# Patient Record
Sex: Female | Born: 1955 | Race: White | Hispanic: No | Marital: Married
Health system: Northeastern US, Academic
[De-identification: ages and names within clinical notes are randomized; demographics above are authoritative.]

## PROBLEM LIST (undated history)

## (undated) DIAGNOSIS — Z800 Family history of malignant neoplasm of digestive organs: Secondary | ICD-10-CM

## (undated) DIAGNOSIS — G894 Chronic pain syndrome: Secondary | ICD-10-CM

## (undated) DIAGNOSIS — Z8041 Family history of malignant neoplasm of ovary: Secondary | ICD-10-CM

## (undated) DIAGNOSIS — F4310 Post-traumatic stress disorder, unspecified: Secondary | ICD-10-CM

## (undated) DIAGNOSIS — F410 Panic disorder [episodic paroxysmal anxiety] without agoraphobia: Secondary | ICD-10-CM

## (undated) DIAGNOSIS — J439 Emphysema, unspecified: Secondary | ICD-10-CM

## (undated) DIAGNOSIS — F339 Major depressive disorder, recurrent, unspecified: Secondary | ICD-10-CM

## (undated) DIAGNOSIS — L409 Psoriasis, unspecified: Secondary | ICD-10-CM

## (undated) DIAGNOSIS — H2510 Age-related nuclear cataract, unspecified eye: Secondary | ICD-10-CM

## (undated) DIAGNOSIS — N390 Urinary tract infection, site not specified: Secondary | ICD-10-CM

## (undated) DIAGNOSIS — H16239 Neurotrophic keratoconjunctivitis, unspecified eye: Secondary | ICD-10-CM

## (undated) DIAGNOSIS — H269 Unspecified cataract: Secondary | ICD-10-CM

## (undated) DIAGNOSIS — E782 Mixed hyperlipidemia: Secondary | ICD-10-CM

## (undated) DIAGNOSIS — F063 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, unspecified: Secondary | ICD-10-CM

## (undated) DIAGNOSIS — M879 Osteonecrosis, unspecified: Secondary | ICD-10-CM

## (undated) DIAGNOSIS — N200 Calculus of kidney: Secondary | ICD-10-CM

## (undated) DIAGNOSIS — M1990 Unspecified osteoarthritis, unspecified site: Secondary | ICD-10-CM

## (undated) DIAGNOSIS — I341 Nonrheumatic mitral (valve) prolapse: Secondary | ICD-10-CM

## (undated) DIAGNOSIS — C50919 Malignant neoplasm of unspecified site of unspecified female breast: Secondary | ICD-10-CM

## (undated) DIAGNOSIS — Z853 Personal history of malignant neoplasm of breast: Secondary | ICD-10-CM

## (undated) DIAGNOSIS — I639 Cerebral infarction, unspecified: Secondary | ICD-10-CM

## (undated) DIAGNOSIS — A6000 Herpesviral infection of urogenital system, unspecified: Secondary | ICD-10-CM

## (undated) DIAGNOSIS — Z803 Family history of malignant neoplasm of breast: Secondary | ICD-10-CM

## (undated) DIAGNOSIS — E559 Vitamin D deficiency, unspecified: Secondary | ICD-10-CM

## (undated) DIAGNOSIS — Z808 Family history of malignant neoplasm of other organs or systems: Secondary | ICD-10-CM

## (undated) DIAGNOSIS — Z8042 Family history of malignant neoplasm of prostate: Secondary | ICD-10-CM

## (undated) DIAGNOSIS — Z96652 Presence of left artificial knee joint: Secondary | ICD-10-CM

## (undated) DIAGNOSIS — H04123 Dry eye syndrome of bilateral lacrimal glands: Secondary | ICD-10-CM

## (undated) DIAGNOSIS — Z8619 Personal history of other infectious and parasitic diseases: Secondary | ICD-10-CM

## (undated) DIAGNOSIS — F411 Generalized anxiety disorder: Secondary | ICD-10-CM

## (undated) DIAGNOSIS — Z8739 Personal history of other diseases of the musculoskeletal system and connective tissue: Secondary | ICD-10-CM

## (undated) HISTORY — DX: Cerebral infarction, unspecified: I63.9

## (undated) HISTORY — DX: Generalized anxiety disorder: F41.1

## (undated) HISTORY — DX: Psoriasis, unspecified: L40.9

## (undated) HISTORY — DX: Family history of malignant neoplasm of prostate: Z80.42

## (undated) HISTORY — DX: Herpesviral infection of urogenital system, unspecified: A60.00

## (undated) HISTORY — PX: AUGMENTATION MAMMAPLASTY: SUR837

## (undated) HISTORY — DX: Chronic pain syndrome: G89.4

## (undated) HISTORY — DX: Neurotrophic keratoconjunctivitis, unspecified eye: H16.239

## (undated) HISTORY — DX: Family history of malignant neoplasm of ovary: Z80.41

## (undated) HISTORY — DX: 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, unspecified: F06.30

## (undated) HISTORY — DX: Dry eye syndrome of bilateral lacrimal glands: H04.123

## (undated) HISTORY — DX: Family history of malignant neoplasm of breast: Z80.3

## (undated) HISTORY — DX: Presence of left artificial knee joint: Z96.652

## (undated) HISTORY — DX: Nonrheumatic mitral (valve) prolapse: I34.1

## (undated) HISTORY — DX: Family history of malignant neoplasm of digestive organs: Z80.0

## (undated) HISTORY — PX: BREAST BIOPSY: SHX20

## (undated) HISTORY — DX: Personal history of malignant neoplasm of breast: Z85.3

## (undated) HISTORY — DX: Personal history of other infectious and parasitic diseases: Z86.19

## (undated) HISTORY — DX: Unspecified cataract: H26.9

## (undated) HISTORY — DX: Major depressive disorder, recurrent, unspecified: F33.9

## (undated) HISTORY — DX: Malignant neoplasm of unspecified site of unspecified female breast: C50.919

## (undated) HISTORY — DX: Urinary tract infection, site not specified: N39.0

## (undated) HISTORY — DX: Vitamin D deficiency, unspecified: E55.9

## (undated) HISTORY — DX: Panic disorder (episodic paroxysmal anxiety): F41.0

## (undated) HISTORY — DX: Personal history of other diseases of the musculoskeletal system and connective tissue: Z87.39

## (undated) HISTORY — DX: Post-traumatic stress disorder, unspecified: F43.10

## (undated) HISTORY — DX: Family history of malignant neoplasm of other organs or systems: Z80.8

## (undated) HISTORY — DX: Age-related nuclear cataract, unspecified eye: H25.10

## (undated) HISTORY — DX: Osteonecrosis, unspecified: M87.9

## (undated) HISTORY — DX: Mixed hyperlipidemia: E78.2

---

## 1993-03-28 DIAGNOSIS — Z853 Personal history of malignant neoplasm of breast: Secondary | ICD-10-CM | POA: Insufficient documentation

## 1993-03-28 HISTORY — PX: MASTECTOMY: SHX3

## 1997-09-01 DIAGNOSIS — Z87898 Personal history of other specified conditions: Secondary | ICD-10-CM | POA: Insufficient documentation

## 1997-10-20 ENCOUNTER — Ambulatory Visit (HOSPITAL_COMMUNITY): Admission: RE | Admit: 1997-10-20 | Discharge: 1997-10-20 | Payer: Self-pay | Admitting: Neurology

## 1997-10-21 ENCOUNTER — Ambulatory Visit (HOSPITAL_COMMUNITY): Admission: RE | Admit: 1997-10-21 | Discharge: 1997-10-21 | Payer: Self-pay

## 1997-10-24 ENCOUNTER — Inpatient Hospital Stay (HOSPITAL_COMMUNITY): Admission: RE | Admit: 1997-10-24 | Discharge: 1997-11-11 | Payer: Self-pay | Admitting: Neurosurgery

## 1997-11-11 ENCOUNTER — Inpatient Hospital Stay (HOSPITAL_COMMUNITY)
Admission: RE | Admit: 1997-11-11 | Discharge: 1997-11-28 | Payer: Self-pay | Admitting: Physical Medicine & Rehabilitation

## 1997-12-02 ENCOUNTER — Encounter
Admission: RE | Admit: 1997-12-02 | Discharge: 1998-03-02 | Payer: Self-pay | Admitting: Physical Medicine & Rehabilitation

## 1998-03-15 ENCOUNTER — Ambulatory Visit (HOSPITAL_COMMUNITY): Admission: RE | Admit: 1998-03-15 | Discharge: 1998-03-15 | Payer: Self-pay | Admitting: Neurology

## 1998-03-15 ENCOUNTER — Encounter: Payer: Self-pay | Admitting: Physical Medicine & Rehabilitation

## 1998-03-17 ENCOUNTER — Encounter: Admission: RE | Admit: 1998-03-17 | Discharge: 1998-06-15 | Payer: Self-pay | Admitting: Neurosurgery

## 1999-11-10 ENCOUNTER — Encounter: Payer: Self-pay | Admitting: Oncology

## 1999-11-10 ENCOUNTER — Encounter: Admission: RE | Admit: 1999-11-10 | Discharge: 1999-11-10 | Payer: Self-pay | Admitting: Oncology

## 2000-05-09 ENCOUNTER — Encounter: Payer: Self-pay | Admitting: Oncology

## 2000-05-09 ENCOUNTER — Encounter: Admission: RE | Admit: 2000-05-09 | Discharge: 2000-05-09 | Payer: Self-pay | Admitting: Oncology

## 2001-03-28 HISTORY — PX: RECONSTRUCTION BREAST W/ TRAM FLAP: SUR1079

## 2001-06-28 ENCOUNTER — Encounter: Payer: Self-pay | Admitting: Oncology

## 2001-06-28 ENCOUNTER — Encounter: Admission: RE | Admit: 2001-06-28 | Discharge: 2001-06-28 | Payer: Self-pay | Admitting: Oncology

## 2001-08-07 ENCOUNTER — Encounter: Admission: RE | Admit: 2001-08-07 | Discharge: 2001-08-07 | Payer: Self-pay | Admitting: Neurology

## 2001-08-07 ENCOUNTER — Encounter: Payer: Self-pay | Admitting: Neurology

## 2001-08-14 ENCOUNTER — Encounter: Payer: Self-pay | Admitting: Surgery

## 2001-08-16 ENCOUNTER — Encounter: Payer: Self-pay | Admitting: Surgery

## 2001-08-16 ENCOUNTER — Inpatient Hospital Stay (HOSPITAL_COMMUNITY): Admission: RE | Admit: 2001-08-16 | Discharge: 2001-08-18 | Payer: Self-pay | Admitting: Surgery

## 2001-11-21 ENCOUNTER — Ambulatory Visit: Admission: RE | Admit: 2001-11-21 | Discharge: 2001-11-21 | Payer: Self-pay | Admitting: Plastic Surgery

## 2001-12-21 ENCOUNTER — Encounter: Payer: Self-pay | Admitting: Plastic Surgery

## 2001-12-21 ENCOUNTER — Inpatient Hospital Stay (HOSPITAL_COMMUNITY): Admission: RE | Admit: 2001-12-21 | Discharge: 2001-12-24 | Payer: Self-pay | Admitting: Plastic Surgery

## 2002-02-27 ENCOUNTER — Other Ambulatory Visit: Admission: RE | Admit: 2002-02-27 | Discharge: 2002-02-27 | Payer: Self-pay | Admitting: Obstetrics & Gynecology

## 2002-10-27 DIAGNOSIS — Z8739 Personal history of other diseases of the musculoskeletal system and connective tissue: Secondary | ICD-10-CM | POA: Insufficient documentation

## 2002-10-27 HISTORY — DX: Personal history of other diseases of the musculoskeletal system and connective tissue: Z87.39

## 2002-10-30 ENCOUNTER — Encounter: Payer: Self-pay | Admitting: Oncology

## 2002-10-30 ENCOUNTER — Encounter: Admission: RE | Admit: 2002-10-30 | Discharge: 2002-10-30 | Payer: Self-pay | Admitting: Oncology

## 2003-02-28 ENCOUNTER — Ambulatory Visit (HOSPITAL_BASED_OUTPATIENT_CLINIC_OR_DEPARTMENT_OTHER): Admission: RE | Admit: 2003-02-28 | Discharge: 2003-02-28 | Payer: Self-pay | Admitting: Orthopedic Surgery

## 2003-03-29 DIAGNOSIS — Z96652 Presence of left artificial knee joint: Secondary | ICD-10-CM | POA: Insufficient documentation

## 2003-03-29 HISTORY — DX: Presence of left artificial knee joint: Z96.652

## 2003-04-02 ENCOUNTER — Other Ambulatory Visit: Admission: RE | Admit: 2003-04-02 | Discharge: 2003-04-02 | Payer: Self-pay | Admitting: Obstetrics & Gynecology

## 2003-12-03 ENCOUNTER — Encounter: Admission: RE | Admit: 2003-12-03 | Discharge: 2003-12-03 | Payer: Self-pay | Admitting: Obstetrics & Gynecology

## 2004-12-29 ENCOUNTER — Other Ambulatory Visit: Admission: RE | Admit: 2004-12-29 | Discharge: 2004-12-29 | Payer: Self-pay | Admitting: Obstetrics & Gynecology

## 2004-12-30 ENCOUNTER — Encounter: Admission: RE | Admit: 2004-12-30 | Discharge: 2004-12-30 | Payer: Self-pay | Admitting: Obstetrics & Gynecology

## 2005-05-23 ENCOUNTER — Encounter: Admission: RE | Admit: 2005-05-23 | Discharge: 2005-05-23 | Payer: Self-pay | Admitting: Orthopedic Surgery

## 2006-01-08 ENCOUNTER — Emergency Department (HOSPITAL_COMMUNITY): Admission: EM | Admit: 2006-01-08 | Discharge: 2006-01-09 | Payer: Self-pay | Admitting: Emergency Medicine

## 2006-04-21 ENCOUNTER — Inpatient Hospital Stay (HOSPITAL_COMMUNITY): Admission: RE | Admit: 2006-04-21 | Discharge: 2006-04-25 | Payer: Self-pay | Admitting: Orthopedic Surgery

## 2010-04-19 ENCOUNTER — Encounter: Payer: Self-pay | Admitting: Obstetrics & Gynecology

## 2010-05-03 ENCOUNTER — Other Ambulatory Visit (HOSPITAL_COMMUNITY)
Admission: RE | Admit: 2010-05-03 | Discharge: 2010-05-03 | Disposition: A | Discharge status: Home / Self Care | Payer: 59 | Source: Ambulatory Visit | Attending: Advanced Practice Midwife | Admitting: Advanced Practice Midwife

## 2010-05-03 ENCOUNTER — Other Ambulatory Visit: Payer: Self-pay | Admitting: Family Medicine

## 2010-05-03 DIAGNOSIS — Z124 Encounter for screening for malignant neoplasm of cervix: Secondary | ICD-10-CM | POA: Insufficient documentation

## 2010-11-08 ENCOUNTER — Other Ambulatory Visit: Payer: Self-pay | Admitting: Family Medicine

## 2011-11-04 ENCOUNTER — Institutional Professional Consult (permissible substitution): Payer: 59 | Admitting: Pulmonary Disease

## 2013-06-10 DIAGNOSIS — H16239 Neurotrophic keratoconjunctivitis, unspecified eye: Secondary | ICD-10-CM | POA: Insufficient documentation

## 2013-06-10 DIAGNOSIS — H2510 Age-related nuclear cataract, unspecified eye: Secondary | ICD-10-CM

## 2013-06-10 HISTORY — DX: Neurotrophic keratoconjunctivitis, unspecified eye: H16.239

## 2013-06-10 HISTORY — DX: Age-related nuclear cataract, unspecified eye: H25.10

## 2014-01-06 ENCOUNTER — Telehealth: Payer: Self-pay | Admitting: *Deleted

## 2014-01-06 NOTE — Telephone Encounter (Signed)
Form,[employer] to [employer] 01-06-14

## 2014-01-09 NOTE — Telephone Encounter (Signed)
I received Forms from Medical Records and I have placed them in the Providers file and have placed them on his assistants In-Box to be completed. 

## 2014-01-13 NOTE — Telephone Encounter (Signed)
Form,[employer] called patient and informed her, will need an appointment before form can be completed 01-13-14.

## 2014-01-13 NOTE — Telephone Encounter (Signed)
Patient called back to schedule appointment with Dr. Jannifer Franklin, prior to having form completed.  Informed patient she hadn't been in office since October 20, 2010 and needed a new referral.

## 2014-01-17 NOTE — Telephone Encounter (Signed)
Spoke to patient and she is scheduled for 01-24-14 at 0830.

## 2014-01-20 ENCOUNTER — Telehealth: Payer: Self-pay | Admitting: *Deleted

## 2014-01-20 NOTE — Telephone Encounter (Signed)
Form,[employer] back to Nurse Butch Penny to be completed 01-20-14.

## 2014-01-23 ENCOUNTER — Encounter: Payer: Self-pay | Admitting: Neurology

## 2014-01-24 ENCOUNTER — Telehealth: Payer: Self-pay | Admitting: Neurology

## 2014-01-24 ENCOUNTER — Ambulatory Visit: Payer: Self-pay | Admitting: Neurology

## 2014-01-24 ENCOUNTER — Encounter: Payer: Self-pay | Admitting: Neurology

## 2014-01-24 NOTE — Telephone Encounter (Signed)
This patient did not show for a new patient appointment. 

## 2014-01-27 ENCOUNTER — Encounter: Payer: Self-pay | Admitting: *Deleted

## 2014-02-12 ENCOUNTER — Encounter: Payer: Self-pay | Admitting: Neurology

## 2014-02-18 ENCOUNTER — Encounter: Payer: Self-pay | Admitting: Neurology

## 2014-06-05 ENCOUNTER — Telehealth: Payer: Self-pay | Admitting: *Deleted

## 2014-06-05 NOTE — Telephone Encounter (Signed)
Patient need an appointment before form can be completed.

## 2014-06-13 ENCOUNTER — Telehealth: Payer: Self-pay | Admitting: *Deleted

## 2014-06-13 NOTE — Telephone Encounter (Signed)
Form,[employer] patient will not need form,Angie refunded money 06-13-14.

## 2015-03-11 ENCOUNTER — Other Ambulatory Visit: Payer: Self-pay | Admitting: Nurse Practitioner

## 2015-03-11 ENCOUNTER — Ambulatory Visit (INDEPENDENT_AMBULATORY_CARE_PROVIDER_SITE_OTHER): Payer: 59 | Admitting: Psychiatry

## 2015-03-11 ENCOUNTER — Encounter (HOSPITAL_COMMUNITY): Payer: Self-pay | Admitting: Psychiatry

## 2015-03-11 VITALS — BP 108/76 | HR 85 | Ht 68.5 in | Wt 174.0 lb

## 2015-03-11 DIAGNOSIS — F339 Major depressive disorder, recurrent, unspecified: Secondary | ICD-10-CM

## 2015-03-11 DIAGNOSIS — F4310 Post-traumatic stress disorder, unspecified: Secondary | ICD-10-CM

## 2015-03-11 DIAGNOSIS — F410 Panic disorder [episodic paroxysmal anxiety] without agoraphobia: Secondary | ICD-10-CM | POA: Diagnosis not present

## 2015-03-11 DIAGNOSIS — F411 Generalized anxiety disorder: Secondary | ICD-10-CM

## 2015-03-11 DIAGNOSIS — Z1231 Encounter for screening mammogram for malignant neoplasm of breast: Secondary | ICD-10-CM

## 2015-03-11 DIAGNOSIS — F063 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, unspecified: Secondary | ICD-10-CM

## 2015-03-11 DIAGNOSIS — E559 Vitamin D deficiency, unspecified: Secondary | ICD-10-CM

## 2015-03-11 HISTORY — DX: Generalized anxiety disorder: F41.1

## 2015-03-11 HISTORY — DX: 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, unspecified: F06.30

## 2015-03-11 HISTORY — DX: Post-traumatic stress disorder, unspecified: F43.10

## 2015-03-11 MED ORDER — ALPRAZOLAM 1 MG PO TABS: 1.0000 mg | ORAL_TABLET | Freq: Four times a day (QID) | ORAL | Status: DC

## 2015-03-11 MED ORDER — METHYLPHENIDATE HCL 5 MG PO TABS: 5.0000 mg | ORAL_TABLET | Freq: Every day | ORAL | Status: DC

## 2015-03-11 MED ORDER — DULOXETINE HCL 30 MG PO CPEP: 90.0000 mg | ORAL_CAPSULE | Freq: Every day | ORAL | Status: DC

## 2015-03-11 MED ORDER — GABAPENTIN 300 MG PO CAPS: 300.0000 mg | ORAL_CAPSULE | Freq: Four times a day (QID) | ORAL | Status: DC
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
## 2015-03-13 DIAGNOSIS — I341 Nonrheumatic mitral (valve) prolapse: Secondary | ICD-10-CM

## 2015-03-13 HISTORY — DX: Nonrheumatic mitral (valve) prolapse: I34.1

## 2015-04-06 ENCOUNTER — Ambulatory Visit (HOSPITAL_COMMUNITY): Payer: Self-pay | Admitting: Psychiatry

## 2015-04-08 ENCOUNTER — Ambulatory Visit (INDEPENDENT_AMBULATORY_CARE_PROVIDER_SITE_OTHER): Payer: 59 | Admitting: Psychiatry

## 2015-04-08 ENCOUNTER — Encounter (HOSPITAL_COMMUNITY): Payer: Self-pay | Admitting: Psychiatry

## 2015-04-08 VITALS — BP 114/75 | HR 100 | Wt 180.6 lb

## 2015-04-08 DIAGNOSIS — F339 Major depressive disorder, recurrent, unspecified: Secondary | ICD-10-CM

## 2015-04-08 DIAGNOSIS — F411 Generalized anxiety disorder: Secondary | ICD-10-CM | POA: Diagnosis not present

## 2015-04-08 DIAGNOSIS — F063 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, unspecified: Secondary | ICD-10-CM | POA: Diagnosis not present

## 2015-04-08 DIAGNOSIS — F410 Panic disorder [episodic paroxysmal anxiety] without agoraphobia: Secondary | ICD-10-CM

## 2015-04-08 DIAGNOSIS — F4310 Post-traumatic stress disorder, unspecified: Secondary | ICD-10-CM

## 2015-04-08 MED ORDER — METHYLPHENIDATE HCL 5 MG PO TABS: 5.0000 mg | ORAL_TABLET | Freq: Two times a day (BID) | ORAL | Status: DC
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
## 2015-04-14 ENCOUNTER — Inpatient Hospital Stay: Admission: RE | Admit: 2015-04-14 | Payer: Self-pay | Source: Ambulatory Visit

## 2015-04-14 ENCOUNTER — Ambulatory Visit: Payer: Self-pay

## 2015-04-29 ENCOUNTER — Ambulatory Visit (HOSPITAL_COMMUNITY): Payer: Self-pay | Admitting: Psychiatry

## 2015-05-06 ENCOUNTER — Encounter (HOSPITAL_COMMUNITY): Payer: Self-pay | Admitting: Psychiatry

## 2015-05-06 ENCOUNTER — Ambulatory Visit (INDEPENDENT_AMBULATORY_CARE_PROVIDER_SITE_OTHER): Payer: 59 | Admitting: Psychiatry

## 2015-05-06 VITALS — BP 104/68 | HR 88 | Ht 68.75 in | Wt 180.2 lb

## 2015-05-06 DIAGNOSIS — F339 Major depressive disorder, recurrent, unspecified: Secondary | ICD-10-CM

## 2015-05-06 DIAGNOSIS — F411 Generalized anxiety disorder: Secondary | ICD-10-CM

## 2015-05-06 DIAGNOSIS — F063 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, unspecified: Secondary | ICD-10-CM | POA: Diagnosis not present

## 2015-05-06 DIAGNOSIS — F4310 Post-traumatic stress disorder, unspecified: Secondary | ICD-10-CM

## 2015-05-06 DIAGNOSIS — F410 Panic disorder [episodic paroxysmal anxiety] without agoraphobia: Secondary | ICD-10-CM

## 2015-05-06 MED ORDER — METHYLPHENIDATE HCL 5 MG PO TABS: 5.0000 mg | ORAL_TABLET | Freq: Two times a day (BID) | ORAL | Status: DC
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
## 2015-05-11 ENCOUNTER — Other Ambulatory Visit: Payer: Self-pay

## 2015-05-11 ENCOUNTER — Ambulatory Visit: Payer: Self-pay

## 2015-06-09 ENCOUNTER — Telehealth (HOSPITAL_COMMUNITY): Payer: Self-pay

## 2015-06-09 DIAGNOSIS — F339 Major depressive disorder, recurrent, unspecified: Secondary | ICD-10-CM

## 2015-06-09 NOTE — Telephone Encounter (Signed)
Patient calling, she had to reschedule her appointment with Dr. Salem Senate, she is coming in on 4/10/ She needs refills on her medications, but uses a mail order and want a 90 day supply sent in. Please review and advise, thank you

## 2015-06-09 NOTE — Telephone Encounter (Signed)
I am not comfortable doing 90 days. At most we can do 30 days.

## 2015-06-10 MED ORDER — ALPRAZOLAM 1 MG PO TABS: 1.0000 mg | ORAL_TABLET | Freq: Four times a day (QID) | ORAL | Status: DC

## 2015-06-10 MED ORDER — DULOXETINE HCL 30 MG PO CPEP: 90.0000 mg | ORAL_CAPSULE | Freq: Every day | ORAL | Status: DC

## 2015-06-10 NOTE — Telephone Encounter (Signed)
Dr. Lovena Le, this patients insurance will not cover her medications if they are not 90 day. She is running low on the Neurontin and Cymbalta - will you approve the 90 day so that the patient does not have to pay out of pocket? Please advise, thank you

## 2015-06-10 NOTE — Telephone Encounter (Signed)
I sent the prescriptions to the pharmacy and called patient to let her know it had been done

## 2015-07-06 ENCOUNTER — Encounter (HOSPITAL_COMMUNITY): Payer: Self-pay | Admitting: Psychiatry

## 2015-07-06 ENCOUNTER — Ambulatory Visit (INDEPENDENT_AMBULATORY_CARE_PROVIDER_SITE_OTHER): Payer: 59 | Admitting: Psychiatry

## 2015-07-06 VITALS — BP 121/72 | HR 110 | Ht 68.75 in | Wt 183.8 lb

## 2015-07-06 DIAGNOSIS — F411 Generalized anxiety disorder: Secondary | ICD-10-CM

## 2015-07-06 DIAGNOSIS — F339 Major depressive disorder, recurrent, unspecified: Secondary | ICD-10-CM

## 2015-07-06 DIAGNOSIS — F410 Panic disorder [episodic paroxysmal anxiety] without agoraphobia: Secondary | ICD-10-CM | POA: Diagnosis not present

## 2015-07-06 DIAGNOSIS — F4310 Post-traumatic stress disorder, unspecified: Secondary | ICD-10-CM

## 2015-07-06 DIAGNOSIS — F063 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, unspecified: Secondary | ICD-10-CM | POA: Diagnosis not present

## 2015-07-06 MED ORDER — DULOXETINE HCL 30 MG PO CPEP: 90.0000 mg | ORAL_CAPSULE | Freq: Every day | ORAL | Status: DC

## 2015-07-06 MED ORDER — GABAPENTIN 300 MG PO CAPS: 300.0000 mg | ORAL_CAPSULE | Freq: Four times a day (QID) | ORAL | Status: DC

## 2015-07-06 MED ORDER — METHYLPHENIDATE HCL 5 MG PO TABS: 5.0000 mg | ORAL_TABLET | Freq: Two times a day (BID) | ORAL | Status: DC

## 2015-07-06 MED ORDER — ALPRAZOLAM 1 MG PO TABS: 1.0000 mg | ORAL_TABLET | Freq: Four times a day (QID) | ORAL | Status: DC
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
## 2015-07-29 ENCOUNTER — Ambulatory Visit (HOSPITAL_COMMUNITY): Payer: Self-pay | Admitting: Clinical

## 2015-08-18 ENCOUNTER — Encounter (HOSPITAL_COMMUNITY): Payer: Self-pay | Admitting: Psychiatry

## 2015-08-18 ENCOUNTER — Ambulatory Visit (INDEPENDENT_AMBULATORY_CARE_PROVIDER_SITE_OTHER): Payer: 59 | Admitting: Psychiatry

## 2015-08-18 VITALS — BP 128/74 | HR 96 | Ht 68.75 in | Wt 184.4 lb

## 2015-08-18 DIAGNOSIS — F410 Panic disorder [episodic paroxysmal anxiety] without agoraphobia: Secondary | ICD-10-CM

## 2015-08-18 DIAGNOSIS — F411 Generalized anxiety disorder: Secondary | ICD-10-CM

## 2015-08-18 MED ORDER — CLONAZEPAM 1 MG PO TABS: ORAL_TABLET | ORAL | Status: DC
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
## 2015-08-25 ENCOUNTER — Telehealth (HOSPITAL_COMMUNITY): Payer: Self-pay

## 2015-08-25 NOTE — Telephone Encounter (Signed)
I tried returning her call and left a message.

## 2015-08-26 ENCOUNTER — Other Ambulatory Visit (HOSPITAL_COMMUNITY): Payer: Self-pay | Admitting: Psychiatry

## 2015-08-26 ENCOUNTER — Telehealth (HOSPITAL_COMMUNITY): Payer: Self-pay

## 2015-08-26 DIAGNOSIS — F411 Generalized anxiety disorder: Secondary | ICD-10-CM

## 2015-08-26 MED ORDER — HYDROXYZINE PAMOATE 100 MG PO CAPS: 100.0000 mg | ORAL_CAPSULE | Freq: Three times a day (TID) | ORAL | Status: DC | PRN

## 2015-08-26 MED ORDER — HYDROXYZINE PAMOATE 25 MG PO CAPS
25.0000 mg | ORAL_CAPSULE | Freq: Three times a day (TID) | ORAL | Status: DC | PRN
Start: 2015-08-26 — End: 2017-09-27

## 2015-08-26 NOTE — Telephone Encounter (Signed)
Patient called back and apologize for her behavior.  She wants to work with this Probation officer so she can get off from Xanax.  We have tried Klonopin but she complaining of swelling.  It is unclear why she have a swelling with a different benzodiazepine.  I recommended to try Vistaril 25 mg 3 times a day to help her anxiety and insomnia and she will cut down her Xanax to 1 mg at bedtime only for 1 week and then 0.5 mg at bedtime.  I recommended to call us back if she has any issues.  We will called Vistaril to her local pharmacy.

## 2015-08-29 ENCOUNTER — Other Ambulatory Visit (HOSPITAL_COMMUNITY): Payer: Self-pay | Admitting: Psychiatry

## 2015-09-04 ENCOUNTER — Other Ambulatory Visit (HOSPITAL_COMMUNITY): Payer: Self-pay

## 2015-09-04 DIAGNOSIS — F339 Major depressive disorder, recurrent, unspecified: Secondary | ICD-10-CM

## 2015-09-04 MED ORDER — DULOXETINE HCL 30 MG PO CPEP: 90.0000 mg | ORAL_CAPSULE | Freq: Every day | ORAL | Status: DC

## 2015-09-04 NOTE — Telephone Encounter (Signed)
Patient is coming in to follow up with Dr Adele Schilder, she is a transfer from Dr. Salem Senate. Fax was received for refill on cymbalta and gabapentin. I called patient and she sad  she was fine on Gabapentin, but needed the cymbalta. Patients insurance will only pay for 90 day supply - this was confirmed with the pharmacy - Dr. Lovena Le approved a 90 day order no refills be sent into patients pharmacy.

## 2015-09-24 NOTE — Telephone Encounter (Signed)
This is your patient.

## 2015-09-27 ENCOUNTER — Other Ambulatory Visit (HOSPITAL_COMMUNITY): Payer: Self-pay | Admitting: Psychiatry

## 2015-09-30 ENCOUNTER — Ambulatory Visit (HOSPITAL_COMMUNITY): Payer: Self-pay | Admitting: Psychiatry

## 2015-10-07 ENCOUNTER — Ambulatory Visit (HOSPITAL_COMMUNITY): Payer: Self-pay | Admitting: Clinical

## 2017-05-22 DIAGNOSIS — M5430 Sciatica, unspecified side: Secondary | ICD-10-CM

## 2017-05-24 DIAGNOSIS — G8929 Other chronic pain: Secondary | ICD-10-CM | POA: Insufficient documentation

## 2017-05-24 DIAGNOSIS — M545 Low back pain, unspecified: Secondary | ICD-10-CM | POA: Insufficient documentation

## 2017-05-30 ENCOUNTER — Encounter (HOSPITAL_COMMUNITY): Payer: Self-pay | Admitting: Emergency Medicine

## 2017-05-30 ENCOUNTER — Emergency Department (HOSPITAL_COMMUNITY): Payer: 59

## 2017-05-30 ENCOUNTER — Other Ambulatory Visit: Payer: Self-pay

## 2017-05-30 ENCOUNTER — Emergency Department (HOSPITAL_COMMUNITY)
Admission: EM | Admit: 2017-05-30 | Discharge: 2017-05-30 | Disposition: A | Discharge status: Home / Self Care | Payer: 59 | Attending: Emergency Medicine | Admitting: Emergency Medicine

## 2017-05-30 DIAGNOSIS — Z79899 Other long term (current) drug therapy: Secondary | ICD-10-CM | POA: Diagnosis not present

## 2017-05-30 DIAGNOSIS — Z853 Personal history of malignant neoplasm of breast: Secondary | ICD-10-CM | POA: Insufficient documentation

## 2017-05-30 DIAGNOSIS — M5416 Radiculopathy, lumbar region: Secondary | ICD-10-CM

## 2017-05-30 DIAGNOSIS — R52 Pain, unspecified: Secondary | ICD-10-CM

## 2017-05-30 DIAGNOSIS — M25552 Pain in left hip: Secondary | ICD-10-CM | POA: Diagnosis present

## 2017-05-30 DIAGNOSIS — F17210 Nicotine dependence, cigarettes, uncomplicated: Secondary | ICD-10-CM | POA: Insufficient documentation

## 2017-05-30 MED ORDER — OXYCODONE-ACETAMINOPHEN 5-325 MG PO TABS
2.0000 | ORAL_TABLET | Freq: Once | ORAL | Status: AC
  Administered 2017-05-30: 2 via ORAL
  Filled 2017-05-30: qty 2

## 2017-05-30 MED ORDER — OXYCODONE-ACETAMINOPHEN 5-325 MG PO TABS: 2.0000 | ORAL_TABLET | ORAL | 0 refills | Status: DC | PRN

## 2017-05-30 NOTE — ED Provider Notes (Signed)
[name] DEPT Provider Note   CSN: [id number] Arrival date & time: 05/30/17  1704     History   Chief Complaint Chief Complaint  Patient presents with  . Leg Pain    HPI   Blood pressure 124/78, pulse 84, temperature 98.6 F (37 C), temperature source Oral, resp. rate 18, SpO2 99 %.  Alyssa Deleon is a 62 y.o. female with past medical history significant for breast cancer complaining of low back pain radiating down to the left gluteus and down the left leg.  This is atraumatic, there is no urinary symptoms.  No fever, chills, numbness or weakness.  She has been ambulatory but with difficulty.  She was seen by her orthopedist and prescribed Percocet, prednisone and Robaxin.  She states that the Percocet is the only thing that helps and she has run out.  He states that she had an appointment with her orthopedic doctor tomorrow at 10:30 AM however they texted her and told her the appointment had been changed to 9 AM, she does not have a ride to see them at that time. Denies fever, chills, change in bowel or bladder habits, h/o IDVU, numbness or weakness.    Past Medical History:  Diagnosis Date  . Anxiety   . 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 Chronic pain leg pain  . Depression   . Stroke [facility])     Patient Active Problem List   Diagnosis Date Noted  . Major depression, recurrent, chronic ([place]) 03/11/2015  . GAD (generalized anxiety disorder) 03/11/2015  . Panic disorder 03/11/2015  . PTSD (post-traumatic stress disorder) 03/11/2015  . Mood disorder in conditions classified elsewhere 03/11/2015    History reviewed. No pertinent surgical history.  OB History    No data available       Home Medications    Prior to Admission medications   Medication Sig Start Date End Date Taking? Authorizing Provider  aspirin 500 MG tablet Take by mouth.    [provider]  Calcium Carbonate-Vitamin D 600-200 MG-UNIT CAPS Take by  mouth 2 (two) times daily.    [provider]  clonazePAM (KLONOPIN) 1 MG tablet Take 1/2 tab during day for pannic attack and 1 tab at bed time 08/18/15   Arfeen, Arlyce Harman, MD  co-enzyme Q-10 50 MG capsule Take 50 mg by mouth daily.    [provider]  cycloSPORINE (RESTASIS) 0.05 % ophthalmic emulsion 1 drop 2 (two) times daily.    [provider]  diclofenac sodium (VOLTAREN) 1 % GEL Apply topically. Reported on 03/11/2015    [provider]  DULoxetine (CYMBALTA) 30 MG capsule Take 3 capsules (90 mg total) by mouth daily. 09/04/15   Clarene Reamer, MD  ezetimibe-simvastatin (VYTORIN) 10-40 MG per tablet Take 1 tablet by mouth daily. One half a tablet once a day.    [provider]  gabapentin (NEURONTIN) 300 MG capsule Take 1 capsule (300 mg total) by mouth 4 (four) times daily. 07/06/15   Leonides Grills, MD  hydrOXYzine (VISTARIL) 25 MG capsule Take 1 capsule (25 mg total) by mouth 3 (three) times daily as needed for anxiety. 08/26/15   Arfeen, Arlyce Harman, MD  oxyCODONE-acetaminophen (PERCOCET) 5-325 MG tablet Take 2 tablets by mouth every 4 (four) hours as needed. 05/30/17   Akiem Urieta, Elmyra Ricks, PA-C    Family History Family History  Problem Relation Age of Onset  . Cancer - Other Mother   . Cancer -  Prostate Father   . Cancer - Other Sister   . Cancer - Other Brother   . Cancer - Other Sister     Social History Social History   Tobacco Use  . Smoking status: Current Every Day Smoker    Packs/day: 0.00    Types: Cigarettes  Substance Use Topics  . Alcohol use: No    Alcohol/week: 0.0 oz  . Drug use: Not on file     Allergies   Hydrocodone-acetaminophen; Oxycodone hcl; Crestor [rosuvastatin]; Klonopin [clonazepam]; Morphine sulfate; Sulfa antibiotics; and Vytorin [ezetimibe-simvastatin]   Review of Systems Review of Systems  A complete review of systems was obtained and all systems are negative except as noted in the HPI and PMH.     Physical Exam Updated Vital Signs BP 137/89 (BP Location: Right Arm)   Pulse 80   Temp 98.6 F (37 C) (Oral)   Resp 18   SpO2 99%   Physical Exam  Constitutional: She appears well-developed and well-nourished.  HENT:  Head: Normocephalic.  Eyes: Conjunctivae are normal.  Neck: Normal range of motion.  Cardiovascular: Normal rate, regular rhythm and intact distal pulses.  Pulmonary/Chest: Effort normal.  Abdominal: Soft. There is no tenderness.  Neurological: She is alert.  No point tenderness to percussion of lumbar spinal processes.  No TTP or paraspinal muscular spasm. Strength is 5 out of 5 to bilateral lower extremities at hip and knee; extensor hallucis longus 5 out of 5. Ankle strength 5 out of 5, no clonus, neurovascularly intact. No saddle anaesthesia. Patellar reflexes are 2+ on the right, left side knee replacement      Psychiatric: She has a normal mood and affect.  Nursing note and vitals reviewed.    ED Treatments / Results  Labs (all labs ordered are listed, but only abnormal results are displayed) Labs Reviewed - No data to display  EKG  EKG Interpretation None       Radiology Dg Lumbar Spine Complete  Result Date: 05/30/2017 CLINICAL DATA:  Low back pain with radiculopathy for 10 days. No trauma. EXAM: LUMBAR SPINE - COMPLETE 4+ VIEW COMPARISON:  None. FINDINGS: Mild osteopenia. Convex left lumbar spine curvature is mild. Extensive surgical changes about the abdomen and pelvis. Sacroiliac joints are symmetric. Maintenance of vertebral body height and alignment. Intervertebral disc heights are maintained. Aortic atherosclerosis. IMPRESSION: No acute osseous abnormality. Aortic Atherosclerosis (ICD10-I70.0). Electronically Signed   By: Abigail Miyamoto M.D.   On: 05/30/2017 20:33   Dg Hip Unilat With Pelvis 2-3 Views Left  Result Date: 05/30/2017 CLINICAL DATA:  Low back and left leg pain for 1.5 weeks. EXAM: DG HIP (WITH OR WITHOUT PELVIS) 2-3V LEFT  COMPARISON:  None. FINDINGS: No acute fracture or hip dislocation is identified. Hip joint space widths are preserved without significant degenerative changes seen. The SI joints are grossly unremarkable. Surgical clips are present in the pelvis bilaterally. IMPRESSION: No acute osseous abnormality or significant left hip arthropathy identified. Electronically Signed   By: Logan Bores M.D.   On: 05/30/2017 20:32    Procedures Procedures (including critical care time)  Medications Ordered in ED Medications  oxyCODONE-acetaminophen (PERCOCET/ROXICET) 5-325 MG per tablet 2 tablet (2 tablets Oral Given 05/30/17 2320)     Initial Impression / Assessment and Plan / ED Course  I have reviewed the triage vital signs and the nursing notes.  Pertinent labs & imaging results that were available during my care of the patient were reviewed by me and considered in my medical  decision making (see chart for details).     Vitals:   05/30/17 1723 05/30/17 2025 05/30/17 2323  BP: (!) 118/47 124/78 137/89  Pulse: 94 84 80  Resp: 16 18 18   Temp: 98.6 F (37 C)    TempSrc: Oral    SpO2: 97% 99% 99%    Medications  oxyCODONE-acetaminophen (PERCOCET/ROXICET) 5-325 MG per tablet 2 tablet (2 tablets Oral Given 05/30/17 2320)    Alyssa Deleon is 62 y.o. female presenting with a lumbar radiculopathy, patient has prior breast cancer, x-ray without signs of pathologic fracture or metastases.  Patient is neurovascularly intact.  It appears that she has ran out of her Percocet, chart review shows that this patient does not have frequent visits for pain related complaints.  I have advised her she can increase her Robaxin to 1000 mg 4 times daily as needed, will write a short prescription for Percocet of advised to follow closely with her orthopedist.  Evaluation does not show pathology that would require ongoing emergent intervention or inpatient treatment. Pt is hemodynamically stable and mentating  appropriately. Discussed findings and plan with patient/guardian, who agrees with care plan. All questions answered. Return precautions discussed and outpatient follow up given.    Final Clinical Impressions(s) / ED Diagnoses   Final diagnoses:  Lumbar radiculopathy    ED Discharge Orders        Ordered    oxyCODONE-acetaminophen (PERCOCET) 5-325 MG tablet  Every 4 hours PRN     05/30/17 2325       Varun Jourdan, Charna Elizabeth 05/30/17 2346    Duffy Bruce, MD 05/31/17 0202

## 2017-05-30 NOTE — Discharge Instructions (Signed)
You can increase her Robaxin to 1000 mg (this is 2 tabs up to 4 times a day as needed.  Take percocet for breakthrough pain, do not drink alcohol, drive, care for children or do other critical tasks while taking percocet.  Please be very careful not to fall! The pain medication puts you at risk for falls. Please rest as much as possible and try to not stay alone.  Please follow with your primary care doctor in the next 2 days for a check-up. They must obtain records for further management.   Do not hesitate to return to the Emergency Department for any new, worsening or concerning symptoms.

## 2017-05-30 NOTE — ED Triage Notes (Addendum)
Pt complaint of left hip pain radiating down left leg since this past weekend. Had xray scheduled tomorrow but it got canceled.

## 2017-07-09 DIAGNOSIS — M25561 Pain in right knee: Secondary | ICD-10-CM | POA: Insufficient documentation

## 2017-08-23 ENCOUNTER — Telehealth: Payer: Self-pay | Admitting: Family Medicine

## 2017-08-23 NOTE — Telephone Encounter (Signed)
Copied from [place] [id number]. Topic: Quick Communication - See Telephone Encounter >> Aug 23, 2017 10:17 AM Cleaster Corin, NT wrote: CRM for notification. See Telephone encounter for: 08/23/17.  Pt. Would like to have new pt. Paper work mailed to her if possible   [place] [address] [place] [id number]

## 2017-08-23 NOTE — Telephone Encounter (Signed)
Please mail patient NP paperwork

## 2017-09-27 ENCOUNTER — Ambulatory Visit: Payer: 59 | Admitting: Family Medicine

## 2017-09-27 ENCOUNTER — Encounter: Payer: Self-pay | Admitting: Family Medicine

## 2017-09-27 VITALS — BP 132/74 | HR 82 | Temp 98.8°F | Ht 68.5 in | Wt 204.8 lb

## 2017-09-27 DIAGNOSIS — Z23 Encounter for immunization: Secondary | ICD-10-CM | POA: Diagnosis not present

## 2017-09-27 DIAGNOSIS — E782 Mixed hyperlipidemia: Secondary | ICD-10-CM | POA: Diagnosis not present

## 2017-09-27 DIAGNOSIS — F410 Panic disorder [episodic paroxysmal anxiety] without agoraphobia: Secondary | ICD-10-CM

## 2017-09-27 DIAGNOSIS — F339 Major depressive disorder, recurrent, unspecified: Secondary | ICD-10-CM

## 2017-09-27 DIAGNOSIS — E559 Vitamin D deficiency, unspecified: Secondary | ICD-10-CM | POA: Diagnosis not present

## 2017-09-27 DIAGNOSIS — H04123 Dry eye syndrome of bilateral lacrimal glands: Secondary | ICD-10-CM

## 2017-09-27 DIAGNOSIS — F063 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, unspecified: Secondary | ICD-10-CM

## 2017-09-27 DIAGNOSIS — R635 Abnormal weight gain: Secondary | ICD-10-CM

## 2017-09-27 DIAGNOSIS — R5383 Other fatigue: Secondary | ICD-10-CM

## 2017-09-27 DIAGNOSIS — R5381 Other malaise: Secondary | ICD-10-CM | POA: Diagnosis not present

## 2017-09-27 DIAGNOSIS — Z1211 Encounter for screening for malignant neoplasm of colon: Secondary | ICD-10-CM

## 2017-09-27 DIAGNOSIS — Z853 Personal history of malignant neoplasm of breast: Secondary | ICD-10-CM

## 2017-09-27 DIAGNOSIS — I341 Nonrheumatic mitral (valve) prolapse: Secondary | ICD-10-CM

## 2017-09-27 DIAGNOSIS — Z9189 Other specified personal risk factors, not elsewhere classified: Secondary | ICD-10-CM

## 2017-09-27 DIAGNOSIS — Z8673 Personal history of transient ischemic attack (TIA), and cerebral infarction without residual deficits: Secondary | ICD-10-CM

## 2017-09-27 DIAGNOSIS — Z1159 Encounter for screening for other viral diseases: Secondary | ICD-10-CM

## 2017-09-27 DIAGNOSIS — Z114 Encounter for screening for human immunodeficiency virus [HIV]: Secondary | ICD-10-CM

## 2017-09-27 DIAGNOSIS — G894 Chronic pain syndrome: Secondary | ICD-10-CM

## 2017-09-27 DIAGNOSIS — Z87898 Personal history of other specified conditions: Secondary | ICD-10-CM

## 2017-09-27 LAB — T4, FREE: Free T4: 0.91 ng/dL (ref 0.60–1.60)

## 2017-09-27 LAB — TSH: TSH: 0.64 u[IU]/mL (ref 0.35–4.50)

## 2017-09-27 LAB — VITAMIN D 25 HYDROXY (VIT D DEFICIENCY, FRACTURES): VITD: 6.41 ng/mL — ABNORMAL LOW (ref 30.00–100.00)

## 2017-09-27 LAB — CBC WITH DIFFERENTIAL/PLATELET
Basophils Absolute: 0 10*3/uL (ref 0.0–0.1)
Basophils Relative: 0.6 % (ref 0.0–3.0)
Eosinophils Absolute: 0.1 10*3/uL (ref 0.0–0.7)
Eosinophils Relative: 1.7 % (ref 0.0–5.0)
HCT: 41 % (ref 36.0–46.0)
Hemoglobin: 13.5 g/dL (ref 12.0–15.0)
Lymphocytes Relative: 22.4 % (ref 12.0–46.0)
Lymphs Abs: 1.5 10*3/uL (ref 0.7–4.0)
MCHC: 33 g/dL (ref 30.0–36.0)
MCV: 90.5 fl (ref 78.0–100.0)
Monocytes Absolute: 0.4 10*3/uL (ref 0.1–1.0)
Monocytes Relative: 6.2 % (ref 3.0–12.0)
Neutro Abs: 4.6 10*3/uL (ref 1.4–7.7)
Neutrophils Relative %: 69.1 % (ref 43.0–77.0)
Platelets: 287 10*3/uL (ref 150.0–400.0)
RBC: 4.53 Mil/uL (ref 3.87–5.11)
RDW: 14.4 % (ref 11.5–15.5)
WBC: 6.6 10*3/uL (ref 4.0–10.5)

## 2017-09-27 LAB — LIPID PANEL
Cholesterol: 287 mg/dL — ABNORMAL HIGH (ref 0–200)
HDL: 70.9 mg/dL (ref >39.00)
LDL Cholesterol: 200 mg/dL — ABNORMAL HIGH (ref 0–99)
NonHDL: 215.92
Total CHOL/HDL Ratio: 4
Triglycerides: 79 mg/dL (ref 0.0–149.0)
VLDL: 15.8 mg/dL (ref 0.0–40.0)

## 2017-09-27 LAB — VITAMIN B12: Vitamin B-12: 233 pg/mL (ref 211–911)

## 2017-09-27 LAB — HEMOGLOBIN A1C: Hgb A1c MFr Bld: 5.7 % (ref 4.6–6.5)

## 2017-09-27 MED ORDER — SIMVASTATIN 40 MG PO TABS: 40.0000 mg | ORAL_TABLET | Freq: Every day | ORAL | 3 refills | Status: DC

## 2017-09-27 NOTE — Progress Notes (Signed)
Alyssa Deleon is a 62 y.o. female is here to [place].   Patient Care Team: Briscoe Deutscher, DO as PCP - General (Family Medicine) Ambrose Finland, MD as Consulting Physician (Psychiatry) Teodoro Spray, OD (Optometry)   History of Present Illness:   Alyssa Deleon, CMA acting as scribe for Briscoe Deutscher, DO  HPI:  Patient comes in today to establish care.    Memory Loss:  Patient asks if Alzheimer's disease "runs in families".  She states her mother and her grandmother both had Alzheimer's.  She also is concerned because she has some difficulty with memory at times.  She also has history of a brain tumor and a stroke, so she feels her memory trouble may be attributed to those.  Depression:  Patient states she developed severe depression after having her stroke.  She had severe weight loss and could barely get out of bed.  She was started on medications and states she tried to come off the medications at one point.  She was unable to come off the medications.  She is seeing a psychiatrist who is prescribing her medications and she feels that she is well controlled.  She takes alprazolam at night.  Chronic pain:  She takes gabapentin for nerve pain that she has suffered since having her stroke.  She also takes aspirin for her pain.  She does not take the aspirin everyday.  She has pain in bilateral hips all the way down both legs.  She states she has had this pain for many years.  She also has pain in her knees and has history of a left knee replacement.  She states that she is upset that it is so difficult for people who really need pain medication to get it due to the opioid crisis.  She has never been to a pain clinic and states she has never been given the opportunity to go to a pain clinic.  She states that in the past she would take oxycodone 5-325 two tablets, and that would take care of her pain.  History of Cancer:  She had a lumpectomy in her left breast in 1995 and  then in 2003 had a mastectomy with tram flap repair.  She also had a benign brain tumor.    Fatigue:  Patient states she has no energy.  She tells herself that she needs to get up and do things during the day, such as the dishes and such, but she doesn't have the energy to do them.  Health Maintenance Due  Topic Date Due  . MAMMOGRAM  11/08/2003  . COLONOSCOPY  11/08/2003  . PAP SMEAR  05/03/2013   No flowsheet data found.  PMHx, SurgHx, SocialHx, Medications, and Allergies were reviewed in the Visit Navigator and updated as appropriate.   Past Medical History:  Diagnosis Date  . Breast cancer ([place])   . Cataract, left 09/29/2017  . Chronic pain syndrome, on Neurontin 09/30/2017  . Dry eye syndrome of both eyes, on Restasis 09/30/2017  . Frequent urinary tract infections   . GAD (generalized anxiety disorder) 03/11/2015  . Genital herpes   . History of chicken pox   . History of degenerative disc disease 10/27/2002  . 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 Major depression, recurrent, chronic ([place]), followed by Psych, on Wellbutrin, Cymbalta 09/30/2017  . Mixed hyperlipidemia, on Zetia and Zocor 09/30/2017  . 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 MVP (mitral valve prolapse) 03/13/2015  . 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 Nuclear sclerosis 06/10/2013  . Panic disorder, followed by Psych, on Xanax 09/30/2017  . Psoriasis   . PTSD (post-traumatic stress disorder) 03/11/2015  . Stroke ([place])   . 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 Vitamin D deficiency 09/30/2017     Past Surgical History:  Procedure Laterality Date  . BREAST BIOPSY    . MASTECTOMY Left 1995  . RECONSTRUCTION BREAST W/ TRAM FLAP Left 2003     Family History  Problem Relation Age of Onset  . Cancer - Other Mother   . Miscarriages / [place] Mother   . Alzheimer's disease Mother   . Breast cancer Mother   . Skin cancer Mother   . Cancer - Prostate Father   . Hyperlipidemia  Father   . Throat cancer Father   . Cancer - Other Sister   . Depression Sister   . Hearing loss Sister   . Hyperlipidemia Sister   . Breast cancer Sister   . Skin cancer Sister   . Cancer - Other Brother   . Throat cancer Brother   . Cancer - Other Sister   . Breast cancer Maternal Grandmother   . Ovarian cancer Maternal Grandmother   . Alzheimer's disease Maternal Grandfather   . Heart attack Paternal Grandfather     Social History   Tobacco Use  . Smoking status: Current Every Day Smoker    Packs/day: 0.00    Types: Cigarettes  . Smokeless tobacco: Never Used  Substance Use Topics  . Alcohol use: No    Alcohol/week: 0.0 oz  . Drug use: Never    Current Medications and Allergies:   Current Outpatient Medications:  .  ALPRAZolam (XANAX) 1 MG tablet, Take 3 mg by mouth at bedtime as needed for anxiety., Disp: , Rfl:  .  aspirin 500 MG tablet, Take by mouth., Disp: , Rfl:  .  buPROPion (WELLBUTRIN XL) 300 MG 24 hr tablet, Take 300 mg by mouth daily., Disp: , Rfl:  .  cycloSPORINE (RESTASIS) 0.05 % ophthalmic emulsion, 1 drop 2 (two) times daily., Disp: , Rfl:  .  DULoxetine (CYMBALTA) 60 MG capsule, Take 60 mg by mouth daily., Disp: , Rfl:  .  ezetimibe (ZETIA) 10 MG tablet, Take 10 mg by mouth daily., Disp: , Rfl:  .  gabapentin (NEURONTIN) 300 MG capsule, Take 1 capsule (300 mg total) by mouth 4 (four) times daily. (Patient taking differently: Take 300 mg by mouth every morning. ), Disp: 460 capsule, Rfl: 0 .  simvastatin (ZOCOR) 40 MG tablet, Take 1 tablet (40 mg total) by mouth daily., Disp: 90 tablet, Rfl: 3 .  Cholecalciferol 50000 units TABS, 50,000 units PO qwk for 12 weeks., Disp: 12 tablet, Rfl: 0   Allergies  Allergen Reactions  . Hydrocodone-Acetaminophen Shortness Of Breath  . Klonopin [Clonazepam] Swelling  . Oxycodone Hcl   . Crestor [Rosuvastatin] Other (See Comments)    Myalgias  . Morphine Sulfate Itching  . Sulfa Antibiotics Diarrhea  . Vytorin  [Ezetimibe-Simvastatin] Other (See Comments)    Myalgias   Review of Systems:   Pertinent items are noted in the HPI. Otherwise, ROS is negative.  Vitals:   Vitals:   09/27/17 1322  BP: 132/74  Pulse: 82  Temp: 98.8 F (37.1 C)  TempSrc: Oral  SpO2: 96%  Weight: 204 lb 12.8 oz (92.9 kg)  Height: 5' 8.5" (1.74 m)     Body mass index is 30.69 kg/m.  Physical Exam:  Physical Exam  Constitutional: She is oriented to person, place, and time. She appears well-developed and well-nourished. No distress.  HENT:  Head: Normocephalic and atraumatic.  Right Ear: External ear normal.  Left Ear: External ear normal.  Nose: Nose normal.  Mouth/Throat: Oropharynx is clear and moist.  Eyes: Pupils are equal, round, and reactive to light. Conjunctivae and EOM are normal.  Neck: Normal range of motion. Neck supple. No thyromegaly present.  Cardiovascular: Normal rate, regular rhythm, normal heart sounds and intact distal pulses.  Pulmonary/Chest: Effort normal and breath sounds normal.  Abdominal: Soft. Bowel sounds are normal.  Musculoskeletal: Normal range of motion.       Legs: Left knee s/p replacement. Right knee ttp medial and lateral joint lines.  Lymphadenopathy:    She has no cervical adenopathy.  Neurological: She is alert and oriented to person, place, and time.  Skin: Skin is warm and dry. Capillary refill takes less than 2 seconds.  Psychiatric: She has a normal mood and affect. Her behavior is normal.  Nursing note and vitals reviewed.   Results for orders placed or performed in visit on 09/27/17  CBC with Differential/Platelet  Result Value Ref Range   WBC 6.6 4.0 - 10.5 K/uL   RBC 4.53 3.87 - 5.11 Mil/uL   Hemoglobin 13.5 12.0 - 15.0 g/dL   HCT 41.0 36.0 - 46.0 %   MCV 90.5 78.0 - 100.0 fl   MCHC 33.0 30.0 - 36.0 g/dL   RDW 14.4 11.5 - 15.5 %   Platelets 287.0 150.0 - 400.0 K/uL   Neutrophils Relative % 69.1 43.0 - 77.0 %   Lymphocytes Relative 22.4 12.0 -  46.0 %   Monocytes Relative 6.2 3.0 - 12.0 %   Eosinophils Relative 1.7 0.0 - 5.0 %   Basophils Relative 0.6 0.0 - 3.0 %   Neutro Abs 4.6 1.4 - 7.7 K/uL   Lymphs Abs 1.5 0.7 - 4.0 K/uL   Monocytes Absolute 0.4 0.1 - 1.0 K/uL   Eosinophils Absolute 0.1 0.0 - 0.7 K/uL   Basophils Absolute 0.0 0.0 - 0.1 K/uL  Lipid panel  Result Value Ref Range   Cholesterol 287 (H) 0 - 200 mg/dL   Triglycerides 79.0 0.0 - 149.0 mg/dL   HDL 70.90 >39.00 mg/dL   VLDL 15.8 0.0 - 40.0 mg/dL   LDL Cholesterol 200 (H) 0 - 99 mg/dL   Total CHOL/HDL Ratio 4    NonHDL 215.92   VITAMIN D 25 Hydroxy (Vit-D Deficiency, Fractures)  Result Value Ref Range   VITD 6.41 (L) 30.00 - 100.00 ng/mL  Vitamin B12  Result Value Ref Range   Vitamin B-12 233 211 - 911 pg/mL  TSH  Result Value Ref Range   TSH 0.64 0.35 - 4.50 uIU/mL  T4, free  Result Value Ref Range   Free T4 0.91 0.60 - 1.60 ng/dL  Hemoglobin A1c  Result Value Ref Range   Hgb A1c MFr Bld 5.7 4.6 - 6.5 %  Hepatitis C antibody  Result Value Ref Range   Hepatitis C Ab NON-REACTIVE NON-REACTI   SIGNAL TO CUT-OFF 0.01 <1.00  HIV antibody  Result Value Ref Range   HIV 1&2 Ab, 4th Generation NON-REACTIVE NON-REACTI    Assessment and Plan:   [place] was seen today for establish care.  Diagnoses and all orders for this visit:  Mixed hyperlipidemia, on Zetia and Zocor -     Lipid panel -     simvastatin (ZOCOR) 40 MG tablet; Take 1  tablet (40 mg total) by mouth daily.  Malaise and fatigue -     CBC with Differential/Platelet -     Vitamin B12 -     TSH -     T4, free -     Hemoglobin A1c  Weight gain -     Vitamin B12 -     TSH -     T4, free -     Hemoglobin A1c  History of breast cancer  Chronic pain syndrome, on Neurontin Comments: Neurontin prescribed by Psych, also for mood stabilizer.  Screening for malignant neoplasm of colon -     Ambulatory referral to Gastroenterology  Need for Tdap vaccination -     Tdap vaccine  greater than or equal to 7yo IM  Vitamin D deficiency -     VITAMIN D 25 Hydroxy (Vit-D Deficiency, Fractures) -     Cholecalciferol 50000 units TABS; 50,000 units PO qwk for 12 weeks.  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 Reviewed expectations re: course of current medical issues. . Discussed self-management of symptoms. . Outlined signs and symptoms indicating need for more acute intervention. . Patient verbalized understanding and all questions were answered. Marland Kitchen Health Maintenance issues including appropriate healthy diet, exercise, and smoking avoidance were discussed with patient. . See orders for this visit as documented in the electronic medical record. . Patient received an After Visit Summary.  CMA served as Education administrator during this visit. History, Physical, and Plan performed by medical provider. The above documentation has been reviewed and is accurate and complete. Briscoe Deutscher, D.O.  Records requested if needed. Time spent with the patient: 45 minutes, of which >50% was spent in obtaining information about her symptoms, reviewing her previous labs, evaluations, and treatments, counseling her about her condition (please see the discussed topics above), and developing a plan to further investigate it; she had a number of questions which I addressed.   Briscoe Deutscher, DO [place], [place] [facility] 09/30/2017

## 2017-09-28 LAB — HEPATITIS C ANTIBODY
Hepatitis C Ab: NONREACTIVE
SIGNAL TO CUT-OFF: 0.01 (ref <1.00)

## 2017-09-28 LAB — HIV ANTIBODY (ROUTINE TESTING W REFLEX): HIV 1&2 Ab, 4th Generation: NONREACTIVE

## 2017-09-29 DIAGNOSIS — M879 Osteonecrosis, unspecified: Secondary | ICD-10-CM | POA: Insufficient documentation

## 2017-09-29 DIAGNOSIS — H269 Unspecified cataract: Secondary | ICD-10-CM | POA: Insufficient documentation

## 2017-09-29 HISTORY — DX: Unspecified cataract: H26.9

## 2017-09-29 HISTORY — DX: Osteonecrosis, unspecified: M87.9

## 2017-09-30 ENCOUNTER — Encounter: Payer: Self-pay | Admitting: Family Medicine

## 2017-09-30 DIAGNOSIS — E782 Mixed hyperlipidemia: Secondary | ICD-10-CM

## 2017-09-30 DIAGNOSIS — H04123 Dry eye syndrome of bilateral lacrimal glands: Secondary | ICD-10-CM

## 2017-09-30 DIAGNOSIS — Z8673 Personal history of transient ischemic attack (TIA), and cerebral infarction without residual deficits: Secondary | ICD-10-CM | POA: Insufficient documentation

## 2017-09-30 DIAGNOSIS — F410 Panic disorder [episodic paroxysmal anxiety] without agoraphobia: Secondary | ICD-10-CM

## 2017-09-30 DIAGNOSIS — F339 Major depressive disorder, recurrent, unspecified: Secondary | ICD-10-CM | POA: Insufficient documentation

## 2017-09-30 DIAGNOSIS — E559 Vitamin D deficiency, unspecified: Secondary | ICD-10-CM | POA: Insufficient documentation

## 2017-09-30 DIAGNOSIS — G894 Chronic pain syndrome: Secondary | ICD-10-CM

## 2017-09-30 HISTORY — DX: Vitamin D deficiency, unspecified: E55.9

## 2017-09-30 HISTORY — DX: Mixed hyperlipidemia: E78.2

## 2017-09-30 HISTORY — DX: Panic disorder (episodic paroxysmal anxiety): F41.0

## 2017-09-30 HISTORY — DX: Dry eye syndrome of bilateral lacrimal glands: H04.123

## 2017-09-30 HISTORY — DX: Major depressive disorder, recurrent, unspecified: F33.9

## 2017-09-30 HISTORY — DX: Chronic pain syndrome: G89.4

## 2017-09-30 MED ORDER — CHOLECALCIFEROL 1.25 MG (50000 UT) PO TABS: ORAL_TABLET | ORAL | 0 refills | Status: DC

## 2017-10-03 ENCOUNTER — Other Ambulatory Visit: Payer: Self-pay

## 2017-10-03 DIAGNOSIS — E559 Vitamin D deficiency, unspecified: Secondary | ICD-10-CM

## 2017-10-03 MED ORDER — CHOLECALCIFEROL 1.25 MG (50000 UT) PO TABS: ORAL_TABLET | ORAL | 0 refills | Status: DC

## 2017-10-23 ENCOUNTER — Other Ambulatory Visit: Payer: Self-pay | Admitting: Family Medicine

## 2017-10-23 DIAGNOSIS — Z1231 Encounter for screening mammogram for malignant neoplasm of breast: Secondary | ICD-10-CM

## 2017-10-29 NOTE — Progress Notes (Signed)
Alyssa Deleon is a 62 y.o. female is here for follow up.  History of Present Illness:  [name] CMA acting as scribe for Dr. [name].  HPI: Patient comes in today for follow up on her labs.   Eczema: Patient has eczema on both hands. This is not treated.   Diarrhea: Patient stated that she started taking the high dose vitamin D on Saturdays. When she started taking this she started having diarrhea. She stated that she does not want to take this anymore. She will start taking Vitamin D 1000 units daily.   Breast cancer: Patient has scheduled her mammogram appointment for September. She has a family history of breast cancer. She has a sister that just was diagnosed with breast cancer for the second time.   Pain management: Patient has been seeing an orthopedic doctor. She stated that she saw him last for sciatic nerve. Dr. [name] will prescribe the Oxycodone 5-325 twenty a month.   Stress: Patient is under a lot of stress due to family issues. She has a son that owes her a lot of money due to her helping them get out of trouble. She is in a financial struggle now.    Balance: Patient is having trouble with balance. When she gets up she is off balance. We will refer to PT here at [facility].   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 She has a history of Cataract in this eye.   Panic attack: When patient has a panic attack she has some chest pain. Patient stated that she is afraid she will not know if she has any heart symptoms.   Health Maintenance Due  Topic Date Due  . MAMMOGRAM  11/08/2003  . COLONOSCOPY  11/08/2003  . PAP SMEAR  05/03/2013  . INFLUENZA VACCINE  10/26/2017   No flowsheet data found.   PMHx, SurgHx, SocialHx, FamHx, Medications, and Allergies were reviewed in the Visit Navigator and updated as appropriate.   Patient Active Problem List   Diagnosis Date Noted  . Mixed hyperlipidemia, on Zetia and Zocor 09/30/2017  . Chronic  pain syndrome, on Neurontin 09/30/2017  . Vitamin D deficiency 09/30/2017  . Major depression, recurrent, chronic ([place]), followed by Psych, on Wellbutrin, Cymbalta 09/30/2017  . Panic disorder, followed by Psych, on Xanax 09/30/2017  . Dry eye syndrome of both eyes, on Restasis 09/30/2017  . History of stroke, on ASA 09/30/2017  . Cataract, left 09/29/2017  . Knee osteonecrosis, left ([place]) 09/29/2017  . Sciatica 05/22/2017  . MVP (mitral valve prolapse) 03/13/2015  . GAD (generalized anxiety disorder) 03/11/2015  . PTSD (post-traumatic stress disorder) 03/11/2015  . 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 Nuclear sclerosis 06/10/2013  . 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 History of degenerative disc disease 10/27/2002  . History of brain tumor 09/01/1997  . History of breast cancer 03/28/1993   Social History   Tobacco Use  . Smoking status: Current Every Day Smoker    Packs/day: 0.00    Types: Cigarettes  . Smokeless tobacco: Never Used  Substance Use Topics  . Alcohol use: No    Alcohol/week: 0.0 oz  . Drug use: Never   Current Medications and Allergies:   .  ALPRAZolam (XANAX) 1 MG tablet, Take 3 mg by mouth at bedtime as needed for anxiety., Disp: , Rfl:  .  aspirin 500 MG tablet, Take by mouth., Disp: ,  Rfl:  .  buPROPion (WELLBUTRIN XL) 300 MG 24 hr tablet, Take 300 mg by mouth daily., Disp: , Rfl:  .  Cholecalciferol 50000 units TABS, 50,000 units PO qwk for 12 weeks., Disp: 12 tablet, Rfl: 0 .  cycloSPORINE (RESTASIS) 0.05 % ophthalmic emulsion, 1 drop 2 (two) times daily., Disp: , Rfl:  .  DULoxetine (CYMBALTA) 60 MG capsule, Take 60 mg by mouth daily., Disp: , Rfl:  .  ezetimibe (ZETIA) 10 MG tablet, Take 10 mg by mouth daily., Disp: , Rfl:  .  gabapentin (NEURONTIN) 300 MG capsule, Take 1 capsule (300 mg total) by mouth 4 (four) times daily. (Patient taking differently: Take 300 mg by mouth  every morning. ), Disp: 460 capsule, Rfl: 0 .  simvastatin (ZOCOR) 40 MG tablet, Take 1 tablet (40 mg total) by mouth daily., Disp: 90 tablet, Rfl: 3   Allergies  Allergen Reactions  . Hydrocodone-Acetaminophen Shortness Of Breath  . Klonopin [Clonazepam] Swelling  . Oxycodone Hcl   . Crestor [Rosuvastatin] Other (See Comments)    Myalgias  . Morphine Sulfate Itching  . Sulfa Antibiotics Diarrhea  . Vytorin [Ezetimibe-Simvastatin] Other (See Comments)    Myalgias   Review of Systems   Pertinent items are noted in the HPI. Otherwise, ROS is negative.  Vitals:   Vitals:   10/30/17 1305 10/30/17 1344  BP: 94/68 100/68  Pulse: (!) 105   Temp: 98.2 F (36.8 C)   TempSrc: Oral   SpO2: 98%   Weight: 205 lb 6.4 oz (93.2 kg)   Height: 5' 8.5" (1.74 m)      Body mass index is 30.78 kg/m.  Physical Exam:   Physical Exam  Constitutional: She appears well-nourished.  HENT:  Head: Normocephalic and atraumatic.  Eyes: Pupils are equal, round, and reactive to light. EOM are normal.  Neck: Normal range of motion. Neck supple.  Cardiovascular: Normal rate, regular rhythm, normal heart sounds and intact distal pulses.  Pulmonary/Chest: Effort normal.  Abdominal: Soft.  Skin: Skin is warm.  Psychiatric: She has a normal mood and affect. Her behavior is normal.  Nursing note and vitals reviewed.   Assessment and Plan:   Crystie was seen today for follow-up.  Diagnoses and all orders for this visit:  Malaise and fatigue -     cyanocobalamin ((VITAMIN B-12)) injection 1,000 mcg  Balance problem -     Ambulatory referral to Physical Therapy  Sciatica of right side Comments: Okay limited supply monthly. No refills. Reviewed database without concerns. Drug test at next visit. Orders: -     oxyCODONE-acetaminophen (PERCOCET/ROXICET) 5-325 MG tablet; Take 1 tablet by mouth every 8 (eight) hours as needed for severe pain. -     oxyCODONE-acetaminophen (PERCOCET/ROXICET) 5-325  MG tablet; Take 1 tablet by mouth every 4 (four) hours as needed for severe pain. -     oxyCODONE-acetaminophen (PERCOCET/ROXICET) 5-325 MG tablet; Take 1 tablet by mouth every 4 (four) hours as needed for severe pain.    . Reviewed expectations re: course of current medical issues. . Discussed self-management of symptoms. . Outlined signs and symptoms indicating need for more acute intervention. . Patient verbalized understanding and all questions were answered. Marland Kitchen Health Maintenance issues including appropriate healthy diet, exercise, and smoking avoidance were discussed with patient. . See orders for this visit as documented in the electronic medical record. . Patient received an After Visit Summary.  Briscoe Deutscher, DO [place] 11/01/2017  Future Appointments  Date Time Provider Department  Center  11/15/2017 12:40 PM [facility]   CMA served as scribe during this visit. History, Physical, and Plan performed by medical provider. The above documentation has been reviewed and is accurate and complete. Briscoe Deutscher, D.O.

## 2017-10-30 ENCOUNTER — Encounter: Payer: Self-pay | Admitting: Family Medicine

## 2017-10-30 ENCOUNTER — Ambulatory Visit: Payer: 59 | Admitting: Family Medicine

## 2017-10-30 ENCOUNTER — Telehealth: Payer: Self-pay | Admitting: *Deleted

## 2017-10-30 VITALS — BP 100/68 | HR 105 | Temp 98.2°F | Ht 68.5 in | Wt 205.4 lb

## 2017-10-30 DIAGNOSIS — R5381 Other malaise: Secondary | ICD-10-CM | POA: Diagnosis not present

## 2017-10-30 DIAGNOSIS — R2689 Other abnormalities of gait and mobility: Secondary | ICD-10-CM | POA: Diagnosis not present

## 2017-10-30 DIAGNOSIS — R5383 Other fatigue: Secondary | ICD-10-CM | POA: Diagnosis not present

## 2017-10-30 DIAGNOSIS — M5431 Sciatica, right side: Secondary | ICD-10-CM | POA: Diagnosis not present

## 2017-10-30 MED ORDER — OXYCODONE-ACETAMINOPHEN 5-325 MG PO TABS: 1.0000 | ORAL_TABLET | Freq: Three times a day (TID) | ORAL | 0 refills | Status: DC | PRN

## 2017-10-30 MED ORDER — OXYCODONE-ACETAMINOPHEN 5-325 MG PO TABS: 1.0000 | ORAL_TABLET | ORAL | 0 refills | Status: AC | PRN

## 2017-10-30 MED ORDER — OXYCODONE-ACETAMINOPHEN 5-325 MG PO TABS: 1.0000 | ORAL_TABLET | ORAL | 0 refills | Status: DC | PRN

## 2017-10-30 MED ORDER — CYANOCOBALAMIN 1000 MCG/ML IJ SOLN
1000.0000 ug | Freq: Once | INTRAMUSCULAR | Status: AC
  Administered 2017-10-30: 1000 ug via INTRAMUSCULAR

## 2017-10-30 NOTE — Telephone Encounter (Signed)
Copied from [place] [id number]. Topic: General - Other >> Oct 30, 2017  3:02 PM Mcneil, Ja-Kwan wrote: Reason for CRM: Pt states she was told she would be given something for the Vitamin D deficiency but there was nothing called in to her pharmacy. Pt requests a call back. Cb# [phone]

## 2017-10-30 NOTE — Telephone Encounter (Signed)
Spoke with the patient and let her know that she should be taking OTC vitamin D 1000-2000 units daily. Patient verbalized understanding.

## 2017-10-31 ENCOUNTER — Encounter: Payer: Self-pay | Admitting: Family Medicine

## 2017-11-15 ENCOUNTER — Ambulatory Visit
Admission: RE | Admit: 2017-11-15 | Discharge: 2017-11-15 | Disposition: A | Discharge status: Home / Self Care | Payer: 59 | Source: Ambulatory Visit | Attending: Family Medicine | Admitting: Family Medicine

## 2017-11-15 ENCOUNTER — Other Ambulatory Visit: Payer: Self-pay | Admitting: Family Medicine

## 2017-11-15 DIAGNOSIS — Z1231 Encounter for screening mammogram for malignant neoplasm of breast: Secondary | ICD-10-CM

## 2017-12-30 ENCOUNTER — Encounter: Payer: Self-pay | Admitting: Family Medicine

## 2018-01-30 ENCOUNTER — Encounter: Payer: Self-pay | Admitting: Family Medicine

## 2018-01-30 ENCOUNTER — Ambulatory Visit: Payer: 59 | Admitting: Family Medicine

## 2018-01-30 VITALS — BP 124/76 | HR 113 | Temp 98.1°F | Ht 68.5 in | Wt 200.4 lb

## 2018-01-30 DIAGNOSIS — L720 Epidermal cyst: Secondary | ICD-10-CM | POA: Diagnosis not present

## 2018-01-30 DIAGNOSIS — M5441 Lumbago with sciatica, right side: Secondary | ICD-10-CM

## 2018-01-30 DIAGNOSIS — R600 Localized edema: Secondary | ICD-10-CM | POA: Diagnosis not present

## 2018-01-30 DIAGNOSIS — Z803 Family history of malignant neoplasm of breast: Secondary | ICD-10-CM

## 2018-01-30 DIAGNOSIS — G8929 Other chronic pain: Secondary | ICD-10-CM

## 2018-01-30 DIAGNOSIS — M5442 Lumbago with sciatica, left side: Secondary | ICD-10-CM

## 2018-01-30 DIAGNOSIS — E538 Deficiency of other specified B group vitamins: Secondary | ICD-10-CM | POA: Diagnosis not present

## 2018-01-30 DIAGNOSIS — G894 Chronic pain syndrome: Secondary | ICD-10-CM

## 2018-01-30 DIAGNOSIS — E559 Vitamin D deficiency, unspecified: Secondary | ICD-10-CM

## 2018-01-30 DIAGNOSIS — E782 Mixed hyperlipidemia: Secondary | ICD-10-CM

## 2018-01-30 LAB — CBC WITH DIFFERENTIAL/PLATELET
Basophils Absolute: 0 10*3/uL (ref 0.0–0.1)
Basophils Relative: 0.4 % (ref 0.0–3.0)
Eosinophils Absolute: 0.1 10*3/uL (ref 0.0–0.7)
Eosinophils Relative: 1.8 % (ref 0.0–5.0)
HCT: 42.9 % (ref 36.0–46.0)
Hemoglobin: 14.3 g/dL (ref 12.0–15.0)
Lymphocytes Relative: 26.2 % (ref 12.0–46.0)
Lymphs Abs: 1.8 10*3/uL (ref 0.7–4.0)
MCHC: 33.3 g/dL (ref 30.0–36.0)
MCV: 90 fl (ref 78.0–100.0)
Monocytes Absolute: 0.4 10*3/uL (ref 0.1–1.0)
Monocytes Relative: 6.4 % (ref 3.0–12.0)
Neutro Abs: 4.5 10*3/uL (ref 1.4–7.7)
Neutrophils Relative %: 65.2 % (ref 43.0–77.0)
Platelets: 263 10*3/uL (ref 150.0–400.0)
RBC: 4.77 Mil/uL (ref 3.87–5.11)
RDW: 13.8 % (ref 11.5–15.5)
WBC: 6.9 10*3/uL (ref 4.0–10.5)

## 2018-01-30 LAB — COMPREHENSIVE METABOLIC PANEL
ALT: 15 U/L (ref 0–35)
AST: 23 U/L (ref 0–37)
Albumin: 4.6 g/dL (ref 3.5–5.2)
Alkaline Phosphatase: 72 U/L (ref 39–117)
BUN: 10 mg/dL (ref 6–23)
CO2: 29 mEq/L (ref 19–32)
Calcium: 9.5 mg/dL (ref 8.4–10.5)
Chloride: 104 mEq/L (ref 96–112)
Creatinine, Ser: 0.88 mg/dL (ref 0.40–1.20)
GFR: 68.71 mL/min (ref >60.00)
Glucose, Bld: 101 mg/dL — ABNORMAL HIGH (ref 70–99)
Potassium: 5.2 mEq/L — ABNORMAL HIGH (ref 3.5–5.1)
Sodium: 141 mEq/L (ref 135–145)
Total Bilirubin: 0.6 mg/dL (ref 0.2–1.2)
Total Protein: 7.1 g/dL (ref 6.0–8.3)

## 2018-01-30 LAB — VITAMIN D 25 HYDROXY (VIT D DEFICIENCY, FRACTURES): VITD: 53.85 ng/mL (ref 30.00–100.00)

## 2018-01-30 LAB — VITAMIN B12: Vitamin B-12: 857 pg/mL (ref 211–911)

## 2018-01-30 MED ORDER — AMOXICILLIN 875 MG PO TABS: 875.0000 mg | ORAL_TABLET | Freq: Two times a day (BID) | ORAL | 0 refills | Status: DC

## 2018-01-30 MED ORDER — MUPIROCIN 2 % EX OINT: 1.0000 "application " | TOPICAL_OINTMENT | Freq: Two times a day (BID) | CUTANEOUS | 0 refills | Status: DC

## 2018-01-30 MED ORDER — POTASSIUM CHLORIDE CRYS ER 20 MEQ PO TBCR
20.0000 meq | EXTENDED_RELEASE_TABLET | Freq: Every day | ORAL | 0 refills | Status: DC
Start: 2018-01-30 — End: 2018-11-28

## 2018-01-30 MED ORDER — OXYCODONE-ACETAMINOPHEN 10-325 MG PO TABS: 1.0000 | ORAL_TABLET | Freq: Three times a day (TID) | ORAL | 0 refills | Status: DC | PRN

## 2018-01-30 MED ORDER — FUROSEMIDE 20 MG PO TABS: 20.0000 mg | ORAL_TABLET | Freq: Every day | ORAL | 0 refills | Status: DC
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
## 2018-02-01 ENCOUNTER — Telehealth: Payer: Self-pay | Admitting: Licensed Clinical Social Worker

## 2018-02-01 ENCOUNTER — Encounter: Payer: Self-pay | Admitting: Licensed Clinical Social Worker

## 2018-02-01 NOTE — Telephone Encounter (Signed)
Received a genetic counseling referral from Dr. [name] for breast cancer. Pt has been cld and scheduled to see Faith Rogue on 12/16 at 1pm. Pt aware to arrive 15 minutes early. Letter mailed.

## 2018-02-02 ENCOUNTER — Encounter: Payer: Self-pay | Admitting: Family Medicine

## 2018-02-03 DIAGNOSIS — R600 Localized edema: Secondary | ICD-10-CM | POA: Insufficient documentation

## 2018-02-03 DIAGNOSIS — L720 Epidermal cyst: Secondary | ICD-10-CM | POA: Insufficient documentation

## 2018-02-03 DIAGNOSIS — E538 Deficiency of other specified B group vitamins: Secondary | ICD-10-CM | POA: Insufficient documentation

## 2018-02-03 DIAGNOSIS — Z803 Family history of malignant neoplasm of breast: Secondary | ICD-10-CM | POA: Insufficient documentation

## 2018-02-03 NOTE — Assessment & Plan Note (Signed)
Lab Results  Component Value Date   [id number] 320 01/30/2018   At goal.

## 2018-02-03 NOTE — Assessment & Plan Note (Signed)
Current vitamin D level is 6.41 on 09/27/17. Goal vitamin D level > 40. Plan: Vitamin D 5000 IU daily. Recheck level today.

## 2018-02-03 NOTE — Assessment & Plan Note (Signed)
Is the patient taking medications without problems? Yes Does the patient complain of muscle aches?  No Trying to exercise on a regular basis? No Compliant with diet? No  Lipid Panel  Lab Results  Component Value Date   CHOL 287 (H) 09/27/2017   HDL 70.90 09/27/2017   LDLCALC 200 (H) 09/27/2017   TRIG 79.0 09/27/2017   CHOLHDL 4 09/27/2017   Lab Results  Component Value Date   ALT 15 01/30/2018   AST 23 01/30/2018   ALKPHOS 72 01/30/2018   BILITOT 0.6 01/30/2018   Dyslipidemia under fair control.  Plan: 1. Continue dietary measures. 2. Continue regular exercise, an average 40 minutes of moderate to vigorous-intensity aerobic activity 3 or 4 times per week. 3. Lipid-lowering medications: Continue Zocor and Zetia. Will consider inreasing if patient agrees.

## 2018-02-03 NOTE — Assessment & Plan Note (Signed)
Strong family history. Will ask for genetic counseling.

## 2018-02-03 NOTE — Assessment & Plan Note (Signed)
Resolved. Discussed red flags. Provided mupirocin.

## 2018-02-03 NOTE — Assessment & Plan Note (Signed)
Chronic Pain, established problem, stable. 1. Current Medications: Tylenol, topical diclofenac, prn Oxycodone at 20 pills monthly. 2. Side Effects: None. 3. Medication Efficacy: Percocet 5/325 not helpful, but pain controlled when 2 tabs taken. 4. Interim History: No new symptoms. 5. Last UDS: unknown. 6. Controlled substance database reviewed and appropriate: Yes  ROS: No constipation, sedation, falls, or confusion.  Plan: 1. Pt warned that strong pain medication has side effects.  Watch for sedation and any alteration in mentation. Pt to stop medication if having inappropriate side effects. Also, all pain medications can have habit forming qualities: patient is aware of this information.

## 2018-02-03 NOTE — Assessment & Plan Note (Signed)
Resolved but intermittent and she will be on her feet more often soon. Recommendations: decrease sodium in the diet, elevate feet above the level of the heart whenever possible, increase physical activity, use of compression stockings and weight loss. PRN Lasix/K provided with strict instructions.The patient was also instructed to call IMMEDIATELY (i.e., day or night) if any cardiopulmonary symptoms occur, especially chest pain, shortness of breath, dyspnea on exertion, paroxysmal nocturnal dyspnea, or orthopnea, and these were explained.

## 2018-03-12 ENCOUNTER — Encounter: Payer: Self-pay | Admitting: Licensed Clinical Social Worker

## 2018-03-12 ENCOUNTER — Inpatient Hospital Stay: Payer: 59

## 2018-03-12 ENCOUNTER — Inpatient Hospital Stay: Payer: 59 | Attending: Genetic Counselor | Admitting: Licensed Clinical Social Worker

## 2018-03-12 DIAGNOSIS — Z8042 Family history of malignant neoplasm of prostate: Secondary | ICD-10-CM

## 2018-03-12 DIAGNOSIS — Z803 Family history of malignant neoplasm of breast: Secondary | ICD-10-CM

## 2018-03-12 DIAGNOSIS — Z8041 Family history of malignant neoplasm of ovary: Secondary | ICD-10-CM

## 2018-03-12 DIAGNOSIS — Z853 Personal history of malignant neoplasm of breast: Secondary | ICD-10-CM

## 2018-03-12 DIAGNOSIS — Z808 Family history of malignant neoplasm of other organs or systems: Secondary | ICD-10-CM

## 2018-03-12 DIAGNOSIS — Z800 Family history of malignant neoplasm of digestive organs: Secondary | ICD-10-CM | POA: Insufficient documentation

## 2018-03-12 NOTE — Progress Notes (Addendum)
REFERRING PROVIDER: Briscoe Deutscher, [address] [place], [place] [address]  PRIMARY PROVIDER:  Briscoe Deutscher, DO  PRIMARY REASON FOR VISIT:  1. Family history of breast cancer   2. Family history of ovarian cancer   3. Family history of prostate cancer   4. Family history of melanoma   5. Family history of throat cancer   6. Personal history of breast cancer      HISTORY OF PRESENT ILLNESS:   Ms. Holthaus, a 62 y.o. female, was seen for a [facility] cancer genetics consultation at the request of Dr. [name] due to a personal and family history of cancer.  Ms. Guilbault presents to clinic today to discuss the possibility of a hereditary predisposition to cancer, genetic testing, and to further clarify her future cancer risks, as well as potential cancer risks for family members.   At the age of 1, Ms. Maclaren was diagnosed with left breast cancer which was treated with lumpectomy and radiation.  At the age of 20, Ms. Perrault was diagnosed with left breast cancer again which was treated with mastectomy. Ms. Onken also has a history of skin cancer (SCC).  HORMONAL RISK FACTORS:  Menarche was at age 52.  First live birth at age 59.  OCP use for approximately 20+ years.  Ovaries intact: yes.  Hysterectomy: no.  Menopausal status: postmenopausal.  HRT use: 6 years. Colonoscopy: yes; normal. Mammogram within the last year: yes. Number of breast biopsies: 2.  Past Medical History:  Diagnosis Date  . Breast cancer ([place])   . Cataract, left 09/29/2017  . Chronic pain syndrome, on Neurontin 09/30/2017  . Dry eye syndrome of both eyes, on Restasis 09/30/2017  . Family history of breast cancer   . Family history of melanoma   . Family history of ovarian cancer   . Family history of prostate cancer   . Family history of throat cancer   . Frequent urinary tract infections   . GAD (generalized anxiety disorder) 03/11/2015  . Genital herpes   . History of chicken pox   . History  of degenerative disc disease 10/27/2002  . Knee osteonecrosis, left ([place]) 09/29/2017  . Major depression, recurrent, chronic ([place]), followed by Psych, on Wellbutrin, Cymbalta 09/30/2017  . Mixed hyperlipidemia, on Zetia and Zocor 09/30/2017  . 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 MVP (mitral valve prolapse) 03/13/2015  . 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 Nuclear sclerosis 06/10/2013  . Panic disorder, followed by Psych, on Xanax 09/30/2017  . Personal history of breast cancer   . Psoriasis   . PTSD (post-traumatic stress disorder) 03/11/2015  . Stroke ([place])   . 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 Vitamin D deficiency 09/30/2017    Past Surgical History:  Procedure Laterality Date  . AUGMENTATION MAMMAPLASTY Bilateral   . BREAST BIOPSY    . MASTECTOMY Left 1995   has a tram flap  . RECONSTRUCTION BREAST W/ TRAM FLAP Left 2003    Social History   Socioeconomic History  . Marital status: Divorced    Spouse name: Not on file  . Number of children: 2  . Years of education: Not on file  . Highest education level: Not on file  Occupational History  . Not on file  Social Needs  . Financial resource strain: Not on file  . Food insecurity:    Worry: Not on file    Inability: Not on file  . Transportation needs:    Medical:  Not on file    Non-medical: Not on file  Tobacco Use  . Smoking status: Current Every Day Smoker    Packs/day: 0.00    Types: Cigarettes  . Smokeless tobacco: Never Used  Substance and Sexual Activity  . Alcohol use: No    Alcohol/week: 0.0 standard drinks  . Drug use: Never  . Sexual activity: Not Currently  Lifestyle  . Physical activity:    Days per week: Not on file    Minutes per session: Not on file  . Stress: Not on file  Relationships  . Social connections:    Talks on phone: Not on file    Gets together: Not on file    Attends religious service: Not on file    Active member of club or organization: Not on file     Attends meetings of clubs or organizations: Not on file    Relationship status: Not on file  Other Topics Concern  . Not on file  Social History Narrative  . Not on file     FAMILY HISTORY:  We obtained a detailed, 4-generation family history.  Significant diagnoses are listed below: Family History  Problem Relation Age of Onset  . Cancer - Other Mother   . Miscarriages / [place] Mother   . Alzheimer's disease Mother   . Breast cancer Mother   . Skin cancer Mother   . Cancer - Prostate Father        metastatic  . Hyperlipidemia Father   . Throat cancer Father   . Cancer - Other Sister   . Depression Sister   . Hearing loss Sister   . Hyperlipidemia Sister   . Breast cancer Sister        both breasts  . Skin cancer Sister   . Cancer - Other Brother   . Throat cancer Brother   . Cancer - Other Sister   . Breast cancer Sister 12  . Breast cancer Maternal Grandmother   . Alzheimer's disease Maternal Grandfather   . Ovarian cancer Paternal Grandmother   . Heart attack Paternal Grandfather   . Breast cancer Paternal Aunt   . Prostate cancer Paternal Uncle   . Breast cancer Other        both breasts dx 4   Ms. Kemmerer has a daughter, age 53 and a son age 22. She has a brother who has had throat cancer diagnosed at 29. She also has a sister, Romie Minus, diagnosed with breast cancer of both breasts. She has another sister, Laddie Aquas, who was diagnosed with breast cancer at 7. She reports that both of her sisters have had negative genetic testing, however reports were not available for review at the time of the session.  Ms. [place] mother had breast cancer diagnosed at age 36, she died at 50. She had three brothers and a sister, no cancers. No cancers in the patient's maternal cousins. Her maternal grandmother had breast cancer and maternal grandfather had Alzheimer's.  Ms. Hayden father had throat cancer, metastatic prostate cancer, and melanoma. He died at 11. He had a  brother who also had prostate cancer. This brother had a daughter who was diagnosed with cancer in both breasts at 42. Ms. Guttierrez father had two sisters, one of them had breast cancer, unk age of diagnosis. The patient's maternal grandmother had ovarian cancer diagnosed in her 38s and her grandfather had several heart attacks.  Ms. Brossard is aware of previous family history of genetic testing for hereditary cancer  risks. Patient's maternal ancestors are of [place] descent, and paternal ancestors are of Irish/[place] descent. There is no reported Ashkenazi Jewish ancestry. There is no known consanguinity.  GENETIC COUNSELING ASSESSMENT: ZYONNA VARDAMAN is a 62 y.o. female with a personal and family history which is somewhat suggestive of a Hereditary Cancer Predisposition Syndrome. We, therefore, discussed and recommended the following at today's visit.   DISCUSSION: We discussed that about 5-10% of breast cancer cases are hereditary with most cases due to BRCA mutations.  We reviewed the characteristics, features and inheritance patterns of hereditary cancer syndromes. We also discussed genetic testing, including the appropriate family members to test, the process of testing, insurance coverage and turn-around-time for results. We discussed the implications of a negative, positive and/or variant of uncertain significant result. We recommended Ms. Wynetta Emery pursue genetic testing for the [employer].   The Multi-Cancer Panel offered by [facility] includes sequencing and/or deletion duplication testing of the following 84 genes: AIP, ALK, APC, ATM, AXIN2,BAP1,  BARD1, BLM, BMPR1A, BRCA1, BRCA2, BRIP1, CASR, CDC73, CDH1, CDK4, CDKN1B, CDKN1C, CDKN2A (p14ARF), CDKN2A (p16INK4a), CEBPA, CHEK2, CTNNA1, DICER1, DIS3L2, EGFR (c.2369C>T, p.Thr790Met variant only), EPCAM (Deletion/duplication testing only), FH, FLCN, GATA2, GPC3, GREM1 (Promoter region deletion/duplication testing only),  HOXB13 (c.251G>A, p.Gly84Glu), HRAS, KIT, MAX, MEN1, MET, MITF (c.952G>A, p.Glu318Lys variant only), MLH1, MSH2, MSH3, MSH6, MUTYH, NBN, NF1, NF2, NTHL1, PALB2, PDGFRA, PHOX2B, PMS2, POLD1, POLE, POT1, PRKAR1A, PTCH1, PTEN, RAD50, RAD51C, RAD51D, RB1, RECQL4, RET, RUNX1, SDHAF2, SDHA (sequence changes only), SDHB, SDHC, SDHD, SMAD4, SMARCA4, SMARCB1, SMARCE1, STK11, SUFU, TERC, TERT, TMEM127, TP53, TSC1, TSC2, VHL, WRN and WT1.   We discussed that if she is found to have a mutation in one of these genes, it may impact future medical management recommendations such as increased cancer screenings and consideration of risk reducing surgeries.  A positive result could also have implications for the patient's family members.  A Negative result would mean we were unable to identify a hereditary component to her personal and family history of cancer but does not rule out the possibility of a hereditary basis for her personal and family history of cancer.  There could be mutations that are undetectable by current technology, or in genes not yet tested or identified to increase cancer risk.    We discussed the potential to find a Variant of Uncertain Significance or VUS.  These are variants that have not yet been identified as pathogenic or benign, and it is unknown if this variant is associated with increased cancer risk or if this is a normal finding.  Most VUS's are reclassified to benign or likely benign.   It should not be used to make medical management decisions. With time, we suspect the lab will determine the significance of any VUS's identified if any.   We also briefly discussed Alzheimer's disease as she brought up concerns about this. We discussed that we only do testing for cancer genes at our clinic, and that clinical genetic testing for Alzheimer's is usually only indicated for families who have history of early onset Alzheimer's/dementia.   Based on Ms. Viele's personal and family history of  cancer, she meets NCCN medical criteria for genetic testing. Despite that she meets criteria, she may still have an out of pocket cost.   PLAN: After considering the risks, benefits, and limitations, Ms. Velador  provided informed consent to pursue genetic testing and the blood sample was sent to [facility] for analysis of the Multi-Cancer Panel. Results should be available  within approximately 2-3 weeks' time, at which point they will be disclosed by telephone to Ms. Raether, as will any additional recommendations warranted by these results. Ms. Edelman will receive a summary of her genetic counseling visit and a copy of her results once available. This information will also be available in Epic.  Lastly, we encouraged Ms. Catalano to remain in contact with cancer genetics annually so that we can continuously update the family history and inform her of any changes in cancer genetics and testing that may be of benefit for this family.   Ms.  Secord questions were answered to her satisfaction today. Our contact information was provided should additional questions or concerns arise. Thank you for the referral and allowing [place] to share in the care of your patient.   Faith Rogue, MS Genetic Counselor [email or web address] Phone: [phone]  The patient was seen for a total of 45 minutes in face-to-face genetic counseling.

## 2018-03-16 ENCOUNTER — Telehealth: Payer: Self-pay | Admitting: Licensed Clinical Social Worker

## 2018-03-16 NOTE — Telephone Encounter (Signed)
[facility] reached out to me and let me know that this patient's primary coverage is through Medicare and needs a copy of the card, called the patient to ask for a copy of her medicare card and she said that [facility] is her primary and to let [facility] know.

## 2018-03-25 ENCOUNTER — Other Ambulatory Visit: Payer: Self-pay | Admitting: Family Medicine

## 2018-03-25 DIAGNOSIS — R600 Localized edema: Secondary | ICD-10-CM

## 2018-03-26 ENCOUNTER — Telehealth: Payer: Self-pay | Admitting: Licensed Clinical Social Worker

## 2018-03-26 NOTE — Telephone Encounter (Signed)
Revealed negative genetic testing.  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  We discussed that we do not know why she has had breast cancer or why there is cancer in the family. It could be due to a different gene that we are not testing, or something our current technology cannot pick up.  It will be important for her to keep in contact with genetics to learn if additional testing may be needed in the future. Her paternal relatives could benefit from genetic counseling and testing.

## 2018-03-27 ENCOUNTER — Ambulatory Visit: Payer: Self-pay | Admitting: Licensed Clinical Social Worker

## 2018-03-27 ENCOUNTER — Encounter: Payer: Self-pay | Admitting: Licensed Clinical Social Worker

## 2018-03-27 DIAGNOSIS — Z1379 Encounter for other screening for genetic and chromosomal anomalies: Secondary | ICD-10-CM

## 2018-03-27 NOTE — Progress Notes (Signed)
HPI:  Alyssa Deleon was previously seen in the [place] clinic on 03/12/2018 due to a personal and family history of cancer and concerns regarding a hereditary predisposition to cancer. Please refer to our prior cancer genetics clinic note for more information regarding Alyssa Deleon's medical, social and family histories, and our assessment and recommendations, at the time. Alyssa Deleon recent genetic test results were disclosed to her, as well as recommendations warranted by these results. These results and recommendations are discussed in more detail below.  CANCER HISTORY:   No history exists.     FAMILY HISTORY:  We obtained a detailed, 4-generation family history.  Significant diagnoses are listed below: Family History  Problem Relation Age of Onset  . Cancer - Other Mother   . Miscarriages / [place] Mother   . Alzheimer's disease Mother   . Breast cancer Mother   . Skin cancer Mother   . Cancer - Prostate Father        metastatic  . Hyperlipidemia Father   . Throat cancer Father   . Cancer - Other Sister   . Depression Sister   . Hearing loss Sister   . Hyperlipidemia Sister   . Breast cancer Sister        both breasts  . Skin cancer Sister   . Cancer - Other Brother   . Throat cancer Brother   . Cancer - Other Sister   . Breast cancer Sister 66  . Breast cancer Maternal Grandmother   . Alzheimer's disease Maternal Grandfather   . Ovarian cancer Paternal Grandmother   . Heart attack Paternal Grandfather   . Breast cancer Paternal Aunt   . Prostate cancer Paternal Uncle   . Breast cancer Other        both breasts dx 63   Alyssa Deleon has a daughter, age 54 and a son age 24. She has a brother who has had throat cancer diagnosed at 55. She also has a sister, Romie Minus, diagnosed with breast cancer of both breasts. She has another sister, Laddie Aquas, who was diagnosed with breast cancer at 59. She reports that both of her sisters have had negative genetic testing,  however reports were not available for review at the time of the session.  Alyssa Deleon mother had breast cancer diagnosed at age 5, she died at 36. She had three brothers and a sister, no cancers. No cancers in the patient's maternal cousins. Her maternal grandmother had breast cancer and maternal grandfather had Alzheimer's.  Alyssa Deleon father had throat cancer, metastatic prostate cancer, and melanoma. He died at 6. He had a brother who also had prostate cancer. This brother had a daughter who was diagnosed with cancer in both breasts at 18. Alyssa Deleon father had two sisters, one of them had breast cancer, unk age of diagnosis. The patient's maternal grandmother had ovarian cancer diagnosed in her 85s and her grandfather had several heart attacks.  Alyssa Deleon is aware of previous family history of genetic testing for hereditary cancer risks. Patient's maternal ancestors are of [place] descent, and paternal ancestors are of Irish/[place] descent. There is no reported Ashkenazi Jewish ancestry. There is no known consanguinity.  GENETIC TEST RESULTS: Genetic testing performed through [facility]'s Multi-Cancer Panel reported out on 03/22/2018 showed no pathogenic mutations. The Multi-Cancer Panel offered by [facility] includes sequencing and/or deletion duplication testing of the following 84 genes: AIP, ALK, APC, ATM, AXIN2,BAP1,  BARD1, BLM, BMPR1A, BRCA1, BRCA2, BRIP1, CASR, CDC73, CDH1, CDK4, CDKN1B,  CDKN1C, CDKN2A (p14ARF), CDKN2A (p16INK4a), CEBPA, CHEK2, CTNNA1, DICER1, DIS3L2, EGFR (c.2369C>T, p.Thr790Met variant only), EPCAM (Deletion/duplication testing only), FH, FLCN, GATA2, GPC3, GREM1 (Promoter region deletion/duplication testing only), HOXB13 (c.251G>A, p.Gly84Glu), HRAS, KIT, MAX, MEN1, MET, MITF (c.952G>A, p.Glu318Lys variant only), MLH1, MSH2, MSH3, MSH6, MUTYH, NBN, NF1, NF2, NTHL1, PALB2, PDGFRA, PHOX2B, PMS2, POLD1, POLE, POT1, PRKAR1A, PTCH1, PTEN, RAD50, RAD51C,  RAD51D, RB1, RECQL4, RET, RUNX1, SDHAF2, SDHA (sequence changes only), SDHB, SDHC, SDHD, SMAD4, SMARCA4, SMARCB1, SMARCE1, STK11, SUFU, TERC, TERT, TMEM127, TP53, TSC1, TSC2, VHL, WRN and WT1.  A variant of uncertain significance (VUS) in a gene called KIT was also noted.   The test report will be scanned into EPIC and will be located under the Molecular Pathology section of the Results Review tab. A portion of the result report is included below for reference.     We discussed with Alyssa Deleon that because current genetic testing is not perfect, it is possible there may be a gene mutation in one of these genes that current testing cannot detect, but that chance is small.  We also discussed, that there could be another gene that has not yet been discovered, or that we have not yet tested, that is responsible for the cancer diagnoses in the family. It is also possible there is a hereditary cause for the cancer in the family that Alyssa Deleon did not inherit and therefore was not identified in her testing.  Therefore, it is important to remain in touch with cancer genetics in the future so that we can continue to offer Alyssa Deleon the most up to date genetic testing.   Regarding the VUS in KIT: At this time, it is unknown if this variant is associated with increased cancer risk or if this is a normal finding, but most variants such as this get reclassified to being inconsequential. It should not be used to make medical management decisions. With time, we suspect the lab will determine the significance of this variant, if any. If we do learn more about it, we will try to contact Ms. Strawder to discuss it further. However, it is important to stay in touch with [place] periodically and keep the address and phone number up to date.  ADDITIONAL GENETIC TESTING: We discussed with Alyssa Deleon that her genetic testing was fairly extensive.  If there are are genes identified to increase cancer risk that can be analyzed  in the future, we would be happy to discuss and coordinate this testing at that time.    CANCER SCREENING RECOMMENDATIONS: Alyssa Deleon test result is considered negative (normal).  This means that we have not identified a hereditary cause for her personal and family history of cancer at this time.   While reassuring, this does not definitively rule out a hereditary predisposition to cancer. It is still possible that there could be genetic mutations that are undetectable by current technology, or genetic mutations in genes that have not been tested or identified to increase cancer risk.  Therefore, it is recommended she continue to follow the cancer management and screening guidelines provided by her oncology and primary healthcare provider. An individual's cancer risk is not determined by genetic test results alone.  Overall cancer risk assessment includes additional factors such as personal medical history, family history, etc.  These should be used to make a personalized plan for cancer prevention and surveillance.    Given Alyssa Deleon personal and family histories, we must consider these negative results carefully.  Families with features suggestive  of hereditary risk for cancer tend to have multiple family members with cancer, diagnoses in multiple generations, and diagnoses before the age of 68. Alyssa Deleon family exhibits some of these features. Therefore this negative result may actually be due to the limitations of current technology to detect all mutations within these genes, or there may be a different gene that has not yet been discovered or tested.   RECOMMENDATIONS FOR FAMILY MEMBERS:  Relatives in this family might be at some increased risk of developing cancer, over the general population risk, simply due to the family history of cancer.  We recommended women in this family have a yearly mammogram beginning at age 60, or 65 years younger than the earliest onset of cancer, an annual  clinical breast exam, and perform monthly breast self-exams. Women in this family should also have a gynecological exam as recommended by their primary provider. All family members should have a colonoscopy as directed by their doctors.  All family members should inform their physicians about the family history of cancer so their doctors can make the most appropriate screening recommendations for them.   It is also possible there is a hereditary cause for the cancer in Alyssa Deleon's family that she did not inherit and therefore was not identified in her.  We recommended her maternal and paternal relatives have genetic counseling and testing. Alyssa Deleon will let us know if we can be of any assistance in coordinating genetic counseling and/or testing for these family members.   FOLLOW-UP: Lastly, we discussed with Ms. Osterman that cancer genetics is a rapidly advancing field and it is possible that new genetic tests will be appropriate for her and/or her family members in the future. We encouraged her to remain in contact with cancer genetics on an annual basis so we can update her personal and family histories and let her know of advances in cancer genetics that may benefit this family.   Our contact number was provided. Ms. Dickard questions were answered to her satisfaction, and she knows she is welcome to call us at anytime with additional questions or concerns.  Faith Rogue, MS Genetic Counselor [email or web address] Phone: [phone]

## 2018-05-02 ENCOUNTER — Ambulatory Visit: Payer: 59 | Admitting: Family Medicine

## 2018-06-13 ENCOUNTER — Telehealth: Payer: Self-pay | Admitting: Family Medicine

## 2018-06-13 NOTE — Telephone Encounter (Signed)
Copied from [place] [id number]. Topic: Quick Communication - Rx Refill/Question >> Jun 13, 2018  4:23 PM Wynetta Emery, [place] C wrote: Medication: oxyCODONE-acetaminophen (PERCOCET) 10-325 MG tablet   Has the patient contacted their pharmacy? Yes  (Agent: If no, request that the patient contact the pharmacy for the refill.) (Agent: If yes, when and what did the pharmacy advise?)  Preferred Pharmacy (with phone number or street name): [id number] - [place], [place]. AT [address] [phone] (Phone) [phone] (Fax)    Agent: Please be advised that RX refills may take up to 3 business days. We ask that you follow-up with your pharmacy.

## 2018-06-13 NOTE — Telephone Encounter (Signed)
See note

## 2018-06-13 NOTE — Telephone Encounter (Signed)
Please advise 

## 2018-06-14 ENCOUNTER — Encounter (INDEPENDENT_AMBULATORY_CARE_PROVIDER_SITE_OTHER): Payer: 59

## 2018-06-14 DIAGNOSIS — G894 Chronic pain syndrome: Secondary | ICD-10-CM

## 2018-06-14 MED ORDER — OXYCODONE-ACETAMINOPHEN 10-325 MG PO TABS: 1.0000 | ORAL_TABLET | Freq: Three times a day (TID) | ORAL | 0 refills | Status: DC | PRN

## 2018-06-14 NOTE — Telephone Encounter (Signed)
She canceled in February. For documentation, needs a mychart or phone visit. Need consent.

## 2018-06-14 NOTE — Telephone Encounter (Signed)
Mychart sent.

## 2018-06-14 NOTE — Telephone Encounter (Signed)
[facility] VISIT   Patient agreed to [facility] visit and is aware that copayment and coinsurance may apply. Patient was treated using telemedicine according to accepted telemedicine protocols.  Subjective:   Patient complains of chronic pain with need for refill of Percocet. See myChart communication for more details.   Chronic pain syndrome 1.Current Medications: Tylenol, topical diclofenac, Gabapentin, and as needed Oxycodone at 20 pills monthly. 2.Side Effects: None. 3.Medication Efficacy: Percocet 10/325 controlling pain 4.Controlled substance database reviewed and appropriate: Yes  Patient denies side effects of medication, including sedation, falls, or constipation.   Patient Active Problem List   Diagnosis Date Noted  . Genetic testing 03/27/2018  . Family history of breast cancer   . Family history of ovarian cancer   . Family history of prostate cancer   . Family history of melanoma   . Family history of throat cancer   . Personal history of breast cancer   . Bilateral lower extremity edema 02/03/2018  . Epidermoid cyst of skin 02/03/2018  . Family history of malignant neoplasm of female breast 02/03/2018  . B12 deficiency 02/03/2018  . Mixed hyperlipidemia, on Zetia and Zocor 09/30/2017  . Chronic pain syndrome, on Neurontin 09/30/2017  . Vitamin D deficiency 09/30/2017  . Major depression, recurrent, chronic ([place]), followed by Psych, on Wellbutrin, Cymbalta 09/30/2017  . Panic disorder, followed by Psych, on Xanax 09/30/2017  . Dry eye syndrome of both eyes, on Restasis 09/30/2017  . History of stroke, on ASA 09/30/2017  . Cataract, left 09/29/2017  . 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 Pain in right knee 07/09/2017  . Chronic low back pain 05/24/2017  . Sciatica 05/22/2017  . MVP (mitral valve prolapse) 03/13/2015  . GAD (generalized anxiety disorder) 03/11/2015  . PTSD (post-traumatic stress disorder) 03/11/2015  . 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 Nuclear sclerosis 06/10/2013  . 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 History of degenerative disc disease 10/27/2002  . History of brain tumor 09/01/1997  . History of breast cancer 03/28/1993   Social History   Tobacco Use  . Smoking status: Current Every Day Smoker    Packs/day: 0.00    Types: Cigarettes  . Smokeless tobacco: Never Used  Substance Use Topics  . Alcohol use: No    Alcohol/week: 0.0 standard drinks    Current Outpatient Medications:  .  ALPRAZolam (XANAX) 1 MG tablet, Take 3 mg by mouth at bedtime as needed for anxiety., Disp: , Rfl:  .  amoxicillin (AMOXIL) 875 MG tablet, Take 1 tablet (875 mg total) by mouth 2 (two) times daily., Disp: 20 tablet, Rfl: 0 .  aspirin 500 MG tablet, Take by mouth., Disp: , Rfl:  .  buPROPion (WELLBUTRIN XL) 300 MG 24 hr tablet, Take 300 mg by mouth daily., Disp: , Rfl:  .  Cholecalciferol 50000 units TABS, 50,000 units PO qwk for 12 weeks., Disp: 12 tablet, Rfl: 0 .  cycloSPORINE (RESTASIS) 0.05 % ophthalmic emulsion, 1 drop 2 (two) times daily., Disp: , Rfl:  .  diclofenac sodium (VOLTAREN) 1 % GEL, Voltaren 1 % topical gel  APPLY 2 GRAM TO THE AFFECTED AREA(S) BY TOPICAL ROUTE 4 TIMES PER DAY, Disp: , Rfl:  .  DULoxetine (CYMBALTA) 60 MG capsule, Take 60 mg by mouth daily., Disp: , Rfl:  .  ezetimibe (ZETIA) 10 MG tablet, Take 10 mg by mouth daily., Disp: , Rfl:  .  furosemide (LASIX) 20 MG tablet,  TAKE 1 TABLET BY MOUTH EVERY DAY, Disp: 30 tablet, Rfl: 0 .  gabapentin (NEURONTIN) 300 MG capsule, Take 1 capsule (300 mg total) by mouth 4 (four) times daily. (Patient taking differently: Take 300 mg by mouth every morning. ), Disp: 460 capsule, Rfl: 0 .  mupirocin ointment (BACTROBAN) 2 %, Apply 1 application topically 2 (two) times daily., Disp: 22 g, Rfl: 0 .  [START ON 08/09/2018] oxyCODONE-acetaminophen (PERCOCET) 10-325 MG tablet, Take 1  tablet by mouth every 8 (eight) hours as needed for pain., Disp: 20 tablet, Rfl: 0 .  [START ON 07/12/2018] oxyCODONE-acetaminophen (PERCOCET) 10-325 MG tablet, Take 1 tablet by mouth every 8 (eight) hours as needed for pain., Disp: 20 tablet, Rfl: 0 .  oxyCODONE-acetaminophen (PERCOCET) 10-325 MG tablet, Take 1 tablet by mouth every 8 (eight) hours as needed for pain., Disp: 20 tablet, Rfl: 0 .  potassium chloride SA (K-DUR,KLOR-CON) 20 MEQ tablet, Take 1 tablet (20 mEq total) by mouth daily., Disp: 30 tablet, Rfl: 0 .  simvastatin (ZOCOR) 40 MG tablet, Take 1 tablet (40 mg total) by mouth daily., Disp: 90 tablet, Rfl: 3   Allergies  Allergen Reactions  . Hydrocodone-Acetaminophen Shortness Of Breath  . Klonopin [Clonazepam] Swelling  . Oxycodone Hcl   . Crestor [Rosuvastatin] Other (See Comments)    Myalgias  . Morphine Sulfate Itching  . Effexor [Venlafaxine] Other (See Comments)    Weight gain  . Sulfa Antibiotics Diarrhea  . Vytorin [Ezetimibe-Simvastatin] Other (See Comments)    Myalgias    Assessment and Plan:   Diagnosis: Chronic pain syndrome. Please see myChart communication and orders below.   Meds ordered this encounter  Medications  . oxyCODONE-acetaminophen (PERCOCET) 10-325 MG tablet    Sig: Take 1 tablet by mouth every 8 (eight) hours as needed for pain.    Dispense:  20 tablet    Refill:  0  . oxyCODONE-acetaminophen (PERCOCET) 10-325 MG tablet    Sig: Take 1 tablet by mouth every 8 (eight) hours as needed for pain.    Dispense:  20 tablet    Refill:  0  . oxyCODONE-acetaminophen (PERCOCET) 10-325 MG tablet    Sig: Take 1 tablet by mouth every 8 (eight) hours as needed for pain.    Dispense:  20 tablet    Refill:  0    Briscoe Deutscher, DO 06/14/2018  A total of 10 minutes were spent by me to personally review the patient-generated inquiry, review patient records and data pertinent to assessment of the patient's problem, develop a management plan including  generation of prescriptions and/or orders, and on subsequent communication with the patient through secure the MyChart portal service.   There is no separately reported E/M service related to this service in the past 7 days nor does the patient have an upcoming soonest available appointment for this issue. This work was completed in less than 7 days.   The patient consented to this service today (see patient agreement prior to ongoing communication). Patient counseled regarding the need for in-person exam for certain conditions and was advised to call the office if any changing or worsening symptoms occur.   The codes to be used for the E/M service are: [x]   99421 for 5-10 minutes of time spent on the inquiry. []   [id number] for 11-20 minutes. []   99423 for 21+ minutes.

## 2018-06-17 ENCOUNTER — Encounter: Payer: Self-pay | Admitting: Family Medicine

## 2018-06-18 NOTE — Telephone Encounter (Signed)
Did you mean to route this to [facility] team care?

## 2018-08-02 ENCOUNTER — Other Ambulatory Visit: Payer: Self-pay | Admitting: Family Medicine

## 2018-08-02 DIAGNOSIS — E782 Mixed hyperlipidemia: Secondary | ICD-10-CM

## 2018-08-02 NOTE — Telephone Encounter (Signed)
Okay refill but patient needs virtual appointment (as should be seen q 3 months with her medical issues).

## 2018-08-02 NOTE — Telephone Encounter (Signed)
Pt informed that her Zocor has been refilled and pt scheduled for MyChart Video Visit for 08/08/18.

## 2018-08-08 ENCOUNTER — Encounter: Payer: Self-pay | Admitting: Family Medicine

## 2018-08-08 ENCOUNTER — Telehealth: Payer: 59 | Admitting: Family Medicine
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
## 2018-11-22 ENCOUNTER — Telehealth: Payer: Self-pay | Admitting: Family Medicine

## 2018-11-22 NOTE — Telephone Encounter (Signed)
Pt says that she just started on Medicare Plan. Pt says that they told her that they will cover the medication ezetimibe (ZETIA) 10 MG tablet. Pt says her previous insurance wouldn't.   Pt is requesting a Rx for 30 day supply for 3 months sent to pharmacy below.      Pharmacy:  [place], [address] [employer] [phone] (Phone) [phone] (Fax)

## 2018-11-22 NOTE — Telephone Encounter (Signed)
I called patient and scheduled her a visit with Dr. [name].  Pt has not been seen since Nov 2019.

## 2018-11-28 ENCOUNTER — Encounter: Payer: Self-pay | Admitting: Family Medicine

## 2018-11-28 ENCOUNTER — Ambulatory Visit (INDEPENDENT_AMBULATORY_CARE_PROVIDER_SITE_OTHER): Payer: Medicare Other | Admitting: Family Medicine

## 2018-11-28 VITALS — Ht 68.5 in | Wt 198.0 lb

## 2018-11-28 DIAGNOSIS — E782 Mixed hyperlipidemia: Secondary | ICD-10-CM

## 2018-11-28 DIAGNOSIS — E538 Deficiency of other specified B group vitamins: Secondary | ICD-10-CM | POA: Diagnosis not present

## 2018-11-28 DIAGNOSIS — F063 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, unspecified: Secondary | ICD-10-CM | POA: Diagnosis not present

## 2018-11-28 DIAGNOSIS — G8929 Other chronic pain: Secondary | ICD-10-CM

## 2018-11-28 DIAGNOSIS — M5442 Lumbago with sciatica, left side: Secondary | ICD-10-CM | POA: Diagnosis not present

## 2018-11-28 DIAGNOSIS — E559 Vitamin D deficiency, unspecified: Secondary | ICD-10-CM

## 2018-11-28 DIAGNOSIS — M5441 Lumbago with sciatica, right side: Secondary | ICD-10-CM

## 2018-11-28 MED ORDER — GABAPENTIN 300 MG PO CAPS: 300.0000 mg | ORAL_CAPSULE | Freq: Two times a day (BID) | ORAL | 3 refills | Status: DC

## 2018-11-28 MED ORDER — EZETIMIBE 10 MG PO TABS: 10.0000 mg | ORAL_TABLET | Freq: Every day | ORAL | 1 refills | Status: DC

## 2018-11-28 MED ORDER — DULOXETINE HCL 60 MG PO CPEP: 60.0000 mg | ORAL_CAPSULE | Freq: Every day | ORAL | 1 refills | Status: DC

## 2018-11-28 MED ORDER — SIMVASTATIN 40 MG PO TABS: 40.0000 mg | ORAL_TABLET | Freq: Every day | ORAL | 3 refills | Status: DC

## 2018-11-28 MED ORDER — ALPRAZOLAM 1 MG PO TABS: 3.0000 mg | ORAL_TABLET | Freq: Every evening | ORAL | 1 refills | Status: DC | PRN

## 2018-11-28 MED ORDER — BUPROPION HCL ER (XL) 300 MG PO TB24: 300.0000 mg | ORAL_TABLET | Freq: Every day | ORAL | 1 refills | Status: DC

## 2018-11-28 MED ORDER — OXYCODONE-ACETAMINOPHEN 10-325 MG PO TABS: 1.0000 | ORAL_TABLET | Freq: Three times a day (TID) | ORAL | 0 refills | Status: DC | PRN

## 2018-11-28 NOTE — Progress Notes (Signed)
Virtual Visit via Video   Due to the COVID-19 pandemic, this visit was completed with telemedicine (audio/video) technology to reduce patient and provider exposure as well as to preserve personal protective equipment.   I connected with Alyssa Deleon by a video enabled telemedicine application and verified that I am speaking with the correct person using two identifiers. Location patient: Home Location provider: [facility], Office Persons participating in the virtual visit: Alyssa Deleon, Briscoe Deutscher, DO   I discussed the limitations of evaluation and management by telemedicine and the availability of in person appointments. The patient expressed understanding and agreed to proceed.  Care Team   Patient Care Team: Briscoe Deutscher, DO as PCP - General (Family Medicine) Ambrose Finland, MD as Consulting Physician (Psychiatry) Teodoro Spray, [place] (Optometry) Lavonna Monarch, MD as Consulting Physician (Dermatology)  Subjective:   HPI: See Assessment and Plan section for Problem Based Charting of issues discussed today.   Patient Active Problem List   Diagnosis Date Noted  . Genetic testing 03/27/2018  . Family history of breast cancer   . Family history of ovarian cancer   . Family history of prostate cancer   . Family history of melanoma   . Family history of throat cancer   . Personal history of breast cancer   . Bilateral lower extremity edema 02/03/2018  . Epidermoid cyst of skin 02/03/2018  . B12 deficiency 02/03/2018  . Mixed hyperlipidemia, on Zetia and Zocor 09/30/2017  . Chronic pain syndrome, on Neurontin 09/30/2017  . Vitamin D deficiency 09/30/2017  . Major depression, recurrent, chronic ([place]), followed by Psych, on Wellbutrin, Cymbalta 09/30/2017  . Panic disorder, followed by Psych, on Xanax 09/30/2017  . Dry eye syndrome of both eyes, on Restasis 09/30/2017  . History of stroke, on ASA 09/30/2017  . Cataract, left 09/29/2017  . Knee  osteonecrosis, left ([place]) 09/29/2017  . Pain in right knee 07/09/2017  . Chronic low back pain 05/24/2017  . MVP (mitral valve prolapse) 03/13/2015  . GAD (generalized anxiety disorder) 03/11/2015  . PTSD (post-traumatic stress disorder) 03/11/2015  . 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 Nuclear sclerosis 06/10/2013  . 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 History of degenerative disc disease 10/27/2002  . History of brain tumor 09/01/1997  . History of breast cancer 03/28/1993    Social History   Tobacco Use  . Smoking status: Current Every Day Smoker    Packs/day: 0.00    Types: Cigarettes  . Smokeless tobacco: Never Used  Substance Use Topics  . Alcohol use: No    Alcohol/week: 0.0 standard drinks   Current Outpatient Medications:  .  ALPRAZolam (XANAX) 1 MG tablet, Take 3 mg by mouth at bedtime as needed for anxiety., Disp: , Rfl:  .  aspirin 500 MG tablet, Take by mouth., Disp: , Rfl:  .  buPROPion (WELLBUTRIN XL) 300 MG 24 hr tablet, Take 300 mg by mouth daily., Disp: , Rfl:  .  Cholecalciferol 50000 units TABS, 50,000 units PO qwk for 12 weeks., Disp: 12 tablet, Rfl: 0 .  cycloSPORINE (RESTASIS) 0.05 % ophthalmic emulsion, 1 drop 2 (two) times daily., Disp: , Rfl:  .  diclofenac sodium (VOLTAREN) 1 % GEL, Voltaren 1 % topical gel  APPLY 2 GRAM TO THE AFFECTED AREA(S) BY TOPICAL ROUTE 4 TIMES PER DAY, Disp: , Rfl:  .  DULoxetine (CYMBALTA) 60 MG capsule, Take 60 mg by mouth daily., Disp: ,  Rfl:  .  ezetimibe (ZETIA) 10 MG tablet, Take 10 mg by mouth daily., Disp: , Rfl:  .  furosemide (LASIX) 20 MG tablet, TAKE 1 TABLET BY MOUTH EVERY DAY, Disp: 30 tablet, Rfl: 0 .  gabapentin (NEURONTIN) 300 MG capsule, Take 1 capsule (300 mg total) by mouth 2 (two) times daily., Disp: 180 capsule, Rfl: 3 .  oxyCODONE-acetaminophen (PERCOCET) 10-325 MG tablet, Take 1 tablet by mouth every 8 (eight) hours as needed for  pain., Disp: 20 tablet, Rfl: 0 .  potassium chloride SA (K-DUR,KLOR-CON) 20 MEQ tablet, Take 1 tablet (20 mEq total) by mouth daily., Disp: 30 tablet, Rfl: 0 .  simvastatin (ZOCOR) 40 MG tablet, TAKE 1 TABLET BY MOUTH  DAILY, Disp: 90 tablet, Rfl: 3  Allergies  Allergen Reactions  . Hydrocodone-Acetaminophen Shortness Of Breath  . Klonopin [Clonazepam] Swelling  . Oxycodone Hcl   . Crestor [Rosuvastatin] Other (See Comments)    Myalgias  . Morphine Sulfate Itching  . Effexor [Venlafaxine] Other (See Comments)    Weight gain  . Sulfa Antibiotics Diarrhea  . Vytorin [Ezetimibe-Simvastatin] Other (See Comments)    Myalgias    Objective:   VITALS: Per patient if applicable, see vitals. GENERAL: Alert, appears well and in no acute distress. HEENT: Atraumatic, conjunctiva clear, no obvious abnormalities on inspection of external nose and ears. NECK: Normal movements of the head and neck. CARDIOPULMONARY: No increased WOB. Speaking in clear sentences. I:E ratio WNL.  MS: Moves all visible extremities without noticeable abnormality. PSYCH: Pleasant and cooperative, well-groomed. Speech normal rate and rhythm. Affect is appropriate. Insight and judgement are appropriate. Attention is focused, linear, and appropriate.  NEURO: CN grossly intact. Oriented as arrived to appointment on time with no prompting. Moves both UE equally.  SKIN: No obvious lesions, wounds, erythema, or cyanosis noted on face or hands.  Depression screen [facility] 2/9 11/28/2018  Decreased Interest 0  Down, Depressed, Hopeless 0  PHQ - 2 Score 0  Altered sleeping 0  Tired, decreased energy 0  Change in appetite 0  Feeling bad or failure about yourself  0  Trouble concentrating 0  Moving slowly or fidgety/restless 0  Suicidal thoughts 0  PHQ-9 Score 0  Difficult doing work/chores Not difficult at all    Assessment and Plan:   Alyssa Deleon was seen today for medication refill.  Diagnoses and all orders for this  visit:  Chronic bilateral low back pain with bilateral sciatica Comments: Worsening. Pain radiates to both legs. Feels like they may give out. Due for oxycodone refill. Gets 20/month to use sparingly.  Orders: -     gabapentin (NEURONTIN) 300 MG capsule; Take 1 capsule (300 mg total) by mouth 2 (two) times daily. -     DG Lumbar Spine Complete; Future -     oxyCODONE-acetaminophen (PERCOCET) 10-325 MG tablet; Take 1 tablet by mouth every 8 (eight) hours as needed for pain.  Mixed hyperlipidemia, on Zetia and Zocor -     simvastatin (ZOCOR) 40 MG tablet; Take 1 tablet (40 mg total) by mouth daily. -     ezetimibe (ZETIA) 10 MG tablet; Take 1 tablet (10 mg total) by mouth daily. 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 -     buPROPion (WELLBUTRIN XL) 300 MG 24 hr tablet;  Take 1 tablet (300 mg total) by mouth daily. -     ALPRAZolam (XANAX) 1 MG tablet; Take 3 tablets (3 mg total) by mouth at bedtime as needed for anxiety.  B12 deficiency -     Vitamin B12; Future  Vitamin D deficiency -     VITAMIN D 25 Hydroxy (Vit-D Deficiency, Fractures); Future   . COVID-19 Education: The signs and symptoms of COVID-19 were discussed with the patient and how to seek care for testing if needed. The importance of social distancing was discussed today. . Reviewed expectations re: course of current medical issues. . Discussed self-management of symptoms. . Outlined signs and symptoms indicating need for more acute intervention. . Patient verbalized understanding and all questions were answered. Marland Kitchen Health Maintenance issues including appropriate healthy diet, exercise, and smoking avoidance were discussed with patient. . See orders for this visit as documented in the electronic medical record.  Briscoe Deutscher, DO  Records requested if  needed. Time spent: 25 minutes, of which >50% was spent in obtaining information about her symptoms, reviewing her previous labs, evaluations, and treatments, counseling her about her condition (please see the discussed topics above), and developing a plan to further investigate it; she had a number of questions which I addressed.

## 2018-12-04 ENCOUNTER — Encounter: Payer: Self-pay | Admitting: Family Medicine

## 2018-12-06 ENCOUNTER — Other Ambulatory Visit: Payer: Self-pay

## 2018-12-06 NOTE — Progress Notes (Signed)
Please see message in chart-refill was apparently not received by pharmacy.

## 2018-12-07 MED ORDER — OXYCODONE-ACETAMINOPHEN 10-325 MG PO TABS: 1.0000 | ORAL_TABLET | Freq: Three times a day (TID) | ORAL | 0 refills | Status: DC | PRN

## 2018-12-07 NOTE — Progress Notes (Signed)
Completed.

## 2018-12-10 ENCOUNTER — Encounter: Payer: Self-pay | Admitting: Family Medicine

## 2018-12-10 MED ORDER — OXYCODONE-ACETAMINOPHEN 10-325 MG PO TABS: 1.0000 | ORAL_TABLET | Freq: Three times a day (TID) | ORAL | 0 refills | Status: DC | PRN

## 2018-12-10 MED ORDER — ALPRAZOLAM 1 MG PO TABS: 3.0000 mg | ORAL_TABLET | Freq: Every evening | ORAL | 1 refills | Status: DC | PRN

## 2018-12-11 ENCOUNTER — Encounter: Payer: Self-pay | Admitting: Family Medicine

## 2018-12-11 ENCOUNTER — Ambulatory Visit (INDEPENDENT_AMBULATORY_CARE_PROVIDER_SITE_OTHER): Payer: Medicare Other

## 2018-12-11 ENCOUNTER — Ambulatory Visit (INDEPENDENT_AMBULATORY_CARE_PROVIDER_SITE_OTHER): Payer: Medicare Other | Admitting: Family Medicine

## 2018-12-11 ENCOUNTER — Other Ambulatory Visit: Payer: Self-pay

## 2018-12-11 VITALS — BP 120/80 | HR 94 | Temp 98.6°F | Ht 68.5 in | Wt 195.6 lb

## 2018-12-11 DIAGNOSIS — G8929 Other chronic pain: Secondary | ICD-10-CM | POA: Diagnosis not present

## 2018-12-11 DIAGNOSIS — E782 Mixed hyperlipidemia: Secondary | ICD-10-CM | POA: Diagnosis not present

## 2018-12-11 DIAGNOSIS — M5441 Lumbago with sciatica, right side: Secondary | ICD-10-CM | POA: Diagnosis not present

## 2018-12-11 DIAGNOSIS — M5442 Lumbago with sciatica, left side: Secondary | ICD-10-CM | POA: Diagnosis not present

## 2018-12-11 DIAGNOSIS — E559 Vitamin D deficiency, unspecified: Secondary | ICD-10-CM

## 2018-12-11 DIAGNOSIS — S31109D Unspecified open wound of abdominal wall, unspecified quadrant without penetration into peritoneal cavity, subsequent encounter: Secondary | ICD-10-CM | POA: Diagnosis not present

## 2018-12-11 DIAGNOSIS — E538 Deficiency of other specified B group vitamins: Secondary | ICD-10-CM | POA: Diagnosis not present

## 2018-12-11 DIAGNOSIS — M79672 Pain in left foot: Secondary | ICD-10-CM

## 2018-12-11 DIAGNOSIS — L089 Local infection of the skin and subcutaneous tissue, unspecified: Secondary | ICD-10-CM

## 2018-12-11 DIAGNOSIS — Z23 Encounter for immunization: Secondary | ICD-10-CM

## 2018-12-11 LAB — VITAMIN B12: Vitamin B-12: >1500 pg/mL — ABNORMAL HIGH (ref 211–911)

## 2018-12-11 LAB — CBC WITH DIFFERENTIAL/PLATELET
Basophils Absolute: 0 10*3/uL (ref 0.0–0.1)
Basophils Relative: 0.4 % (ref 0.0–3.0)
Eosinophils Absolute: 0.1 10*3/uL (ref 0.0–0.7)
Eosinophils Relative: 1.5 % (ref 0.0–5.0)
HCT: 39.4 % (ref 36.0–46.0)
Hemoglobin: 13.3 g/dL (ref 12.0–15.0)
Lymphocytes Relative: 23.6 % (ref 12.0–46.0)
Lymphs Abs: 1.5 10*3/uL (ref 0.7–4.0)
MCHC: 33.7 g/dL (ref 30.0–36.0)
MCV: 90.6 fl (ref 78.0–100.0)
Monocytes Absolute: 0.4 10*3/uL (ref 0.1–1.0)
Monocytes Relative: 6 % (ref 3.0–12.0)
Neutro Abs: 4.4 10*3/uL (ref 1.4–7.7)
Neutrophils Relative %: 68.5 % (ref 43.0–77.0)
Platelets: 198 10*3/uL (ref 150.0–400.0)
RBC: 4.35 Mil/uL (ref 3.87–5.11)
RDW: 13.2 % (ref 11.5–15.5)
WBC: 6.5 10*3/uL (ref 4.0–10.5)

## 2018-12-11 LAB — LIPID PANEL
Cholesterol: 164 mg/dL (ref 0–200)
HDL: 74.1 mg/dL (ref >39.00)
LDL Cholesterol: 76 mg/dL (ref 0–99)
NonHDL: 89.57
Total CHOL/HDL Ratio: 2
Triglycerides: 66 mg/dL (ref 0.0–149.0)
VLDL: 13.2 mg/dL (ref 0.0–40.0)

## 2018-12-11 LAB — COMPREHENSIVE METABOLIC PANEL
ALT: 10 U/L (ref 0–35)
AST: 13 U/L (ref 0–37)
Albumin: 4.3 g/dL (ref 3.5–5.2)
Alkaline Phosphatase: 67 U/L (ref 39–117)
BUN: 12 mg/dL (ref 6–23)
CO2: 28 mEq/L (ref 19–32)
Calcium: 9.6 mg/dL (ref 8.4–10.5)
Chloride: 105 mEq/L (ref 96–112)
Creatinine, Ser: 0.97 mg/dL (ref 0.40–1.20)
GFR: 57.62 mL/min — ABNORMAL LOW (ref >60.00)
Glucose, Bld: 83 mg/dL (ref 70–99)
Potassium: 4.5 mEq/L (ref 3.5–5.1)
Sodium: 142 mEq/L (ref 135–145)
Total Bilirubin: 0.8 mg/dL (ref 0.2–1.2)
Total Protein: 6.5 g/dL (ref 6.0–8.3)

## 2018-12-11 LAB — VITAMIN D 25 HYDROXY (VIT D DEFICIENCY, FRACTURES): VITD: 54.79 ng/mL (ref 30.00–100.00)

## 2018-12-11 NOTE — Patient Instructions (Addendum)
   morton's neuroma pads

## 2018-12-11 NOTE — Progress Notes (Signed)
Alyssa Deleon is a 63 y.o. female is here for follow up.  History of Present Illness:   HPI: See Assessment and Plan section for Problem Based Charting of issues discussed today.   Health Maintenance Due  Topic Date Due  . COLONOSCOPY  11/08/2003  . PAP SMEAR-Modifier  05/03/2013  . DEXA SCAN  11/08/2018  . PNA vac Low Risk Adult (1 of 2 - PCV13) 11/08/2018   Depression screen PHQ 2/9 11/28/2018  Decreased Interest 0  Down, Depressed, Hopeless 0  PHQ - 2 Score 0  Altered sleeping 0  Tired, decreased energy 0  Change in appetite 0  Feeling bad or failure about yourself  0  Trouble concentrating 0  Moving slowly or fidgety/restless 0  Suicidal thoughts 0  PHQ-9 Score 0  Difficult doing work/chores Not difficult at all   PMHx, SurgHx, SocialHx, FamHx, Medications, and Allergies were reviewed in the Visit Navigator and updated as appropriate.   Patient Active Problem List   Diagnosis Date Noted  . Genetic testing 03/27/2018  . Family history of breast cancer   . Family history of ovarian cancer   . Family history of prostate cancer   . Family history of melanoma   . Family history of throat cancer   . Personal history of breast cancer   . Bilateral lower extremity edema 02/03/2018  . Epidermoid cyst of skin 02/03/2018  . B12 deficiency 02/03/2018  . Mixed hyperlipidemia, on Zetia and Zocor 09/30/2017  . Chronic pain syndrome, on Neurontin 09/30/2017  . Vitamin D deficiency 09/30/2017  . Major depression, recurrent, chronic ([place]), followed by Psych, on Wellbutrin, Cymbalta 09/30/2017  . Panic disorder, followed by Psych, on Xanax 09/30/2017  . Dry eye syndrome of both eyes, on Restasis 09/30/2017  . History of stroke, on ASA 09/30/2017  . Cataract, left 09/29/2017  . Knee osteonecrosis, left ([place]) 09/29/2017  . Pain in right knee 07/09/2017  . Chronic low back pain 05/24/2017  . MVP (mitral valve prolapse) 03/13/2015  . GAD (generalized anxiety disorder)  03/11/2015  . PTSD (post-traumatic stress disorder) 03/11/2015  . 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 Nuclear sclerosis 06/10/2013  . 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 History of degenerative disc disease 10/27/2002  . History of brain tumor 09/01/1997  . History of breast cancer 03/28/1993   Social History   Tobacco Use  . Smoking status: Current Every Day Smoker    Packs/day: 0.00    Types: Cigarettes  . Smokeless tobacco: Never Used  Substance Use Topics  . Alcohol use: No    Alcohol/week: 0.0 standard drinks  . Drug use: Never   Current Medications and Allergies   .  ALPRAZolam (XANAX) 1 MG tablet, Take 3 tablets (3 mg total) by mouth at bedtime as needed for anxiety., Disp: 90 tablet, Rfl: 1 .  aspirin 500 MG tablet, Take by mouth., Disp: , Rfl:  .  buPROPion (WELLBUTRIN XL) 300 MG 24 hr tablet, Take 1 tablet (300 mg total) by mouth daily., Disp: 90 tablet, Rfl: 1 .  calcium-vitamin D (OSCAL WITH D) 500-200 MG-UNIT tablet, Take 1 tablet by mouth., Disp: , Rfl:  .  DULoxetine (CYMBALTA) 60 MG capsule, Take 1 capsule (60 mg total) by mouth daily., Disp: 90 capsule, Rfl: 1 .  ezetimibe (ZETIA) 10 MG tablet, Take 1 tablet (10 mg total) by mouth daily., Disp: 90 tablet, Rfl: 1 .  gabapentin (NEURONTIN) 300  MG capsule, Take 1 capsule (300 mg total) by mouth 2 (two) times daily., Disp: 180 capsule, Rfl: 3 .  oxyCODONE-acetaminophen (PERCOCET) 10-325 MG tablet, Take 1 tablet by mouth every 8 (eight) hours as needed for pain., Disp: 20 tablet, Rfl: 0 .  simvastatin (ZOCOR) 40 MG tablet, Take 1 tablet (40 mg total) by mouth daily., Disp: 90 tablet, Rfl: 3   Allergies  Allergen Reactions  . Hydrocodone-Acetaminophen Shortness Of Breath  . Klonopin [Clonazepam] Swelling  . Oxycodone Hcl   . Crestor [Rosuvastatin] Other (See Comments)    Myalgias  . Morphine Sulfate Itching  . Effexor [Venlafaxine] Other  (See Comments)    Weight gain  . Sulfa Antibiotics Diarrhea and Other (See Comments)  . Vytorin [Ezetimibe-Simvastatin] Other (See Comments)    Myalgias   Review of Systems   Pertinent items are noted in the HPI. Otherwise, a complete ROS is negative.  Vitals   Vitals:   12/11/18 1424  BP: 120/80  Pulse: 94  Temp: 98.6 F (37 C)  TempSrc: Oral  SpO2: 96%  Weight: 195 lb 9.6 oz (88.7 kg)  Height: 5' 8.5" (1.74 m)     Body mass index is 29.31 kg/m.  Physical Exam   Physical Exam Vitals signs and nursing note reviewed.  HENT:     Head: Normocephalic and atraumatic.  Eyes:     Pupils: Pupils are equal, round, and reactive to light.  Neck:     Musculoskeletal: Normal range of motion and neck supple.  Cardiovascular:     Rate and Rhythm: Normal rate and regular rhythm.     Heart sounds: Normal heart sounds.  Pulmonary:     Effort: Pulmonary effort is normal.  Abdominal:     Palpations: Abdomen is soft.  Musculoskeletal:     Comments: TTP metatarsal.  Skin:    General: Skin is warm.  Psychiatric:        Behavior: Behavior normal.       Assessment and Plan   Diagnoses and all orders for this visit:  Chronic wound infection of abdomen, subsequent encounter Comments: Improving now that she is not picking at it.   Flu vaccine need -     Flu Vaccine QUAD High Dose(Fluad)  Need for immunization against influenza -     Flu Vaccine QUAD High Dose(Fluad)  Mixed hyperlipidemia, on Zetia and Zocor -     Lipid panel -     Comprehensive metabolic panel -     CBC with Differential/Platelet  B12 deficiency -     Vitamin B12  Vitamin D deficiency -     VITAMIN D 25 Hydroxy (Vit-D Deficiency, Fractures)  Left foot pain Comments: Metarsalgia. Possible Morton's neuroma. Orders: -     DG Foot Complete Left  Chronic bilateral low back pain with bilateral sciatica Comments: Worsening. Pain radiates to both legs. Feels like they may give out. Due for oxycodone  refill. Gets 20/month to use sparingly.  Orders: -     DG Lumbar Spine Complete    . Orders and follow up as documented in [place], reviewed diet, exercise and weight control, cardiovascular risk and specific lipid/LDL goals reviewed, reviewed medications and side effects in detail.  . Reviewed expectations re: course of current medical issues. . Outlined signs and symptoms indicating need for more acute intervention. . Patient verbalized understanding and all questions were answered. . Patient received an After Visit Summary.  Briscoe Deutscher, DO [place] [place] [facility] 12/12/2018

## 2018-12-19 ENCOUNTER — Encounter: Payer: Self-pay | Admitting: Family Medicine

## 2019-04-16 ENCOUNTER — Telehealth: Payer: Self-pay | Admitting: Family Medicine

## 2019-04-16 DIAGNOSIS — E782 Mixed hyperlipidemia: Secondary | ICD-10-CM

## 2019-04-16 NOTE — Telephone Encounter (Signed)
MEDICATION: ezetimibe (ZETIA) 10 MG tablet simvastatin (ZOCOR) 40 MG tablet  PHARMACY: [place], [place] [employer] Phone:  [phone]  Fax:  [phone]       Comments: Patient has been scheduled for a TOC on 07/01/19.  Patient usually get sent in 90 days and 3 refills.  **Let patient know to contact pharmacy at the end of the day to make sure medication is ready. **  ** Please notify patient to allow 48-72 hours to process**  **Encourage patient to contact the pharmacy for refills or they can request refills through [facility]**

## 2019-04-17 MED ORDER — EZETIMIBE 10 MG PO TABS: 10.0000 mg | ORAL_TABLET | Freq: Every day | ORAL | 1 refills | Status: DC

## 2019-04-17 MED ORDER — SIMVASTATIN 40 MG PO TABS: 40.0000 mg | ORAL_TABLET | Freq: Every day | ORAL | 3 refills | Status: DC

## 2019-04-17 NOTE — Addendum Note (Signed)
Addended by: Orma Flaming on: 04/17/2019 08:58 AM   Modules accepted: Orders

## 2019-05-18 ENCOUNTER — Ambulatory Visit: Payer: Medicare Other | Attending: Internal Medicine

## 2019-05-18 DIAGNOSIS — Z23 Encounter for immunization: Secondary | ICD-10-CM | POA: Insufficient documentation

## 2019-05-18 NOTE — Progress Notes (Signed)
   Covid-19 Vaccination Clinic  Name:  Alyssa Deleon    MRN: [id number] DOB: 01-30-1956  05/18/2019  Alyssa Deleon was observed post Covid-19 immunization for 30 minutes based on pre-vaccination screening without incidence. She was provided with Vaccine Information Sheet and instruction to access the V-Safe system.   Alyssa Deleon was instructed to call 911 with any severe reactions post vaccine: Marland Kitchen Difficulty breathing  . Swelling of your face and throat  . A fast heartbeat  . A bad rash all over your body  . Dizziness and weakness    Immunizations Administered    Name Date Dose VIS Date Route   Pfizer COVID-19 Vaccine 05/18/2019  2:30 PM 0.3 mL 03/08/2019 Intramuscular   Manufacturer: [place]   Lot: [id number]   [place]: [id number]

## 2019-06-11 ENCOUNTER — Ambulatory Visit: Payer: Medicare Other | Attending: Internal Medicine

## 2019-06-11 DIAGNOSIS — Z23 Encounter for immunization: Secondary | ICD-10-CM

## 2019-06-11 NOTE — Progress Notes (Signed)
   Covid-19 Vaccination Clinic  Name:  Alyssa Deleon    MRN: [id number] DOB: [birth year]  06/11/2019  [name] was observed post Covid-19 immunization for 30 minutes based on pre-vaccination screening without incident. She was provided with Vaccine Information Sheet and instruction to access the V-Safe system.   [name] was instructed to call 911 with any severe reactions post vaccine: Marland Kitchen Difficulty breathing  . Swelling of face and throat  . A fast heartbeat  . A bad rash all over body  . Dizziness and weakness   Immunizations Administered    Name Date Dose VIS Date Route   Pfizer COVID-19 Vaccine 06/11/2019  1:42 PM 0.3 mL 03/08/2019 Intramuscular   Manufacturer: [place]   Lot: [id number]   [place]: [id number]

## 2019-06-20 ENCOUNTER — Encounter: Payer: Self-pay | Admitting: Family Medicine

## 2019-06-20 ENCOUNTER — Telehealth (INDEPENDENT_AMBULATORY_CARE_PROVIDER_SITE_OTHER): Payer: Medicare Other | Admitting: Family Medicine

## 2019-06-20 ENCOUNTER — Other Ambulatory Visit: Payer: Self-pay

## 2019-06-20 DIAGNOSIS — N3000 Acute cystitis without hematuria: Secondary | ICD-10-CM | POA: Diagnosis not present

## 2019-06-20 MED ORDER — NITROFURANTOIN MONOHYD MACRO 100 MG PO CAPS: 100.0000 mg | ORAL_CAPSULE | Freq: Two times a day (BID) | ORAL | 0 refills | Status: AC

## 2019-06-20 NOTE — Progress Notes (Signed)
Patient: Alyssa Deleon MRN: [id number] DOB: 06/30/1955 PCP: Briscoe Deutscher, DO     I connected with Alyssa Deleon on 06/20/19 at 3:15 pm by a video enabled telemedicine application and verified that I am speaking with the correct person using two identifiers.  Location patient: Home Location provider: [place]leon, Office Persons participating in this virtual visit: Alyssa Deleon and Alyssa Deleon   I discussed the limitations of evaluation and management by telemedicine and the availability of in person appointments. The patient expressed understanding and agreed to proceed.   Interactive audio and video telecommunications were attempted between this provider and patient, however failed, due to patient having technical difficulties OR patient did not have access to video capability.  We continued and completed visit with audio only.   Subjective:  Chief Complaint  Patient presents with  . Urinary Tract Infection    HPI: The patient is a 64 y.o. female who presents today for possible UTI. She states she used to get these when she was sexually active and really knows she has one. symptoms started yesterday morning. She has urgency and hesitancy. She has dysuria when she urinates and urine is cloudy. No blood or foul smell. No fever/chills and on CVA tenderness. No recent antibiotic use. She has not been hospitalized. Has had her covid shots. Last one was 3/16.   Review of Systems  Constitutional: Negative for chills and fever.  Genitourinary: Positive for difficulty urinating, dysuria and frequency. Negative for dyspareunia, flank pain, hematuria and urgency.    Allergies Patient is allergic to hydrocodone-acetaminophen; klonopin [clonazepam]; oxycodone hcl; crestor [rosuvastatin]; morphine sulfate; effexor [venlafaxine]; sulfa antibiotics; and vytorin [ezetimibe-simvastatin].  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), Major depression, recurrent, chronic ([place]), followed by Psych, on Wellbutrin, Cymbalta (09/30/2017), Mixed hyperlipidemia, on Zetia and Zocor (09/30/2017), 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  Surgical History Patient  has a past surgical history that includes Breast biopsy; Reconstruction breast w/ tram flap (Left, 2003); Mastectomy (Left, 1995); and Augmentation mammaplasty (Bilateral).  Family History [name]'s family history includes Alzheimer's disease in her maternal grandfather and mother; Breast cancer in her maternal grandmother, mother, paternal aunt, sister, and another family member; Breast cancer (age of onset: 63) in her sister; Cancer - Other in her brother, mother, sister, and sister; Cancer - Prostate in her father; Depression in her sister; Hearing loss in her sister; Heart attack in her paternal grandfather; Hyperlipidemia in her father and sister; Miscarriages / Stillbirths in her mother; Ovarian cancer in her paternal grandmother; Prostate cancer in her paternal uncle; Skin cancer in her mother and sister; Throat cancer in her  brother and father.  Social History Patient  reports that she has been smoking cigarettes. She has been smoking about 0.00 packs per day. She has never used smokeless  tobacco. She reports that she does not drink alcohol or use drugs.    Objective: There were no vitals filed for this visit.  There is no height or weight on file to calculate BMI.  Physical Exam     Assessment/plan: 1. Acute cystitis without hematuria Sounds typical of UTI. Allergy to bactrim. Will do 5 days of macrobid. Renal function was wnl on last check. Precautions given. If not getting better will need to come in for culture.     Return if symptoms worsen or fail to improve.  Records requested if needed. Time spent with patient: 15 minutes, of which >50% was spent in obtaining information about her symptoms, reviweing her previous labs, evaluations, and treatments, counseling her about her conditions (please see discussed topics above), and developing a plan to further investigate it; she had a number of questions which I addressed.    Orma Flaming, MD [place]  06/20/2019

## 2019-07-01 ENCOUNTER — Encounter: Payer: Self-pay | Admitting: Family Medicine

## 2019-07-01 ENCOUNTER — Ambulatory Visit (INDEPENDENT_AMBULATORY_CARE_PROVIDER_SITE_OTHER): Payer: Medicare Other | Admitting: Family Medicine

## 2019-07-01 ENCOUNTER — Other Ambulatory Visit: Payer: Self-pay

## 2019-07-01 ENCOUNTER — Telehealth: Payer: Self-pay | Admitting: Family Medicine

## 2019-07-01 VITALS — BP 122/80 | HR 94 | Temp 98.1°F | Ht 68.5 in | Wt 198.4 lb

## 2019-07-01 DIAGNOSIS — R269 Unspecified abnormalities of gait and mobility: Secondary | ICD-10-CM

## 2019-07-01 DIAGNOSIS — G894 Chronic pain syndrome: Secondary | ICD-10-CM

## 2019-07-01 DIAGNOSIS — Z1211 Encounter for screening for malignant neoplasm of colon: Secondary | ICD-10-CM | POA: Diagnosis not present

## 2019-07-01 DIAGNOSIS — E782 Mixed hyperlipidemia: Secondary | ICD-10-CM

## 2019-07-01 DIAGNOSIS — N3000 Acute cystitis without hematuria: Secondary | ICD-10-CM | POA: Diagnosis not present

## 2019-07-01 LAB — COMPREHENSIVE METABOLIC PANEL
ALT: 11 U/L (ref 0–35)
AST: 16 U/L (ref 0–37)
Albumin: 4.4 g/dL (ref 3.5–5.2)
Alkaline Phosphatase: 75 U/L (ref 39–117)
BUN: 11 mg/dL (ref 6–23)
CO2: 30 mEq/L (ref 19–32)
Calcium: 9.2 mg/dL (ref 8.4–10.5)
Chloride: 104 mEq/L (ref 96–112)
Creatinine, Ser: 1.04 mg/dL (ref 0.40–1.20)
GFR: 53.08 mL/min — ABNORMAL LOW (ref >60.00)
Glucose, Bld: 100 mg/dL — ABNORMAL HIGH (ref 70–99)
Potassium: 5.3 mEq/L — ABNORMAL HIGH (ref 3.5–5.1)
Sodium: 142 mEq/L (ref 135–145)
Total Bilirubin: 0.5 mg/dL (ref 0.2–1.2)
Total Protein: 7 g/dL (ref 6.0–8.3)

## 2019-07-01 LAB — CBC WITH DIFFERENTIAL/PLATELET
Basophils Absolute: 0 10*3/uL (ref 0.0–0.1)
Basophils Relative: 0.5 % (ref 0.0–3.0)
Eosinophils Absolute: 0.1 10*3/uL (ref 0.0–0.7)
Eosinophils Relative: 1.7 % (ref 0.0–5.0)
HCT: 41.4 % (ref 36.0–46.0)
Hemoglobin: 13.6 g/dL (ref 12.0–15.0)
Lymphocytes Relative: 26.9 % (ref 12.0–46.0)
Lymphs Abs: 1.9 10*3/uL (ref 0.7–4.0)
MCHC: 32.7 g/dL (ref 30.0–36.0)
MCV: 92.3 fl (ref 78.0–100.0)
Monocytes Absolute: 0.5 10*3/uL (ref 0.1–1.0)
Monocytes Relative: 6.7 % (ref 3.0–12.0)
Neutro Abs: 4.6 10*3/uL (ref 1.4–7.7)
Neutrophils Relative %: 64.2 % (ref 43.0–77.0)
Platelets: 246 10*3/uL (ref 150.0–400.0)
RBC: 4.49 Mil/uL (ref 3.87–5.11)
RDW: 13.1 % (ref 11.5–15.5)
WBC: 7.1 10*3/uL (ref 4.0–10.5)

## 2019-07-01 LAB — POCT URINALYSIS DIPSTICK
Bilirubin, UA: POSITIVE
Blood, UA: NEGATIVE
Glucose, UA: NEGATIVE
Ketones, UA: POSITIVE
Nitrite, UA: NEGATIVE
Protein, UA: POSITIVE — AB
Spec Grav, UA: 1.015 (ref 1.010–1.025)
Urobilinogen, UA: 0.2 U/dL
pH, UA: 6.5 (ref 5.0–8.0)

## 2019-07-01 LAB — TSH: TSH: 0.42 u[IU]/mL (ref 0.35–4.50)

## 2019-07-01 LAB — VITAMIN B12: Vitamin B-12: 869 pg/mL (ref 211–911)

## 2019-07-01 MED ORDER — OXYCODONE-ACETAMINOPHEN 10-325 MG PO TABS: 1.0000 | ORAL_TABLET | Freq: Three times a day (TID) | ORAL | 0 refills | Status: DC | PRN

## 2019-07-01 MED ORDER — SIMVASTATIN 40 MG PO TABS: 40.0000 mg | ORAL_TABLET | Freq: Every day | ORAL | 3 refills | Status: DC

## 2019-07-01 MED ORDER — EZETIMIBE 10 MG PO TABS: 10.0000 mg | ORAL_TABLET | Freq: Every day | ORAL | 3 refills | Status: DC

## 2019-07-01 NOTE — Telephone Encounter (Signed)
Patient called in and stated that she was taking SYSTANE COMPLETE for her eye drops.

## 2019-07-01 NOTE — Telephone Encounter (Signed)
Noted. Medication has been added to the patient's chart.

## 2019-07-01 NOTE — Patient Instructions (Addendum)
-  try voltaren gel on your hands.   -sent [facility] drugs to [facility] -sent oxycodone to your local pharmacy.   -checking labs, urine today. I do recommend we do MRI of your brain to see why your gait is so off. If you prefer we can do a neurology referral instead.   -I think PT would be really helpful. We can wait on this as well.   Good to see you in person!  Dr. Rogers Blocker

## 2019-07-01 NOTE — Progress Notes (Signed)
Patient: Alyssa Deleon MRN: [id number] DOB: 06-29-1955 PCP: Orma Flaming, MD     Subjective:  Chief Complaint  Patient presents with  . Transitions Of Care  . gait instability  . Pain    HPI: The patient is a 64 y.o. female who presents today for Transition of Care. She has multiple health issues and is followed by psychiatry.   She has chronic back pain that dr. [name] gives her 20 of oxycodone that typically lasts her 6 months. She has signed a Glass blower/designer. Is needing a refill of this. Has allergy to oxycodone, but apparently tolerates it fine.   She has complaints of feeling off balance. She states she has been home for a year and has been sitting more than usual. She first noticed this about one month ago. She states when she gets up out of the chair she takes a few steps and is off balance and sways side to side. Her friend stated she looks like she is drunk. She is not dizzy and does not feel like she is going to fall. She can not walk in a straight line. She is out of shape and is always weak. Nothing from baseline. No nausea, vomiting or vision changes from her baseline. I did treat her for a urine infection and not sure if this is contributing.   She also has arthritis in her hands.   HM reviewed. Due for colonoscopy. Needs pneumonia shot, but not 3 weeks out from her covid shots. Does not recall when her last pap smear was. ? If 2012. Needs this done.   Review of Systems  Constitutional: Negative for chills, fatigue and fever.  HENT: Negative for sneezing and sore throat.   Respiratory: Negative for cough, shortness of breath and wheezing.   Gastrointestinal: Negative for abdominal pain, nausea and vomiting.  Musculoskeletal: Positive for arthralgias.  Neurological: Positive for weakness. Negative for dizziness, tremors, light-headedness, numbness and headaches.       Gait instability     Allergies Patient is allergic to hydrocodone-acetaminophen; klonopin  [clonazepam]; oxycodone hcl; crestor [rosuvastatin]; morphine sulfate; effexor [venlafaxine]; sulfa antibiotics; and vytorin [ezetimibe-simvastatin].  Past Medical History Patient  has a past medical history of Breast cancer ([place]), Cataract, left (09/29/2017), Chronic pain syndrome, on Neurontin (09/30/2017), Dry eye syndrome of both eyes, on Restasis (09/30/2017), Family history of breast cancer, Family history of melanoma, Family history of ovarian cancer, Family history of prostate cancer, Family history of throat cancer, Frequent urinary tract infections, GAD (generalized anxiety disorder) (03/11/2015), Genital herpes, History of chicken pox, History of degenerative disc disease (10/27/2002), Knee osteonecrosis, left ([place]) (09/29/2017), Major depression, recurrent, chronic ([place]), followed by Psych, on Wellbutrin, Cymbalta (09/30/2017), Mixed hyperlipidemia, on Zetia and Zocor (09/30/2017), 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  Surgical History Patient  has a past surgical history that includes Breast biopsy; Reconstruction breast w/ tram flap (Left, 2003); Mastectomy (Left, 1995); and Augmentation mammaplasty (Bilateral).  Family History [name]'s family history includes Alzheimer's disease in her maternal grandfather and mother; Breast cancer in her maternal grandmother, mother, paternal aunt, sister, and another family member; Breast cancer (age of onset: 32) in her sister; Cancer - Other in her brother, mother, sister, and sister; Cancer - Prostate in  her father; Depression in her sister; Hearing loss in her sister; Heart attack in  her paternal grandfather; Hyperlipidemia in her father and sister; Miscarriages / Stillbirths in her mother; Ovarian cancer in her paternal grandmother; Prostate cancer in her paternal uncle; Skin cancer in her mother and sister; Throat cancer in her brother and father.  Social History Patient  reports that she has been smoking cigarettes. She has been smoking about 0.00 packs per day. She has never used smokeless tobacco. She reports that she does not drink alcohol or use drugs.    Objective: Vitals:   07/01/19 1046  BP: 122/80  Pulse: 94  Temp: 98.1 F (36.7 C)  TempSrc: Temporal  SpO2: 94%  Weight: 198 lb 6.4 oz (90 kg)  Height: 5' 8.5" (1.74 m)    Body mass index is 29.73 kg/m.  Physical Exam Vitals reviewed.  Constitutional:      Appearance: Normal appearance. She is obese.     Comments: Strong tobacco odor   HENT:     Head: Normocephalic and atraumatic.     Right Ear: Tympanic membrane, ear canal and external ear normal.     Left Ear: Tympanic membrane, ear canal and external ear normal.     Mouth/Throat:     Mouth: Mucous membranes are moist.     Comments: No teeth  Eyes:     Extraocular Movements: Extraocular movements intact.     Pupils: Pupils are equal, round, and reactive to light.  Cardiovascular:     Rate and Rhythm: Normal rate and regular rhythm.     Heart sounds: Normal heart sounds.  Pulmonary:     Effort: Pulmonary effort is normal.     Breath sounds: Normal breath sounds.  Abdominal:     General: Bowel sounds are normal.     Palpations: Abdomen is soft.  Musculoskeletal:     Cervical back: Normal range of motion and neck supple.  Neurological:     General: No focal deficit present.     Mental Status: She is alert and oriented to person, place, and time.     Cranial Nerves: No cranial nerve deficit.     Sensory: No sensory deficit.     Gait: Gait abnormal.     Deep Tendon Reflexes: Reflexes normal.     Comments: diadochokinesis normal on left,  her right side is abnormal but she states this is at her baseline from previous stroke.   Negative pronator drift.   She can not walk in straight line   Psychiatric:        Mood and Affect: Mood normal.        Behavior: Behavior normal.        Assessment/plan: 1. Mixed hyperlipidemia, on Zetia and Zocor Refilled medication for her. To goal on last check.  - ezetimibe (ZETIA) 10 MG tablet; Take 1 tablet (10 mg total) by mouth daily.  Dispense: 90 tablet; Refill: 3 - simvastatin (ZOCOR) 40 MG tablet; Take 1 tablet (40 mg total) by mouth daily.  Dispense: 90 tablet; Refill: 3  2. Chronic pain syndrome, on Neurontin pmp website verified. Last filled 11/2018. Gave her 20 pills. Drug contract has already been signed.   3. Colon cancer screening  - Ambulatory referral to Gastroenterology  4. Acute cystitis without hematuria  - Urine Culture - POCT urinalysis dipstick  5. Gait disorder Will repeat urine to make sure not contributing. Checking other labs as well. Discussed needs MRI of her head vs. Neurology referral. She  is going to think about this and let me know when I call with results, but I do think a MRI is strongly warranted in this case. PT would also be beneficial.  - CBC with Differential/Platelet - Comprehensive metabolic panel - Vitamin [id number] - TSH      This visit occurred during the SARS-CoV-2 public health emergency.  Safety protocols were in place, including screening questions prior to the visit, additional usage of staff PPE, and extensive cleaning of exam room while observing appropriate contact time as indicated for disinfecting solutions.     Return in about 3 months (around 09/30/2019) for pap/pneumonia shot. Orma Flaming, MD [place]   07/01/2019

## 2019-07-02 LAB — URINE CULTURE
MICRO NUMBER:: 10327609
Result:: NO GROWTH
SPECIMEN QUALITY:: ADEQUATE

## 2019-07-03 ENCOUNTER — Other Ambulatory Visit: Payer: Self-pay | Admitting: Family Medicine

## 2019-07-03 DIAGNOSIS — E875 Hyperkalemia: Secondary | ICD-10-CM

## 2019-07-09 ENCOUNTER — Other Ambulatory Visit: Payer: Self-pay | Admitting: Family Medicine

## 2019-07-09 DIAGNOSIS — Z1231 Encounter for screening mammogram for malignant neoplasm of breast: Secondary | ICD-10-CM

## 2019-08-05 ENCOUNTER — Telehealth: Payer: Self-pay | Admitting: Family Medicine

## 2019-08-05 ENCOUNTER — Other Ambulatory Visit: Payer: Self-pay | Admitting: *Deleted

## 2019-08-05 ENCOUNTER — Other Ambulatory Visit: Payer: Self-pay | Admitting: Family Medicine

## 2019-08-05 DIAGNOSIS — R269 Unspecified abnormalities of gait and mobility: Secondary | ICD-10-CM

## 2019-08-05 NOTE — Telephone Encounter (Signed)
Please let her know I put in order and they should call her sometime this week to schedule.  Thanks,  Dr. Rogers Blocker

## 2019-08-05 NOTE — Telephone Encounter (Signed)
Please advise 

## 2019-08-05 NOTE — Telephone Encounter (Signed)
Patient notified

## 2019-08-05 NOTE — Telephone Encounter (Signed)
Patient Calling Stating she has decided to get the MRI Dr Rogers Blocker had suggested. Please Advise

## 2019-08-06 ENCOUNTER — Other Ambulatory Visit: Payer: Self-pay

## 2019-08-06 DIAGNOSIS — E875 Hyperkalemia: Secondary | ICD-10-CM

## 2019-08-07 ENCOUNTER — Other Ambulatory Visit: Payer: Medicare Other

## 2019-08-07 ENCOUNTER — Ambulatory Visit: Payer: Medicare Other

## 2019-09-03 ENCOUNTER — Other Ambulatory Visit: Payer: Self-pay

## 2019-09-03 ENCOUNTER — Ambulatory Visit
Admission: RE | Admit: 2019-09-03 | Discharge: 2019-09-03 | Disposition: A | Discharge status: Home / Self Care | Payer: Medicare Other | Source: Ambulatory Visit | Attending: Family Medicine | Admitting: Family Medicine

## 2019-09-03 DIAGNOSIS — R269 Unspecified abnormalities of gait and mobility: Secondary | ICD-10-CM

## 2019-09-03 MED ORDER — GADOBENATE DIMEGLUMINE 529 MG/ML IV SOLN
18.0000 mL | Freq: Once | INTRAVENOUS | Status: AC | PRN
  Administered 2019-09-03: 18 mL via INTRAVENOUS

## 2019-09-04 ENCOUNTER — Encounter: Payer: Self-pay | Admitting: Family Medicine

## 2019-09-05 ENCOUNTER — Other Ambulatory Visit: Payer: Self-pay | Admitting: Family Medicine

## 2019-09-05 DIAGNOSIS — Z87898 Personal history of other specified conditions: Secondary | ICD-10-CM

## 2019-09-05 DIAGNOSIS — R930 Abnormal findings on diagnostic imaging of skull and head, not elsewhere classified: Secondary | ICD-10-CM

## 2019-09-19 ENCOUNTER — Other Ambulatory Visit: Payer: Self-pay

## 2019-09-19 ENCOUNTER — Telehealth: Payer: Self-pay | Admitting: Family Medicine

## 2019-09-19 DIAGNOSIS — E782 Mixed hyperlipidemia: Secondary | ICD-10-CM

## 2019-09-19 MED ORDER — EZETIMIBE 10 MG PO TABS: 10.0000 mg | ORAL_TABLET | Freq: Every day | ORAL | 0 refills | Status: DC

## 2019-09-19 NOTE — Telephone Encounter (Signed)
  LAST APPOINTMENT DATE: 07/01/2019  NEXT APPOINTMENT DATE:@7 /09/2019  MEDICATION: GENERIC - ezetimibe (ZETIA) 10 MG tablet -   PHARMACY: [id number] - [place], [place] - [address] Phone:  [phone]

## 2019-09-19 NOTE — Telephone Encounter (Signed)
Refill sent to pharmacy.   

## 2019-09-20 ENCOUNTER — Encounter: Payer: Self-pay | Admitting: Family Medicine

## 2019-09-21 ENCOUNTER — Emergency Department: Admit: 2019-09-21 | Payer: PRIVATE HEALTH INSURANCE

## 2019-09-21 ENCOUNTER — Inpatient Hospital Stay: Admit: 2019-09-21 | Discharge: 2019-09-21 | Payer: BLUE CROSS/BLUE SHIELD

## 2019-09-21 DIAGNOSIS — S43401A Unspecified sprain of right shoulder joint, initial encounter: Secondary | ICD-10-CM

## 2019-09-21 DIAGNOSIS — M25511 Pain in right shoulder: Principal | ICD-10-CM

## 2019-09-21 MED ORDER — ACETAMINOPHEN 325 MG TABLET
325 mg | ORAL_TABLET | Freq: Four times a day (QID) | ORAL | 1 refills | Status: AC | PRN
Start: 2019-09-21 — End: ?

## 2019-09-21 MED ORDER — NAPROXEN 250 MG TABLET
250 mg | Freq: Once | ORAL | Status: CP
Start: 2019-09-21 — End: ?
  Administered 2019-09-21: 11:00:00 250 mg via ORAL

## 2019-09-21 MED ORDER — NAPROXEN 500 MG TABLET
500 mg | ORAL_TABLET | Freq: Two times a day (BID) | ORAL | 1 refills | Status: AC | PRN
Start: 2019-09-21 — End: ?

## 2019-09-21 MED ORDER — ACETAMINOPHEN 325 MG TABLET
325 mg | Freq: Once | ORAL | Status: CP
Start: 2019-09-21 — End: ?
  Administered 2019-09-21: 10:00:00 325 mg via ORAL

## 2019-09-21 NOTE — ED Provider Notes
HistoryChief Complaint Patient presents with ? Shoulder Pain   Presents to ED for eval of right shoulder pain worsening since yesterday. Patient reports lifted a piece of plywood from her truck on thursday and must have injured her shoulder. Has not taken any otc meds.  The history is provided by the patient and medical records. No language interpreter was used. Arm InjuryLocation:  ShoulderShoulder location:  R shoulderInjury: no  Pain details:   Quality:  Aching and dull  Radiates to:  Does not radiate  Onset quality:  Gradual  Duration:  2 daysHandedness:  Right-handedDislocation: no  Foreign body present:  No foreign bodiesPrior injury to area:  NoRelieved by:  RestWorsened by:  MovementIneffective treatments:  None triedAssociated symptoms: decreased range of motion (right shoulder since injury)  Associated symptoms: no back pain, no fatigue, no fever, no muscle weakness, no neck pain, no numbness, no stiffness, no swelling and no tingling  Risk factors: no frequent fractures and no recent illness   No past medical history on file.No past surgical history on file.No family history on file.Social History Socioeconomic History ? Marital status: Married   Spouse name: Not on file ? Number of children: Not on file ? Years of education: Not on file ? Highest education level: Not on file No existing history information found.No existing history information found.No existing history information found.Review of Systems Constitutional: Negative for fatigue and fever. Musculoskeletal: Positive for arthralgias (right shoulder pain). Negative for back pain, joint swelling, neck pain, neck stiffness and stiffness. Skin: Negative for rash and wound. Neurological: Negative for dizziness, weakness, numbness and headaches. All other systems reviewed and are negative. Physical ExamED Triage Vitals [09/21/19 0439]BP: (!) 150/84Pulse: 68Pulse from  O2 sat: n/aResp: 18Temp: 98.6 ?F (37 ?C)Temp src: OralSpO2: 98 % BP (!) 150/84  - Pulse 68  - Temp 98.6 ?F (37 ?C) (Oral)  - Resp 18  - Wt 45 kg  - SpO2 98% Physical ExamVitals signs and nursing note reviewed. Exam conducted with a chaperone present. Constitutional:     General: She is not in acute distress.   Appearance: She is well-developed. She is not ill-appearing, toxic-appearing or diaphoretic. HENT:    Head: Normocephalic and atraumatic.    Mouth/Throat:    Lips: Pink.    Mouth: Mucous membranes are moist. Eyes:    General: No scleral icterus.   Pupils: Pupils are equal, round, and reactive to light. Neck:    Musculoskeletal: Full passive range of motion without pain, normal range of motion and neck supple. No neck rigidity. Cardiovascular:    Rate and Rhythm: Normal rate.    Pulses: Normal pulses. Pulmonary:    Effort: Pulmonary effort is normal. No accessory muscle usage, prolonged expiration or respiratory distress. Chest:    Chest wall: No tenderness. Abdominal:    Palpations: Abdomen is soft.    Tenderness: There is no abdominal tenderness. Musculoskeletal:       General: No tenderness.    Right shoulder: She exhibits decreased range of motion and bony tenderness. She exhibits no swelling, no effusion, no crepitus, no deformity, no laceration, normal pulse and normal strength.    Right elbow: Normal.   Right wrist: Normal.    Cervical back: Normal.    Thoracic back: Normal.    Lumbar back: Normal.    Right forearm: Normal.      Arms:   Right hand: Normal. Normal sensation noted. Normal strength noted.    Right lower leg: No edema.    Left lower  leg: No edema. Skin:   General: Skin is warm and dry.    Capillary Refill: Capillary refill takes less than 2 seconds.    Coloration: Skin is not jaundiced or pale.    Findings: No ecchymosis, erythema or rash. Neurological:    Mental Status: She is alert and oriented to person, place, and time.    GCS: GCS eye subscore is 4. GCS verbal subscore is 5. GCS motor subscore is 6.    Motor: Motor function is intact.    Coordination: Coordination is intact.    Gait: Gait is intact. Psychiatric:       Attention and Perception: Attention normal.       Mood and Affect: Mood normal.       Speech: Speech normal.       Behavior: Behavior is cooperative.  ProceduresProcedures ED COURSEReviewed previous: previous chartInterpreted by ED Provider: pulse oximetryPatient Reevaluation: ED RM A6HChief Complaint:Patient presents with:Shoulder Pain: Presents to ED for eval of right shoulder pain worsening since yesterday. Patient reports lifted a piece of plywood from her truck on thursday and must have injured her shoulder. Has not taken any otc meds.HPI:64 year old right-handed female presents to ER complaining of right shoulder pain since Thursday.  Patient noted Thursday evening she picked up a 4x8 ft plywood out of her pickup truck.  Upon lifting the plywood immediate acute onset of pain to the right anterior shoulder has been constant, seems to radiate down the arm and noted increased pain with range of motion, local palpation.  Some improvement of pain at rest.  States no other muscle/joint pain or swelling.  States no focal weakness numbness paresthesia, no headache, no neck pain, no cardiac complaints no abdomen/back pain, no nausea vomiting diarrhea no fever chills.  States pain not aggravated by exertion.  States no previous history same.  States no other recent illness, no other recent trauma/assault, no recent surgeries. MDM/DDx:Right shoulder sprain / fracture, doubt dislocationLabs/Imaging:  X-rayInterventions/Tx: Pain controlIce therapyED Course:Patient remained stable, pain controlled, neurovascular intact.  No new complaint. All results reviewed with her.  She agrees with discharge home.. She has been instructed follow up closely medical clinic for repeat evaluation further outpatient testing of continued pain.  She has been instructed to remove sling daily to perform range-of-motion exercise.  She had no further question or concern. She has been instructed return if develop increased/persistent pain, not improving. Kizzie Ide, D.OAttending Emergency Med [phone]:  This chart was created using M-modal dictation software. Efforts were made by me to ensure accuracy, however some errors may be present due to limitation of this technology and occasionally words are not transcribed as intended. Critical care provided by attending: no critical carePatient progress: stableClinical Impressions as of Sep 20 640 Sprain of right shoulder, unspecified shoulder sprain type, initial encounter  ED DispositionDischarge Kizzie Ide, MD06/26/21 [phone]

## 2019-09-21 NOTE — Discharge Instructions
Must follow-up medical clinic for repeat evaluation. Must follow up with your medical doctor for repeat exam, possible referral for outpatient physical therapy, outpatient MRI right shoulder and / or referral to shoulder specialist for further evaluation regarding your shoulder painReturn to ED if develop increase / persistent right shoulder pain, difficulty to using your extremity , weakness / numbness of your extremities, not improving well, feverContinue with daily range-of-motion exercises as discussed in the ER few right shoulder

## 2019-09-21 NOTE — ED Notes
4:52 AM Chief Complaint Patient presents with ? Shoulder Pain   Presents to ED for eval of right shoulder pain worsening since yesterday. Patient reports lifted a piece of plywood from her truck on thursday and must have injured her shoulder. Has not taken any otc meds. Awaiting MD assessment.6:02 AM PT transported to imaging by hospital transport.6:40 AM MD at bedside.6:47 AM Pt medicated per MAR. Shoulder sling given to patient.6:53 AM PT discharged by provider. VSS, in NAD, AAOx4. PT verbalized understanding of AVS and return to ED precaution teaching and denies further questions at this time. PT ambulated out of ED with steady gait.

## 2019-09-23 ENCOUNTER — Telehealth: Payer: Self-pay | Admitting: Family Medicine

## 2019-09-23 ENCOUNTER — Ambulatory Visit: Payer: Medicare Other | Admitting: Physician Assistant

## 2019-09-23 NOTE — Telephone Encounter (Signed)
Error.  Orma Flaming, MD [place]

## 2019-09-23 NOTE — Telephone Encounter
Courtesy phone call made to discuss how pt is doing  and discuss f/u needs. Message left for pt to return call.Patient does not have a PCP listed.

## 2019-10-02 ENCOUNTER — Ambulatory Visit: Payer: Medicare Other | Admitting: Family Medicine

## 2019-10-08 ENCOUNTER — Encounter: Payer: Self-pay | Admitting: Family Medicine

## 2019-10-29 ENCOUNTER — Encounter: Payer: Self-pay | Admitting: Family Medicine

## 2019-10-29 DIAGNOSIS — G8929 Other chronic pain: Secondary | ICD-10-CM

## 2019-11-01 ENCOUNTER — Other Ambulatory Visit: Payer: Self-pay

## 2019-11-11 ENCOUNTER — Telehealth: Payer: Self-pay | Admitting: Family Medicine

## 2019-11-11 NOTE — Telephone Encounter (Signed)
Pt has an appointment at beginning of September at [facility]. That is the soonest I can get her scheduled. Pt is welcome to call over and see if they have any cancellations.

## 2019-11-11 NOTE — Telephone Encounter (Signed)
Spoke to pt and scheduled her for her AWV. Pt is requesting pain management referral asap as there was a misunderstanding a few weeks ago with a referral to physical therapy.  Pt states she is in severe pain and her oxycodone does not help with pain and only puts her to sleep.   Please advise

## 2019-11-14 ENCOUNTER — Ambulatory Visit (INDEPENDENT_AMBULATORY_CARE_PROVIDER_SITE_OTHER): Payer: Medicare Other

## 2019-11-14 DIAGNOSIS — Z23 Encounter for immunization: Secondary | ICD-10-CM

## 2019-11-14 DIAGNOSIS — Z1211 Encounter for screening for malignant neoplasm of colon: Secondary | ICD-10-CM | POA: Diagnosis not present

## 2019-11-14 DIAGNOSIS — Z0000 Encounter for general adult medical examination without abnormal findings: Secondary | ICD-10-CM

## 2019-11-14 DIAGNOSIS — Z1231 Encounter for screening mammogram for malignant neoplasm of breast: Secondary | ICD-10-CM

## 2019-11-14 DIAGNOSIS — E2839 Other primary ovarian failure: Secondary | ICD-10-CM

## 2019-11-14 NOTE — Patient Instructions (Signed)
Alyssa Deleon , Thank you for taking time to come for your Medicare Wellness Visit. I appreciate your ongoing commitment to your health goals. Please review the following plan we discussed and let me know if I can assist you in the future.   Screening recommendations/referrals: Colonoscopy: Ordered cologuard 11/14/19 Mammogram: Ordered 11/14/19 Bone Density: Ordered  11/14/19  Recommended yearly ophthalmology/optometry visit for glaucoma screening and checkup Recommended yearly dental visit for hygiene and checkup  Vaccinations: Influenza vaccine: Up to date Pneumococcal vaccine: Due and Discussed Tdap vaccine: Up to date Shingles vaccine: Shingrix discussed. Please contact your pharmacy for coverage information.    Covid-19:Completed 2/20 & 06/11/19  Advanced directives: Please bring a copy of your health care power of attorney and living will to the office at your convenience.  Conditions/risks identified: Get back to more activity and exercise when back pain is managed  Next appointment: Follow up in one year for your annual wellness visit    Preventive Care 65 Years and Older, Female Preventive care refers to lifestyle choices and visits with your health care provider that can promote health and wellness. What does preventive care include?  A yearly physical exam. This is also called an annual well check.  Dental exams once or twice a year.  Routine eye exams. Ask your health care provider how often you should have your eyes checked.  Personal lifestyle choices, including:  Daily care of your teeth and gums.  Regular physical activity.  Eating a healthy diet.  Avoiding tobacco and drug use.  Limiting alcohol use.  Practicing safe sex.  Taking low-dose aspirin every day.  Taking vitamin and mineral supplements as recommended by your health care provider. What happens during an annual well check? The services and screenings done by your health care provider during  your annual well check will depend on your age, overall health, lifestyle risk factors, and family history of disease. Counseling  Your health care provider may ask you questions about your:  Alcohol use.  Tobacco use.  Drug use.  Emotional well-being.  Home and relationship well-being.  Sexual activity.  Eating habits.  History of falls.  Memory and ability to understand (cognition).  Work and work Statistician.  Reproductive health. Screening  You may have the following tests or measurements:  Height, weight, and BMI.  Blood pressure.  Lipid and cholesterol levels. These may be checked every 5 years, or more frequently if you are over 23 years old.  Skin check.  Lung cancer screening. You may have this screening every year starting at age 30 if you have a 30-pack-year history of smoking and currently smoke or have quit within the past 15 years.  Fecal occult blood test (FOBT) of the stool. You may have this test every year starting at age 31.  Flexible sigmoidoscopy or colonoscopy. You may have a sigmoidoscopy every 5 years or a colonoscopy every 10 years starting at age 20.  Hepatitis C blood test.  Hepatitis B blood test.  Sexually transmitted disease (STD) testing.  Diabetes screening. This is done by checking your blood sugar (glucose) after you have not eaten for a while (fasting). You may have this done every 1-3 years.  Bone density scan. This is done to screen for osteoporosis. You may have this done starting at age 60.  Mammogram. This may be done every 1-2 years. Talk to your health care provider about how often you should have regular mammograms. Talk with your health care provider about your test results,  treatment options, and if necessary, the need for more tests. Vaccines  Your health care provider may recommend certain vaccines, such as:  Influenza vaccine. This is recommended every year.  Tetanus, diphtheria, and acellular pertussis (Tdap,  Td) vaccine. You may need a Td booster every 10 years.  Zoster vaccine. You may need this after age 22.  Pneumococcal 13-valent conjugate (PCV13) vaccine. One dose is recommended after age 3.  Pneumococcal polysaccharide (PPSV23) vaccine. One dose is recommended after age 95. Talk to your health care provider about which screenings and vaccines you need and how often you need them. This information is not intended to replace advice given to you by your health care provider. Make sure you discuss any questions you have with your health care provider. Document Released: 04/10/2015 Document Revised: 12/02/2015 Document Reviewed: 01/13/2015 Elsevier Interactive Patient Education  2017 [place] Prevention in the Home Falls can cause injuries. They can happen to people of all ages. There are many things you can do to make your home safe and to help prevent falls. What can I do on the outside of my home?  Regularly fix the edges of walkways and driveways and fix any cracks.  Remove anything that might make you trip as you walk through a door, such as a raised step or threshold.  Trim any bushes or trees on the path to your home.  Use bright outdoor lighting.  Clear any walking paths of anything that might make someone trip, such as rocks or tools.  Regularly check to see if handrails are loose or broken. Make sure that both sides of any steps have handrails.  Any raised decks and porches should have guardrails on the edges.  Have any leaves, snow, or ice cleared regularly.  Use sand or salt on walking paths during winter.  Clean up any spills in your garage right away. This includes oil or grease spills. What can I do in the bathroom?  Use night lights.  Install grab bars by the toilet and in the tub and shower. Do not use towel bars as grab bars.  Use non-skid mats or decals in the tub or shower.  If you need to sit down in the shower, use a plastic, non-slip  stool.  Keep the floor dry. Clean up any water that spills on the floor as soon as it happens.  Remove soap buildup in the tub or shower regularly.  Attach bath mats securely with double-sided non-slip rug tape.  Do not have throw rugs and other things on the floor that can make you trip. What can I do in the bedroom?  Use night lights.  Make sure that you have a light by your bed that is easy to reach.  Do not use any sheets or blankets that are too big for your bed. They should not hang down onto the floor.  Have a firm chair that has side arms. You can use this for support while you get dressed.  Do not have throw rugs and other things on the floor that can make you trip. What can I do in the kitchen?  Clean up any spills right away.  Avoid walking on wet floors.  Keep items that you use a lot in easy-to-reach places.  If you need to reach something above you, use a strong step stool that has a grab bar.  Keep electrical cords out of the way.  Do not use floor polish or wax that makes floors  slippery. If you must use wax, use non-skid floor wax.  Do not have throw rugs and other things on the floor that can make you trip. What can I do with my stairs?  Do not leave any items on the stairs.  Make sure that there are handrails on both sides of the stairs and use them. Fix handrails that are broken or loose. Make sure that handrails are as long as the stairways.  Check any carpeting to make sure that it is firmly attached to the stairs. Fix any carpet that is loose or worn.  Avoid having throw rugs at the top or bottom of the stairs. If you do have throw rugs, attach them to the floor with carpet tape.  Make sure that you have a light switch at the top of the stairs and the bottom of the stairs. If you do not have them, ask someone to add them for you. What else can I do to help prevent falls?  Wear shoes that:  Do not have high heels.  Have rubber bottoms.  Are  comfortable and fit you well.  Are closed at the toe. Do not wear sandals.  If you use a stepladder:  Make sure that it is fully opened. Do not climb a closed stepladder.  Make sure that both sides of the stepladder are locked into place.  Ask someone to hold it for you, if possible.  Clearly mark and make sure that you can see:  Any grab bars or handrails.  First and last steps.  Where the edge of each step is.  Use tools that help you move around (mobility aids) if they are needed. These include:  Canes.  Walkers.  Scooters.  Crutches.  Turn on the lights when you go into a dark area. Replace any light bulbs as soon as they burn out.  Set up your furniture so you have a clear path. Avoid moving your furniture around.  If any of your floors are uneven, fix them.  If there are any pets around you, be aware of where they are.  Review your medicines with your doctor. Some medicines can make you feel dizzy. This can increase your chance of falling. Ask your doctor what other things that you can do to help prevent falls. This information is not intended to replace advice given to you by your health care provider. Make sure you discuss any questions you have with your health care provider. Document Released: 01/08/2009 Document Revised: 08/20/2015 Document Reviewed: 04/18/2014 Elsevier Interactive Patient Education  2017 [employer].

## 2019-11-14 NOTE — Progress Notes (Signed)
Virtual Visit via Telephone Note  I connected with  Alyssa Deleon on 11/14/19 at 11:00 AM EDT by telephone and verified that I am speaking with the correct person using two identifiers.  Medicare Annual Wellness visit completed telephonically due to Covid-19 pandemic.   Persons participating in this call: This Health Coach and this patient.   Location: Patient: Home Provider: Office    I discussed the limitations, risks, security and privacy concerns of performing an evaluation and management service by telephone and the availability of in person appointments. The patient expressed understanding and agreed to proceed.  Unable to perform video visit due to video visit attempted and failed and/or patient does not have video capability.   Some vital signs may be absent or patient reported.   [name], LPN    Subjective:   Alyssa Deleon is a 64 y.o. female who presents for an Initial Medicare Annual Wellness Visit.  Review of Systems     Cardiac Risk Factors include: advanced age (>88men, >26 women);sedentary lifestyle;dyslipidemia     Objective:    There were no vitals filed for this visit. There is no height or weight on file to calculate BMI.  Advanced Directives 11/14/2019  Does Patient Have a Medical Advance Directive? Yes  Type of Paramedic of [place];Living will  Copy of [place] in Chart? No - copy requested    Current Medications (verified) Outpatient Encounter Medications as of 11/14/2019  Medication Sig  . ALPRAZolam (XANAX) 1 MG tablet Take 3 tablets (3 mg total) by mouth at bedtime as needed for anxiety.  Marland Kitchen aspirin 500 MG tablet Take by mouth.  Marland Kitchen buPROPion (WELLBUTRIN XL) 300 MG 24 hr tablet Take 1 tablet (300 mg total) by mouth daily.  . calcium-vitamin D (OSCAL WITH D) 500-200 MG-UNIT tablet Take 1 tablet by mouth.  . DULoxetine (CYMBALTA) 60 MG capsule Take 1 capsule (60 mg total) by mouth daily.   Marland Kitchen ezetimibe (ZETIA) 10 MG tablet Take 1 tablet (10 mg total) by mouth daily.  Marland Kitchen gabapentin (NEURONTIN) 300 MG capsule Take 1 capsule (300 mg total) by mouth 2 (two) times daily.  Marland Kitchen oxyCODONE-acetaminophen (PERCOCET) 10-325 MG tablet Take 1 tablet by mouth every 8 (eight) hours as needed for pain.  Vladimir Faster Glycol-Propyl Glycol (SYSTANE) 0.4-0.3 % SOLN Apply to eye.  . simvastatin (ZOCOR) 40 MG tablet Take 1 tablet (40 mg total) by mouth daily.  . diclofenac Sodium (VOLTAREN) 1 % GEL Voltaren 1 % topical gel  APPLY 2 GRAM TO THE AFFECTED AREA(S) BY TOPICAL ROUTE 4 TIMES PER DAY (Patient not taking: Reported on 11/14/2019)  . UNKNOWN TO PATIENT Systane plus   No facility-administered encounter medications on file as of 11/14/2019.    Allergies (verified) Hydrocodone-acetaminophen, Klonopin [clonazepam], Oxycodone hcl, Crestor [rosuvastatin], Morphine sulfate, Effexor [venlafaxine], Sulfa antibiotics, and Vytorin [ezetimibe-simvastatin]   History: Past Medical History:  Diagnosis Date  . Breast cancer ([place])   . Cataract, left 09/29/2017  . Chronic pain syndrome, on Neurontin 09/30/2017  . Dry eye syndrome of both eyes, on Restasis 09/30/2017  . Family history of breast cancer   . Family history of melanoma   . Family history of ovarian cancer   . Family history of prostate cancer   . Family history of throat cancer   . Frequent urinary tract infections   . GAD (generalized anxiety disorder) 03/11/2015  . Genital herpes   . History of chicken pox   .  History of degenerative disc disease 10/27/2002  . 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 Major depression, recurrent, chronic ([place]), followed by Psych, on Wellbutrin, Cymbalta 09/30/2017  . Mixed hyperlipidemia, on Zetia and Zocor 09/30/2017  . 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 MVP (mitral valve prolapse) 03/13/2015  . 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 Nuclear sclerosis 06/10/2013  . Panic disorder, followed  by Psych, on Xanax 09/30/2017  . Personal history of breast cancer   . Psoriasis   . PTSD (post-traumatic stress disorder) 03/11/2015  . Stroke ([place])   . 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 Vitamin D deficiency 09/30/2017   Past Surgical History:  Procedure Laterality Date  . AUGMENTATION MAMMAPLASTY Bilateral   . BREAST BIOPSY    . MASTECTOMY Left 1995   has a tram flap  . RECONSTRUCTION BREAST W/ TRAM FLAP Left 2003   Family History  Problem Relation Age of Onset  . Cancer - Other Mother   . Miscarriages / [place] Mother   . Alzheimer's disease Mother   . Breast cancer Mother   . Skin cancer Mother   . Cancer - Prostate Father        metastatic  . Hyperlipidemia Father   . Throat cancer Father   . Cancer - Other Sister   . Depression Sister   . Hearing loss Sister   . Hyperlipidemia Sister   . Breast cancer Sister        both breasts  . Skin cancer Sister   . Cancer - Other Brother   . Throat cancer Brother   . Cancer - Other Sister   . Breast cancer Sister 19  . Breast cancer Maternal Grandmother   . Alzheimer's disease Maternal Grandfather   . Ovarian cancer Paternal Grandmother   . Heart attack Paternal Grandfather   . Breast cancer Paternal Aunt   . Prostate cancer Paternal Uncle   . Breast cancer Other        both breasts dx 64   Social History   Socioeconomic History  . Marital status: Divorced    Spouse name: Not on file  . Number of children: 2  . Years of education: Not on file  . Highest education level: Not on file  Occupational History  . Occupation: Retired/ disabilty  Tobacco Use  . Smoking status: Current Every Day Smoker    Packs/day: 0.50    Types: Cigarettes  . Smokeless tobacco: Never Used  Substance and Sexual Activity  . Alcohol use: No    Alcohol/week: 0.0 standard drinks  . Drug use: Never  . Sexual activity: Not Currently  Other Topics Concern  . Not on file  Social History Narrative  . Not on file   Social  Determinants of Health   Financial Resource Strain: Low Risk   . Difficulty of Paying Living Expenses: Not hard at all  Food Insecurity: No Food Insecurity  . Worried About Charity fundraiser in the Last Year: Never true  . Ran Out of Food in the Last Year: Never true  Transportation Needs: No Transportation Needs  . Lack of Transportation (Medical): No  . Lack of Transportation (Non-Medical): No  Physical Activity: Inactive  . Days of Exercise per Week: 0 days  . Minutes of Exercise per Session: 0 min  Stress: No Stress Concern Present  . Feeling of Stress : Not at all  Social Connections: Socially Isolated  . Frequency of Communication with Friends and Family:  Once a week  . Frequency of Social Gatherings with Friends and Family: Never  . Attends Religious Services: Never  . Active Member of Clubs or Organizations: No  . Attends Archivist Meetings: Never  . Marital Status: Divorced    Tobacco Counseling Ready to quit: Not Answered Counseling given: Not Answered   Clinical Intake:  Pre-visit preparation completed: Yes  Pain : 0-10 (in process of  pain managment care) Pain Type: Chronic pain Pain Location: Back (disk degenerative disease) Pain Descriptors / Indicators: Shooting Pain Onset: More than a month ago Pain Frequency: Intermittent (depending on duty at the time)     BMI - recorded: 29.73 Nutritional Status: BMI 25 -29 Overweight Nutritional Risks: None Diabetes: No  How often do you need to have someone help you when you read instructions, pamphlets, or other written materials from your doctor or pharmacy?: 1 - Never  Diabetic?No  Interpreter Needed?: No  Information entered by :: Charlott Rakes, LPN   Activities of Daily Living In your present state of health, do you have any difficulty performing the following activities: 11/14/2019 11/28/2018  Hearing? Y N  Comment mild loss in left ear -  Vision? N Y  Difficulty concentrating or  making decisions? Y N  Comment short term memeory loss at times -  Walking or climbing stairs? Y N  Comment don't take stairs related to balance issues and back pain -  Dressing or bathing? N N  Doing errands, shopping? N N  Preparing Food and eating ? N -  Some recent data might be hidden    Patient Care Team: Orma Flaming, MD as PCP - General (Family Medicine) Ambrose Finland, MD as Consulting Physician (Psychiatry) Delsa Sale, OD (Optometry) Lavonna Monarch, MD as Consulting Physician (Dermatology) Darleen Crocker, MD as Consulting Physician (Ophthalmology)  Indicate any recent Medical Services you may have received from other than [facility] providers in the past year (date may be approximate).     Assessment:   This is a routine wellness examination for Alyssa Deleon.  Hearing/Vision screen  Hearing Screening   125Hz  250Hz  500Hz  1000Hz  2000Hz  3000Hz  4000Hz  6000Hz  8000Hz   Right ear:           Left ear:           Comments: Pt stated had a busted ear drum in left ear and has mild loss  Vision Screening Comments: Pt follows up with Dr Teodoro Spray for annual eye exams  Dietary issues and exercise activities discussed: Current Exercise Habits: The patient does not participate in regular exercise at present, Exercise limited by: orthopedic condition(s)  Goals    . Patient Stated     Get back active with exercise after back pain subsides      Depression Screen PHQ 2/9 Scores 11/14/2019 11/28/2018  PHQ - 2 Score 0 0  PHQ- 9 Score - 0    Fall Risk Fall Risk  11/14/2019  Falls in the past year? 1  Number falls in past yr: 1  Injury with Fall? 1  Comment bruised left knee to top of hip  Risk for fall due to : Impaired balance/gait;History of fall(s);Impaired mobility;Impaired vision;Orthopedic patient  Follow up Falls prevention discussed    Any stairs in or around the home? Yes  If so, are there any without handrails? No  Home free of loose throw rugs in walkways,  pet beds, electrical cords, etc? Yes  Adequate lighting in your home to reduce risk of falls? Yes  ASSISTIVE DEVICES UTILIZED TO PREVENT FALLS:  Life alert? No  Use of a cane, walker or w/c? No  Grab bars in the bathroom? No  Shower chair or bench in shower? No Elevated toilet seat or a handicapped toilet? No   TIMED UP AND GO:  Was the test performed? No .   Cognitive Function:        Immunizations Immunization History  Administered Date(s) Administered  . Fluad Quad(high Dose 65+) 12/11/2018  . Influenza Inj Mdck Quad Pf 01/17/2017  . Influenza, Seasonal, Injecte, Preservative Fre 02/03/2015  . Influenza,inj,Quad PF,6+ Mos 01/15/2016, 01/10/2018  . PFIZER SARS-COV-2 Vaccination 05/18/2019, 06/11/2019  . Tdap 09/27/2017    TDAP status: Up to date Flu Vaccine status: Up to date Pneumococcal vaccine status: Declined,  Education has been provided regarding the importance of this vaccine but patient still declined. Advised may receive this vaccine at local pharmacy or Health Dept. Aware to provide a copy of the vaccination record if obtained from local pharmacy or Health Dept. Verbalized acceptance and understanding.  Covid-19 vaccine status: Completed vaccines  Qualifies for Shingles Vaccine? Yes   Zostavax completed No   Shingrix Completed?: No.    Education has been provided regarding the importance of this vaccine. Patient has been advised to call insurance company to determine out of pocket expense if they have not yet received this vaccine. Advised may also receive vaccine at local pharmacy or Health Dept. Verbalized acceptance and understanding.  Screening Tests Health Maintenance  Topic Date Due  . COLONOSCOPY  Never done  . DEXA SCAN  11/08/2018  . PNA vac Low Risk Adult (1 of 2 - PCV13) Never done  . INFLUENZA VACCINE  10/27/2019  . MAMMOGRAM  11/16/2019  . TETANUS/TDAP  09/28/2027  . COVID-19 Vaccine  Completed  . Hepatitis C Screening  Completed     Health Maintenance  Health Maintenance Due  Topic Date Due  . COLONOSCOPY  Never done  . DEXA SCAN  11/08/2018  . PNA vac Low Risk Adult (1 of 2 - PCV13) Never done  . INFLUENZA VACCINE  10/27/2019    Colon cancer screening Want to order cologuard Mammogram status: Ordered 11/14/19. Pt provided with contact info and advised to call to schedule appt.  Bone Density status: Ordered 11/14/19. Pt provided with contact info and advised to call to schedule appt.  Lung Cancer Screening: (Low Dose CT Chest recommended if Age 77-80 years, 30 pack-year currently smoking OR have quit w/in 15years.) does qualify.   Lung Cancer Screening Referral:   Additional Screening:  Hepatitis C Screening:  Completed 09/27/17  Vision Screening: Recommended annual ophthalmology exams for early detection of glaucoma and other disorders of the eye. Is the patient up to date with their annual eye exam?  Yes  Who is the provider or what is the name of the office in which the patient attends annual eye exams? Dr Sabra Heck    Dental Screening: Recommended annual dental exams for proper oral hygiene  Community Resource Referral / Chronic Care Management: CRR required this visit?  No   CCM required this visit?  No      Plan:     I have personally reviewed and noted the following in the patient's chart:   . Medical and social history . Use of alcohol, tobacco or illicit drugs  . Current medications and supplements . Functional ability and status . Nutritional status . Physical activity . Advanced directives . List of other physicians . Hospitalizations, surgeries,  and ER visits in previous 12 months . Vitals . Screenings to include cognitive, depression, and falls . Referrals and appointments  In addition, I have reviewed and discussed with patient certain preventive protocols, quality metrics, and best practice recommendations. A written personalized care plan for preventive services as well as  general preventive health recommendations were provided to patient.     [name], LPN   07/22/621   Nurse Notes: None

## 2019-11-19 ENCOUNTER — Other Ambulatory Visit: Payer: Self-pay | Admitting: Family Medicine

## 2019-11-19 DIAGNOSIS — Z1231 Encounter for screening mammogram for malignant neoplasm of breast: Secondary | ICD-10-CM

## 2019-11-19 DIAGNOSIS — E2839 Other primary ovarian failure: Secondary | ICD-10-CM

## 2019-12-11 ENCOUNTER — Encounter: Payer: Self-pay | Admitting: Family Medicine

## 2019-12-11 ENCOUNTER — Other Ambulatory Visit: Payer: Self-pay

## 2019-12-11 DIAGNOSIS — G8929 Other chronic pain: Secondary | ICD-10-CM

## 2019-12-16 ENCOUNTER — Other Ambulatory Visit: Payer: Self-pay

## 2019-12-16 ENCOUNTER — Encounter: Payer: Self-pay | Admitting: Family Medicine

## 2019-12-16 DIAGNOSIS — E782 Mixed hyperlipidemia: Secondary | ICD-10-CM

## 2019-12-16 MED ORDER — SIMVASTATIN 40 MG PO TABS: 40.0000 mg | ORAL_TABLET | Freq: Every day | ORAL | 3 refills | Status: DC

## 2019-12-16 MED ORDER — EZETIMIBE 10 MG PO TABS: 10.0000 mg | ORAL_TABLET | Freq: Every day | ORAL | 0 refills | Status: DC

## 2019-12-18 ENCOUNTER — Other Ambulatory Visit: Payer: Self-pay

## 2019-12-18 DIAGNOSIS — M5441 Lumbago with sciatica, right side: Secondary | ICD-10-CM

## 2019-12-31 ENCOUNTER — Telehealth: Payer: Self-pay

## 2019-12-31 NOTE — Telephone Encounter (Signed)
  LAST APPOINTMENT DATE: 11/14/2019   NEXT APPOINTMENT DATE:@Visit  date not found  MEDICATION: oxyCODONE-acetaminophen (PERCOCET) 10-325 MG tablet  PHARMACY: [facility] - Address: [address] [place], [place] [address]  Comments: [place] and did not [place] in any medicine, asked if she can get a short supply as she is going to be here until 21st

## 2020-01-01 ENCOUNTER — Telehealth: Payer: Self-pay

## 2020-01-01 ENCOUNTER — Encounter: Payer: Self-pay | Admitting: Family Medicine

## 2020-01-01 NOTE — Telephone Encounter (Signed)
Patient is following up regarding this. Patient is also requesting a call back when we send it. Thank you

## 2020-01-01 NOTE — Telephone Encounter (Signed)
I spoke with the pt to give message below. Pt expressed understanding.

## 2020-01-01 NOTE — Telephone Encounter (Signed)
Pt is requesting Percocet 10-325 mg tab LOV: 07/01/2019 No Future visits scheduled Last refill 07/01/2019  Please Advise.

## 2020-01-01 NOTE — Telephone Encounter (Signed)
I can not send to a different state I am not licensed in. Would have to send here in [place]>  Dr. Rogers Blocker

## 2020-01-01 NOTE — Telephone Encounter (Signed)
error 

## 2020-01-01 NOTE — Telephone Encounter (Signed)
I have spoken with the pt to give message below. Pt voiced understanding and says that she has gotten into Pain Management. Her appointment is next month.

## 2020-01-06 ENCOUNTER — Encounter: Payer: Self-pay | Admitting: Family Medicine

## 2020-01-06 MED ORDER — OXYCODONE-ACETAMINOPHEN 10-325 MG PO TABS
1.0000 | ORAL_TABLET | Freq: Three times a day (TID) | ORAL | 0 refills | Status: DC | PRN
Start: 2020-01-06 — End: 2020-10-29

## 2020-01-06 NOTE — Telephone Encounter (Signed)
Pt requesting Oxycodone 10 mg tab LOV: 07/01/2019 No future visits scheduled  Refill?

## 2020-02-16 ENCOUNTER — Other Ambulatory Visit: Payer: Self-pay | Admitting: Family Medicine

## 2020-02-16 DIAGNOSIS — E782 Mixed hyperlipidemia: Secondary | ICD-10-CM

## 2020-02-27 ENCOUNTER — Ambulatory Visit: Payer: Medicare Other

## 2020-02-27 ENCOUNTER — Other Ambulatory Visit: Payer: Medicare Other

## 2020-03-06 ENCOUNTER — Telehealth: Payer: Self-pay

## 2020-03-06 DIAGNOSIS — E782 Mixed hyperlipidemia: Secondary | ICD-10-CM

## 2020-03-06 MED ORDER — EZETIMIBE 10 MG PO TABS: 10.0000 mg | ORAL_TABLET | Freq: Every day | ORAL | 3 refills | Status: DC

## 2020-03-06 NOTE — Telephone Encounter (Signed)
.   LAST APPOINTMENT DATE: 07/01/2019   NEXT APPOINTMENT DATE:@Visit  date not found  MEDICATION:ezetimibe (ZETIA) 10 MG tablet  [place], [place] - [address], [address]  **Let patient know to contact pharmacy at the end of the day to make sure medication is ready. **  ** Please notify patient to allow 48-72 hours to process**  **Encourage patient to contact the pharmacy for refills or they can request refills through [facility]**  CLINICAL FILLS OUT ALL BELOW:   LAST REFILL:  QTY:  REFILL DATE:    OTHER COMMENTS:    Okay for refill?  Please advise

## 2020-03-26 ENCOUNTER — Telehealth (INDEPENDENT_AMBULATORY_CARE_PROVIDER_SITE_OTHER): Payer: Medicare Other | Admitting: Family Medicine

## 2020-03-26 ENCOUNTER — Other Ambulatory Visit: Payer: Self-pay

## 2020-03-26 ENCOUNTER — Encounter: Payer: Self-pay | Admitting: Family Medicine

## 2020-03-26 VITALS — Ht 68.5 in | Wt 198.4 lb

## 2020-03-26 DIAGNOSIS — U071 COVID-19: Secondary | ICD-10-CM | POA: Diagnosis not present

## 2020-03-26 LAB — POCT INFLUENZA A/B
Influenza A, POC: NEGATIVE
Influenza B, POC: NEGATIVE

## 2020-03-26 NOTE — Patient Instructions (Addendum)
So im sending in a drug called ivermectin for you to take twice a day with food for 5 days.  Sending to a drug store in [place] called [facility] please call in 45 minutes to see about getting this mailed to you.   [phone]   Other vitamins: take for 10 days  Vitamin d3: 5000IU/day Zinc: 100mg /day Vitamin C 1000mg  three times a day Quercetin: 500mg  twice a day Melatonin: 10mg /nightly Aspirin: 325mg  daily  Can also get cimetidine: 400mg  twice a day   Mouthwash three times a day Get outside and do some deep breathing.   Pulse ox: goal is 93-94%. Can get this off [facility].   Quarantine x 10 days from symptom onset.    Any shortness of breath/difficulty breathing, swelling in leg go to hospital.

## 2020-03-26 NOTE — Progress Notes (Signed)
Patient: Alyssa Deleon MRN: [id number] DOB: 17-Mar-1956 PCP: Orland Mustard, MD    I connected with [name] on 03/26/20 at 12:00pm by a video enabled telemedicine application and verified that I am speaking with the correct person using two identifiers.  Location patient: car Location provider: [facility], Office Persons participating in this virtual visit: Ameri Cahoon and dr. Artis Flock   I discussed the limitations of evaluation and management by telemedicine and the availability of in person appointments. The patient expressed understanding and agreed to proceed.    Subjective:  Chief Complaint  Patient presents with  . Cough  . Sore Throat  . Generalized Body Aches  . Covid Exposure    HPI: The patient is a 64 y.o. female who presents today for Covid Exposure and symptoms. She had a family gathering with 12 people who were all vaccinated on christmas Day. She started to have symptoms on Monday with severe headache, sneezing and sore throat and was called on Monday and told 4 people from gathering had tested positive for covid. She started to ache everywhere. No fever/chills, no breathing issues. She does have a cough at times, mainly in AM. She states it is dry in nature. She has been taking sudafed as needed. She just aches and hurts.    Vaccinated x3 with Radiographer, therapeutic.  05/18/19, 06/11/19, and 12/26/19  Review of Systems  Constitutional: Negative for chills, fatigue and fever.  HENT: Positive for congestion and sore throat.   Respiratory: Positive for cough. Negative for chest tightness and shortness of breath.   Neurological: Positive for headaches.    Allergies Patient is allergic to hydrocodone-acetaminophen, klonopin [clonazepam], oxycodone hcl, crestor [rosuvastatin], morphine sulfate, effexor [venlafaxine], sulfa antibiotics, and vytorin [ezetimibe-simvastatin].  Past Medical History Patient  has a past medical history of Breast cancer ([facility]), Cataract, left  (09/29/2017), Chronic pain syndrome, on Neurontin (09/30/2017), Dry eye syndrome of both eyes, on Restasis (09/30/2017), Family history of breast cancer, Family history of melanoma, Family history of ovarian cancer, Family history of prostate cancer, Family history of throat cancer, Frequent urinary tract infections, GAD (generalized anxiety disorder) (03/11/2015), Genital herpes, History of chicken pox, History of degenerative disc disease (10/27/2002), Knee osteonecrosis, left ([facility]) (09/29/2017), Major depression, recurrent, chronic ([facility]), followed by Psych, on Wellbutrin, Cymbalta (09/30/2017), Mixed hyperlipidemia, on Zetia and Zocor (09/30/2017), 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  Surgical History Patient  has a past surgical history that includes Breast biopsy; Reconstruction breast w/ tram flap (Left, 2003); Mastectomy (Left, 1995); and Augmentation mammaplasty (Bilateral).  Family History [name]'s family history includes Alzheimer's disease in her maternal grandfather and mother; Breast cancer in her maternal grandmother, mother, paternal aunt, sister, and another family member; Breast cancer (age of onset: 34) in her sister; Cancer - Other in her brother, mother, sister, and sister; Cancer - Prostate in her father; Depression in her sister; Hearing loss in her sister; Heart attack in her paternal grandfather; Hyperlipidemia in her father and sister; Miscarriages / Stillbirths in her mother; Ovarian cancer in her paternal grandmother; Prostate cancer in her paternal uncle; Skin cancer in her mother and sister; Throat cancer in her  brother and father.  Social History Patient  reports that she has been smoking cigarettes. She has  been smoking about 0.50 packs per day. She has never used smokeless tobacco. She reports that she does not drink alcohol and does not use drugs.    Objective: Vitals:   03/26/20 1149  Weight: 198 lb 6.6 oz (90 kg)  Height: 5' 8.5" (1.74 m)    Body mass index is 29.73 kg/m.  Physical Exam Vitals reviewed.  Constitutional:      General: She is not in acute distress.    Appearance: She is well-developed. She is not ill-appearing.  HENT:     Head: Normocephalic and atraumatic.  Pulmonary:     Effort: Pulmonary effort is normal.     Comments: No increased work of breathing, talking in full complete sentences  Neurological:     General: No focal deficit present.     Mental Status: She is alert and oriented to person, place, and time.        Assessment/plan: 1. COVID-19 -covid exposure and symptoms consistent with covid. Will treat for covid.  -starting her on treatment nutraceutical bundle including zinc sulfate, vit D, vit C, quercetin and melatonin 5-15+ days depending on symptoms. She has already started this.  -already on SNRI.  -iodine 1% nasal drop 1-2x/day -cimetidine 400mg  BID  -ivermectin X5 days. Discussed with family that this is still considered experimental; however, multiple RCT have been done that have shown significant benefit with it's use. They would like to proceed.  -ASA 325mg  (no contraindication) x5-15 days -discussed hold all NSAIDs, would just do tylenol since on ASA.  -gargle mouthwash TID -outside as much as possible.  -pulse ox >93-94% -prone sleeping if possible.   - Novel Coronavirus, NAA ([facility]) - POCT Influenza A/B    Return if symptoms worsen or fail to improve.   , MD [place] [facility]   03/26/2020

## 2020-03-28 LAB — SARS-COV-2, NAA 2 DAY TAT

## 2020-03-28 LAB — NOVEL CORONAVIRUS, NAA: SARS-CoV-2, NAA: DETECTED — AB

## 2020-03-29 ENCOUNTER — Encounter: Payer: Self-pay | Admitting: Family Medicine

## 2020-04-10 ENCOUNTER — Ambulatory Visit: Payer: Medicare Other

## 2020-05-15 ENCOUNTER — Ambulatory Visit: Payer: Medicare Other

## 2020-06-01 ENCOUNTER — Encounter: Payer: Self-pay | Admitting: Family Medicine

## 2020-06-01 DIAGNOSIS — Z1211 Encounter for screening for malignant neoplasm of colon: Secondary | ICD-10-CM

## 2020-06-03 ENCOUNTER — Other Ambulatory Visit: Payer: Medicare Other

## 2020-06-08 NOTE — Addendum Note (Signed)
Addended by: Orma Flaming on: 06/08/2020 12:49 PM   Modules accepted: Orders

## 2020-06-23 ENCOUNTER — Inpatient Hospital Stay: Admission: RE | Admit: 2020-06-23 | Payer: Medicare Other | Source: Ambulatory Visit

## 2020-06-23 LAB — COLOGUARD: Cologuard: NEGATIVE

## 2020-06-28 LAB — COLOGUARD: Cologuard: NEGATIVE

## 2020-06-29 ENCOUNTER — Encounter: Payer: Self-pay | Admitting: Family Medicine

## 2020-07-04 ENCOUNTER — Inpatient Hospital Stay: Admission: RE | Admit: 2020-07-04 | Payer: Medicare Other | Source: Ambulatory Visit

## 2020-10-07 ENCOUNTER — Encounter: Payer: Self-pay | Admitting: Family Medicine

## 2020-10-14 ENCOUNTER — Ambulatory Visit: Payer: Medicare Other | Admitting: Physician Assistant

## 2020-10-16 ENCOUNTER — Telehealth: Payer: Medicare Other | Admitting: Physician Assistant

## 2020-10-16 DIAGNOSIS — R110 Nausea: Secondary | ICD-10-CM

## 2020-10-16 MED ORDER — ONDANSETRON HCL 4 MG PO TABS: 4.0000 mg | ORAL_TABLET | Freq: Three times a day (TID) | ORAL | 0 refills | Status: DC | PRN

## 2020-10-16 NOTE — Progress Notes (Signed)

## 2020-10-21 ENCOUNTER — Other Ambulatory Visit: Payer: Self-pay

## 2020-10-21 ENCOUNTER — Ambulatory Visit: Payer: Medicare Other | Admitting: Physician Assistant

## 2020-10-21 ENCOUNTER — Telehealth: Payer: Self-pay

## 2020-10-21 ENCOUNTER — Encounter: Payer: Self-pay | Admitting: Physician Assistant

## 2020-10-21 ENCOUNTER — Ambulatory Visit (HOSPITAL_COMMUNITY)
Admission: RE | Admit: 2020-10-21 | Discharge: 2020-10-21 | Disposition: A | Discharge status: Home / Self Care | Payer: Medicare Other | Source: Ambulatory Visit | Attending: Physician Assistant | Admitting: Physician Assistant

## 2020-10-21 VITALS — BP 129/77 | HR 82 | Temp 98.2°F | Ht 68.5 in | Wt 194.4 lb

## 2020-10-21 DIAGNOSIS — R110 Nausea: Secondary | ICD-10-CM | POA: Diagnosis present

## 2020-10-21 DIAGNOSIS — R1010 Upper abdominal pain, unspecified: Secondary | ICD-10-CM

## 2020-10-21 LAB — COMPREHENSIVE METABOLIC PANEL
ALT: 11 U/L (ref 0–35)
AST: 17 U/L (ref 0–37)
Albumin: 4.3 g/dL (ref 3.5–5.2)
Alkaline Phosphatase: 68 U/L (ref 39–117)
BUN: 14 mg/dL (ref 6–23)
CO2: 29 mEq/L (ref 19–32)
Calcium: 9 mg/dL (ref 8.4–10.5)
Chloride: 101 mEq/L (ref 96–112)
Creatinine, Ser: 0.86 mg/dL (ref 0.40–1.20)
GFR: 70.13 mL/min (ref >60.00)
Glucose, Bld: 82 mg/dL (ref 70–99)
Potassium: 4.4 mEq/L (ref 3.5–5.1)
Sodium: 140 mEq/L (ref 135–145)
Total Bilirubin: 0.5 mg/dL (ref 0.2–1.2)
Total Protein: 6.9 g/dL (ref 6.0–8.3)

## 2020-10-21 LAB — CBC WITH DIFFERENTIAL/PLATELET
Basophils Absolute: 0 10*3/uL (ref 0.0–0.1)
Basophils Relative: 0.7 % (ref 0.0–3.0)
Eosinophils Absolute: 0.2 10*3/uL (ref 0.0–0.7)
Eosinophils Relative: 2.5 % (ref 0.0–5.0)
HCT: 38.1 % (ref 36.0–46.0)
Hemoglobin: 12.3 g/dL (ref 12.0–15.0)
Lymphocytes Relative: 27.9 % (ref 12.0–46.0)
Lymphs Abs: 1.8 10*3/uL (ref 0.7–4.0)
MCHC: 32.3 g/dL (ref 30.0–36.0)
MCV: 92.8 fl (ref 78.0–100.0)
Monocytes Absolute: 0.4 10*3/uL (ref 0.1–1.0)
Monocytes Relative: 6.3 % (ref 3.0–12.0)
Neutro Abs: 4.1 10*3/uL (ref 1.4–7.7)
Neutrophils Relative %: 62.6 % (ref 43.0–77.0)
Platelets: 206 10*3/uL (ref 150.0–400.0)
RBC: 4.11 Mil/uL (ref 3.87–5.11)
RDW: 13.7 % (ref 11.5–15.5)
WBC: 6.5 10*3/uL (ref 4.0–10.5)

## 2020-10-21 LAB — LIPASE: Lipase: 229 U/L — ABNORMAL HIGH (ref 11.0–59.0)

## 2020-10-21 MED ORDER — IOHEXOL 350 MG/ML SOLN
100.0000 mL | Freq: Once | INTRAVENOUS | Status: AC | PRN
  Administered 2020-10-21: 80 mL via INTRAVENOUS
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
## 2020-10-21 NOTE — Patient Instructions (Signed)
You will be called with results of your labs and CT. Should symptoms suddenly worsen, go straight to the ED.

## 2020-10-21 NOTE — Telephone Encounter (Signed)
Appt today at 3:30PM

## 2020-10-21 NOTE — Progress Notes (Signed)
Acute Office Visit  Subjective:    Patient ID: Alyssa Deleon, female    DOB: Oct 24, 1955, 65 y.o.   MRN: [id number]  Chief Complaint  Patient presents with   Abdominal Pain   Nausea    HPI Patient is in today for nausea. She had a video visit on 10/16/20 and was told it was viral GI illness. She was given Zofran 4 mg, which has helped in the mornings. She has not been eating well. Now says that abdomen felt "hard as a rock" two days ago. No hx of abdominal surgeries. She is a breast cancer survivor and had reconstruction of left breast with tram flap.  States her stomach feels "sore" today. Worse with movement. No treatments tried other than the Zofran. She didn't have anything to eat today. She has been drinking Powerade. No fever or chills. No diarrhea or constipation. No dysuria. No sick contacts.   Past Medical History:  Diagnosis Date   Breast cancer ([place])    Cataract, left 09/29/2017   Chronic pain syndrome, on Neurontin 09/30/2017   Dry eye syndrome of both eyes, on Restasis 09/30/2017   Family history of breast cancer    Family history of melanoma    Family history of ovarian cancer    Family history of prostate cancer    Family history of throat cancer    Frequent urinary tract infections    GAD (generalized anxiety disorder) 03/11/2015   Genital herpes    History of chicken pox    History of degenerative disc disease 10/27/2002   Knee osteonecrosis, left ([place]) 09/29/2017   Major depression, recurrent, chronic ([place]), followed by Psych, on Wellbutrin, Cymbalta 09/30/2017   Mixed hyperlipidemia, on Zetia and Zocor 09/30/2017   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   MVP (mitral valve prolapse) 03/13/2015   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   Nuclear sclerosis 06/10/2013   Panic disorder, followed by Psych, on Xanax 09/30/2017   Personal history of breast cancer    Psoriasis    PTSD (post-traumatic stress disorder) 03/11/2015   Stroke ([place])    Total knee  replacement status, 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   Vitamin D deficiency 09/30/2017    Past Surgical History:  Procedure Laterality Date   AUGMENTATION MAMMAPLASTY Bilateral    BREAST BIOPSY     MASTECTOMY Left 1995   has a tram flap   RECONSTRUCTION BREAST W/ TRAM FLAP Left 2003    Family History  Problem Relation Age of Onset   Cancer - Other Mother    Miscarriages / [place] Mother    Alzheimer's disease Mother    Breast cancer Mother    Skin cancer Mother    Cancer - Prostate Father        metastatic   Hyperlipidemia Father    Throat cancer Father    Cancer - Other Sister    Depression Sister    Hearing loss Sister    Hyperlipidemia Sister    Breast cancer Sister        both breasts   Skin cancer Sister    Cancer - Other Brother    Throat cancer Brother    Cancer - Other Sister    Breast cancer Sister 14   Breast cancer Maternal Grandmother    Alzheimer's disease Maternal Grandfather    Ovarian cancer Paternal Grandmother    Heart attack Paternal Grandfather    Breast cancer Paternal Aunt    Prostate cancer Paternal Uncle    Breast cancer Other  both breasts dx 31    Social History   Socioeconomic History   Marital status: Divorced    Spouse name: Not on file   Number of children: 2   Years of education: Not on file   Highest education level: Not on file  Occupational History   Occupation: Retired/ disabilty  Tobacco Use   Smoking status: Every Day    Packs/day: 0.50    Types: Cigarettes   Smokeless tobacco: Never  Substance and Sexual Activity   Alcohol use: No    Alcohol/week: 0.0 standard drinks   Drug use: Never   Sexual activity: Not Currently  Other Topics Concern   Not on file  Social History Narrative   Not on file   Social Determinants of Health   Financial Resource Strain: Low Risk    Difficulty of Paying Living Expenses: Not hard at all  Food Insecurity: No Food Insecurity   Worried About Charity fundraiser in the Last Year: Never true    [place] in the Last Year: Never true  Transportation Needs: No Transportation Needs   Lack of Transportation (Medical): No   Lack of Transportation (Non-Medical): No  Physical Activity: Inactive   Days of Exercise per Week: 0 days   Minutes of Exercise per Session: 0 min  Stress: No Stress Concern Present   Feeling of Stress : Not at all  Social Connections: Socially Isolated   Frequency of Communication with Friends and Family: Once a week   Frequency of Social Gatherings with Friends and Family: Never   Attends Religious Services: Never   Marine scientist or Organizations: No   Attends Music therapist: Never   Marital Status: Divorced  Human resources officer Violence: Not At Risk   Fear of Current or Ex-Partner: No   Emotionally Abused: No   Physically Abused: No   Sexually Abused: No    Outpatient Medications Prior to Visit  Medication Sig Dispense Refill   ALPRAZolam (XANAX) 1 MG tablet Take 3 tablets (3 mg total) by mouth at bedtime as needed for anxiety. 90 tablet 1   aspirin 500 MG tablet Take by mouth.     buPROPion (WELLBUTRIN XL) 300 MG 24 hr tablet Take 1 tablet (300 mg total) by mouth daily. 90 tablet 1   calcium-vitamin D (OSCAL WITH D) 500-200 MG-UNIT tablet Take 1 tablet by mouth.     diclofenac Sodium (VOLTAREN) 1 % GEL Voltaren 1 % topical gel  APPLY 2 GRAM TO THE AFFECTED AREA(S) BY TOPICAL ROUTE 4 TIMES PER DAY (Patient not taking: Reported on 11/14/2019)     DULoxetine (CYMBALTA) 60 MG capsule Take 1 capsule (60 mg total) by mouth daily. 90 capsule 1   ezetimibe (ZETIA) 10 MG tablet Take 1 tablet (10 mg total) by mouth daily. 90 tablet 3   gabapentin (NEURONTIN) 300 MG capsule Take 1 capsule (300 mg total) by mouth 2 (two) times daily. 180 capsule 3   ondansetron (ZOFRAN) 4 MG tablet Take 1 tablet (4 mg total) by mouth every 8 (eight) hours as needed for nausea or vomiting. 20 tablet 0   oxyCODONE-acetaminophen (PERCOCET) 10-325 MG tablet  Take 1 tablet by mouth every 8 (eight) hours as needed for pain. 30 tablet 0   Polyethyl Glycol-Propyl Glycol (SYSTANE) 0.4-0.3 % SOLN Apply to eye.     simvastatin (ZOCOR) 40 MG tablet Take 1 tablet (40 mg total) by mouth daily. 90 tablet 3   UNKNOWN TO PATIENT  Systane plus     No facility-administered medications prior to visit.    Allergies  Allergen Reactions   Hydrocodone-Acetaminophen Shortness Of Breath   Klonopin [Clonazepam] Swelling   Oxycodone Hcl    Crestor [Rosuvastatin] Other (See Comments)    Myalgias   Morphine Sulfate Itching   Effexor [Venlafaxine] Other (See Comments)    Weight gain   Sulfa Antibiotics Diarrhea and Other (See Comments)   Vytorin [Ezetimibe-Simvastatin] Other (See Comments)    Myalgias    Review of Systems REFER TO HPI FOR PERTINENT POSITIVES AND NEGATIVES     Objective:    Physical Exam Vitals and nursing note reviewed.  Constitutional:      Appearance: She is well-developed and normal weight.  HENT:     Mouth/Throat:     Mouth: Mucous membranes are moist.  Eyes:     Extraocular Movements: Extraocular movements intact.     Pupils: Pupils are equal, round, and reactive to light.  Cardiovascular:     Rate and Rhythm: Normal rate and regular rhythm.     Heart sounds: Normal heart sounds. No murmur heard. Pulmonary:     Effort: Pulmonary effort is normal. No respiratory distress.     Breath sounds: Normal breath sounds.  Abdominal:     General: Abdomen is flat. Bowel sounds are normal.     Palpations: There is no mass.     Tenderness: There is abdominal tenderness in the epigastric area and periumbilical area. There is no right CVA tenderness, left CVA tenderness or rebound. Negative signs include Murphy's sign, McBurney's sign and obturator sign.     Hernia: No hernia is present.  Skin:    General: Skin is warm.  Neurological:     General: No focal deficit present.     Mental Status: She is alert.  Psychiatric:        Mood and  Affect: Mood normal.    BP 129/77   Pulse 82   Temp 98.2 F (36.8 C)   Ht 5' 8.5" (1.74 m)   Wt 194 lb 6.1 oz (88.2 kg)   SpO2 98%   BMI 29.13 kg/m  Wt Readings from Last 3 Encounters:  10/21/20 194 lb 6.1 oz (88.2 kg)  03/26/20 198 lb 6.6 oz (90 kg)  07/01/19 198 lb 6.4 oz (90 kg)    Health Maintenance Due  Topic Date Due   COLONOSCOPY (Pts 45-58yr Insurance coverage will need to be confirmed)  Never done   Zoster Vaccines- Shingrix (1 of 2) Never done   DEXA SCAN  11/08/2018   PNA vac Low Risk Adult (1 of 2 - PCV13) Never done   MAMMOGRAM  11/16/2019   COVID-19 Vaccine (4 - Booster for [facility]) 04/26/2020    There are no preventive care reminders to display for this patient.   Lab Results  Component Value Date   TSH 0.42 07/01/2019   Lab Results  Component Value Date   WBC 7.1 07/01/2019   HGB 13.6 07/01/2019   HCT 41.4 07/01/2019   MCV 92.3 07/01/2019   PLT 246.0 07/01/2019   Lab Results  Component Value Date   NA 142 07/01/2019   K 5.3 (H) 07/01/2019   CO2 30 07/01/2019   GLUCOSE 100 (H) 07/01/2019   BUN 11 07/01/2019   CREATININE 1.04 07/01/2019   BILITOT 0.5 07/01/2019   ALKPHOS 75 07/01/2019   AST 16 07/01/2019   ALT 11 07/01/2019   PROT 7.0 07/01/2019   ALBUMIN 4.4 07/01/2019  CALCIUM 9.2 07/01/2019   GFR 53.08 (L) 07/01/2019   Lab Results  Component Value Date   CHOL 164 12/11/2018   Lab Results  Component Value Date   HDL 74.10 12/11/2018   Lab Results  Component Value Date   LDLCALC 76 12/11/2018   Lab Results  Component Value Date   TRIG 66.0 12/11/2018   Lab Results  Component Value Date   CHOLHDL 2 12/11/2018   Lab Results  Component Value Date   HGBA1C 5.7 09/27/2017       Assessment & Plan:   Problem List Items Addressed This Visit   None Visit Diagnoses     Pain of upper abdomen    -  Primary   Relevant Orders   CT Abdomen Pelvis W Contrast   CBC with Differential/Platelet   Comprehensive  metabolic panel   Lipase   Nausea       Relevant Orders   CT Abdomen Pelvis W Contrast   CBC with Differential/Platelet   Comprehensive metabolic panel   Lipase       1. Pain of upper abdomen 2. Nausea Nausea and abdominal pain in pleasant patient with hx of breast cancer. Acute "8/10" pain with palpation of upper abdomen in office. Will schedule for STAT CT abdomen/pelvis with contrast and also order STAT CBC, CMP, Lipase. Treat pending CT and lab results. She will go to the ED if symptoms suddenly worsen or change.   Daizee Firmin M Ronnell Makarewicz, PA-C

## 2020-10-23 ENCOUNTER — Encounter: Payer: Self-pay | Admitting: Physician Assistant

## 2020-10-26 ENCOUNTER — Encounter: Payer: Self-pay | Admitting: Physician Assistant

## 2020-10-27 ENCOUNTER — Encounter: Payer: Self-pay | Admitting: Physician Assistant

## 2020-10-27 ENCOUNTER — Ambulatory Visit: Payer: Medicare Other | Admitting: Dermatology

## 2020-10-28 NOTE — Telephone Encounter (Signed)
Sent pt a My Chart message to schedule an appt. See other My Chart message.

## 2020-10-29 ENCOUNTER — Ambulatory Visit: Payer: Medicare Other | Admitting: Family Medicine

## 2020-10-29 ENCOUNTER — Encounter: Payer: Self-pay | Admitting: Family Medicine

## 2020-10-29 ENCOUNTER — Other Ambulatory Visit: Payer: Self-pay

## 2020-10-29 VITALS — BP 101/68 | HR 99 | Temp 97.2°F | Ht 69.0 in | Wt 190.4 lb

## 2020-10-29 DIAGNOSIS — K859 Acute pancreatitis without necrosis or infection, unspecified: Secondary | ICD-10-CM | POA: Diagnosis not present

## 2020-10-29 DIAGNOSIS — R1013 Epigastric pain: Secondary | ICD-10-CM

## 2020-10-29 DIAGNOSIS — I700 Atherosclerosis of aorta: Secondary | ICD-10-CM

## 2020-10-29 DIAGNOSIS — J439 Emphysema, unspecified: Secondary | ICD-10-CM | POA: Insufficient documentation

## 2020-10-29 DIAGNOSIS — R110 Nausea: Secondary | ICD-10-CM | POA: Diagnosis not present

## 2020-10-29 DIAGNOSIS — E785 Hyperlipidemia, unspecified: Secondary | ICD-10-CM | POA: Diagnosis not present

## 2020-10-29 MED ORDER — PANTOPRAZOLE SODIUM 40 MG PO TBEC: 40.0000 mg | DELAYED_RELEASE_TABLET | Freq: Every day | ORAL | 1 refills | Status: DC

## 2020-10-29 MED ORDER — ONDANSETRON HCL 4 MG PO TABS: 4.0000 mg | ORAL_TABLET | Freq: Three times a day (TID) | ORAL | 0 refills | Status: DC | PRN

## 2020-10-29 NOTE — Patient Instructions (Addendum)
Please stop by lab before you go If you have mychart- we will send your results within 3 business days of [place] receiving them.  If you do not have mychart- we will call you about results within 5 business days of [place] receiving them.  *please also note that you will see labs on mychart as soon as they post. I will later go in and write notes on them- will say "notes from Dr. Yong Channel"  I appreciate your motivation to quit smoking completely! You can do this  Start Pantoprazole 40 mg once daily before breakfast for your reflux.  Refilled nausea medicine  Please hold off on Aspirin as this can cause ulcers- you may try taking Tylenol 325 mg 3 times a day so it can be easier on your stomach.  Recommended follow up: Return in about 1-2 weeks (around 8/11-18/2022) for follow-up or sooner if needed with Alyssa Deleon.

## 2020-10-29 NOTE — Progress Notes (Signed)
D  Phone [phone] In person visit   Subjective:   Alyssa Deleon is a 65 y.o. year old very pleasant female patient who presents for/with See problem oriented charting Chief Complaint  Patient presents with   GI Problem    Nausea , Hot flashes, Upset stomach, Indigestion, Fatigue, Patient wants to eat but the thought of eating makes her feel sick and cause stomach pain    This visit occurred during the SARS-CoV-2 public health emergency.  Safety protocols were in place, including screening questions prior to the visit, additional usage of staff PPE, and extensive cleaning of exam room while observing appropriate contact time as indicated for disinfecting solutions.   Past Medical History-  Patient Active Problem List   Diagnosis Date Noted   Aortic atherosclerosis ([place]) 10/29/2020    Priority: Medium   Emphysema lung ([place]) 10/29/2020    Priority: Medium   Genetic testing 03/27/2018   Personal history of breast cancer    Bilateral lower extremity edema 02/03/2018   Epidermoid cyst of skin 02/03/2018   B12 deficiency 02/03/2018   Mixed hyperlipidemia, on Zetia and Zocor 09/30/2017   Chronic pain syndrome, on Neurontin 09/30/2017   Vitamin D deficiency 09/30/2017   Major depression, recurrent, chronic ([place]), followed by Psych, on Wellbutrin, Cymbalta 09/30/2017   Panic disorder, followed by Psych, on Xanax 09/30/2017   Dry eye syndrome of both eyes, on Restasis 09/30/2017   History of stroke, on ASA 09/30/2017   Cataract, left 09/29/2017   Knee osteonecrosis, left ([place]) 09/29/2017   Chronic low back pain 05/24/2017   MVP (mitral valve prolapse) 03/13/2015   GAD (generalized anxiety disorder) 03/11/2015   PTSD (post-traumatic stress disorder) 03/11/2015   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 90 tablet 1   buPROPion (WELLBUTRIN XL) 300 MG 24 hr tablet Take 1 tablet (300 mg total) by mouth daily. 90 tablet 1   calcium-vitamin D (OSCAL WITH D) 500-200 MG-UNIT tablet Take 1 tablet by mouth.     DULoxetine (CYMBALTA) 60 MG capsule Take 1 capsule (60 mg total) by mouth daily. 90 capsule 1   ezetimibe (ZETIA) 10 MG tablet Take 1 tablet (10 mg total) by mouth daily. 90 tablet 3   gabapentin (NEURONTIN) 300 MG capsule Take 1 capsule (300 mg total) by mouth 2 (two) times daily. 180 capsule 3   pantoprazole (PROTONIX) 40 MG tablet Take 1 tablet (40 mg total) by mouth daily. 30 tablet 1   simvastatin (ZOCOR) 40 MG tablet Take 1 tablet (40 mg total) by mouth daily. 90 tablet 3   ondansetron (ZOFRAN) 4 MG tablet Take 1 tablet (4 mg total) by mouth every 8 (eight) hours as needed for nausea or vomiting. 20 tablet 0   No current facility-administered medications for this visit.     Objective:  BP 101/68   Pulse 99   Temp (!) 97.2 F (36.2 C) (Temporal)   Ht '5\' 9"'$  (1.753 m)   Wt 190 lb 6.4 oz (86.4 kg)   SpO2 96%   BMI 28.12 kg/m  Gen: NAD, resting comfortably CV: RRR no murmurs rubs or gallops Lungs: CTAB  no crackles, wheeze, rhonchi Abdomen: soft/moderate tenderness epigastric area with some pain into RUQ and LUQ/nondistended/normal bowel sounds. No rebound or guarding.  Ext: no edema Skin: warm, dry     Assessment and Plan   #Nausea/epigastric abdominal pain #Elevated lipase-possible pancreatitis S:Patient reports experiencing stomach issues for 3 weeks.  She complains of multiple symptoms including nausea, hot flashes, upset stomach, indigestion, upper abdominal pain, fatigue.  The thought of eating makes her feel sick and causes abdominal pain and gets shaky.   Patient  has lost 8 pounds this year including 5 lbs in last week or two.  Has had one visit with Theresa Duty, PA on 10/21/2020.  Pain at that time was reported as 8 out of 10 with palpation in the office.  Scheduled for stat CT as well as CBC, CMP, lipase -Results noted"IMPRESSION: 1. No acute abnormality in the abdomen/pelvis. 2. Moderate volume of colonic stool with colonic redundancy, suggesting constipation. No bowel obstruction or inflammation. Aortic Atherosclerosis (ICD10-I70.0)."  Patient was placed on a bland diet after lipase came back elevated though CT scan did not show obvious pancreatitis-on July 29 patient reported on MyChart "feeling great. No pain at this time!".  Symptoms worsened again on August 1 report-started with nausea again- zofran continues to help some  Patient was encouraged to follow-up in the office.  Today reports when she gets hungry she has noted she gets some headaches-has been hungry a lot due to nausea-she has taken a fair number of doses of aspirin 325 mg lately.  -planning to quit smoking- half a pack a day.  She seems extremely motivated based off of emphysema finding on CT.  I strongly encourage patient to quit smoking today A/P: 65 year old female with 3 weeks of nausea/epigastric abdominal pain/hot flashes with largely reassuring CT scan of abdomen pelvis but elevated lipase greater than 200-could have mild pancreatitis-continue bowel rest-encouraged fluid diet first And then softer foods or brat type diet.  She was very concerned at elevated lipase level and we will repeat again today along with amylase, CBC, CMP. Trigger for pancreatitis not clear though. Reports alcohol only once a month. No obvious gallbladder issues on imaging. Will check triglycerides though based on prior data points doubt that is the cause.   I am also concerned with patient reported aspirin intake as well as epigastric abdominal pain and ongoing nausea despite some bowel rest about  possibility of either GERD or gastric ulcer-opted to treat with pantoprazole up to 2 months.  -appears to be associated with very low risk of pancreatitis and in some models may reduce risk   Encouraged follow-up in 1 to 2 weeks with Alyssa to check on her progress.  Discussed if not making substantial progress considering GI referral  #hyperlipidemia #aortic atherosclerosis S: Medication:simvastatin 40 mg, zetia 10 mg daily  -Discussed management of aortic atherosclerosis and LDL goal under 70 Lab Results  Component Value Date   CHOL 164 12/11/2018   HDL 74.10 12/11/2018   LDLCALC 76 12/11/2018   TRIG 66.0 12/11/2018   CHOLHDL 2 12/11/2018   A/P: we discussed aortic atherosclerosis and LDL goal under 70- overdue for lipid panel- we will update with labs today- suspect improved and will continue current meds for now  Recommended follow up: Return in about 2 weeks (around 11/12/2020) for follow-up or sooner if needed with Alyssa. Future Appointments  Date Time Provider [place]  11/10/2020  2:00 PM [facility] DX DEXA 1 [facility] GI-BRE[facility]  11/19/2020 11:00 AM [facility]  HEALTH COACH [facility]  01/07/2021  1:00 PM [place], Randa Evens, PA-C [facility]  01/19/2021 10:45 AM Lavonna Monarch, MD [facility]    Lab/Order associations:   ICD-10-CM   1. Acute pancreatitis, unspecified complication status, unspecified pancreatitis type  K85.90     2. Hyperlipidemia, unspecified hyperlipidemia type  E78.5 Lipid panel    3. Nausea  R11.0 ondansetron (ZOFRAN) 4 MG tablet    4. Aortic atherosclerosis ([facility])  I70.0     5. Epigastric abdominal pain  R10.13 Lipase    Amylase    Comprehensive metabolic panel    6. Pulmonary emphysema, unspecified emphysema type ([place])  J43.9       Meds ordered this encounter  Medications   pantoprazole (PROTONIX) 40 MG tablet    Sig: Take 1 tablet (40 mg total) by mouth daily.    Dispense:  30 tablet    Refill:  1   ondansetron (ZOFRAN) 4 MG  tablet    Sig: Take 1 tablet (4 mg total) by mouth every 8 (eight) hours as needed for nausea or vomiting.    Dispense:  20 tablet    Refill:  0   I,Harris Phan,acting as a scribe for Garret Reddish, MD.,have documented all relevant documentation on the behalf of Garret Reddish, MD,as directed by  Garret Reddish, MD while in the presence of Garret Reddish, MD.  I, Garret Reddish, MD, have reviewed all documentation for this visit. The documentation on 10/29/20 for the exam, diagnosis, procedures, and orders are all accurate and complete.   Return precautions advised.  Garret Reddish, MD

## 2020-10-30 LAB — COMPREHENSIVE METABOLIC PANEL
ALT: 9 U/L (ref 0–35)
AST: 15 U/L (ref 0–37)
Albumin: 4.4 g/dL (ref 3.5–5.2)
Alkaline Phosphatase: 64 U/L (ref 39–117)
BUN: 22 mg/dL (ref 6–23)
CO2: 26 mEq/L (ref 19–32)
Calcium: 9 mg/dL (ref 8.4–10.5)
Chloride: 103 mEq/L (ref 96–112)
Creatinine, Ser: 0.96 mg/dL (ref 0.40–1.20)
GFR: 61.45 mL/min (ref >60.00)
Glucose, Bld: 86 mg/dL (ref 70–99)
Potassium: 4.2 mEq/L (ref 3.5–5.1)
Sodium: 139 mEq/L (ref 135–145)
Total Bilirubin: 0.6 mg/dL (ref 0.2–1.2)
Total Protein: 6.8 g/dL (ref 6.0–8.3)

## 2020-10-30 LAB — LIPID PANEL
Cholesterol: 172 mg/dL (ref 0–200)
HDL: 68.1 mg/dL (ref >39.00)
LDL Cholesterol: 90 mg/dL (ref 0–99)
NonHDL: 103.54
Total CHOL/HDL Ratio: 3
Triglycerides: 68 mg/dL (ref 0.0–149.0)
VLDL: 13.6 mg/dL (ref 0.0–40.0)

## 2020-10-30 LAB — LIPASE: Lipase: 267 U/L — ABNORMAL HIGH (ref 11.0–59.0)

## 2020-10-30 LAB — AMYLASE: Amylase: 104 U/L (ref 27–131)

## 2020-10-30 NOTE — Telephone Encounter (Signed)
Reviewed note from Dr. Yong Channel at yesterday's visit. Thankful to him for taking good care of patient.

## 2020-11-01 ENCOUNTER — Encounter: Payer: Self-pay | Admitting: Family Medicine

## 2020-11-02 ENCOUNTER — Telehealth: Payer: Self-pay | Admitting: Family Medicine

## 2020-11-02 NOTE — Telephone Encounter (Signed)
Copied from [place] [phone]. Topic: Medicare AWV >> Nov 02, 2020 11:41 AM Harris-Coley, Hannah Beat wrote: Reason for CRM: Rescheduled 11/19/20 appt. LVM 11/02/20 appt r/s to 9/1. Please confirm

## 2020-11-10 ENCOUNTER — Other Ambulatory Visit: Payer: Medicare Other

## 2020-11-11 ENCOUNTER — Ambulatory Visit: Payer: Medicare Other | Admitting: Physician Assistant

## 2020-11-11 ENCOUNTER — Other Ambulatory Visit: Payer: Self-pay

## 2020-11-11 ENCOUNTER — Encounter: Payer: Self-pay | Admitting: Physician Assistant

## 2020-11-11 VITALS — BP 108/73 | HR 79 | Temp 98.2°F | Ht 69.0 in | Wt 191.2 lb

## 2020-11-11 DIAGNOSIS — R1013 Epigastric pain: Secondary | ICD-10-CM | POA: Diagnosis not present

## 2020-11-11 DIAGNOSIS — J439 Emphysema, unspecified: Secondary | ICD-10-CM

## 2020-11-11 DIAGNOSIS — K859 Acute pancreatitis without necrosis or infection, unspecified: Secondary | ICD-10-CM

## 2020-11-11 LAB — LIPASE: Lipase: 29 U/L (ref 11.0–59.0)

## 2020-11-11 NOTE — Progress Notes (Signed)
Established Patient Office Visit  Subjective:  Patient ID: Alyssa Deleon, female    DOB: 03-23-1956  Age: 65 y.o. MRN: [id number]  CC:  Chief Complaint  Patient presents with   Abdominal Pain   Pancreatitis    HPI [name] presents for recheck from elevated lipase, possible pancreatitis, treated as outpatient since 10/27/20.   States she still "cannot eat." Potatoes and crackers are ok, otherwise most foods are upsetting her stomach. Nausea has resolved. Taking Protonix 40 mg, which seems to be helping as well. She has been drinking water and Powerade without any issues.   Quit smoking completely on 11/06/20.  Past Medical History:  Diagnosis Date   Breast cancer ([place])    Cataract, left 09/29/2017   Chronic pain syndrome, on Neurontin 09/30/2017   Dry eye syndrome of both eyes, on Restasis 09/30/2017   Family history of breast cancer    Family history of melanoma    Family history of ovarian cancer    Family history of prostate cancer    Family history of throat cancer    Frequent urinary tract infections    GAD (generalized anxiety disorder) 03/11/2015   Genital herpes    History of chicken pox    History of degenerative disc disease 10/27/2002   Knee osteonecrosis, left ([place]) 09/29/2017   Major depression, recurrent, chronic ([place]), followed by Psych, on Wellbutrin, Cymbalta 09/30/2017   Mixed hyperlipidemia, on Zetia and Zocor 09/30/2017   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   MVP (mitral valve prolapse) 03/13/2015   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   Nuclear sclerosis 06/10/2013   Panic disorder, followed by Psych, on Xanax 09/30/2017   Personal history of breast cancer    Psoriasis    PTSD (post-traumatic stress disorder) 03/11/2015   Stroke ([place])    Total knee replacement status, 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   Vitamin D deficiency 09/30/2017    Past Surgical History:  Procedure Laterality Date   AUGMENTATION MAMMAPLASTY Bilateral    BREAST BIOPSY      MASTECTOMY Left 1995   has a tram flap   RECONSTRUCTION BREAST W/ TRAM FLAP Left 2003    Family History  Problem Relation Age of Onset   Cancer - Other Mother    Miscarriages / [place] Mother    Alzheimer's disease Mother    Breast cancer Mother    Skin cancer Mother    Cancer - Prostate Father        metastatic   Hyperlipidemia Father    Throat cancer Father    Cancer - Other Sister    Depression Sister    Hearing loss Sister    Hyperlipidemia Sister    Breast cancer Sister        both breasts   Skin cancer Sister    Cancer - Other Brother    Throat cancer Brother    Cancer - Other Sister    Breast cancer Sister 66   Breast cancer Maternal Grandmother    Alzheimer's disease Maternal Grandfather    Ovarian cancer Paternal Grandmother    Heart attack Paternal Grandfather    Breast cancer Paternal Aunt    Prostate cancer Paternal Uncle    Breast cancer Other        both breasts dx 10    Social History   Socioeconomic History   Marital status: Divorced    Spouse name: Not on file   Number of children: 2   Years of education: Not on file  Highest education level: Not on file  Occupational History   Occupation: Retired/ disabilty  Tobacco Use   Smoking status: Every Day    Packs/day: 0.50    Types: Cigarettes   Smokeless tobacco: Never  Substance and Sexual Activity   Alcohol use: No    Alcohol/week: 0.0 standard drinks   Drug use: Never   Sexual activity: Not Currently  Other Topics Concern   Not on file  Social History Narrative   Not on file   Social Determinants of Health   Financial Resource Strain: Low Risk    Difficulty of Paying Living Expenses: Not hard at all  Food Insecurity: No Food Insecurity   Worried About Charity fundraiser in the Last Year: Never true   [place] in the Last Year: Never true  Transportation Needs: No Transportation Needs   Lack of Transportation (Medical): No   Lack of Transportation (Non-Medical): No   Physical Activity: Inactive   Days of Exercise per Week: 0 days   Minutes of Exercise per Session: 0 min  Stress: No Stress Concern Present   Feeling of Stress : Not at all  Social Connections: Socially Isolated   Frequency of Communication with Friends and Family: Once a week   Frequency of Social Gatherings with Friends and Family: Never   Attends Religious Services: Never   Marine scientist or Organizations: No   Attends Music therapist: Never   Marital Status: Divorced  Human resources officer Violence: Not At Risk   Fear of Current or Ex-Partner: No   Emotionally Abused: No   Physically Abused: No   Sexually Abused: No    Outpatient Medications Prior to Visit  Medication Sig Dispense Refill   ALPRAZolam (XANAX) 1 MG tablet Take 3 tablets (3 mg total) by mouth at bedtime as needed for anxiety. 90 tablet 1   buPROPion (WELLBUTRIN XL) 300 MG 24 hr tablet Take 1 tablet (300 mg total) by mouth daily. 90 tablet 1   calcium-vitamin D (OSCAL WITH D) 500-200 MG-UNIT tablet Take 1 tablet by mouth.     DULoxetine (CYMBALTA) 60 MG capsule Take 1 capsule (60 mg total) by mouth daily. 90 capsule 1   ezetimibe (ZETIA) 10 MG tablet Take 1 tablet (10 mg total) by mouth daily. 90 tablet 3   gabapentin (NEURONTIN) 300 MG capsule Take 1 capsule (300 mg total) by mouth 2 (two) times daily. 180 capsule 3   pantoprazole (PROTONIX) 40 MG tablet Take 1 tablet (40 mg total) by mouth daily. 30 tablet 1   simvastatin (ZOCOR) 40 MG tablet Take 1 tablet (40 mg total) by mouth daily. 90 tablet 3   ondansetron (ZOFRAN) 4 MG tablet Take 1 tablet (4 mg total) by mouth every 8 (eight) hours as needed for nausea or vomiting. 20 tablet 0   No facility-administered medications prior to visit.    Allergies  Allergen Reactions   Hydrocodone-Acetaminophen Shortness Of Breath   Klonopin [Clonazepam] Swelling   Crestor [Rosuvastatin] Other (See Comments)    Myalgias   Morphine Sulfate Itching    Effexor [Venlafaxine] Other (See Comments)    Weight gain   Sulfa Antibiotics Diarrhea and Other (See Comments)   Vytorin [Ezetimibe-Simvastatin] Other (See Comments)    Myalgias    ROS Review of Systems REFER TO HPI FOR PERTINENT POSITIVES AND NEGATIVES    Objective:    Physical Exam Vitals and nursing note reviewed.  Constitutional:      General: She  is not in acute distress.    Appearance: Normal appearance. She is normal weight.  HENT:     Head: Normocephalic.     Right Ear: External ear normal.     Left Ear: External ear normal.     Nose: Nose normal.     Mouth/Throat:     Mouth: Mucous membranes are moist.  Eyes:     Extraocular Movements: Extraocular movements intact.     Conjunctiva/sclera: Conjunctivae normal.     Pupils: Pupils are equal, round, and reactive to light.  Cardiovascular:     Rate and Rhythm: Normal rate and regular rhythm.     Pulses: Normal pulses.     Heart sounds: No murmur heard. Pulmonary:     Effort: Pulmonary effort is normal.     Breath sounds: Normal breath sounds.  Abdominal:     General: Abdomen is flat. Bowel sounds are normal.     Palpations: Abdomen is soft.     Tenderness: There is abdominal tenderness (very minimal, much improved from last visit) in the epigastric area.  Musculoskeletal:        General: Normal range of motion.     Cervical back: Normal range of motion.  Skin:    General: Skin is warm.  Neurological:     General: No focal deficit present.     Mental Status: She is alert and oriented to person, place, and time.     Gait: Gait normal.  Psychiatric:        Mood and Affect: Mood normal.        Behavior: Behavior normal.    BP 108/73   Pulse 79   Temp 98.2 F (36.8 C)   Ht '5\' 9"'$  (1.753 m)   Wt 191 lb 3.2 oz (86.7 kg)   SpO2 97%   BMI 28.24 kg/m  Wt Readings from Last 3 Encounters:  11/11/20 191 lb 3.2 oz (86.7 kg)  10/29/20 190 lb 6.4 oz (86.4 kg)  10/21/20 194 lb 6.1 oz (88.2 kg)     Health  Maintenance Due  Topic Date Due   COLONOSCOPY (Pts 45-49yr Insurance coverage will need to be confirmed)  Never done   Zoster Vaccines- Shingrix (1 of 2) Never done   DEXA SCAN  11/08/2018   PNA vac Low Risk Adult (1 of 2 - PCV13) Never done   MAMMOGRAM  11/16/2019   COVID-19 Vaccine (4 - [place]oster for [place] 04/26/2020   INFLUENZA VACCINE  10/26/2020    There are no preventive care reminders to display for this patient.  Lab Results  Component Value Date   TSH 0.42 07/01/2019   Lab Results  Component Value Date   WBC 6.5 10/21/2020   HGB 12.3 10/21/2020   HCT 38.1 10/21/2020   MCV 92.8 10/21/2020   PLT 206.0 10/21/2020   Lab Results  Component Value Date   NA 139 10/29/2020   K 4.2 10/29/2020   CO2 26 10/29/2020   GLUCOSE 86 10/29/2020   BUN 22 10/29/2020   CREATININE 0.96 10/29/2020   BILITOT 0.6 10/29/2020   ALKPHOS 64 10/29/2020   AST 15 10/29/2020   ALT 9 10/29/2020   PROT 6.8 10/29/2020   ALBUMIN 4.4 10/29/2020   CALCIUM 9.0 10/29/2020   GFR 61.45 10/29/2020   Lab Results  Component Value Date   CHOL 172 10/29/2020   Lab Results  Component Value Date   HDL 68.10 10/29/2020   Lab Results  Component Value Date   LDLCALC  90 10/29/2020   Lab Results  Component Value Date   TRIG 68.0 10/29/2020   Lab Results  Component Value Date   CHOLHDL 3 10/29/2020   Lab Results  Component Value Date   HGBA1C 5.7 09/27/2017      Assessment & Plan:   Problem List Items Addressed This Visit       Respiratory   Emphysema lung ([place])   Other Visit Diagnoses     Acute pancreatitis, unspecified complication status, unspecified pancreatitis type    -  Primary   Relevant Orders   Lipase   H Pylori, IGM, IGG, IGA AB   Epigastric abdominal pain       Relevant Orders   Lipase   H Pylori, IGM, IGG, IGA AB       No orders of the defined types were placed in this encounter.   Follow-up: No follow-ups on file.   1. Acute pancreatitis,  unspecified complication status, unspecified pancreatitis type -Somewhat improving. Still unable to eat most foods, but keeping liquids in well. - Lab Results  Component Value Date   LIPASE 267.0 (H) 10/29/2020   -Recheck lipase level. If still elevated, consider GI referral. -Weight has been stable now.   2. Epigastric abdominal pain -Will also check H. Pylori to r/o this illness which could have been contributing to her symptoms. -She has stopped taking all aspirin -She quit smoking as well, which will help tremendously. -Protonix 40 mg daily   3. Pulmonary emphysema, unspecified emphysema type ([place]) -Congratulated her on quitting smoking!  -Will monitor symptoms, but no problems or concerns today.  Total time spent with patient including H & P, chart and lab review, plan and documentation was 34 minutes today.   Wlliam Grosso M Fergie Sherbert, PA-C

## 2020-11-11 NOTE — Patient Instructions (Addendum)
Good to see you again. Labs today and I will send results to MyChart, discuss plan from there. Keep up good work pushing fluids. If lipase still elevated, will need to consider GI referral.

## 2020-11-14 LAB — H PYLORI, IGM, IGG, IGA AB
H pylori, IgM Abs: <9 units (ref 0.0–8.9)
H. pylori, IgA Abs: <9 units (ref 0.0–8.9)
H. pylori, IgG AbS: 0.32 Index Value (ref 0.00–0.79)

## 2020-11-16 ENCOUNTER — Encounter: Payer: Self-pay | Admitting: Physician Assistant

## 2020-11-19 ENCOUNTER — Ambulatory Visit: Payer: Medicare Other

## 2020-11-20 ENCOUNTER — Other Ambulatory Visit: Payer: Self-pay | Admitting: Family Medicine

## 2020-11-26 ENCOUNTER — Ambulatory Visit: Payer: Medicare Other

## 2020-12-14 ENCOUNTER — Other Ambulatory Visit: Payer: Self-pay

## 2020-12-14 ENCOUNTER — Encounter (HOSPITAL_BASED_OUTPATIENT_CLINIC_OR_DEPARTMENT_OTHER): Payer: Self-pay | Admitting: Emergency Medicine

## 2020-12-14 ENCOUNTER — Emergency Department (HOSPITAL_BASED_OUTPATIENT_CLINIC_OR_DEPARTMENT_OTHER): Payer: Medicare Other | Admitting: Radiology

## 2020-12-14 DIAGNOSIS — F17210 Nicotine dependence, cigarettes, uncomplicated: Secondary | ICD-10-CM | POA: Insufficient documentation

## 2020-12-14 DIAGNOSIS — S2231XA Fracture of one rib, right side, initial encounter for closed fracture: Secondary | ICD-10-CM | POA: Diagnosis not present

## 2020-12-14 DIAGNOSIS — R071 Chest pain on breathing: Secondary | ICD-10-CM | POA: Diagnosis not present

## 2020-12-14 DIAGNOSIS — W1839XA Other fall on same level, initial encounter: Secondary | ICD-10-CM | POA: Insufficient documentation

## 2020-12-14 DIAGNOSIS — Y9301 Activity, walking, marching and hiking: Secondary | ICD-10-CM | POA: Diagnosis not present

## 2020-12-14 DIAGNOSIS — Z853 Personal history of malignant neoplasm of breast: Secondary | ICD-10-CM | POA: Insufficient documentation

## 2020-12-14 DIAGNOSIS — S299XXA Unspecified injury of thorax, initial encounter: Secondary | ICD-10-CM | POA: Diagnosis present

## 2020-12-14 NOTE — ED Triage Notes (Signed)
Pt presents to ED POV. Pt c/o pain on R side and knee. Pt reports mechanical fall. No head injury, no LOC, no blood thinners.

## 2020-12-15 ENCOUNTER — Emergency Department (HOSPITAL_BASED_OUTPATIENT_CLINIC_OR_DEPARTMENT_OTHER)
Admission: EM | Admit: 2020-12-15 | Discharge: 2020-12-15 | Disposition: A | Discharge status: Home / Self Care | Payer: Medicare Other | Attending: Emergency Medicine | Admitting: Emergency Medicine

## 2020-12-15 DIAGNOSIS — S2231XA Fracture of one rib, right side, initial encounter for closed fracture: Secondary | ICD-10-CM

## 2020-12-15 HISTORY — DX: Emphysema, unspecified: J43.9

## 2020-12-15 MED ORDER — OXYCODONE-ACETAMINOPHEN 5-325 MG PO TABS: 1.0000 | ORAL_TABLET | Freq: Four times a day (QID) | ORAL | 0 refills | Status: DC | PRN

## 2020-12-15 MED ORDER — KETOROLAC TROMETHAMINE 15 MG/ML IJ SOLN
15.0000 mg | Freq: Once | INTRAMUSCULAR | Status: AC
  Administered 2020-12-15: 15 mg via INTRAMUSCULAR
  Filled 2020-12-15: qty 1

## 2020-12-15 MED ORDER — OXYCODONE-ACETAMINOPHEN 5-325 MG PO TABS
1.0000 | ORAL_TABLET | Freq: Once | ORAL | Status: AC
  Administered 2020-12-15: 1 via ORAL
  Filled 2020-12-15: qty 1

## 2020-12-15 NOTE — Discharge Instructions (Addendum)
You were seen today after a fall.  You sustained a rib fracture.  These can be very painful.  Make sure you are staying on top of your pain.  Take oxycodone as needed.  You may alternate Tylenol and Motrin additionally.  However, make sure that you do not exceed more than 4 g of Tylenol per day.  Use your incentive spirometer every 4 hours.

## 2020-12-15 NOTE — ED Provider Notes (Signed)
[facility] EMERGENCY DEPT Provider Note   CSN: [id number] Arrival date & time: 12/14/20  1952     History Chief Complaint  Patient presents with   Alyssa Deleon is a 65 y.o. female.  HPI     This is a 65 year old female with a history of breast cancer, chronic pain, stroke who presents following a fall.  Patient reports that she tripped and fell walking into her daughter's door.  She was trying to protect her left knee.  She fell onto her right side.  She is reporting 10 out of 10 pain in the right chest.  It is worse with deep breathing.  She has been ambulatory since.  No hip or knee pain.  Denies hitting her head or loss of consciousness.  She is not on any blood thinners.  Past Medical History:  Diagnosis Date   Breast cancer ([place])    Cataract, left 09/29/2017   Chronic pain syndrome, on Neurontin 09/30/2017   Dry eye syndrome of both eyes, on Restasis 09/30/2017   Emphysema lung ([place])    Family history of breast cancer    Family history of melanoma    Family history of ovarian cancer    Family history of prostate cancer    Family history of throat cancer    Frequent urinary tract infections    GAD (generalized anxiety disorder) 03/11/2015   Genital herpes    History of chicken pox    History of degenerative disc disease 10/27/2002   Knee osteonecrosis, 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   Major depression, recurrent, chronic ([place]), followed by Psych, on Wellbutrin, Cymbalta 09/30/2017   Mixed hyperlipidemia, on Zetia and Zocor 09/30/2017   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   MVP (mitral valve prolapse) 03/13/2015   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   Nuclear sclerosis 06/10/2013   Panic disorder, followed by Psych, on Xanax 09/30/2017   Personal history of breast cancer    Psoriasis    PTSD (post-traumatic stress disorder) 03/11/2015   Stroke ([place])    Total knee replacement status, 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   Vitamin D deficiency  09/30/2017    Patient Active Problem List   Diagnosis Date Noted   Aortic atherosclerosis ([place]) 10/29/2020   Emphysema lung ([place]) 10/29/2020   Genetic testing 03/27/2018   Personal history of breast cancer    Bilateral lower extremity edema 02/03/2018   Epidermoid cyst of skin 02/03/2018   B12 deficiency 02/03/2018   Mixed hyperlipidemia, on Zetia and Zocor 09/30/2017   Chronic pain syndrome, on Neurontin 09/30/2017   Vitamin D deficiency 09/30/2017   Major depression, recurrent, chronic ([place]), followed by Psych, on Wellbutrin, Cymbalta 09/30/2017   Panic disorder, followed by Psych, on Xanax 09/30/2017   Dry eye syndrome of both eyes, on Restasis 09/30/2017   History of stroke, on ASA 09/30/2017   Cataract, left 09/29/2017   Knee osteonecrosis, 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   Chronic low back pain 05/24/2017   MVP (mitral valve prolapse) 03/13/2015   GAD (generalized anxiety disorder) 03/11/2015   PTSD (post-traumatic stress disorder) 03/11/2015   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     Family History  Problem Relation Age of Onset   Cancer - Other Mother    Miscarriages / [place] Mother    Alzheimer's disease Mother    Breast cancer Mother    Skin cancer Mother    Cancer - Prostate Father        metastatic   Hyperlipidemia Father    Throat cancer Father    Cancer - Other Sister    Depression Sister    Hearing loss Sister    Hyperlipidemia Sister    Breast cancer Sister        both breasts   Skin cancer Sister    Cancer - Other Brother     Throat cancer Brother    Cancer - Other Sister    Breast cancer Sister 50   Breast cancer Maternal Grandmother    Alzheimer's disease Maternal Grandfather    Ovarian cancer Paternal Grandmother    Heart attack Paternal Grandfather    Breast cancer Paternal Aunt    Prostate cancer Paternal Uncle    Breast cancer Other        both breasts dx 66    Social History   Tobacco Use   Smoking status: Every Day    Packs/day: 0.50    Types: Cigarettes   Smokeless tobacco: Never  Substance Use Topics   Alcohol use: No    Alcohol/week: 0.0 standard drinks   Drug use: Never    Home Medications Prior to Admission medications   Medication Sig Start Date End Date Taking? Authorizing Provider  oxyCODONE-acetaminophen (PERCOCET/ROXICET) 5-325 MG tablet Take 1 tablet by mouth every 6 (six) hours as needed for severe pain. 12/15/20  Yes Miryah Ralls, Barbette Hair, MD  ALPRAZolam Duanne Moron) 1 MG tablet Take 3 tablets (3 mg total) by mouth at bedtime as needed for anxiety. 12/10/18   Briscoe Deutscher, DO  buPROPion (WELLBUTRIN XL) 300 MG 24 hr tablet Take 1 tablet (300 mg total) by mouth daily. 11/28/18   Briscoe Deutscher, DO  calcium-vitamin D (OSCAL WITH D) 500-200 MG-UNIT tablet Take 1 tablet by mouth.    [provider]  DULoxetine (CYMBALTA) 60 MG capsule Take 1 capsule (60 mg total) by mouth daily. 11/28/18   Briscoe Deutscher, DO  ezetimibe (ZETIA) 10 MG tablet Take 1 tablet (10 mg total) by mouth daily. 03/06/20   Orma Flaming, MD  gabapentin (NEURONTIN) 300 MG capsule Take 1 capsule (300 mg total) by mouth 2 (two) times daily. 11/28/18   Briscoe Deutscher, DO  pantoprazole (PROTONIX) 40 MG tablet TAKE 1 TABLET BY MOUTH EVERY DAY 11/20/20   Marin Olp, MD  simvastatin (ZOCOR) 40 MG tablet Take 1 tablet (40 mg total) by mouth daily. 12/16/19   Orma Flaming, MD    Allergies    Hydrocodone-acetaminophen, Klonopin [clonazepam], Crestor [rosuvastatin], Morphine sulfate, Effexor [venlafaxine], Sulfa  antibiotics, and Vytorin [ezetimibe-simvastatin]  Review of Systems   Review of Systems  Constitutional:  Negative for fever.  Respiratory:  Negative for shortness of breath.   Cardiovascular:  Positive for chest pain. Negative for leg swelling.  Gastrointestinal:  Negative for abdominal pain, nausea and vomiting.  Musculoskeletal:  Negative for neck pain.  Skin:  Negative for wound.  Neurological:  Negative for headaches.  All other systems reviewed and are negative.  Physical Exam Updated Vital Signs BP 117/74 (BP Location: Right Arm)   Pulse  66   Temp 98.3 F (36.8 C) (Oral)   Resp 18   Ht 1.727 m (5\' 8" )   Wt 81.6 kg   SpO2 100%   BMI 27.37 kg/m   Physical Exam Vitals and nursing note reviewed.  Constitutional:      Appearance: She is well-developed. She is not ill-appearing.  HENT:     Head: Normocephalic and atraumatic.     Nose: Nose normal.     Mouth/Throat:     Mouth: Mucous membranes are moist.  Eyes:     Pupils: Pupils are equal, round, and reactive to light.  Cardiovascular:     Rate and Rhythm: Normal rate and regular rhythm.     Heart sounds: Normal heart sounds.  Pulmonary:     Effort: Pulmonary effort is normal. No respiratory distress.     Breath sounds: No wheezing.     Comments: Tenderness to palpation right chest wall, no overlying skin changes, no crepitus Chest:     Chest wall: Tenderness present.  Abdominal:     Palpations: Abdomen is soft.  Musculoskeletal:     Cervical back: Neck supple.     Comments: Normal range of motion bilateral hips and knees, no obvious deformity  Skin:    General: Skin is warm and dry.  Neurological:     Mental Status: She is alert and oriented to person, place, and time.  Psychiatric:        Mood and Affect: Mood normal.    ED Results / Procedures / Treatments   Labs (all labs ordered are listed, but only abnormal results are displayed) Labs Reviewed - No data to display  EKG None  Radiology DG Ribs  Unilateral W/Chest Right  Result Date: 12/14/2020 CLINICAL DATA:  Right-sided chest pain following fall, initial encounter EXAM: RIGHT RIBS AND CHEST - 3+ VIEW COMPARISON:  04/18/2006 FINDINGS: Cardiac shadow is within normal limits. Lungs are clear. Right sixth rib fracture is noted with minimal displacement. No pneumothorax is seen. Right breast implant is noted. IMPRESSION: Right sixth rib fracture without complicating factors. Electronically Signed   By: Inez Catalina M.D.   On: 12/14/2020 21:44   DG Knee 2 Views Right  Result Date: 12/14/2020 CLINICAL DATA:  Fall and right knee pain. EXAM: RIGHT KNEE - 1-2 VIEW COMPARISON:  None. FINDINGS: There is no acute fracture or dislocation. Geographic areas of sclerotic changes involving the distal femur and proximal tibia most consistent with osteonecrosis and bone infarct. No significant arthritic changes. Mild osteopenia. No joint effusion. The soft tissues are unremarkable. IMPRESSION: 1. No acute fracture or dislocation. 2. Osteonecrosis of the distal femur and proximal tibia. Electronically Signed   By: Anner Crete M.D.   On: 12/14/2020 21:45    Procedures Procedures   Medications Ordered in ED Medications  ketorolac (TORADOL) 15 MG/ML injection 15 mg (15 mg Intramuscular Given 12/15/20 0042)  oxyCODONE-acetaminophen (PERCOCET/ROXICET) 5-325 MG per tablet 1 tablet (1 tablet Oral Given 12/15/20 0041)    ED Course  I have reviewed the triage vital signs and the nursing notes.  Pertinent labs & imaging results that were available during my care of the patient were reviewed by me and considered in my medical decision making (see chart for details).    MDM Rules/Calculators/A&P                           Patient presents with pain in the right chest wall after a fall.  She is nontoxic and vital signs are reassuring.  She is not hypoxic.  She is in no respiratory distress.  She does have tenderness over the right side of the chest.   Considerations include contusion, rib fracture.  Chest x-ray obtained.  No evidence of pneumothorax or pneumonia.  There is 1/6 rib fracture.  X-rays also obtained of the right knee which are negative.  Discussed with patient that she will need aggressive pain control and pulmonary toilet.  She states she has tolerated oxycodone in the past.  She was given oxycodone and a small dose of Toradol.  Last renal function was normal.  We discussed self-limited course of anti-inflammatories in addition to oxycodone for pain.  She was given an incentive spirometer.  She remained hemodynamically stable.  Given strict return precautions.  After history, exam, and medical workup I feel the patient has been appropriately medically screened and is safe for discharge home. Pertinent diagnoses were discussed with the patient. Patient was given return precautions.   Final Clinical Impression(s) / ED Diagnoses Final diagnoses:  Closed fracture of one rib of right side, initial encounter    Rx / DC Orders ED Discharge Orders          Ordered    oxyCODONE-acetaminophen (PERCOCET/ROXICET) 5-325 MG tablet  Every 6 hours PRN        12/15/20 0135             Merryl Hacker, MD 12/15/20 0150

## 2020-12-15 NOTE — ED Notes (Signed)
Pt from home after a fall coming into the house today. Pt denies loc or hitting her head. Pt reports it as a mechanical fall. Pain is in her right ribcage and right leg. She states that movement and coughing make the pain much worse (10/10). Pt alert & oriented, nad noted.

## 2020-12-17 ENCOUNTER — Telehealth: Payer: Self-pay

## 2020-12-17 NOTE — Telephone Encounter (Signed)
Can we do virtual appointment tomorrow with patient?  Nurse Assessment Nurse: Raphael Gibney, RN, Vanita Ingles Date/Time (Eastern Time): 12/17/2020 10:10:40 AM Confirm and document reason for call. If symptomatic, describe symptoms. ---Caller states she has had severe nausea the past 2 months. CT scan showed emphysema and spot on her left kidney. Went to the ER on Sunday after a fall. had x rays of her knee and has 6 fractured ribs on the right side. She was prescribed oxycodone 5 mg every 6 hrs and she is taking 2 tabs and it helps for about 5 hrs. Does the patient have any new or worsening symptoms? ---Yes Will a triage be completed? ---Yes Related visit to physician within the last 2 weeks? ---Yes Does the PT have any chronic conditions? (i.e. diabetes, asthma, this includes High risk factors for pregnancy, etc.) ---Yes List chronic conditions. ---emphysema; history of breast cancer; spot on left kidney; brain tumor; CVA Is this a behavioral health or substance abuse call? ---No Guidelines Guideline Title Affirmed Question Affirmed Notes Nurse Date/Time (Eastern Time) Chest Injury [1] High-risk adult (e.g., age > 52 years, osteoporosis, chronic Raphael Gibney, RN, Vanita Ingles 12/17/2020 10:17:58 AM PLEASE NOTE: All timestamps contained within this report are represented as [place] Standard Time. CONFIDENTIALTY NOTICE: This fax transmission is intended only for the addressee. It contains information that is legally privileged, confidential or otherwise protected from use or disclosure. If you are not the intended recipient, you are strictly prohibited from reviewing, disclosing, copying using or disseminating any of this information or taking any action in reliance on or regarding this information. If you have received this fax in error, please notify us immediately by telephone so that we can arrange for its return to [place]. Phone: [phone], Toll-Free: [phone], Fax: [phone] Page: 2 of 2 Call  Id: [id number] Guidelines Guideline Title Affirmed Question Affirmed Notes Nurse Date/Time Eilene Ghazi Time) steroid use) AND [2] still hurts Disp. Time Eilene Ghazi Time) Disposition Final User 12/17/2020 10:26:55 AM See PCP within 24 Hours Yes Raphael Gibney, RN, Doreatha Lew Disagree/Comply Disagree Caller Understands Yes PreDisposition Home Care Care Advice Given Per Guideline SEE PCP WITHIN 24 HOURS: * IF OFFICE WILL BE OPEN: You need to be examined within the next 24 hours. Call your doctor (or NP/PA) when the office opens and make an appointment. CALL BACK IF: * You become worse CARE ADVICE given per Chest Injury (Adult) guideline. Comments User: Dannielle Burn, RN Date/Time Eilene Ghazi Time): 12/17/2020 10:26:27 AM pt states she does not have transportation to come to the office. She was prescribed oxycodone 5 mg but states she is taking 2 tabs as 1 tab does not help the pain. She is almost out of medication. Please call pt back Referrals GO TO FACILITY REFUSED

## 2020-12-18 ENCOUNTER — Encounter: Payer: Self-pay | Admitting: Physician Assistant

## 2020-12-18 ENCOUNTER — Telehealth (INDEPENDENT_AMBULATORY_CARE_PROVIDER_SITE_OTHER): Payer: Medicare Other | Admitting: Physician Assistant

## 2020-12-18 DIAGNOSIS — S2231XA Fracture of one rib, right side, initial encounter for closed fracture: Secondary | ICD-10-CM

## 2020-12-18 DIAGNOSIS — Z79899 Other long term (current) drug therapy: Secondary | ICD-10-CM

## 2020-12-18 DIAGNOSIS — F410 Panic disorder [episodic paroxysmal anxiety] without agoraphobia: Secondary | ICD-10-CM | POA: Diagnosis not present

## 2020-12-18 MED ORDER — OXYCODONE-ACETAMINOPHEN 10-325 MG PO TABS: 1.0000 | ORAL_TABLET | Freq: Three times a day (TID) | ORAL | 0 refills | Status: AC | PRN

## 2020-12-18 NOTE — Patient Instructions (Signed)
Do not take the oxycodone and Xanax in combination. Pain management should be calling you to set an appointment. Do not drive on these medications. Continue to use the incentive spirometer every hour.

## 2020-12-18 NOTE — Progress Notes (Signed)
Virtual Visit via Video Note  I connected with  Alyssa Deleon  on 12/18/20 at  3:30 PM EDT by a video enabled telemedicine application and verified that I am speaking with the correct person using two identifiers.  Location: Patient: home Provider: Therapist, music at [place] present: Patient and myself   I discussed the limitations of evaluation and management by telemedicine and the availability of in person appointments. The patient expressed understanding and agreed to proceed.   History of Present Illness: Pleasant 65 year old female patient presents for virtual video visit with me today.  ED visit on 12/15/20 s/p trip and fall. Right 6th rib fracture shown on imaging report with minimal displacement. 6/10 pain with sitting. 10/10 pain with movement.  XR of right knee was negative for acute fracture or dislocation; evidence of osteonecrosis, which patient states is not new and happened after her knee replacement. She was given an incentive spirometer and states she has been doing it every hour, but it hurts badly.   She has taken two tablets daily of Oxycodone-Acetaminophen 5-325 mg since her discharge on 12/15/20, but states this is not helping her pain. She was previously given 10-325 mg for pain in years past and says this works better.   She takes Xanax 1 mg two tablets at night; then an additional tablet as needed for panic attacks, which is rarer per patient. She has been on Xanax since 1999 and this is filled by her psychiatrist.    Observations/Objective:  A & O x 3, no signs of distress, except she does yell out in pain when she moves.   Assessment and Plan:  1. Closed fracture of one rib of right side, initial encounter 2. High risk medication use 3. Panic disorder, followed by Psych, on Xanax Discussion with patient today about management of her pain secondary to her fractured rib.  I personally reviewed her emergency department note and  imaging reports and reviewed these with her today.  She understands the risks of decreased respiratory drive and the combination of oxycodone with Xanax.  In order to try to help alleviate her pain, I agreed to trial a 5-day course of of oxycodone acetaminophen 10-325 mg in place of the 5-325 mg.  She cannot take this medication with her Xanax.  She is also interested in weaning off the Xanax, but also understands the concern about possible seizures.  Recommended that she try taking only 1-1/2 of her Xanax tablets at bedtime, down from her 2 tablets.  She needs to take the oxycodone in the daytime and she cannot drive with this medication.  I have also sent an urgent referral to pain management to help in this situation, as she has such high risk, and this fracture is going to take at least 6 to 8 weeks to heal.  Patient agreeable and understanding and is going to call me with an update next week.   Follow Up Instructions:  Video connection was lost at >50% of the duration of the visit, at which time the remainder of the visit was completed via audio only.  23 min 16 sec spent on the phone with patient today.   I discussed the assessment and treatment plan with the patient. The patient was provided an opportunity to ask questions and all were answered. The patient agreed with the plan and demonstrated an understanding of the instructions.   The patient was advised to call back or seek an in-person evaluation if the  symptoms worsen or if the condition fails to improve as anticipated.  Mega Kinkade M Chane Cowden, PA-C

## 2020-12-24 ENCOUNTER — Encounter: Payer: Self-pay | Admitting: Physician Assistant

## 2020-12-29 ENCOUNTER — Other Ambulatory Visit: Payer: Self-pay

## 2020-12-29 ENCOUNTER — Ambulatory Visit: Payer: Medicare Other | Admitting: Physician Assistant

## 2020-12-29 ENCOUNTER — Other Ambulatory Visit: Payer: Self-pay | Admitting: Family Medicine

## 2020-12-29 ENCOUNTER — Encounter: Payer: Self-pay | Admitting: Physician Assistant

## 2020-12-29 VITALS — BP 115/75 | HR 102 | Temp 98.0°F | Ht 68.0 in | Wt 184.5 lb

## 2020-12-29 DIAGNOSIS — F410 Panic disorder [episodic paroxysmal anxiety] without agoraphobia: Secondary | ICD-10-CM

## 2020-12-29 DIAGNOSIS — S2231XA Fracture of one rib, right side, initial encounter for closed fracture: Secondary | ICD-10-CM | POA: Diagnosis not present

## 2020-12-29 DIAGNOSIS — H6122 Impacted cerumen, left ear: Secondary | ICD-10-CM | POA: Diagnosis not present

## 2020-12-29 DIAGNOSIS — Z79899 Other long term (current) drug therapy: Secondary | ICD-10-CM

## 2020-12-29 DIAGNOSIS — E782 Mixed hyperlipidemia: Secondary | ICD-10-CM

## 2020-12-29 DIAGNOSIS — I700 Atherosclerosis of aorta: Secondary | ICD-10-CM

## 2020-12-29 NOTE — Patient Instructions (Addendum)
**  Cancel TOC appointment Oct 13, as she is already my patient**   Your left ear was cleaned today.   Our referral specialist is trying to call to get you an appointment with Dr. Nelva Bush for pain management of your fractured rib.  You need to continue on your current Zetia and Zocor for your cholesterol and refer to handout for further lifestyle changes to help lower cholesterol.  I am happy to see you back near the end of November to discuss weaning off the Xanax.

## 2020-12-29 NOTE — Progress Notes (Signed)
Subjective:    Patient ID: Alyssa Deleon, female    DOB: Jan 10, 1956, 65 y.o.   MRN: [id number]  Chief Complaint  Patient presents with   Rib Injury    HPI Patient is in today for several concerns:  She is still having pain from her rib fracture. Note on referral to pain medicine reads:  "Spoke with the patient and she is currently seeing Dr. Nelva Bush for pain management and would like to continue going to him so she has agreed to call and schedule a f/u with him-closing"    Patient states that she does not remember saying that she would follow up with Dr. Nelva Bush.    She also would like to have her left ear checked-  thinks there is maybe a wax build-up or fluid.    She's also concerned about the aortic atherosclerosis and what to do about it. She said that her cholesterol goal of <70 LDL stated by Dr. Yong Channel at a previous visit is concerning and she wants to know how to get there.    She quit smoking August 15 and feels very proud of this. She also wants me to help her wean off of Xanax, which she has been taking for the last 20+ years.    Past Medical History:  Diagnosis Date   Breast cancer ([place])    Cataract, left 09/29/2017   Chronic pain syndrome, on Neurontin 09/30/2017   Dry eye syndrome of both eyes, on Restasis 09/30/2017   Emphysema lung ([place])    Family history of breast cancer    Family history of melanoma    Family history of ovarian cancer    Family history of prostate cancer    Family history of throat cancer    Frequent urinary tract infections    GAD (generalized anxiety disorder) 03/11/2015   Genital herpes    History of chicken pox    History of degenerative disc disease 10/27/2002   Knee osteonecrosis, 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   Major depression, recurrent, chronic ([place]), followed by Psych, on Wellbutrin, Cymbalta 09/30/2017   Mixed hyperlipidemia, on Zetia and Zocor 09/30/2017   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   MVP  (mitral valve prolapse) 03/13/2015   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   Nuclear sclerosis 06/10/2013   Panic disorder, followed by Psych, on Xanax 09/30/2017   Personal history of breast cancer    Psoriasis    PTSD (post-traumatic stress disorder) 03/11/2015   Stroke ([place])    Total knee replacement status, 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   Vitamin D deficiency 09/30/2017    Past Surgical History:  Procedure Laterality Date   AUGMENTATION MAMMAPLASTY Bilateral    BREAST BIOPSY     MASTECTOMY Left 1995   has a tram flap   RECONSTRUCTION BREAST W/ TRAM FLAP Left 2003    Family History  Problem Relation Age of Onset   Cancer - Other Mother    Miscarriages / [place] Mother    Alzheimer's disease Mother    Breast cancer Mother    Skin cancer Mother    Cancer - Prostate Father        metastatic   Hyperlipidemia Father    Throat cancer Father    Cancer - Other Sister    Depression Sister    Hearing loss Sister    Hyperlipidemia Sister    Breast cancer Sister        both breasts   Skin cancer Sister    Cancer - Other Brother  Throat cancer Brother    Cancer - Other Sister    Breast cancer Sister 40   Breast cancer Maternal Grandmother    Alzheimer's disease Maternal Grandfather    Ovarian cancer Paternal Grandmother    Heart attack Paternal Grandfather    Breast cancer Paternal Aunt    Prostate cancer Paternal Uncle    Breast cancer Other        both breasts dx 42    Social History   Tobacco Use   Smoking status: Every Day    Packs/day: 0.50    Types: Cigarettes   Smokeless tobacco: Never  Substance Use Topics   Alcohol use: No    Alcohol/week: 0.0 standard drinks   Drug use: Never     Allergies  Allergen Reactions   Hydrocodone-Acetaminophen Shortness Of Breath   Klonopin [Clonazepam] Swelling   Crestor [Rosuvastatin] Other (See Comments)    Myalgias   Morphine Sulfate Itching   Effexor [Venlafaxine] Other (See Comments)    Weight gain   Sulfa Antibiotics  Diarrhea and Other (See Comments)   Vytorin [Ezetimibe-Simvastatin] Other (See Comments)    Myalgias    Review of Systems REFER TO HPI FOR PERTINENT POSITIVES AND NEGATIVES      Objective:     BP 115/75   Pulse (!) 102   Temp 98 F (36.7 C)   Ht 5\' 8"  (1.727 m)   Wt 184 lb 8 oz (83.7 kg)   SpO2 96%   BMI 28.05 kg/m   Wt Readings from Last 3 Encounters:  12/29/20 184 lb 8 oz (83.7 kg)  12/14/20 180 lb (81.6 kg)  11/11/20 191 lb 3.2 oz (86.7 kg)    BP Readings from Last 3 Encounters:  12/29/20 115/75  12/15/20 113/71  11/11/20 108/73     Physical Exam Vitals and nursing note reviewed.  Constitutional:      Appearance: Normal appearance. She is normal weight. She is not toxic-appearing.     Comments: Expresses discomfort with certain movements.  HENT:     Head: Normocephalic and atraumatic.     Right Ear: Tympanic membrane, ear canal and external ear normal.     Left Ear: External ear normal. There is impacted cerumen.     Nose: Nose normal.     Mouth/Throat:     Mouth: Mucous membranes are moist.  Eyes:     Extraocular Movements: Extraocular movements intact.     Conjunctiva/sclera: Conjunctivae normal.     Pupils: Pupils are equal, round, and reactive to light.  Cardiovascular:     Rate and Rhythm: Normal rate and regular rhythm.     Pulses: Normal pulses.     Heart sounds: Normal heart sounds.  Pulmonary:     Effort: Pulmonary effort is normal.     Breath sounds: Normal breath sounds.  Musculoskeletal:        General: Normal range of motion.     Cervical back: Normal range of motion and neck supple.  Skin:    General: Skin is warm and dry.  Neurological:     General: No focal deficit present.     Mental Status: She is alert and oriented to person, place, and time.  Psychiatric:        Mood and Affect: Mood normal.        Behavior: Behavior normal.        Thought Content: Thought content normal.        Judgment: Judgment normal.  Assessment & Plan:   Problem List Items Addressed This Visit       Cardiovascular and Mediastinum   Aortic atherosclerosis ([place])     Other   Mixed hyperlipidemia, on Zetia and Zocor   Panic disorder, followed by Psych, on Xanax   Other Visit Diagnoses     Closed fracture of one rib of right side, initial encounter    -  Primary   High risk medication use       Left ear impacted cerumen          1. Closed fracture of one rib of right side, initial encounter 2. High risk medication use -I personally contacted our referral specialist, who is going to try to help the patient get an appointment with Dr. Nelva Bush for pain management of her fractured rib.   3. Panic disorder, followed by Psych, on Xanax -I have agreed to try to help wean the patient off Xanax and we can discuss this at her f/up in November.   4. Aortic atherosclerosis ([place]) 5. Mixed hyperlipidemia, on Zetia and Zocor -As stated on her CT abd/pelvis -Cholesterol readings are actually very good.  Lab Results  Component Value Date   CHOL 172 10/29/2020   HDL 68.10 10/29/2020   LDLCALC 90 10/29/2020   TRIG 68.0 10/29/2020   CHOLHDL 3 10/29/2020  -She is currently taking Zetia and Zocor for cholesterol control.  It is stated in her chart that she did not tolerate higher levels of statin therapy.  I discussed this again with the patient today and recommend additional lifestyle changes to help continue to lower her cholesterol to try to get her LDL under 70.  A handout was printed in her AVS as well.  6. Left ear impacted cerumen Ceruminosis is noted. Removal procedure explained and verbal consent obtained from patient. Wax is removed by syringing and manual debridement from left ear canal. Pt tolerated procedure well. Instructions for home care to prevent wax buildup are given.   This note was prepared with assistance of Systems analyst. Occasional wrong-word or sound-a-like substitutions may have  occurred due to the inherent limitations of voice recognition software.  Time Spent: 39 minutes of total time was spent on the date of the encounter performing the following actions: chart review prior to seeing the patient, obtaining history, performing a medically necessary exam, counseling on the treatment plan, placing orders, and documenting in our EHR.    Zerrick Hanssen M Zamarion Longest, PA-C

## 2020-12-30 ENCOUNTER — Encounter: Payer: Self-pay | Admitting: Physician Assistant

## 2020-12-30 ENCOUNTER — Other Ambulatory Visit: Payer: Self-pay | Admitting: Family Medicine

## 2021-01-05 ENCOUNTER — Other Ambulatory Visit: Payer: Self-pay | Admitting: Family Medicine

## 2021-01-05 DIAGNOSIS — E782 Mixed hyperlipidemia: Secondary | ICD-10-CM

## 2021-01-07 ENCOUNTER — Encounter: Payer: Medicare Other | Admitting: Physician Assistant

## 2021-01-12 ENCOUNTER — Encounter: Payer: Self-pay | Admitting: Physician Assistant

## 2021-01-19 ENCOUNTER — Ambulatory Visit: Payer: Medicare Other | Admitting: Dermatology

## 2021-01-29 ENCOUNTER — Other Ambulatory Visit: Payer: Self-pay

## 2021-01-29 ENCOUNTER — Emergency Department (HOSPITAL_BASED_OUTPATIENT_CLINIC_OR_DEPARTMENT_OTHER)
Admission: EM | Admit: 2021-01-29 | Discharge: 2021-01-29 | Disposition: A | Discharge status: Home / Self Care | Payer: Medicare Other | Attending: Emergency Medicine | Admitting: Emergency Medicine

## 2021-01-29 ENCOUNTER — Encounter (HOSPITAL_BASED_OUTPATIENT_CLINIC_OR_DEPARTMENT_OTHER): Payer: Self-pay | Admitting: Emergency Medicine

## 2021-01-29 DIAGNOSIS — M545 Low back pain, unspecified: Secondary | ICD-10-CM | POA: Insufficient documentation

## 2021-01-29 DIAGNOSIS — F17210 Nicotine dependence, cigarettes, uncomplicated: Secondary | ICD-10-CM | POA: Diagnosis not present

## 2021-01-29 DIAGNOSIS — Z96652 Presence of left artificial knee joint: Secondary | ICD-10-CM | POA: Diagnosis not present

## 2021-01-29 DIAGNOSIS — Z853 Personal history of malignant neoplasm of breast: Secondary | ICD-10-CM | POA: Insufficient documentation

## 2021-01-29 MED ORDER — TIZANIDINE HCL 4 MG PO TABS: 4.0000 mg | ORAL_TABLET | Freq: Four times a day (QID) | ORAL | 0 refills | Status: DC | PRN

## 2021-01-29 NOTE — Discharge Instructions (Signed)
Call your primary care doctor or specialist as discussed in the next 2-3 days.   Return immediately back to the ER if:  Your symptoms worsen within the next 12-24 hours. You develop new symptoms such as new fevers, persistent vomiting, new pain, shortness of breath, or new weakness or numbness, or if you have any other concerns.  

## 2021-01-29 NOTE — ED Provider Notes (Signed)
[facility] Provider Note   CSN: [id number] Arrival date & time: 01/29/21  1257     History Chief Complaint  Patient presents with   Back Pain   Ear Fullness    Alyssa Deleon is a 65 y.o. female.  Patient presents ER chief complaint of lower back pain.  She states she has chronic pain and typically takes oxycodone.  Yesterday she was bending down to pick up a [employer] box which was too heavy for her.  As she was lifting she felt a pop in her lower back and then severe back pain.  She denies any fall or trauma she had to sit down.  She states the pain is still bothering her today.  She states when she lies still the pain is minimal but when she transitions from sitting to standing or standing to sitting the pain is worse sharp lower back pain rating down the left leg.  Denies any fevers or cough or vomiting or diarrhea no new numbness or weakness.  No loss of bowel or bladder dysfunction.      Past Medical History:  Diagnosis Date   Breast cancer ([place])    Cataract, left 09/29/2017   Chronic pain syndrome, on Neurontin 09/30/2017   Dry eye syndrome of both eyes, on Restasis 09/30/2017   Emphysema lung ([place])    Family history of breast cancer    Family history of melanoma    Family history of ovarian cancer    Family history of prostate cancer    Family history of throat cancer    Frequent urinary tract infections    GAD (generalized anxiety disorder) 03/11/2015   Genital herpes    History of chicken pox    History of degenerative disc disease 10/27/2002   Knee osteonecrosis, 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/29/2017   Major depression, recurrent, chronic ([place]), followed by Psych, on Wellbutrin, Cymbalta 09/30/2017   Mixed hyperlipidemia, on Zetia and Zocor 09/30/2017   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   MVP (mitral valve prolapse) 03/13/2015   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   Nuclear sclerosis 06/10/2013   Panic disorder, followed by  Psych, on Xanax 09/30/2017   Personal history of breast cancer    Psoriasis    PTSD (post-traumatic stress disorder) 03/11/2015   Stroke ([place])    Total knee replacement status, 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   Vitamin D deficiency 09/30/2017    Patient Active Problem List   Diagnosis Date Noted   Aortic atherosclerosis ([place]) 10/29/2020   Emphysema lung ([place]) 10/29/2020   Genetic testing 03/27/2018   Personal history of breast cancer    Bilateral lower extremity edema 02/03/2018   Epidermoid cyst of skin 02/03/2018   B12 deficiency 02/03/2018   Mixed hyperlipidemia, on Zetia and Zocor 09/30/2017   Chronic pain syndrome, on Neurontin 09/30/2017   Vitamin D deficiency 09/30/2017   Major depression, recurrent, chronic ([place]), followed by Psych, on Wellbutrin, Cymbalta 09/30/2017   Panic disorder, followed by Psych, on Xanax 09/30/2017   Dry eye syndrome of both eyes, on Restasis 09/30/2017   History of stroke, on ASA 09/30/2017   Cataract, left 09/29/2017   Knee osteonecrosis, left ([place]) 09/29/2017   Chronic low back pain 05/24/2017   MVP (mitral valve prolapse) 03/13/2015   GAD (generalized anxiety disorder) 03/11/2015   PTSD (post-traumatic stress disorder) 03/11/2015   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     Family History  Problem Relation Age of Onset   Cancer - Other Mother    Miscarriages / [place] Mother    Alzheimer's disease Mother    Breast cancer Mother    Skin cancer Mother    Cancer - Prostate Father        metastatic   Hyperlipidemia Father     Throat cancer Father    Cancer - Other Sister    Depression Sister    Hearing loss Sister    Hyperlipidemia Sister    Breast cancer Sister        both breasts   Skin cancer Sister    Cancer - Other Brother    Throat cancer Brother    Cancer - Other Sister    Breast cancer Sister 18   Breast cancer Maternal Grandmother    Alzheimer's disease Maternal Grandfather    Ovarian cancer Paternal Grandmother    Heart attack Paternal Grandfather    Breast cancer Paternal Aunt    Prostate cancer Paternal Uncle    Breast cancer Other        both breasts dx 89    Social History   Tobacco Use   Smoking status: Every Day    Packs/day: 0.50    Types: Cigarettes   Smokeless tobacco: Never  Substance Use Topics   Alcohol use: No    Alcohol/week: 0.0 standard drinks   Drug use: Never    Home Medications Prior to Admission medications   Medication Sig Start Date End Date Taking? Authorizing Provider  tiZANidine (ZANAFLEX) 4 MG tablet Take 1 tablet (4 mg total) by mouth every 6 (six) hours as needed for up to 15 doses for muscle spasms. 01/29/21  Yes Pinkney Venard, Greggory Brandy, MD  ALPRAZolam Duanne Moron) 1 MG tablet Take 3 tablets (3 mg total) by mouth at bedtime as needed for anxiety. 12/10/18   Briscoe Deutscher, DO  buPROPion (WELLBUTRIN XL) 300 MG 24 hr tablet Take 1 tablet (300 mg total) by mouth daily. 11/28/18   Briscoe Deutscher, DO  DULoxetine (CYMBALTA) 60 MG capsule Take 1 capsule (60 mg total) by mouth daily. 11/28/18   Briscoe Deutscher, DO  ezetimibe (ZETIA) 10 MG tablet TAKE 1 TABLET BY MOUTH  DAILY 01/05/21   Allwardt, Alyssa M, PA-C  gabapentin (NEURONTIN) 300 MG capsule Take 1 capsule (300 mg total) by mouth 2 (two) times daily. 11/28/18   Briscoe Deutscher, DO  oxyCODONE-acetaminophen (PERCOCET/ROXICET) 5-325 MG tablet Take 1 tablet by mouth every 6 (six) hours as needed for severe pain. 12/15/20   Horton, Barbette Hair, MD  pantoprazole (PROTONIX) 40 MG tablet TAKE 1 TABLET BY MOUTH EVERY DAY 01/06/21    Allwardt, Randa Evens, PA-C  simvastatin (ZOCOR) 40 MG tablet TAKE 1 TABLET BY MOUTH  DAILY 12/29/20   Allwardt, Alyssa M, PA-C    Allergies    Hydrocodone-acetaminophen, Klonopin [clonazepam], Crestor [rosuvastatin], Morphine sulfate, Effexor [venlafaxine], Sulfa antibiotics, and Vytorin [ezetimibe-simvastatin]  Review of Systems   Review of Systems  Constitutional:  Negative for fever.  HENT:  Negative for ear pain.   Eyes:  Negative for pain.  Respiratory:  Negative for cough.   Cardiovascular:  Negative for chest pain.  Gastrointestinal:  Negative for abdominal pain.  Genitourinary:  Negative for flank pain.  Musculoskeletal:  Positive for back pain.  Skin:  Negative for rash.  Neurological:  Negative for headaches.   Physical Exam Updated Vital Signs BP (!) 109/55 (BP Location: Right Arm)   Pulse (!) 102   Temp 98.5 F (36.9 C)   Resp 16   Ht 5' 8.5" (1.74 m)   Wt 65.8 kg   SpO2 95%   BMI 21.73 kg/m   Physical Exam Constitutional:      General: She is not in acute distress.    Appearance: Normal appearance.  HENT:     Head: Normocephalic.     Right Ear: Tympanic membrane normal.     Ears:     Comments: Left TM has some fluid behind it but no earwax obstruction noted.    Nose: Nose normal.  Eyes:     Extraocular Movements: Extraocular movements intact.  Cardiovascular:     Rate and Rhythm: Normal rate.  Pulmonary:     Effort: Pulmonary effort is normal.  Abdominal:     Tenderness: There is no abdominal tenderness.     Hernia: No hernia is present.  Musculoskeletal:        General: Normal range of motion.     Cervical back: Normal range of motion.     Comments: No tear C-spine midline step-offs or tenderness noted.  L-spine 3-4 left paraspinal tenderness noted.  No step-offs noted.  Neurological:     General: No focal deficit present.     Mental Status: She is alert. Mental status is at baseline.     Comments: Patient is otherwise neuro intact, antalgic gait  but able to walk without assistance.  No saddle anesthesia.    ED Results / Procedures / Treatments   Labs (all labs ordered are listed, but only abnormal results are displayed) Labs Reviewed - No data to display  EKG None  Radiology No results found.  Procedures Procedures   Medications Ordered in ED Medications - No data to display  ED Course  I have reviewed the triage vital signs and the nursing notes.  Pertinent labs & imaging results that were available during my care of the patient were reviewed by me and considered in my medical decision making (see chart for details).    MDM Rules/Calculators/A&P                           Patient states he has a history of left ear trauma and the fluid behind her ear is normal for her.  She is asking for muscle relaxants to take in addition to her 10/325 Percocet she has been taking at home with her pain specialist.  Advising immediate return if she has new numbness or weakness otherwise continue management with her pain specialist.  Discharged home stable condition, advised immediate return for any additional concerns.  Final Clinical Impression(s) / ED Diagnoses Final diagnoses:  Acute left-sided low back pain, unspecified whether sciatica present    Rx / DC Orders ED Discharge Orders          Ordered    tiZANidine (ZANAFLEX) 4 MG tablet  Every 6 hours PRN        01/29/21 1559             Luna Fuse, MD 01/29/21 1600

## 2021-01-29 NOTE — ED Triage Notes (Signed)
Pt reports picking up package yesterday, "heard a pop" when standing up. Hx of degenerative disk disease.

## 2021-02-08 ENCOUNTER — Ambulatory Visit: Payer: Medicare Other | Admitting: Dermatology

## 2021-02-15 ENCOUNTER — Encounter: Payer: Self-pay | Admitting: Physician Assistant

## 2021-02-17 ENCOUNTER — Other Ambulatory Visit: Payer: Self-pay

## 2021-02-17 DIAGNOSIS — F063 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, unspecified: Secondary | ICD-10-CM

## 2021-02-17 DIAGNOSIS — G8929 Other chronic pain: Secondary | ICD-10-CM

## 2021-02-22 NOTE — Telephone Encounter (Signed)
See note

## 2021-02-23 ENCOUNTER — Ambulatory Visit: Payer: Medicare Other | Admitting: Physician Assistant

## 2021-02-25 ENCOUNTER — Telehealth: Payer: Self-pay

## 2021-02-25 ENCOUNTER — Ambulatory Visit: Payer: Medicare Other | Admitting: Physician Assistant

## 2021-02-25 ENCOUNTER — Other Ambulatory Visit: Payer: Self-pay

## 2021-02-25 VITALS — BP 104/70 | HR 89 | Temp 98.2°F | Ht 68.5 in | Wt 182.2 lb

## 2021-02-25 DIAGNOSIS — G8929 Other chronic pain: Secondary | ICD-10-CM

## 2021-02-25 DIAGNOSIS — E782 Mixed hyperlipidemia: Secondary | ICD-10-CM

## 2021-02-25 DIAGNOSIS — F410 Panic disorder [episodic paroxysmal anxiety] without agoraphobia: Secondary | ICD-10-CM | POA: Diagnosis not present

## 2021-02-25 DIAGNOSIS — F063 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, unspecified: Secondary | ICD-10-CM | POA: Diagnosis not present

## 2021-02-25 DIAGNOSIS — Z79899 Other long term (current) drug therapy: Secondary | ICD-10-CM

## 2021-02-25 DIAGNOSIS — G894 Chronic pain syndrome: Secondary | ICD-10-CM

## 2021-02-25 DIAGNOSIS — M5442 Lumbago with sciatica, left side: Secondary | ICD-10-CM

## 2021-02-25 DIAGNOSIS — M5441 Lumbago with sciatica, right side: Secondary | ICD-10-CM

## 2021-02-25 MED ORDER — SIMVASTATIN 40 MG PO TABS: 40.0000 mg | ORAL_TABLET | Freq: Every day | ORAL | 3 refills | Status: DC

## 2021-02-25 MED ORDER — MELATONIN 3 MG PO TABS: 3.0000 mg | ORAL_TABLET | Freq: Every day | ORAL | 0 refills | Status: AC

## 2021-02-25 MED ORDER — GABAPENTIN 300 MG PO CAPS: 300.0000 mg | ORAL_CAPSULE | Freq: Two times a day (BID) | ORAL | 3 refills | Status: DC

## 2021-02-25 MED ORDER — DULOXETINE HCL 60 MG PO CPEP
60.0000 mg | ORAL_CAPSULE | Freq: Every day | ORAL | 3 refills | Status: DC
Start: 2021-02-25 — End: 2022-03-18

## 2021-02-25 MED ORDER — ALPRAZOLAM 1 MG PO TABS: 2.0000 mg | ORAL_TABLET | Freq: Every evening | ORAL | 1 refills | Status: DC | PRN

## 2021-02-25 MED ORDER — BUPROPION HCL ER (XL) 300 MG PO TB24: 300.0000 mg | ORAL_TABLET | Freq: Every day | ORAL | 3 refills | Status: DC

## 2021-02-25 MED ORDER — EZETIMIBE 10 MG PO TABS: 10.0000 mg | ORAL_TABLET | Freq: Every day | ORAL | 3 refills | Status: DC

## 2021-02-25 NOTE — Telephone Encounter (Signed)
Took care of this at appt today

## 2021-02-25 NOTE — Progress Notes (Signed)
Subjective:    Patient ID: Alyssa Deleon, female    DOB: September 18, 1955, 65 y.o.   MRN: [id number]  Chief Complaint  Patient presents with   Follow-up     HPI Patient is in today for medication management and recheck. Please see A/P for details.   Past Medical History:  Diagnosis Date   Breast cancer ([place])    Cataract, left 09/29/2017   Chronic pain syndrome, on Neurontin 09/30/2017   Dry eye syndrome of both eyes, on Restasis 09/30/2017   Emphysema lung ([place])    Family history of breast cancer    Family history of melanoma    Family history of ovarian cancer    Family history of prostate cancer    Family history of throat cancer    Frequent urinary tract infections    GAD (generalized anxiety disorder) 03/11/2015   Genital herpes    History of chicken pox    History of degenerative disc disease 10/27/2002   Knee osteonecrosis, 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/29/2017   Major depression, recurrent, chronic ([place]), followed by Psych, on Wellbutrin, Cymbalta 09/30/2017   Mixed hyperlipidemia, on Zetia and Zocor 09/30/2017   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   MVP (mitral valve prolapse) 03/13/2015   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   Nuclear sclerosis 06/10/2013   Panic disorder, followed by Psych, on Xanax 09/30/2017   Personal history of breast cancer    Psoriasis    PTSD (post-traumatic stress disorder) 03/11/2015   Stroke ([place])    Total knee replacement status, 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   Vitamin D deficiency 09/30/2017    Past Surgical History:  Procedure Laterality Date   AUGMENTATION MAMMAPLASTY Bilateral    BREAST BIOPSY     MASTECTOMY Left 1995   has a tram flap   RECONSTRUCTION BREAST W/ TRAM FLAP Left 2003    Family History  Problem Relation Age of Onset   Cancer - Other Mother    Miscarriages / [place] Mother    Alzheimer's disease Mother    Breast cancer Mother    Skin cancer Mother    Cancer - Prostate Father        metastatic    Hyperlipidemia Father    Throat cancer Father    Cancer - Other Sister    Depression Sister    Hearing loss Sister    Hyperlipidemia Sister    Breast cancer Sister        both breasts   Skin cancer Sister    Cancer - Other Brother    Throat cancer Brother    Cancer - Other Sister    Breast cancer Sister 63   Breast cancer Maternal Grandmother    Alzheimer's disease Maternal Grandfather    Ovarian cancer Paternal Grandmother    Heart attack Paternal Grandfather    Breast cancer Paternal Aunt    Prostate cancer Paternal Uncle    Breast cancer Other        both breasts dx 41    Social History   Tobacco Use   Smoking status: Every Day    Packs/day: 0.50    Types: Cigarettes   Smokeless tobacco: Never  Substance Use Topics   Alcohol use: No    Alcohol/week: 0.0 standard drinks   Drug use: Never     Allergies  Allergen Reactions   Hydrocodone-Acetaminophen Shortness Of Breath   Klonopin [Clonazepam] Swelling   Crestor [Rosuvastatin] Other (See Comments)    Myalgias   Morphine Sulfate Itching   Effexor [  Venlafaxine] Other (See Comments)    Weight gain   Sulfa Antibiotics Diarrhea and Other (See Comments)   Vytorin [Ezetimibe-Simvastatin] Other (See Comments)    Myalgias    Review of Systems NEGATIVE UNLESS OTHERWISE INDICATED IN HPI      Objective:     BP 104/70   Pulse 89   Temp 98.2 F (36.8 C)   Ht 5' 8.5" (1.74 m)   Wt 182 lb 4 oz (82.7 kg)   SpO2 98%   BMI 27.31 kg/m   Wt Readings from Last 3 Encounters:  02/25/21 182 lb 4 oz (82.7 kg)  01/29/21 145 lb (65.8 kg)  12/29/20 184 lb 8 oz (83.7 kg)    BP Readings from Last 3 Encounters:  02/25/21 104/70  01/29/21 (!) 109/55  12/29/20 115/75     Physical Exam Vitals and nursing note reviewed.  Constitutional:      Appearance: Normal appearance. She is normal weight. She is not toxic-appearing.  HENT:     Head: Normocephalic and atraumatic.     Right Ear: Tympanic membrane, ear canal and  external ear normal.     Left Ear: Tympanic membrane, ear canal and external ear normal.     Nose: Nose normal.     Mouth/Throat:     Mouth: Mucous membranes are moist.  Eyes:     Extraocular Movements: Extraocular movements intact.     Conjunctiva/sclera: Conjunctivae normal.     Pupils: Pupils are equal, round, and reactive to light.  Cardiovascular:     Rate and Rhythm: Normal rate and regular rhythm.     Pulses: Normal pulses.     Heart sounds: Normal heart sounds.  Pulmonary:     Effort: Pulmonary effort is normal.     Breath sounds: Normal breath sounds.  Musculoskeletal:        General: Normal range of motion.     Cervical back: Normal range of motion and neck supple.  Skin:    General: Skin is warm and dry.  Neurological:     General: No focal deficit present.     Mental Status: She is alert and oriented to person, place, and time.  Psychiatric:        Mood and Affect: Mood normal.        Behavior: Behavior normal.        Thought Content: Thought content normal.        Judgment: Judgment normal.       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 High risk medication use 2. Panic disorder, followed by Psych, on Xanax 3. 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 She has an extra dose to use as needed for panic attacks, but says she never needs it for this anymore. She has been on this for 20+ years after her stroke.  -I will be taking over management of her Xanax to  help wean her off altogether is her goal.   None opioid contract signed with me today. -PDMP reviewed today, no red flags, filling appropriately.  -She is going to start taking only Xanax 1 mg p.o. 1 and half tablets every night for the next 3 months until we have another appointment.  She understands that this needs to be a slow process as to avoid any possible withdrawal complications.  She will call me if she has any concerns. -Also plan to start her on melatonin at bedtime, as it seems that she is still struggling with sleep.  She is doing well with her Wellbutrin and Cymbalta and refills for these medications were sent.  4. Chronic bilateral low back pain with bilateral sciatica 5. Chronic pain syndrome, on Neurontin -Followed by Dr. Nelva Bush, Dr. Ricard Dillon, and Dr. Buford Dresser  -I will manage Gabapentin 300 mg - she is taking this only BID, Rx sent for refill today. She is going to try taking only one per day and see how she does and let me know.   6. Mixed hyperlipidemia, on Zetia and Zocor Lab Results  Component Value Date   CHOL 172 10/29/2020   HDL 68.10 10/29/2020   LDLCALC 90 10/29/2020   TRIG 68.0 10/29/2020   CHOLHDL 3 10/29/2020   -Stable, refilled her Zetia and Zocor    This note was prepared with assistance of Dragon voice recognition software. Occasional wrong-word or sound-a-like substitutions may have occurred due to the inherent limitations of voice recognition software.  Time Spent: 36 minutes of total time was spent on the date of the encounter performing the following actions: chart review prior to seeing the patient, obtaining history, performing a medically necessary exam, counseling on the treatment plan, placing orders, and documenting in our EHR.    Thea Holshouser M Bless Lisenby, PA-C

## 2021-02-25 NOTE — Patient Instructions (Signed)
Great to see you today!  Your prescriptions have been sent in. I want you to try melatonin at bedtime to see if this helps you fall asleep. We are also going to slowly wean off the Xanax. Take only 1.5 tab every night until we meet again in 3 months.  Call sooner if any concerns.  Merry Christmas!

## 2021-02-25 NOTE — Telephone Encounter (Signed)
Pt called in stating that Alyssa sent in a few refills for her but did not call in the generic medications. Pt stated that she is needing the generic for: buPROPion (WELLBUTRIN XL) 300 MG 24 hr tablet  ezetimibe (ZETIA) 10 MG tablet  DULoxetine (CYMBALTA) 60 MG capsule  Please Advise.

## 2021-02-26 NOTE — Telephone Encounter (Signed)
Left detailed message that patient that generic Rx has been sent to the pharmacy

## 2021-03-01 ENCOUNTER — Encounter: Payer: Self-pay | Admitting: Physician Assistant

## 2021-03-16 ENCOUNTER — Ambulatory Visit: Payer: Medicare Other | Admitting: Dermatology

## 2021-03-29 ENCOUNTER — Other Ambulatory Visit: Payer: Self-pay

## 2021-03-29 ENCOUNTER — Emergency Department (HOSPITAL_BASED_OUTPATIENT_CLINIC_OR_DEPARTMENT_OTHER): Payer: Medicare Other | Admitting: Radiology

## 2021-03-29 ENCOUNTER — Encounter (HOSPITAL_BASED_OUTPATIENT_CLINIC_OR_DEPARTMENT_OTHER): Payer: Self-pay | Admitting: Obstetrics and Gynecology

## 2021-03-29 ENCOUNTER — Other Ambulatory Visit (HOSPITAL_BASED_OUTPATIENT_CLINIC_OR_DEPARTMENT_OTHER): Payer: Self-pay

## 2021-03-29 ENCOUNTER — Emergency Department (HOSPITAL_BASED_OUTPATIENT_CLINIC_OR_DEPARTMENT_OTHER)
Admission: EM | Admit: 2021-03-29 | Discharge: 2021-03-29 | Disposition: A | Discharge status: Home / Self Care | Payer: Medicare Other | Attending: Emergency Medicine | Admitting: Emergency Medicine

## 2021-03-29 DIAGNOSIS — W01198A Fall on same level from slipping, tripping and stumbling with subsequent striking against other object, initial encounter: Secondary | ICD-10-CM | POA: Diagnosis not present

## 2021-03-29 DIAGNOSIS — S2241XA Multiple fractures of ribs, right side, initial encounter for closed fracture: Secondary | ICD-10-CM | POA: Insufficient documentation

## 2021-03-29 DIAGNOSIS — Z79899 Other long term (current) drug therapy: Secondary | ICD-10-CM | POA: Insufficient documentation

## 2021-03-29 DIAGNOSIS — M791 Myalgia, unspecified site: Secondary | ICD-10-CM | POA: Diagnosis not present

## 2021-03-29 DIAGNOSIS — W19XXXA Unspecified fall, initial encounter: Secondary | ICD-10-CM

## 2021-03-29 DIAGNOSIS — S299XXA Unspecified injury of thorax, initial encounter: Secondary | ICD-10-CM | POA: Diagnosis present

## 2021-03-29 MED ORDER — METHOCARBAMOL 500 MG PO TABS
500.0000 mg | ORAL_TABLET | Freq: Four times a day (QID) | ORAL | 0 refills | Status: DC
  Filled 2021-03-29: qty 20, 5d supply, fill #0

## 2021-03-29 MED ORDER — OXYCODONE HCL 5 MG PO TABS
5.0000 mg | ORAL_TABLET | Freq: Four times a day (QID) | ORAL | 0 refills | Status: DC | PRN
  Filled 2021-03-29: qty 8, 3d supply, fill #0

## 2021-03-29 MED ORDER — NAPROXEN 500 MG PO TABS
500.0000 mg | ORAL_TABLET | Freq: Two times a day (BID) | ORAL | 0 refills | Status: DC
  Filled 2021-03-29: qty 20, 10d supply, fill #0

## 2021-03-29 NOTE — ED Provider Notes (Signed)
[facility] EMERGENCY DEPT Provider Note   CSN: [id number] Arrival date & time: 03/29/21  1126     History  Chief Complaint  Patient presents with   Alyssa Deleon is a 66 y.o. female.  Patient with history of stroke, anxiety --presents to the emergency department for evaluation of right-sided anterior chest pain starting acutely after a fall 4 days ago.  Patient states that she was at her daughter's house and she stood up from sitting on the floor.  She slipped and fell forward landing on her right side.  She did the same thing a few months ago and broke a rib.  This pain feels similar.  She has been taking home oxycodone that she had from her pain management provider.  She is now out.  She has tried a muscle relaxer in the past however this did not help her and made her feel worse.  No shortness of breath or difficulty breathing.  Pain is worse with deep breathing and palpation.  She states that she has a incentive spirometer at home.  Per review of [place] substance reporting database, patient has not received prescription for narcotic pain medication in over 2 months.  She does not receive this on a regular basis.      Home Medications Prior to Admission medications   Medication Sig Start Date End Date Taking? Authorizing Provider  methocarbamol (ROBAXIN) 500 MG tablet Take 1 tablet (500 mg total) by mouth 4 (four) times daily. 03/29/21  Yes Carlisle Cater, PA-C  naproxen (NAPROSYN) 500 MG tablet Take 1 tablet (500 mg total) by mouth 2 (two) times daily. 03/29/21  Yes Carlisle Cater, PA-C  oxyCODONE (OXY IR/ROXICODONE) 5 MG immediate release tablet Take 1 tablet (5 mg total) by mouth every 6 (six) hours as needed for severe pain. 03/29/21  Yes Carlisle Cater, PA-C  ALPRAZolam Duanne Moron) 1 MG tablet Take 2 tablets (2 mg total) by mouth at bedtime as needed for anxiety. 02/25/21   Allwardt, Randa Evens, PA-C  buPROPion (WELLBUTRIN XL) 300 MG 24 hr tablet Take 1 tablet  (300 mg total) by mouth daily. 02/25/21   Allwardt, Randa Evens, PA-C  DULoxetine (CYMBALTA) 60 MG capsule Take 1 capsule (60 mg total) by mouth daily. 02/25/21   Allwardt, Randa Evens, PA-C  ezetimibe (ZETIA) 10 MG tablet Take 1 tablet (10 mg total) by mouth daily. 02/25/21   Allwardt, Randa Evens, PA-C  gabapentin (NEURONTIN) 300 MG capsule Take 1 capsule (300 mg total) by mouth 2 (two) times daily. 02/25/21 05/26/21  Allwardt, Alyssa M, PA-C  melatonin 3 MG TABS tablet Take 1 tablet (3 mg total) by mouth at bedtime. 02/25/21 05/26/21  Allwardt, Randa Evens, PA-C  pantoprazole (PROTONIX) 40 MG tablet TAKE 1 TABLET BY MOUTH EVERY DAY 01/06/21   Allwardt, Alyssa M, PA-C  simvastatin (ZOCOR) 40 MG tablet Take 1 tablet (40 mg total) by mouth daily. 02/25/21   Allwardt, Randa Evens, PA-C      Allergies    Hydrocodone-acetaminophen, Klonopin [clonazepam], Crestor [rosuvastatin], Morphine sulfate, Effexor [venlafaxine], Sulfa antibiotics, and Vytorin [ezetimibe-simvastatin]    Review of Systems   Review of Systems  Constitutional:  Negative for fever.  Respiratory:  Negative for cough and shortness of breath.   Cardiovascular:  Positive for chest pain (Chest wall pain).  Gastrointestinal:  Negative for nausea and vomiting.  Musculoskeletal:  Positive for myalgias.   Physical Exam Updated Vital Signs BP 116/78    Pulse 93  Temp 98.1 F (36.7 C)    Resp 16    Ht 5' 8.5" (1.74 m)    Wt 81.6 kg    SpO2 98%    BMI 26.97 kg/m  Physical Exam Vitals and nursing note reviewed.  Constitutional:      Appearance: She is well-developed. She is not diaphoretic.  HENT:     Head: Normocephalic and atraumatic.     Mouth/Throat:     Mouth: Mucous membranes are not dry.  Eyes:     Conjunctiva/sclera: Conjunctivae normal.  Neck:     Vascular: Normal carotid pulses. No JVD.     Trachea: Trachea normal. No tracheal deviation.  Cardiovascular:     Rate and Rhythm: Normal rate and regular rhythm.     Pulses: No decreased  pulses.          Radial pulses are 2+ on the right side and 2+ on the left side.     Heart sounds: Normal heart sounds, S1 normal and S2 normal. No murmur heard. Pulmonary:     Effort: Pulmonary effort is normal. No respiratory distress.     Breath sounds: No wheezing.  Chest:     Chest wall: Tenderness present.    Abdominal:     General: Bowel sounds are normal.     Palpations: Abdomen is soft.     Tenderness: There is no abdominal tenderness. There is no guarding or rebound.  Musculoskeletal:        General: Normal range of motion.     Cervical back: Normal range of motion and neck supple. No muscular tenderness.  Skin:    General: Skin is warm and dry.     Coloration: Skin is not pale.  Neurological:     Mental Status: She is alert.    ED Results / Procedures / Treatments   Labs (all labs ordered are listed, but only abnormal results are displayed) Labs Reviewed - No data to display  EKG None  Radiology DG Ribs Unilateral W/Chest Right  Result Date: 03/29/2021 CLINICAL DATA:  Trauma, fall, pain EXAM: RIGHT RIBS AND CHEST - 3+ VIEW COMPARISON:  12/14/2020 FINDINGS: Transverse diameter of heart is increased. There are no signs of pulmonary edema or new focal infiltrates. There is no pleural effusion or pneumothorax. Surgical clips are seen in the left axilla. Linear density in the right parahilar region has not changed. This may suggest scarring. Deformities are seen in the anterolateral aspect of right 5th to 8th ribs. Surgical clips are seen in the right upper quadrant of abdomen. IMPRESSION: Deformity is seen in the anterolateral aspect of right fifth through the eighth ribs suggesting essentially undisplaced recent fractures. There is no focal pulmonary contusion. There is no pleural effusion or pneumothorax. Electronically Signed   By: Elmer Picker M.D.   On: 03/29/2021 12:07    Procedures Procedures    Medications Ordered in ED Medications - No data to  display  ED Course/ Medical Decision Making/ A&P    Patient seen and examined. Plan discussed with patient.   Labs: None  Imaging: Chest x-ray ordered in triage demonstrates acute fractures of the right fifth through eighth ribs  Medications/Fluids: Prescription for oxycodone #8, Robaxin, naproxen  Vital signs reviewed and are as follows: BP 116/78    Pulse 93    Temp 98.1 F (36.7 C)    Resp 16    Ht 5' 8.5" (1.74 m)    Wt 81.6 kg    SpO2 98%  BMI 26.97 kg/m   Initial impression: Fall, rib fractures  At this point, patient is talking and breathing well and appears comfortable during exam.  Will provide medication for pain, muscle relaxer, anti-inflammatories.  Patient courage to continue incentive spirometer and follow-up with PCP or pain management for continued treatment.  Encouraged return to the emergency department with worsening difficulty breathing, shortness of breath, cough, fever, or other concerns.  Patient counseled on use of narcotic pain medications. Counseled not to combine these medications with others containing tylenol. Urged not to drink alcohol, drive, or perform any other activities that requires focus while taking these medications. The patient verbalizes understanding and agrees with the plan.                           Medical Decision Making  Patient with fall and rib pain.  X-ray suggestive of new rib fractures.  She has 4 rib fractures however appears quite comfortable during exam.  Oxygen saturations 100%.  No signs of pulmonary contusions or pneumothorax.  Considered CT imaging to further evaluate injury, but patient's exam is reassuring.  I am comfortable with discharged home at this time.  Seems reliable to return with worsening.        Final Clinical Impression(s) / ED Diagnoses Final diagnoses:  Closed fracture of multiple ribs of right side, initial encounter  Fall, initial encounter    Rx / DC Orders ED Discharge Orders           Ordered    oxyCODONE (OXY IR/ROXICODONE) 5 MG immediate release tablet  Every 6 hours PRN        03/29/21 1436    methocarbamol (ROBAXIN) 500 MG tablet  4 times daily        03/29/21 1436    naproxen (NAPROSYN) 500 MG tablet  2 times daily        03/29/21 1436              Carlisle Cater, PA-C 03/29/21 1442    Regan Lemming, MD 03/29/21 1649

## 2021-03-29 NOTE — ED Triage Notes (Signed)
Patient reports she was on the wood floors playing with her grandson and she fell and hit the right side of her rib cage. Patient reports she has broken her ribs before and that is what this feels like.

## 2021-03-30 ENCOUNTER — Encounter: Payer: Self-pay | Admitting: Physician Assistant

## 2021-04-05 ENCOUNTER — Ambulatory Visit: Payer: Medicare Other | Admitting: Physician Assistant

## 2021-04-06 ENCOUNTER — Ambulatory Visit: Payer: Medicare Other | Admitting: Dermatology

## 2021-04-07 ENCOUNTER — Encounter: Payer: Self-pay | Admitting: Physician Assistant

## 2021-04-07 ENCOUNTER — Telehealth (INDEPENDENT_AMBULATORY_CARE_PROVIDER_SITE_OTHER): Payer: Medicare Other | Admitting: Physician Assistant

## 2021-04-07 DIAGNOSIS — I517 Cardiomegaly: Secondary | ICD-10-CM | POA: Diagnosis not present

## 2021-04-07 DIAGNOSIS — M8580 Other specified disorders of bone density and structure, unspecified site: Secondary | ICD-10-CM

## 2021-04-07 DIAGNOSIS — J439 Emphysema, unspecified: Secondary | ICD-10-CM | POA: Diagnosis not present

## 2021-04-07 DIAGNOSIS — Z87891 Personal history of nicotine dependence: Secondary | ICD-10-CM

## 2021-04-07 DIAGNOSIS — Z780 Asymptomatic menopausal state: Secondary | ICD-10-CM

## 2021-04-07 NOTE — Progress Notes (Signed)
Virtual Visit via Video Note  I connected with  Alyssa Deleon  on 04/07/21 at 11:30 AM EST by a video enabled telemedicine application and verified that I am speaking with the correct person using two identifiers.  Location: Patient: home Provider: Therapist, music at [place] present: Patient and myself   I discussed the limitations of evaluation and management by telemedicine and the availability of in person appointments. The patient expressed understanding and agreed to proceed.   History of Present Illness: Pleasant 66 year old patient presents for virtual visit today to discuss recent ED visit on 03/29/2021 after having an accidental fall as she stood up from sitting.  She fractured 4 right ribs and she was discharged home in stable condition.  Seems to be doing well with the pain at this time.  But also wanted to discuss several concerning findings that she noted on her imaging report including enlarged heart.  I also requested that she discuss with me about her history of osteopenia and wanted to discuss plans moving forward.  As well as discussing CT screening for lung cancer.   Observations/Objective:   Gen: Awake, alert, no acute distress Resp: Breathing is even and non-labored Psych: calm/pleasant demeanor Neuro: Alert and Oriented x 3, + facial symmetry, speech is clear.   Assessment and Plan:  1. Osteopenia, unspecified location 2. Postmenopausal She has had several fractures recently.  Last bone scan reported was on 05/23/2005 and showed low bone mass.  We need to plan to repeat her DEXA scan. Recommend 1200mg  calcium and 800-1000IU/vitamin D daily.  Fall precautions discussed.  3. Cardiomegaly As noted on recent rib series on 03/29/2021.  Plan to order echocardiogram.  Thankfully no cardiac symptoms.  4. Pulmonary emphysema, unspecified emphysema type ([place]) 5. Former smoker She needs to have a low-dose screening CT done for lung cancer.  Order  placed for this today.   Follow Up Instructions:    I discussed the assessment and treatment plan with the patient. The patient was provided an opportunity to ask questions and all were answered. The patient agreed with the plan and demonstrated an understanding of the instructions.   The patient was advised to call back or seek an in-person evaluation if the symptoms worsen or if the condition fails to improve as anticipated.  Video connection was lost at >50% of the duration of the visit, at which time the remainder of the visit was completed via audio only.  Total time on phone: 15 min, 20 sec  Teisha Trowbridge M Adrick Kestler, PA-C

## 2021-04-08 ENCOUNTER — Encounter: Payer: Self-pay | Admitting: Physician Assistant

## 2021-04-11 ENCOUNTER — Encounter: Payer: Self-pay | Admitting: Physician Assistant

## 2021-04-15 ENCOUNTER — Other Ambulatory Visit: Payer: Self-pay | Admitting: Physician Assistant

## 2021-04-15 DIAGNOSIS — Z1231 Encounter for screening mammogram for malignant neoplasm of breast: Secondary | ICD-10-CM

## 2021-04-19 ENCOUNTER — Encounter: Payer: Self-pay | Admitting: Physician Assistant

## 2021-04-20 NOTE — Telephone Encounter (Signed)
See note

## 2021-04-26 ENCOUNTER — Ambulatory Visit (HOSPITAL_COMMUNITY): Payer: Medicare Other

## 2021-04-28 ENCOUNTER — Other Ambulatory Visit: Payer: Self-pay | Admitting: Physician Assistant

## 2021-04-28 NOTE — Telephone Encounter (Signed)
Last ct order placed in July.

## 2021-05-11 ENCOUNTER — Other Ambulatory Visit: Payer: Self-pay

## 2021-05-11 ENCOUNTER — Ambulatory Visit (HOSPITAL_COMMUNITY)
Admission: RE | Admit: 2021-05-11 | Discharge: 2021-05-11 | Disposition: A | Discharge status: Home / Self Care | Payer: Medicare Other | Source: Ambulatory Visit | Attending: Physician Assistant | Admitting: Physician Assistant

## 2021-05-11 ENCOUNTER — Encounter: Payer: Self-pay | Admitting: Physician Assistant

## 2021-05-11 DIAGNOSIS — E785 Hyperlipidemia, unspecified: Secondary | ICD-10-CM | POA: Insufficient documentation

## 2021-05-11 DIAGNOSIS — I3139 Other pericardial effusion (noninflammatory): Secondary | ICD-10-CM | POA: Diagnosis not present

## 2021-05-11 DIAGNOSIS — I517 Cardiomegaly: Secondary | ICD-10-CM | POA: Diagnosis present

## 2021-05-11 LAB — ECHOCARDIOGRAM COMPLETE
Area-P 1/2: 2.87 cm2
Calc EF: 46 %
S' Lateral: 3.8 cm
Single Plane A2C EF: 47.4 %
Single Plane A4C EF: 49.5 %

## 2021-05-13 ENCOUNTER — Other Ambulatory Visit: Payer: Self-pay | Admitting: *Deleted

## 2021-05-13 ENCOUNTER — Other Ambulatory Visit: Payer: Self-pay

## 2021-05-13 DIAGNOSIS — Z87891 Personal history of nicotine dependence: Secondary | ICD-10-CM

## 2021-05-13 DIAGNOSIS — I517 Cardiomegaly: Secondary | ICD-10-CM

## 2021-05-26 ENCOUNTER — Ambulatory Visit (INDEPENDENT_AMBULATORY_CARE_PROVIDER_SITE_OTHER): Payer: Medicare Other | Admitting: Acute Care

## 2021-05-26 ENCOUNTER — Other Ambulatory Visit: Payer: Self-pay

## 2021-05-26 ENCOUNTER — Encounter: Payer: Self-pay | Admitting: Acute Care

## 2021-05-26 DIAGNOSIS — Z87891 Personal history of nicotine dependence: Secondary | ICD-10-CM

## 2021-05-26 NOTE — Patient Instructions (Signed)
Thank you for participating in the [facility] Screening [facility]. °It was our pleasure to meet you today. °We will call you with the results of your scan within the next few days. °Your scan will be assigned a Lung RADS category score by the physicians reading the scans.  °This Lung RADS score determines follow up scanning.  °See below for description of categories, and follow up screening recommendations. °We will be in touch to schedule your follow up screening annually or based on recommendations of our providers. °We will fax a copy of your scan results to your Primary Care Physician, or the physician who referred you to the program, to ensure they have the results. °Please call the office if you have any questions or concerns regarding your scanning experience or results.  °Our office number is [phone]. °Please speak with Denise Phelps, RN. She is our Lung Cancer Screening RN. °If she is unavailable when you call, please have the office staff send her a message. She will return your call at her earliest convenience. °Remember, if your scan is normal, we will scan you annually as long as you continue to meet the criteria for the program. (Age 55-77, Current smoker or smoker who has quit within the last 15 years). °If you are a smoker, remember, quitting is the single most powerful action that you can take to decrease your risk of lung cancer and other pulmonary, breathing related problems. °We know quitting is hard, and we are here to help.  °Please let us know if there is anything we can do to help you meet your goal of quitting. °If you are a former smoker, congratulations. We are proud of you! Remain smoke free! °Remember you can refer friends or family members through the number above.  °We will screen them to make sure they meet criteria for the program. °Thank you for helping us take better care of you by participating in Lung Screening. ° °You can receive free nicotine replacement therapy  ( patches, gum or mints) by calling 1-800-QUIT NOW. Please call so we can get you on the path to becoming  a non-smoker. I know it is hard, but you can do this! ° °Lung RADS Categories: ° °Lung RADS 1: no nodules or definitely non-concerning nodules.  °Recommendation is for a repeat annual scan in 12 months. ° °Lung RADS 2:  nodules that are non-concerning in appearance and behavior with a very low likelihood of becoming an active cancer. °Recommendation is for a repeat annual scan in 12 months. ° °Lung RADS 3: nodules that are probably non-concerning , includes nodules with a low likelihood of becoming an active cancer.  Recommendation is for a 6-month repeat screening scan. Often noted after an upper respiratory illness. We will be in touch to make sure you have no questions, and to schedule your 6-month scan. ° °Lung RADS 4 A: nodules with concerning findings, recommendation is most often for a follow up scan in 3 months or additional testing based on our provider's assessment of the scan. We will be in touch to make sure you have no questions and to schedule the recommended 3 month follow up scan. ° °Lung RADS 4 B:  indicates findings that are concerning. We will be in touch with you to schedule additional diagnostic testing based on our provider's  assessment of the scan. ° °Hypnosis for smoking cessation  °[employer] °[phone] ° °Acupuncture for smoking cessation  °[facility] °[phone]  °

## 2021-05-26 NOTE — Progress Notes (Signed)
Virtual Visit via Telephone Note ? ?I connected with [name] on 05/26/21 at 11:30 AM EST by telephone and verified that I am speaking with the correct person using two identifiers. ? ?Location: ?Patient:  At home ?Provider:  [facility], [place], [place], [address]  ?  ?I discussed the limitations, risks, security and privacy concerns of performing an evaluation and management service by telephone and the availability of in person appointments. I also discussed with the patient that there may be a patient responsible charge related to this service. The patient expressed understanding and agreed to proceed. ? ? ? ?Shared Decision Making Visit Lung Cancer Screening Program ?([phone]) ? ? ?Eligibility: ?Age 13 y.o. ?Pack Years Smoking History Calculation 31.5 ?(# packs/per year x # years smoked) ?Recent History of coughing up blood  no ?Unexplained weight loss? no ?( >Than 15 pounds within the last 6 months ) ?Prior History Lung / other cancer no ?(Diagnosis within the last 5 years already requiring surveillance chest CT Scans). ?Smoking Status Former Smoker ?Former Smokers: Years since quit: < 1 year ? Quit Date:  03/2021 ? ?Visit Components: ?Discussion included one or more decision making aids. yes ?Discussion included risk/benefits of screening. yes ?Discussion included potential follow up diagnostic testing for abnormal scans. yes ?Discussion included meaning and risk of over diagnosis. yes ?Discussion included meaning and risk of False Positives. yes ?Discussion included meaning of total radiation exposure. yes ? ?Counseling Included: ?Importance of adherence to annual lung cancer LDCT screening. yes ?Impact of comorbidities on ability to participate in the program. yes ?Ability and willingness to under diagnostic treatment. yes ? ?Smoking Cessation Counseling: ?Current Smokers:  ?Discussed importance of smoking cessation. yes ?Information about tobacco cessation classes and interventions provided  to patient. yes ?Patient provided with "ticket" for LDCT Scan. yes ?Symptomatic Patient. no ? Counseling NA ?Diagnosis Code: Tobacco Use Z72.0 ?Asymptomatic Patient yes ? Counseling (Intermediate counseling: > three minutes counseling) G3875 ?Former Smokers:  ?Discussed the importance of maintaining cigarette abstinence. yes ?Diagnosis Code: Personal History of Nicotine Dependence. I43.329 ?Information about tobacco cessation classes and interventions provided to patient. Yes ?Patient provided with "ticket" for LDCT Scan. yes ?Written Order for Lung Cancer Screening with LDCT placed in Epic. Yes ?(CT Chest Lung Cancer Screening Low Dose W/O CM) [id number] ?Z12.2-Screening of respiratory organs ?Z87.891-Personal history of nicotine dependence ? ? ?Magdalen Spatz, NP ?05/26/2021 ? ? ? ? ? ? ? ?

## 2021-05-27 ENCOUNTER — Ambulatory Visit
Admission: RE | Admit: 2021-05-27 | Discharge: 2021-05-27 | Disposition: A | Discharge status: Home / Self Care | Payer: Medicare Other | Source: Ambulatory Visit | Attending: Acute Care | Admitting: Acute Care

## 2021-05-27 ENCOUNTER — Ambulatory Visit: Payer: Medicare Other | Admitting: Physician Assistant

## 2021-05-27 DIAGNOSIS — Z87891 Personal history of nicotine dependence: Secondary | ICD-10-CM

## 2021-05-30 NOTE — Progress Notes (Signed)
Cardiology Office Note:    Date:  05/31/2021   ID:  Alyssa Deleon, DOB 1955-04-10, MRN [id number]  PCP:  Alyssa Lathe, PA-C  Cardiologist:  None   Referring MD: Alyssa Lathe, PA-C   Chief Complaint  Patient presents with   Shortness of Breath   Advice Only   Congestive Heart Failure    History of Present Illness:    Alyssa Deleon is a 66 y.o. female with a hx of cardiomegaly and low EF for evaluation.  Prior smoker for greater than 40 years.  Last cigarette was August 2022.  She has noted shortness of breath increasing over the past year.  She began trying to figure out why.  She ultimately had a chest CT for cancer screening performed and it revealed aortic atherosclerosis, as well as mild cardiac enlargement.  This then led to 2D Doppler echocardiography which confirmed an ejection fraction this in the mildly reduced range of 40 to 50%.  Images were personally reviewed and there may be a regional wall motion aspect of the decreased function.  The septum and inferobasal region appear to be particularly hypokinetic.  She denies episodes of chest pain.  No prior history of heart attack.  No family history of coronary disease or heart attack.  She denies orthopnea, PND, lower extremity swelling, and anginal quality pain.  Past Medical History:  Diagnosis Date   Breast cancer ([place])    Cataract, left 09/29/2017   Chronic pain syndrome, on Neurontin 09/30/2017   Dry eye syndrome of both eyes, on Restasis 09/30/2017   Emphysema lung ([place])    Family history of breast cancer    Family history of melanoma    Family history of ovarian cancer    Family history of prostate cancer    Family history of throat cancer    Frequent urinary tract infections    GAD (generalized anxiety disorder) 03/11/2015   Genital herpes    History of chicken pox    History of degenerative disc disease 10/27/2002   Knee osteonecrosis, 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   Major depression, recurrent,  chronic ([place]), followed by Psych, on Wellbutrin, Cymbalta 09/30/2017   Mixed hyperlipidemia, on Zetia and Zocor 09/30/2017   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   MVP (mitral valve prolapse) 03/13/2015   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   Nuclear sclerosis 06/10/2013   Panic disorder, followed by Psych, on Xanax 09/30/2017   Personal history of breast cancer    Psoriasis    PTSD (post-traumatic stress disorder) 03/11/2015   Stroke ([place])    Total knee replacement status, 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   aspirin EC 81 MG tablet Take 1 tablet (81 mg total) by mouth daily. Swallow whole.   buPROPion (WELLBUTRIN XL) 300 MG 24 hr tablet Take 1 tablet (300 mg total) by mouth daily.   CEQUA 0.09 % SOLN Apply 1 drop to eye 2 (two) times daily.   DULoxetine (CYMBALTA) 60 MG capsule Take 1 capsule (60 mg total) by mouth daily.   ezetimibe (ZETIA)  10 MG tablet Take 1 tablet (10 mg total) by mouth daily.   oxyCODONE-acetaminophen (PERCOCET) 10-325 MG tablet Take 1 tablet by mouth as needed.   simvastatin (ZOCOR) 40 MG tablet Take 1 tablet (40 mg total) by mouth daily.     Allergies:   Hydrocodone-acetaminophen, Klonopin [clonazepam], Crestor [rosuvastatin], Morphine sulfate, Effexor [venlafaxine], Sulfa antibiotics, and Vytorin [ezetimibe-simvastatin]   Social History   Socioeconomic History   Marital status: Divorced    Spouse name: Not on file   Number of children: 2   Years of education: Not on file   Highest education level: Not on file  Occupational History   Occupation: Retired/  disabilty  Tobacco Use   Smoking status: Former    Packs/day: 0.75    Years: 42.00    Pack years: 31.50    Types: Cigarettes    Quit date: 11/06/2020    Years since quitting: 0.5    Passive exposure: Past   Smokeless tobacco: Never  Vaping Use   Vaping Use: Never used  Substance and Sexual Activity   Alcohol use: No    Alcohol/week: 0.0 standard drinks   Drug use: Never   Sexual activity: Not Currently  Other Topics Concern   Not on file  Social History Narrative   Not on file   Social Determinants of Health   Financial Resource Strain: Not on file  Food Insecurity: Not on file  Transportation Needs: Not on file  Physical Activity: Not on file  Stress: Not on file  Social Connections: Not on file     Family History: The patient's family history includes Alzheimer's disease in her maternal grandfather and mother; Breast cancer in her maternal grandmother, mother, paternal aunt, sister, and another family member; Breast cancer (age of onset: 37) in her sister; Cancer - Other in her brother, mother, sister, and sister; Cancer - Prostate in her father; Depression in her sister; Hearing loss in her sister; Heart attack in her paternal grandfather; Hyperlipidemia in her father and sister; Miscarriages / Stillbirths in her mother; Ovarian cancer in her paternal grandmother; Prostate cancer in her paternal uncle; Skin cancer in her mother and sister; Throat cancer in her brother and father.  ROS:   Please see the history of present illness.    Prior left total knee replacement.  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 Leeroy Cha..  The CVA led to difficulty with ambulation and balance.  Starting in August she had difficulty with frequent falls and rib fractures.  She also has a history of pancreatitis August 2022 without a specific etiology being found.  All other systems reviewed and are negative.  EKGs/Labs/Other Studies Reviewed:    The following  studies were reviewed today: 2D Doppler ECHOCARDIOGRAM 05/11/2021: IMPRESSIONS     1. Left ventricular ejection fraction, by estimation, is 45 to 50%. The  left ventricle has mildly decreased function. The left ventricle  demonstrates global hypokinesis. The left ventricular internal cavity size  was mildly dilated. Left ventricular  diastolic parameters were normal.   2. Right ventricular systolic function is normal. The right ventricular  size is normal.   3. A small pericardial effusion is present. The pericardial effusion is  anterior to the right ventricle.   4. The mitral valve is normal in structure. No evidence of mitral valve  regurgitation. No evidence of mitral stenosis.   5. The aortic valve is tricuspid. Aortic valve regurgitation is not  visualized. No aortic stenosis is  present.   6. No evidence of tamponade . The inferior vena cava is normal in size  with greater than 50% respiratory variability, suggesting right atrial  pressure of 3 mmHg.   EKG:  EKG sinus tachycardia, 106 bpm.  Nonspecific T wave flattening.  Recent Labs: 10/21/2020: Hemoglobin 12.3; Platelets 206.0 10/29/2020: ALT 9; BUN 22; Creatinine, Ser 0.96; Potassium 4.2; Sodium 139  Recent Lipid Panel    Component Value Date/Time   CHOL 172 10/29/2020 1542   TRIG 68.0 10/29/2020 1542   HDL 68.10 10/29/2020 1542   CHOLHDL 3 10/29/2020 1542   VLDL 13.6 10/29/2020 1542   LDLCALC 90 10/29/2020 1542    Physical Exam:    VS:  BP 110/78    Pulse (!) 106    Ht 5' 8.5" (1.74 m)    Wt 177 lb 12.8 oz (80.6 kg)    SpO2 98%    BMI 26.64 kg/m     Wt Readings from Last 3 Encounters:  05/31/21 177 lb 12.8 oz (80.6 kg)  03/29/21 180 lb (81.6 kg)  02/25/21 182 lb 4 oz (82.7 kg)     GEN: Overweight but not obese. No acute distress HEENT: Normal NECK: No JVD. LYMPHATICS: No lymphadenopathy CARDIAC: No murmur. RRR no gallop, or edema. VASCULAR: Diminished pedal pulses bilaterally.  normal Pulses. No  bruits. RESPIRATORY:  Clear to auscultation without rales, wheezing or rhonchi  ABDOMEN: Soft, non-tender, non-distended, No pulsatile mass, MUSCULOSKELETAL: No deformity  SKIN: Warm and dry NEUROLOGIC:  Alert and oriented x 3 PSYCHIATRIC:  Normal affect   ASSESSMENT:    1. Chronic systolic HF (heart failure) ([place])   2. Shortness of breath   3. Cardiomegaly   4. MVP (mitral valve prolapse)   5. Mixed hyperlipidemia, on Zetia and Zocor   6. Aortic atherosclerosis ([place])   7. PTSD (post-traumatic stress disorder)   8. Bilateral lower extremity edema   9. Absent pedal pulses    PLAN:    In order of problems listed above:  Difficult to tell if shortness of breath is pulmonary or cardiac.  We will get a brain natruretic peptide.  She does have decreased LVEF.  She will have a myocardial perfusion imaging study done with Lexiscan stress to rule out ischemia as the basis for decreased EF.  We thought about coronary CTA but she has relatively low blood pressure and high heart rate and it is doubtful that beta-blocker therapy would be well-tolerated. BNP.  LV function will be reassessed by wall motion study associated with nuclear scintigraphy. Etiology is being elucidated.  Rule out prior infarction. No evidence of mitral valve prolapse on echo She is on Zocor and Zetia with LDL cholesterol of 90 in August.  Needs for the lipid lowering. She has absent pulses in both lower extremities in the feet.  Rule out PAD given her smoking history.  Bilateral lower extremity Doppler study is ordered. Did not discuss Not present  Overall education and awareness concerning secondary risk prevention was discussed in detail: LDL less than 70, hemoglobin A1c less than 7, blood pressure target less than 130/80 mmHg, >150 minutes of moderate aerobic activity per week, avoidance of smoking, weight control (via diet and exercise), and continued surveillance/management of/for obstructive sleep  apnea.    Medication Adjustments/Labs and Tests Ordered: Current medicines are reviewed at length with the patient today.  Concerns regarding medicines are outlined above.  Orders Placed This Encounter  Procedures   Pro b natriuretic peptide (BNP)   Cardiac  Stress Test: Informed Consent Details: Physician/Practitioner Attestation; Transcribe to consent form and obtain patient signature   MYOCARDIAL PERFUSION IMAGING   EKG 12-Lead   VAS [place] LOWER EXTREMITY ARTERIAL DUPLEX   Meds ordered this encounter  Medications   aspirin EC 81 MG tablet    Sig: Take 1 tablet (81 mg total) by mouth daily. Swallow whole.    Dispense:  30 tablet    Refill:  11    Patient Instructions  Medication Instructions:  START aspirin 81 mg by mouth daily.  *If you need a refill on your cardiac medications before your next appointment, please call your pharmacy*   Lab Work: BNP when you come for [place]  If you have labs (blood work) drawn today and your tests are completely normal, you will receive your results only by: [place] (if you have MyChart) OR A paper copy in the mail If you have any lab test that is abnormal or we need to change your treatment, we will call you to review the results.   Testing/Procedures: Your physician has requested that you have a lexiscan myoview. For further information please visit [email or web address]. Please follow instruction sheet, as given.   Your physician has requested that you have a lower or upper extremity arterial duplex. This test is an ultrasound of the arteries in the legs or arms. It looks at arterial blood flow in the legs and arms. Allow one hour for Lower and Upper Arterial scans. There are no restrictions or special instructions   Follow-Up: At [facility], you and your health needs are our priority.  As part of our continuing mission to provide you with exceptional heart care, we have created designated Provider Care Teams.  These Care  Teams include your primary Cardiologist (physician) and Advanced Practice Providers (APPs -  Physician Assistants and Nurse Practitioners) who all work together to provide you with the care you need, when you need it.  We recommend signing up for the patient portal called "MyChart".  Sign up information is provided on this After Visit Summary.  MyChart is used to connect with patients for Virtual Visits (Telemedicine).  Patients are able to view lab/test results, encounter notes, upcoming appointments, etc.  Non-urgent messages can be sent to your provider as well.   To learn more about what you can do with MyChart, go to [email or web address].    Your next appointment:   1 month(s)  The format for your next appointment:   In Person  Provider:   Illene Labrador III MD       Signed, Sinclair Grooms, MD  05/31/2021 5:14 PM    [place]

## 2021-05-31 ENCOUNTER — Ambulatory Visit: Payer: Medicare Other | Admitting: Interventional Cardiology

## 2021-05-31 ENCOUNTER — Other Ambulatory Visit: Payer: Self-pay

## 2021-05-31 ENCOUNTER — Encounter: Payer: Self-pay | Admitting: *Deleted

## 2021-05-31 ENCOUNTER — Encounter: Payer: Self-pay | Admitting: Interventional Cardiology

## 2021-05-31 VITALS — BP 110/78 | HR 106 | Ht 68.5 in | Wt 177.8 lb

## 2021-05-31 DIAGNOSIS — R600 Localized edema: Secondary | ICD-10-CM

## 2021-05-31 DIAGNOSIS — I5022 Chronic systolic (congestive) heart failure: Secondary | ICD-10-CM

## 2021-05-31 DIAGNOSIS — I517 Cardiomegaly: Secondary | ICD-10-CM

## 2021-05-31 DIAGNOSIS — R0602 Shortness of breath: Secondary | ICD-10-CM

## 2021-05-31 DIAGNOSIS — I341 Nonrheumatic mitral (valve) prolapse: Secondary | ICD-10-CM | POA: Diagnosis not present

## 2021-05-31 DIAGNOSIS — E782 Mixed hyperlipidemia: Secondary | ICD-10-CM | POA: Diagnosis not present

## 2021-05-31 DIAGNOSIS — F4310 Post-traumatic stress disorder, unspecified: Secondary | ICD-10-CM

## 2021-05-31 DIAGNOSIS — Z87891 Personal history of nicotine dependence: Secondary | ICD-10-CM

## 2021-05-31 DIAGNOSIS — I700 Atherosclerosis of aorta: Secondary | ICD-10-CM

## 2021-05-31 DIAGNOSIS — R0989 Other specified symptoms and signs involving the circulatory and respiratory systems: Secondary | ICD-10-CM

## 2021-05-31 MED ORDER — ASPIRIN EC 81 MG PO TBEC: 81.0000 mg | DELAYED_RELEASE_TABLET | Freq: Every day | ORAL | 11 refills | Status: DC

## 2021-05-31 NOTE — Patient Instructions (Addendum)
Medication Instructions:  ?START aspirin 81 mg by mouth daily.  ?*If you need a refill on your cardiac medications before your next appointment, please call your pharmacy* ? ? ?Lab Work: ?BNP when you come for Lexiscan ? ?If you have labs (blood work) drawn today and your tests are completely normal, you will receive your results only by: ?MyChart Message (if you have MyChart) OR ?A paper copy in the mail ?If you have any lab test that is abnormal or we need to change your treatment, we will call you to review the results. ? ? ?Testing/Procedures: ?Your physician has requested that you have a lexiscan myoview. For further information please visit [email or web address]. Please follow instruction sheet, as given.  ? ?Your physician has requested that you have a lower or upper extremity arterial duplex. This test is an ultrasound of the arteries in the legs or arms. It looks at arterial blood flow in the legs and arms. Allow one hour for Lower and Upper Arterial scans. There are no restrictions or special instructions ? ? ?Follow-Up: ?At [facility], you and your health needs are our priority.  As part of our continuing mission to provide you with exceptional heart care, we have created designated Provider Care Teams.  These Care Teams include your primary Cardiologist (physician) and Advanced Practice Providers (APPs -  Physician Assistants and Nurse Practitioners) who all work together to provide you with the care you need, when you need it. ? ?We recommend signing up for the patient portal called "MyChart".  Sign up information is provided on this After Visit Summary.  MyChart is used to connect with patients for Virtual Visits (Telemedicine).  Patients are able to view lab/test results, encounter notes, upcoming appointments, etc.  Non-urgent messages can be sent to your provider as well.   ?To learn more about what you can do with MyChart, go to [email or web address].   ? ?Your next appointment:   ?1  month(s) ? ?The format for your next appointment:   ?In Person ? ?Provider:   ?Illene Labrador III MD  ? ?  ?

## 2021-06-01 ENCOUNTER — Other Ambulatory Visit: Payer: Self-pay | Admitting: Interventional Cardiology

## 2021-06-01 ENCOUNTER — Telehealth (HOSPITAL_COMMUNITY): Payer: Self-pay | Admitting: *Deleted

## 2021-06-01 DIAGNOSIS — R0989 Other specified symptoms and signs involving the circulatory and respiratory systems: Secondary | ICD-10-CM

## 2021-06-01 NOTE — Telephone Encounter (Signed)
Patient given detailed instructions per Myocardial Perfusion Study Information Sheet for the test on 06/08/21 at 10:45. Patient notified to arrive 15 minutes early and that it is imperative to arrive on time for appointment to keep from having the test rescheduled. ? If you need to cancel or reschedule your appointment, please call the office within 24 hours of your appointment. . Patient verbalized understanding.Alyssa Deleon ? ? ?

## 2021-06-04 ENCOUNTER — Other Ambulatory Visit: Payer: Self-pay

## 2021-06-04 ENCOUNTER — Ambulatory Visit (HOSPITAL_COMMUNITY)
Admission: RE | Admit: 2021-06-04 | Discharge: 2021-06-04 | Disposition: A | Discharge status: Home / Self Care | Payer: Medicare Other | Source: Ambulatory Visit | Attending: Cardiovascular Disease | Admitting: Cardiovascular Disease

## 2021-06-04 DIAGNOSIS — R0989 Other specified symptoms and signs involving the circulatory and respiratory systems: Secondary | ICD-10-CM | POA: Diagnosis present

## 2021-06-08 ENCOUNTER — Other Ambulatory Visit: Payer: Medicare Other

## 2021-06-08 ENCOUNTER — Ambulatory Visit (HOSPITAL_COMMUNITY): Payer: Medicare Other | Attending: Cardiology

## 2021-06-08 ENCOUNTER — Other Ambulatory Visit: Payer: Self-pay

## 2021-06-08 DIAGNOSIS — R600 Localized edema: Secondary | ICD-10-CM

## 2021-06-08 DIAGNOSIS — I5022 Chronic systolic (congestive) heart failure: Secondary | ICD-10-CM | POA: Diagnosis present

## 2021-06-08 DIAGNOSIS — I700 Atherosclerosis of aorta: Secondary | ICD-10-CM | POA: Insufficient documentation

## 2021-06-08 DIAGNOSIS — I341 Nonrheumatic mitral (valve) prolapse: Secondary | ICD-10-CM | POA: Insufficient documentation

## 2021-06-08 DIAGNOSIS — E782 Mixed hyperlipidemia: Secondary | ICD-10-CM | POA: Diagnosis present

## 2021-06-08 DIAGNOSIS — F4310 Post-traumatic stress disorder, unspecified: Secondary | ICD-10-CM

## 2021-06-08 DIAGNOSIS — I517 Cardiomegaly: Secondary | ICD-10-CM | POA: Insufficient documentation

## 2021-06-08 LAB — MYOCARDIAL PERFUSION IMAGING
LV dias vol: 73 mL (ref 46–106)
LV sys vol: 31 mL
Nuc Stress EF: 58 %
Peak HR: 103 {beats}/min
Rest HR: 79 {beats}/min
Rest Nuclear Isotope Dose: 10.9 mCi
SDS: 2
SRS: 0
SSS: 2
ST Depression (mm): 0.5 mm
Stress Nuclear Isotope Dose: 30.5 mCi
TID: 1.03

## 2021-06-08 MED ORDER — REGADENOSON 0.4 MG/5ML IV SOLN
0.4000 mg | Freq: Once | INTRAVENOUS | Status: AC
  Administered 2021-06-08: 0.4 mg via INTRAVENOUS

## 2021-06-08 MED ORDER — TECHNETIUM TC 99M TETROFOSMIN IV KIT
30.5000 | PACK | Freq: Once | INTRAVENOUS | Status: AC | PRN
  Administered 2021-06-08: 30.5 via INTRAVENOUS
  Filled 2021-06-08: qty 31

## 2021-06-08 MED ORDER — TECHNETIUM TC 99M TETROFOSMIN IV KIT
10.9000 | PACK | Freq: Once | INTRAVENOUS | Status: AC | PRN
  Administered 2021-06-08: 10.9 via INTRAVENOUS
  Filled 2021-06-08: qty 11

## 2021-06-09 LAB — PRO B NATRIURETIC PEPTIDE: NT-Pro BNP: 52 pg/mL (ref 0–301)

## 2021-06-10 ENCOUNTER — Ambulatory Visit: Payer: Medicare Other

## 2021-06-11 ENCOUNTER — Ambulatory Visit: Payer: Medicare Other | Admitting: Physician Assistant

## 2021-06-17 ENCOUNTER — Other Ambulatory Visit: Payer: Self-pay

## 2021-06-17 ENCOUNTER — Ambulatory Visit (INDEPENDENT_AMBULATORY_CARE_PROVIDER_SITE_OTHER): Payer: Medicare Other

## 2021-06-17 DIAGNOSIS — Z0000 Encounter for general adult medical examination without abnormal findings: Secondary | ICD-10-CM | POA: Diagnosis not present

## 2021-06-17 NOTE — Progress Notes (Addendum)
Virtual Visit via Telephone Note ? ?I connected with  Alyssa Deleon on 06/17/21 at  2:00 PM EDT by telephone and verified that I am speaking with the correct person using two identifiers. ? ?Medicare Annual Wellness visit completed telephonically due to Covid-19 pandemic.  ? ?Persons participating in this call: This Health Coach and this patient.  ? ?Location: ?Patient: Home ?Provider: Office ?  ?I discussed the limitations, risks, security and privacy concerns of performing an evaluation and management service by telephone and the availability of in person appointments. The patient expressed understanding and agreed to proceed. ? ?Unable to perform video visit due to video visit attempted and failed and/or patient does not have video capability.  ? ?Some vital signs may be absent or patient reported.  ? ?Alyssa Brace, LPN ? ? ?Subjective:  ? Alyssa Deleon is a 66 y.o. female who presents for Medicare Annual (Subsequent) preventive examination. ? ?Review of Systems    ? ?Cardiac Risk Factors include: advanced age (>19mn, >>62women);dyslipidemia ? ?   ?Objective:  ?  ?There were no vitals filed for this visit. ?There is no height or weight on file to calculate BMI. ? ? ?  06/17/2021  ?  2:03 PM 03/29/2021  ? 11:43 AM 01/29/2021  ?  2:02 PM 12/14/2020  ?  8:43 PM 11/14/2019  ? 11:33 AM  ?Advanced Directives  ?Does Patient Have a Medical Advance Directive? No No No No Yes  ?Type of AProduction managerof ACoveloLiving will  ?Does patient want to make changes to medical advance directive? No - Patient declined      ?Copy of [place] Chart?     No - copy requested  ?Would patient like information on creating a medical advance directive?  No - Patient declined  No - Patient declined   ? ? ?Current Medications (verified) ?Outpatient Encounter Medications as of 06/17/2021  ?Medication Sig  ? ALPRAZolam (XANAX) 1 MG tablet Take 2 tablets (2 mg total) by mouth at bedtime as  needed for anxiety.  ? aspirin EC 81 MG tablet Take 1 tablet (81 mg total) by mouth daily. Swallow whole.  ? buPROPion (WELLBUTRIN XL) 300 MG 24 hr tablet Take 1 tablet (300 mg total) by mouth daily.  ? CEQUA 0.09 % SOLN Apply 1 drop to eye 2 (two) times daily.  ? DULoxetine (CYMBALTA) 60 MG capsule Take 1 capsule (60 mg total) by mouth daily.  ? ezetimibe (ZETIA) 10 MG tablet Take 1 tablet (10 mg total) by mouth daily.  ? simvastatin (ZOCOR) 40 MG tablet Take 1 tablet (40 mg total) by mouth daily.  ? gabapentin (NEURONTIN) 300 MG capsule Take 1 capsule (300 mg total) by mouth 2 (two) times daily.  ? [DISCONTINUED] methocarbamol (ROBAXIN) 500 MG tablet Take 1 tablet (500 mg total) by mouth 4 (four) times daily. (Patient not taking: Reported on 05/31/2021)  ? [DISCONTINUED] naproxen (NAPROSYN) 500 MG tablet Take 1 tablet (500 mg total) by mouth 2 (two) times daily. (Patient not taking: Reported on 05/31/2021)  ? [DISCONTINUED] oxyCODONE-acetaminophen (PERCOCET) 10-325 MG tablet Take 1 tablet by mouth as needed.  ? [DISCONTINUED] pantoprazole (PROTONIX) 40 MG tablet TAKE 1 TABLET BY MOUTH EVERY DAY (Patient not taking: Reported on 05/31/2021)  ? ?No facility-administered encounter medications on file as of 06/17/2021.  ? ? ?Allergies (verified) ?Hydrocodone-acetaminophen, Klonopin [clonazepam], Crestor [rosuvastatin], Morphine sulfate, Effexor [venlafaxine], Seasonal ic [cholestatin], Sulfa antibiotics, and Vytorin [ezetimibe-simvastatin]  ? ?  History: ?Past Medical History:  ?Diagnosis Date  ? Breast cancer ([place])   ? Cataract, left 09/29/2017  ? Chronic pain syndrome, on Neurontin 09/30/2017  ? Dry eye syndrome of both eyes, on Restasis 09/30/2017  ? Emphysema lung ([place])   ? Family history of breast cancer   ? Family history of melanoma   ? Family history of ovarian cancer   ? Family history of prostate cancer   ? Family history of throat cancer   ? Frequent urinary tract infections   ? GAD (generalized anxiety disorder)  03/11/2015  ? Genital herpes   ? History of chicken pox   ? History of degenerative disc disease 10/27/2002  ? 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 Major depression, recurrent, chronic ([place]), followed by Psych, on Wellbutrin, Cymbalta 09/30/2017  ? Mixed hyperlipidemia, on Zetia and Zocor 09/30/2017  ? 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 MVP (mitral valve prolapse) 03/13/2015  ? 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 Panic disorder, followed by Psych, on Xanax 09/30/2017  ? Personal history of breast cancer   ? Psoriasis   ? PTSD (post-traumatic stress disorder) 03/11/2015  ? Stroke [facility])   ? 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 Vitamin D deficiency 09/30/2017  ? ?Past Surgical History:  ?Procedure Laterality Date  ? AUGMENTATION MAMMAPLASTY Bilateral   ? BREAST BIOPSY    ? MASTECTOMY Left 1995  ? has a tram flap  ? RECONSTRUCTION BREAST W/ TRAM FLAP Left 2003  ? ?Family History  ?Problem Relation Age of Onset  ? Cancer - Other Mother   ? Miscarriages / [place] Mother   ? Alzheimer's disease Mother   ? Breast cancer Mother   ? Skin cancer Mother   ? Cancer - Prostate Father   ?     metastatic  ? Hyperlipidemia Father   ? Throat cancer Father   ? Cancer - Other Sister   ? Depression Sister   ? Hearing loss Sister   ? Hyperlipidemia Sister   ? Breast cancer Sister   ?     both breasts  ? Skin cancer Sister   ? Cancer - Other Brother   ? Throat cancer Brother   ? Cancer - Other Sister   ? Breast cancer Sister 82  ? Breast cancer Maternal Grandmother   ? Alzheimer's disease Maternal Grandfather   ? Ovarian cancer Paternal Grandmother   ? Heart attack Paternal Grandfather   ? Breast cancer Paternal Aunt   ? Prostate cancer Paternal Uncle   ? Breast cancer Other   ?     both breasts dx 63  ? ?Social History  ? ?Socioeconomic History  ? Marital status: Divorced  ?  Spouse name: Not on file  ? Number of children: 2  ? Years of education: Not on  file  ? Highest education level: Not on file  ?Occupational History  ? Occupation: Retired/ disabilty  ?Tobacco Use  ? Smoking status: Former  ?  Packs/day: 0.75  ?  Years: 42.00  ?  Pack years: 31.50  ?  Types: Cigarettes  ?  Quit date: 11/06/2020  ?  Years since quitting: 0.6  ?  Passive exposure: Past  ? Smokeless tobacco: Never  ?Vaping Use  ? Vaping Use: Never used  ?Substance and Sexual Activity  ? Alcohol use: No  ?  Alcohol/week: 0.0 standard drinks  ? Drug use: Never  ? Sexual activity: Not Currently  ?  Other Topics Concern  ? Not on file  ?Social History Narrative  ? Not on file  ? ?Social Determinants of Health  ? ?Financial Resource Strain: Low Risk   ? Difficulty of Paying Living Expenses: Not hard at all  ?Food Insecurity: No Food Insecurity  ? Worried About Charity fundraiser in the Last Year: Never true  ? Ran Out of Food in the Last Year: Never true  ?Transportation Needs: No Transportation Needs  ? Lack of Transportation (Medical): No  ? Lack of Transportation (Non-Medical): No  ?Physical Activity: Inactive  ? Days of Exercise per Week: 0 days  ? Minutes of Exercise per Session: 0 min  ?Stress: No Stress Concern Present  ? Feeling of Stress : Not at all  ?Social Connections: Socially Isolated  ? Frequency of Communication with Friends and Family: More than three times a week  ? Frequency of Social Gatherings with Friends and Family: Once a week  ? Attends Religious Services: Never  ? Active Member of Clubs or Organizations: No  ? Attends Archivist Meetings: Never  ? Marital Status: Divorced  ? ? ?Tobacco Counseling ?Counseling given: Not Answered ? ? ?Clinical Intake: ? ?Pre-visit preparation completed: Yes ? ?Pain : No/denies pain ? ?  ? ?BMI - recorded: 26.52 ?Nutritional Status: BMI 25 -29 Overweight ?Nutritional Risks: None ?Diabetes: No ? ?How often do you need to have someone help you when you read instructions, pamphlets, or other written materials from your doctor or pharmacy?: 1  - Never ? ?Diabetic?no ? ?Interpreter Needed?: No ? ?Information entered by :: Charlott Rakes, LPN ? ? ?Activities of Daily Living ? ?  06/17/2021  ?  2:06 PM  ?In your present state of health, do you have

## 2021-06-17 NOTE — Patient Instructions (Addendum)
Alyssa Deleon , ?Thank you for taking time to come for your Medicare Wellness Visit. I appreciate your ongoing commitment to your health goals. Please review the following plan we discussed and let me know if I can assist you in the future.  ? ?Screening recommendations/referrals: ?Colonoscopy: Cologuard 06/23/20 ?Mammogram: pt  will make appt  ?Bone Density: scheduled for 08/31/21 ?Recommended yearly ophthalmology/optometry visit for glaucoma screening and checkup ?Recommended yearly dental visit for hygiene and checkup ? ?Vaccinations: ?Influenza vaccine: Done 01/05/21 repeat every  year  ?Pneumococcal vaccine: due  ?Tdap vaccine: Done 09/27/17 repeat every 10 years  ?Shingles vaccine: Shingrix discussed. Please contact your pharmacy for coverage information.    ?Covid-19:Completed 2/20, 3/16, 12/26/19 & 02/26/21 ? ?Advanced directives: Advance directive discussed with you today. Even though you declined this today please call our office should you change your mind and we can give you the proper paperwork for you to fill out. ? ? ?[facility] ?Next appointment: Follow up in one year for your annual wellness visit  ? ? ?Preventive Care 68 Years and Older, Female ?Preventive care refers to lifestyle choices and visits with your health care provider that can promote health and wellness. ?What does preventive care include? ?A yearly physical exam. This is also called an annual well check. ?Dental exams once or twice a year. ?Routine eye exams. Ask your health care provider how often you should have your eyes checked. ?Personal lifestyle choices, including: ?Daily care of your teeth and gums. ?Regular physical activity. ?Eating a healthy diet. ?Avoiding tobacco and drug use. ?Limiting alcohol use. ?Practicing safe sex. ?Taking low-dose aspirin every day. ?Taking vitamin and mineral supplements as recommended by your health care provider. ?What happens during an annual well check? ?The  services and screenings done by your health care provider during your annual well check will depend on your age, overall health, lifestyle risk factors, and family history of disease. ?Counseling  ?Your health care provider may ask you questions about your: ?Alcohol use. ?Tobacco use. ?Drug use. ?Emotional well-being. ?Home and relationship well-being. ?Sexual activity. ?Eating habits. ?History of falls. ?Memory and ability to understand (cognition). ?Work and work Statistician. ?Reproductive health. ?Screening  ?You may have the following tests or measurements: ?Height, weight, and BMI. ?Blood pressure. ?Lipid and cholesterol levels. These may be checked every 5 years, or more frequently if you are over 37 years old. ?Skin check. ?Lung cancer screening. You may have this screening every year starting at age 58 if you have a 30-pack-year history of smoking and currently smoke or have quit within the past 15 years. ?Fecal occult blood test (FOBT) of the stool. You may have this test every year starting at age 3. ?Flexible sigmoidoscopy or colonoscopy. You may have a sigmoidoscopy every 5 years or a colonoscopy every 10 years starting at age 40. ?Hepatitis C blood test. ?Hepatitis B blood test. ?Sexually transmitted disease (STD) testing. ?Diabetes screening. This is done by checking your blood sugar (glucose) after you have not eaten for a while (fasting). You may have this done every 1-3 years. ?Bone density scan. This is done to screen for osteoporosis. You may have this done starting at age 49. ?Mammogram. This may be done every 1-2 years. Talk to your health care provider about how often you should have regular mammograms. ?Talk with your health care provider about your test results, treatment options, and if necessary, the need for more tests. ?Vaccines  ?Your health care provider may  recommend certain vaccines, such as: ?Influenza vaccine. This is recommended every year. ?Tetanus, diphtheria, and acellular  pertussis (Tdap, Td) vaccine. You may need a Td booster every 10 years. ?Zoster vaccine. You may need this after age 35. ?Pneumococcal 13-valent conjugate (PCV13) vaccine. One dose is recommended after age 47. ?Pneumococcal polysaccharide (PPSV23) vaccine. One dose is recommended after age 59. ?Talk to your health care provider about which screenings and vaccines you need and how often you need them. ?This information is not intended to replace advice given to you by your health care provider. Make sure you discuss any questions you have with your health care provider. ?Document Released: 04/10/2015 Document Revised: 12/02/2015 Document Reviewed: 01/13/2015 ?Elsevier Interactive Patient Education ? 2017 [employer]. ? ?Fall Prevention in the Home ?Falls can cause injuries. They can happen to people of all ages. There are many things you can do to make your home safe and to help prevent falls. ?What can I do on the outside of my home? ?Regularly fix the edges of walkways and driveways and fix any cracks. ?Remove anything that might make you trip as you walk through a door, such as a raised step or threshold. ?Trim any bushes or trees on the path to your home. ?Use bright outdoor lighting. ?Clear any walking paths of anything that might make someone trip, such as rocks or tools. ?Regularly check to see if handrails are loose or broken. Make sure that both sides of any steps have handrails. ?Any raised decks and porches should have guardrails on the edges. ?Have any leaves, snow, or ice cleared regularly. ?Use sand or salt on walking paths during winter. ?Clean up any spills in your garage right away. This includes oil or grease spills. ?What can I do in the bathroom? ?Use night lights. ?Install grab bars by the toilet and in the tub and shower. Do not use towel bars as grab bars. ?Use non-skid mats or decals in the tub or shower. ?If you need to sit down in the shower, use a plastic, non-slip stool. ?Keep the floor  dry. Clean up any water that spills on the floor as soon as it happens. ?Remove soap buildup in the tub or shower regularly. ?Attach bath mats securely with double-sided non-slip rug tape. ?Do not have throw rugs and other things on the floor that can make you trip. ?What can I do in the bedroom? ?Use night lights. ?Make sure that you have a light by your bed that is easy to reach. ?Do not use any sheets or blankets that are too big for your bed. They should not hang down onto the floor. ?Have a firm chair that has side arms. You can use this for support while you get dressed. ?Do not have throw rugs and other things on the floor that can make you trip. ?What can I do in the kitchen? ?Clean up any spills right away. ?Avoid walking on wet floors. ?Keep items that you use a lot in easy-to-reach places. ?If you need to reach something above you, use a strong step stool that has a grab bar. ?Keep electrical cords out of the way. ?Do not use floor polish or wax that makes floors slippery. If you must use wax, use non-skid floor wax. ?Do not have throw rugs and other things on the floor that can make you trip. ?What can I do with my stairs? ?Do not leave any items on the stairs. ?Make sure that there are handrails on both sides of  the stairs and use them. Fix handrails that are broken or loose. Make sure that handrails are as long as the stairways. ?Check any carpeting to make sure that it is firmly attached to the stairs. Fix any carpet that is loose or worn. ?Avoid having throw rugs at the top or bottom of the stairs. If you do have throw rugs, attach them to the floor with carpet tape. ?Make sure that you have a light switch at the top of the stairs and the bottom of the stairs. If you do not have them, ask someone to add them for you. ?What else can I do to help prevent falls? ?Wear shoes that: ?Do not have high heels. ?Have rubber bottoms. ?Are comfortable and fit you well. ?Are closed at the toe. Do not wear  sandals. ?If you use a stepladder: ?Make sure that it is fully opened. Do not climb a closed stepladder. ?Make sure that both sides of the stepladder are locked into place. ?Ask someone to hold it for you, if possib

## 2021-06-23 ENCOUNTER — Encounter: Payer: Self-pay | Admitting: Physician Assistant

## 2021-06-23 ENCOUNTER — Encounter: Payer: Self-pay | Admitting: Interventional Cardiology

## 2021-06-30 ENCOUNTER — Ambulatory Visit: Payer: Medicare Other | Admitting: Interventional Cardiology

## 2021-07-05 ENCOUNTER — Encounter: Payer: Self-pay | Admitting: Physician Assistant

## 2021-07-07 ENCOUNTER — Ambulatory Visit: Payer: Medicare Other | Admitting: Physician Assistant

## 2021-08-01 ENCOUNTER — Emergency Department (HOSPITAL_BASED_OUTPATIENT_CLINIC_OR_DEPARTMENT_OTHER)
Admission: EM | Admit: 2021-08-01 | Discharge: 2021-08-01 | Disposition: A | Discharge status: Home / Self Care | Payer: Medicare Other | Attending: Emergency Medicine | Admitting: Emergency Medicine

## 2021-08-01 ENCOUNTER — Emergency Department (HOSPITAL_BASED_OUTPATIENT_CLINIC_OR_DEPARTMENT_OTHER): Payer: Medicare Other

## 2021-08-01 ENCOUNTER — Other Ambulatory Visit: Payer: Self-pay

## 2021-08-01 ENCOUNTER — Encounter (HOSPITAL_BASED_OUTPATIENT_CLINIC_OR_DEPARTMENT_OTHER): Payer: Self-pay | Admitting: Emergency Medicine

## 2021-08-01 DIAGNOSIS — Z7982 Long term (current) use of aspirin: Secondary | ICD-10-CM | POA: Insufficient documentation

## 2021-08-01 DIAGNOSIS — N202 Calculus of kidney with calculus of ureter: Secondary | ICD-10-CM | POA: Diagnosis not present

## 2021-08-01 DIAGNOSIS — W010XXA Fall on same level from slipping, tripping and stumbling without subsequent striking against object, initial encounter: Secondary | ICD-10-CM | POA: Diagnosis not present

## 2021-08-01 DIAGNOSIS — S32028A Other fracture of second lumbar vertebra, initial encounter for closed fracture: Secondary | ICD-10-CM | POA: Insufficient documentation

## 2021-08-01 DIAGNOSIS — N390 Urinary tract infection, site not specified: Secondary | ICD-10-CM | POA: Diagnosis not present

## 2021-08-01 DIAGNOSIS — S22088A Other fracture of T11-T12 vertebra, initial encounter for closed fracture: Secondary | ICD-10-CM | POA: Insufficient documentation

## 2021-08-01 DIAGNOSIS — S3992XA Unspecified injury of lower back, initial encounter: Secondary | ICD-10-CM | POA: Diagnosis present

## 2021-08-01 DIAGNOSIS — Y92009 Unspecified place in unspecified non-institutional (private) residence as the place of occurrence of the external cause: Secondary | ICD-10-CM | POA: Diagnosis not present

## 2021-08-01 DIAGNOSIS — S32000A Wedge compression fracture of unspecified lumbar vertebra, initial encounter for closed fracture: Secondary | ICD-10-CM

## 2021-08-01 DIAGNOSIS — N200 Calculus of kidney: Secondary | ICD-10-CM

## 2021-08-01 LAB — CBC WITH DIFFERENTIAL/PLATELET
Abs Immature Granulocytes: 0.01 10*3/uL (ref 0.00–0.07)
Basophils Absolute: 0 10*3/uL (ref 0.0–0.1)
Basophils Relative: 0 %
Eosinophils Absolute: 0.1 10*3/uL (ref 0.0–0.5)
Eosinophils Relative: 2 %
HCT: 40 % (ref 36.0–46.0)
Hemoglobin: 13 g/dL (ref 12.0–15.0)
Immature Granulocytes: 0 %
Lymphocytes Relative: 24 %
Lymphs Abs: 1.7 10*3/uL (ref 0.7–4.0)
MCH: 30.7 pg (ref 26.0–34.0)
MCHC: 32.5 g/dL (ref 30.0–36.0)
MCV: 94.3 fL (ref 80.0–100.0)
Monocytes Absolute: 0.5 10*3/uL (ref 0.1–1.0)
Monocytes Relative: 7 %
Neutro Abs: 4.9 10*3/uL (ref 1.7–7.7)
Neutrophils Relative %: 67 %
Platelets: 253 10*3/uL (ref 150–400)
RBC: 4.24 MIL/uL (ref 3.87–5.11)
RDW: 12.6 % (ref 11.5–15.5)
WBC: 7.3 10*3/uL (ref 4.0–10.5)
nRBC: 0 % (ref 0.0–0.2)

## 2021-08-01 LAB — COMPREHENSIVE METABOLIC PANEL
ALT: 7 U/L (ref 0–44)
AST: 10 U/L — ABNORMAL LOW (ref 15–41)
Albumin: 4.6 g/dL (ref 3.5–5.0)
Alkaline Phosphatase: 84 U/L (ref 38–126)
Anion gap: 8 (ref 5–15)
BUN: 20 mg/dL (ref 8–23)
CO2: 31 mmol/L (ref 22–32)
Calcium: 9.7 mg/dL (ref 8.9–10.3)
Chloride: 101 mmol/L (ref 98–111)
Creatinine, Ser: 0.87 mg/dL (ref 0.44–1.00)
GFR, Estimated: >60 mL/min (ref >60)
Glucose, Bld: 92 mg/dL (ref 70–99)
Potassium: 3.7 mmol/L (ref 3.5–5.1)
Sodium: 140 mmol/L (ref 135–145)
Total Bilirubin: 0.7 mg/dL (ref 0.3–1.2)
Total Protein: 7.4 g/dL (ref 6.5–8.1)

## 2021-08-01 LAB — URINALYSIS, ROUTINE W REFLEX MICROSCOPIC
Bilirubin Urine: NEGATIVE
Glucose, UA: NEGATIVE mg/dL
Ketones, ur: NEGATIVE mg/dL
Nitrite: NEGATIVE
Protein, ur: 30 mg/dL — AB
RBC / HPF: >50 RBC/hpf — ABNORMAL HIGH (ref 0–5)
Specific Gravity, Urine: 1.024 (ref 1.005–1.030)
pH: 5.5 (ref 5.0–8.0)

## 2021-08-01 MED ORDER — OXYCODONE-ACETAMINOPHEN 5-325 MG PO TABS
2.0000 | ORAL_TABLET | Freq: Once | ORAL | Status: AC
  Administered 2021-08-01: 2 via ORAL
  Filled 2021-08-01: qty 2

## 2021-08-01 MED ORDER — SODIUM CHLORIDE 0.9 % IV SOLN
1.0000 g | Freq: Once | INTRAVENOUS | Status: AC
  Administered 2021-08-01: 1 g via INTRAVENOUS
  Filled 2021-08-01: qty 10

## 2021-08-01 MED ORDER — CEFPODOXIME PROXETIL 200 MG PO TABS: 200.0000 mg | ORAL_TABLET | Freq: Two times a day (BID) | ORAL | 0 refills | Status: AC

## 2021-08-01 MED ORDER — IOHEXOL 300 MG/ML  SOLN
100.0000 mL | Freq: Once | INTRAMUSCULAR | Status: AC | PRN
  Administered 2021-08-01: 100 mL via INTRAVENOUS

## 2021-08-01 NOTE — ED Provider Notes (Signed)
?[facility] ?Provider Note ? ? ?CSN: [id number] ?Arrival date & time: 08/01/21  1633 ? ?  ? ?History ? ?Chief Complaint  ?Patient presents with  ? Fall  ? ? ?Alyssa Deleon is a 66 y.o. female. ? ?Patient presents with chief complaint of a fall 4 days ago with persistent back pain since then.  She states that she tripped on a protruding structure at her home.  Also states that when she urinated she noticed a drop of blood when she was wiping her urethra.  Otherwise denies any fevers denies vomiting denies cough denies diarrhea.  Denies head injury or loss of consciousness. ? ? ?  ? ?Home Medications ?Prior to Admission medications   ?Medication Sig Start Date End Date Taking? Authorizing Provider  ?cefpodoxime (VANTIN) 200 MG tablet Take 1 tablet (200 mg total) by mouth 2 (two) times daily for 10 days. 08/01/21 08/11/21 Yes Luna Fuse, MD  ?oxyCODONE-acetaminophen (PERCOCET) 10-325 MG tablet Take 1 tablet by mouth every 4 (four) hours as needed for pain.   Yes [provider]  ?ALPRAZolam Duanne Moron) 1 MG tablet Take 2 tablets (2 mg total) by mouth at bedtime as needed for anxiety. 02/25/21   Allwardt, Randa Evens, PA-C  ?aspirin EC 81 MG tablet Take 1 tablet (81 mg total) by mouth daily. Swallow whole. 05/31/21   Belva Crome, MD  ?buPROPion (WELLBUTRIN XL) 300 MG 24 hr tablet Take 1 tablet (300 mg total) by mouth daily. 02/25/21   Allwardt, Randa Evens, PA-C  ?CEQUA 0.09 % SOLN Apply 1 drop to eye 2 (two) times daily. 05/10/21   [provider]  ?DULoxetine (CYMBALTA) 60 MG capsule Take 1 capsule (60 mg total) by mouth daily. 02/25/21   Allwardt, Randa Evens, PA-C  ?ezetimibe (ZETIA) 10 MG tablet Take 1 tablet (10 mg total) by mouth daily. 02/25/21   Allwardt, Randa Evens, PA-C  ?gabapentin (NEURONTIN) 300 MG capsule Take 1 capsule (300 mg total) by mouth 2 (two) times daily. 02/25/21 05/26/21  Allwardt, Randa Evens, PA-C  ?simvastatin (ZOCOR) 40 MG tablet Take 1 tablet (40 mg total) by mouth  daily. 02/25/21   Allwardt, Randa Evens, PA-C  ?   ? ?Allergies    ?Hydrocodone-acetaminophen, Klonopin [clonazepam], Crestor [rosuvastatin], Morphine sulfate, Effexor [venlafaxine], Seasonal ic [cholestatin], Sulfa antibiotics, and Vytorin [ezetimibe-simvastatin]   ? ?Review of Systems   ?Review of Systems  ?Constitutional:  Negative for fever.  ?HENT:  Negative for ear pain.   ?Eyes:  Negative for pain.  ?Respiratory:  Negative for cough.   ?Cardiovascular:  Negative for chest pain.  ?Gastrointestinal:  Negative for abdominal pain.  ?Genitourinary:  Negative for flank pain.  ?Musculoskeletal:  Positive for back pain.  ?Skin:  Negative for rash.  ?Neurological:  Negative for headaches.  ? ?Physical Exam ?Updated Vital Signs ?BP 121/78   Pulse 74   Temp 97.7 ?F (36.5 ?C) (Tympanic)   Resp 18   SpO2 98%  ?Physical Exam ?Constitutional:   ?   General: She is not in acute distress. ?   Appearance: Normal appearance.  ?HENT:  ?   Head: Normocephalic.  ?   Nose: Nose normal.  ?Eyes:  ?   Extraocular Movements: Extraocular movements intact.  ?Cardiovascular:  ?   Rate and Rhythm: Normal rate.  ?Pulmonary:  ?   Effort: Pulmonary effort is normal.  ?Abdominal:  ?   Tenderness: There is no right CVA tenderness or left CVA tenderness.  ?Musculoskeletal:     ?  General: Normal range of motion.  ?   Cervical back: Normal range of motion.  ?   Comments: On exam patient has no C-or T-spine tenderness.  Mild L1-L3 midline tenderness noted.  No CVA tenderness noted.  No pain with range of motion of bilateral shoulders elbows wrists or hips knees or ankles.  ?Neurological:  ?   General: No focal deficit present.  ?   Mental Status: She is alert. Mental status is at baseline.  ? ? ?ED Results / Procedures / Treatments   ?Labs ?(all labs ordered are listed, but only abnormal results are displayed) ?Labs Reviewed  ?URINALYSIS, ROUTINE W REFLEX MICROSCOPIC - Abnormal; Notable for the following components:  ?    Result Value  ? Color,  Urine ORANGE (*)   ? APPearance CLOUDY (*)   ? Hgb urine dipstick LARGE (*)   ? Protein, ur 30 (*)   ? Leukocytes,Ua SMALL (*)   ? RBC / HPF >50 (*)   ? Bacteria, UA MANY (*)   ? All other components within normal limits  ?COMPREHENSIVE METABOLIC PANEL - Abnormal; Notable for the following components:  ? AST 10 (*)   ? All other components within normal limits  ?CBC WITH DIFFERENTIAL/PLATELET  ? ? ?EKG ?None ? ?Radiology ?CT ABDOMEN PELVIS W CONTRAST ? ?Result Date: 08/01/2021 ?CLINICAL DATA:  Abdominal trauma, blunt R flank pain after fall EXAM: CT ABDOMEN AND PELVIS WITH CONTRAST TECHNIQUE: Multidetector CT imaging of the abdomen and pelvis was performed using the standard protocol following bolus administration of intravenous contrast. RADIATION DOSE REDUCTION: This exam was performed according to the departmental dose-optimization program which includes automated exposure control, adjustment of the mA and/or kV according to patient size and/or use of iterative reconstruction technique. CONTRAST:  172m OMNIPAQUE IOHEXOL 300 MG/ML  SOLN COMPARISON:  10/21/2020 FINDINGS: Lower chest: No acute abnormality Hepatobiliary: No focal hepatic abnormality. Gallbladder unremarkable. Pancreas: No focal abnormality or ductal dilatation. Spleen: No focal abnormality.  Normal size. Adrenals/Urinary Tract: Left UPJ stone measuring 4 mm. No hydronephrosis. Adrenal glands unremarkable. Urinary bladder unremarkable. Stomach/Bowel: Moderate stool burden throughout the colon. Stomach, large and small bowel grossly unremarkable. Vascular/Lymphatic: Aortic atherosclerosis. No evidence of aneurysm or adenopathy. Reproductive: Uterus and adnexa unremarkable.  No mass. Other: No free fluid or free air. Musculoskeletal: Mild compression fractures through the superior endplates of TZ16and L2, age indeterminate but new since prior study. IMPRESSION: 4 mm left UPJ stone.  No hydronephrosis. Mild compression fracture through the superior  endplates of TR67and L2, age indeterminate but new since July 2022. Aortic atherosclerosis. Electronically Signed   By: KRolm BaptiseM.D.   On: 08/01/2021 21:12   ? ?Procedures ?Procedures  ? ? ?Medications Ordered in ED ?Medications  ?cefTRIAXone (ROCEPHIN) 1 g in sodium chloride 0.9 % 100 mL IVPB (1 g Intravenous New Bag/Given 08/01/21 2134)  ?iohexol (OMNIPAQUE) 300 MG/ML solution 100 mL (100 mLs Intravenous Contrast Given 08/01/21 2045)  ?oxyCODONE-acetaminophen (PERCOCET/ROXICET) 5-325 MG per tablet 2 tablet (2 tablets Oral Given 08/01/21 2113)  ? ? ?ED Course/ Medical Decision Making/ A&P ?  ?                        ?Medical Decision Making ?Amount and/or Complexity of Data Reviewed ?Labs: ordered. ?Radiology: ordered. ? ?Risk ?Prescription drug management. ? ? ?Cardiac monitoring showing sinus rhythm. ? ?Review of record shows encounter in March 2023 for wellness exam. ? ?Work-up today included labs White count  is normal at 7 hemoglobin normal chemistry normal.  Urinalysis however positive for UTI with many bacteria and clumps of WBCs.  CT abdomen pelvis pursued showing age-indeterminate compression fractures which appear mild at T12 and L2.  Also noted was a 4 mm kidney stone on the left side. ? ?However the patient denies any left flank pain.  She denies any fevers at home. ? ?Given UTI and kidney stone, consultation with urology on-call.  Agree with antibiotics and outpatient follow-up with urology.  Instructed to return immediately back to the ER for any fevers or worsening symptoms.  Otherwise follow-up with urology this week. ? ?Patient appears comfortable otherwise on exam.  She is ambulatory here without assistance.  Discharged home in stable condition. ? ? ? ? ? ? ? ?Final Clinical Impression(s) / ED Diagnoses ?Final diagnoses:  ?Compression fracture of lumbar vertebra, unspecified lumbar vertebral level, initial encounter ([place])  ?Kidney stone  ?Urinary tract infection with hematuria, site unspecified   ? ? ?Rx / DC Orders ?ED Discharge Orders   ? ?      Ordered  ?  cefpodoxime (VANTIN) 200 MG tablet  2 times daily       ? 08/01/21 2159  ? ?  ?  ? ?  ? ? ?  ?Luna Fuse, MD ?08/01/21 2200 ? ?

## 2021-08-01 NOTE — Discharge Instructions (Addendum)
Call the urologist tomorrow to schedule an appointment. ? ?Return immediately if you have fevers or worsening pain. ? ?

## 2021-08-01 NOTE — ED Triage Notes (Addendum)
Pt arrives with [facility] after a fall 4 days ago.   ?Vitals en route ?140/82, 100hr, CBG 103, 96% RA ? ? ? ?Pt c/o bilateral back pain, worse on the right and blood in her urine since yesterday.  Pt AAOx4 in triage, in NAD. ?

## 2021-08-01 NOTE — ED Notes (Signed)
Patient transported to CT 

## 2021-08-24 ENCOUNTER — Telehealth: Payer: Self-pay

## 2021-08-24 NOTE — Telephone Encounter (Signed)
Patient states Parking placard that was mailed to [facility] was not signed by [place].  States DMV mailed it back to her.  She is going to mail it to our office for [place] to sign and mail back to her.

## 2021-08-30 ENCOUNTER — Ambulatory Visit: Payer: Medicare Other | Admitting: Dermatology

## 2021-08-30 DIAGNOSIS — L729 Follicular cyst of the skin and subcutaneous tissue, unspecified: Secondary | ICD-10-CM

## 2021-08-30 DIAGNOSIS — L570 Actinic keratosis: Secondary | ICD-10-CM

## 2021-08-30 DIAGNOSIS — L720 Epidermal cyst: Secondary | ICD-10-CM | POA: Diagnosis not present

## 2021-08-30 DIAGNOSIS — Z1283 Encounter for screening for malignant neoplasm of skin: Secondary | ICD-10-CM | POA: Diagnosis not present

## 2021-08-30 DIAGNOSIS — D1801 Hemangioma of skin and subcutaneous tissue: Secondary | ICD-10-CM | POA: Diagnosis not present

## 2021-08-31 ENCOUNTER — Ambulatory Visit
Admission: RE | Admit: 2021-08-31 | Discharge: 2021-08-31 | Disposition: A | Discharge status: Home / Self Care | Payer: Medicare Other | Source: Ambulatory Visit | Attending: Physician Assistant | Admitting: Physician Assistant

## 2021-08-31 DIAGNOSIS — M8580 Other specified disorders of bone density and structure, unspecified site: Secondary | ICD-10-CM

## 2021-08-31 DIAGNOSIS — Z780 Asymptomatic menopausal state: Secondary | ICD-10-CM

## 2021-09-02 ENCOUNTER — Encounter: Payer: Self-pay | Admitting: Physician Assistant

## 2021-09-02 ENCOUNTER — Ambulatory Visit (INDEPENDENT_AMBULATORY_CARE_PROVIDER_SITE_OTHER)
Admission: RE | Admit: 2021-09-02 | Discharge: 2021-09-02 | Disposition: A | Discharge status: Home / Self Care | Payer: Medicare Other | Source: Ambulatory Visit | Attending: Physician Assistant | Admitting: Physician Assistant

## 2021-09-02 ENCOUNTER — Ambulatory Visit: Payer: Medicare Other | Admitting: Physician Assistant

## 2021-09-02 VITALS — BP 108/72 | HR 100 | Temp 98.0°F | Ht 68.5 in | Wt 177.4 lb

## 2021-09-02 DIAGNOSIS — M8000XA Age-related osteoporosis with current pathological fracture, unspecified site, initial encounter for fracture: Secondary | ICD-10-CM | POA: Diagnosis not present

## 2021-09-02 DIAGNOSIS — M25531 Pain in right wrist: Secondary | ICD-10-CM | POA: Diagnosis not present

## 2021-09-02 DIAGNOSIS — Z87898 Personal history of other specified conditions: Secondary | ICD-10-CM | POA: Diagnosis not present

## 2021-09-02 DIAGNOSIS — R296 Repeated falls: Secondary | ICD-10-CM | POA: Diagnosis not present

## 2021-09-02 DIAGNOSIS — Z8673 Personal history of transient ischemic attack (TIA), and cerebral infarction without residual deficits: Secondary | ICD-10-CM

## 2021-09-02 DIAGNOSIS — M25551 Pain in right hip: Secondary | ICD-10-CM

## 2021-09-02 DIAGNOSIS — Z23 Encounter for immunization: Secondary | ICD-10-CM | POA: Diagnosis not present

## 2021-09-02 DIAGNOSIS — R930 Abnormal findings on diagnostic imaging of skull and head, not elsewhere classified: Secondary | ICD-10-CM

## 2021-09-02 NOTE — Patient Instructions (Signed)
-   Neurology f/up needed  -XRAYs today  - We'll get Prolia ordered  -Start back on Vit D and calcium Recommend '1200mg'$  calcium and 800-1000IU/vitamin D daily.

## 2021-09-02 NOTE — Progress Notes (Unsigned)
Subjective:    Patient ID: Alyssa Deleon, female    DOB: [birth year], 66 y.o.   MRN: [id number]  Chief Complaint  Patient presents with   Dexa Scan results     Pt is wanting to discuss Dexa scan results; pt states she feel yesterday by falling out the bed right wrist and hip bruising;     HPI Patient is in today for discussion about bone density results and recurrent falls.  Ex-fiance - Merry Proud - is here today with patient.   Rib Herniated disc 4 ribs Herniated disc   -She has been taking 3 mg Xanax at bedtime the last few months, now down to 2.5 mg in the last 2 weeks.  -Taking gabapentin 300 mg once in the mornings - has been taking after stroke for left sided facial numbness.  Percocet 10-325 mg q4 hours prn - Dr. Nelva Bush - says she stopped taking this one month ago b/c it was not working. Says ES Tylenol has been helping more.   Last ED visit on 08/01/21 - compression fracture lumbar spine - age-indeterminate; T12 and L2  Mother, sister, grandfather all with history of dementia.     Osteoporosis - Prolia 60 mg    Over-reached for something out of her bed last night, and fell onto R arm.    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 No headaches. No dizziness. Thinks she might need a cane with walking to help with security feeling.   No taking calcium or Vit D currently.   Past Medical History:  Diagnosis Date   Breast cancer ([place])    Cataract, left 09/29/2017   Chronic pain syndrome, on Neurontin 09/30/2017   Dry eye syndrome of both eyes, on Restasis 09/30/2017   Emphysema lung ([place])    Family history of breast cancer    Family history of melanoma    Family history of ovarian cancer    Family history of prostate cancer    Family history of throat cancer    Frequent urinary tract infections    GAD (generalized anxiety disorder) 03/11/2015   Genital herpes    History of chicken pox    History of degenerative disc disease 10/27/2002    Knee osteonecrosis, 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   Major depression, recurrent, chronic ([place]), followed by Psych, on Wellbutrin, Cymbalta 09/30/2017   Mixed hyperlipidemia, on Zetia and Zocor 09/30/2017   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   MVP (mitral valve prolapse) 03/13/2015   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   Nuclear sclerosis 06/10/2013   Panic disorder, followed by Psych, on Xanax 09/30/2017   Personal history of breast cancer    Psoriasis    PTSD (post-traumatic stress disorder) 03/11/2015   Stroke ([place])    Total knee replacement status, 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   Vitamin D deficiency 09/30/2017    Past Surgical History:  Procedure Laterality Date   AUGMENTATION MAMMAPLASTY Bilateral    BREAST BIOPSY     MASTECTOMY Left 1995   has a tram flap   RECONSTRUCTION BREAST W/ TRAM FLAP Left 2003    Family History  Problem Relation Age of Onset   Cancer - Other Mother    Miscarriages / [place] Mother    Alzheimer's disease Mother    Breast cancer Mother    Skin cancer Mother    Cancer - Prostate Father        metastatic   Hyperlipidemia Father    Throat cancer Father  Cancer - Other Sister    Depression Sister    Hearing loss Sister    Hyperlipidemia Sister    Breast cancer Sister        both breasts   Skin cancer Sister    Cancer - Other Brother    Throat cancer Brother    Cancer - Other Sister    Breast cancer Sister 42   Breast cancer Maternal Grandmother    Alzheimer's disease Maternal Grandfather    Ovarian cancer Paternal Grandmother    Heart attack Paternal Grandfather    Breast cancer Paternal Aunt    Prostate cancer Paternal Uncle    Breast cancer Other        both breasts dx 51    Social History   Tobacco Use   Smoking status: Former    Packs/day: 0.75    Years: 42.00    Total pack years: 31.50    Types: Cigarettes    Quit date: 11/06/2020    Years since quitting: 0.8    Passive exposure: Past   Smokeless tobacco: Never   Vaping Use   Vaping Use: Never used  Substance Use Topics   Alcohol use: No    Alcohol/week: 0.0 standard drinks of alcohol   Drug use: Never     Allergies  Allergen Reactions   Hydrocodone-Acetaminophen Shortness Of Breath   Klonopin [Clonazepam] Swelling   Crestor [Rosuvastatin] Other (See Comments)    Myalgias   Morphine Sulfate Itching   Effexor [Venlafaxine] Other (See Comments)    Weight gain   Seasonal Ic [Cholestatin]     Pollen, sinus coughing etc.   Sulfa Antibiotics Diarrhea and Other (See Comments)   Vytorin [Ezetimibe-Simvastatin] Other (See Comments)    Myalgias    Review of Systems NEGATIVE UNLESS OTHERWISE INDICATED IN HPI      Objective:     BP 108/72 (BP Location: Right Arm)   Pulse 100   Temp 98 F (36.7 C) (Temporal)   Ht 5' 8.5" (1.74 m)   Wt 177 lb 6.4 oz (80.5 kg)   SpO2 97%   BMI 26.58 kg/m   Wt Readings from Last 3 Encounters:  09/02/21 177 lb 6.4 oz (80.5 kg)  06/08/21 177 lb (80.3 kg)  05/31/21 177 lb 12.8 oz (80.6 kg)    BP Readings from Last 3 Encounters:  09/02/21 108/72  08/01/21 124/80  05/31/21 110/78     Physical Exam     Assessment & Plan:   Problem List Items Addressed This Visit   None Visit Diagnoses     Need for prophylactic vaccination against Streptococcus pneumoniae (pneumococcus)    -  Primary   Relevant Orders   Pneumococcal conjugate vaccine 20-valent (Prevnar 20)        No orders of the defined types were placed in this encounter.    No follow-ups on file.  This note was prepared with assistance of Systems analyst. Occasional wrong-word or sound-a-like substitutions may have occurred due to the inherent limitations of voice recognition software.  Time Spent: *** minutes of total time was spent on the date of the encounter performing the following actions: chart review prior to seeing the patient, obtaining history, performing a medically necessary exam, counseling on the  treatment plan, placing orders, and documenting in our EHR.       Yama Nielson M Rorey Bisson, PA-C

## 2021-09-03 ENCOUNTER — Telehealth: Payer: Self-pay | Admitting: Physician Assistant

## 2021-09-03 NOTE — Telephone Encounter (Signed)
After speaking with allwardt patient was advised to go to [facility] at [facility].  I have spoken with patient.  Advised to arrive before 7pm for care today.  Patient understood.

## 2021-09-03 NOTE — Telephone Encounter (Signed)
Pt called to inquire about her x ray results completed on 09/02/21. States she would like to know when she could pick up a cast for her wrist. She also expresses she is in immense pain. Pt can be reached at [phone]. Please Advise.

## 2021-09-03 NOTE — Telephone Encounter (Signed)
Pt currently being see at [facility] and provider is aware.

## 2021-09-03 NOTE — Telephone Encounter (Signed)
Please see pt note, not showing any results in as of yet to advise

## 2021-09-03 NOTE — Telephone Encounter (Signed)
FYI--Patient called back to find out what she could do in the meantime for her wrist. States she really doesn't want to go the weekend without some form of intervention for her wrist. If there is any advice, she is willing to try anything.

## 2021-09-06 ENCOUNTER — Telehealth: Payer: Self-pay

## 2021-09-06 NOTE — Telephone Encounter (Signed)
Call came in from [facility] to let me know that the pt had a Rt wrist fracture. According to Steele she had told the pt to go to [place].

## 2021-09-07 ENCOUNTER — Telehealth: Payer: Self-pay | Admitting: Physician Assistant

## 2021-09-07 ENCOUNTER — Encounter: Payer: Self-pay | Admitting: Neurology

## 2021-09-07 ENCOUNTER — Telehealth: Payer: Self-pay

## 2021-09-07 NOTE — Telephone Encounter (Signed)
Noted and agreed, thank you. 

## 2021-09-07 NOTE — Telephone Encounter (Signed)
I was able to schedule a VV for patient on 09/08/21 at 11:30 am.

## 2021-09-07 NOTE — Telephone Encounter (Signed)
Patient call stating her UTI symptoms including burning with urination are still present. She was prescribed Vantin following her ED visit but states this has not helped. I offered her to schedule an OV but she expressed she is unable to drive due to her cast and is not able to find transportation. Please Advise.

## 2021-09-07 NOTE — Telephone Encounter (Signed)
Prolia VOB initiated via [email or web address]  Last OV:  Next OV:  Last Prolia inj:  Next Prolia inj DUE:   

## 2021-09-07 NOTE — Telephone Encounter (Signed)
Schedule Virtual Visit?

## 2021-09-07 NOTE — Telephone Encounter (Signed)
From: Fredirick Lathe, PA-C  Sent: 09/06/2021   8:36 PM EDT  To: [facility]  Subject: Prolia                                         Patient with new diagnosis of osteoporosis. Please help get her started on Prolia. Thanks!

## 2021-09-08 ENCOUNTER — Encounter: Payer: Self-pay | Admitting: Physician Assistant

## 2021-09-08 ENCOUNTER — Telehealth (INDEPENDENT_AMBULATORY_CARE_PROVIDER_SITE_OTHER): Payer: Medicare Other | Admitting: Physician Assistant

## 2021-09-08 VITALS — Ht 68.5 in | Wt 177.0 lb

## 2021-09-08 DIAGNOSIS — R300 Dysuria: Secondary | ICD-10-CM | POA: Diagnosis not present

## 2021-09-08 MED ORDER — CEPHALEXIN 500 MG PO CAPS: 500.0000 mg | ORAL_CAPSULE | Freq: Three times a day (TID) | ORAL | 0 refills | Status: AC

## 2021-09-08 NOTE — Progress Notes (Signed)
   Virtual Visit via Video Note  I connected with  Alyssa Deleon  on 09/08/21 at 11:30 AM EDT by a video enabled telemedicine application and verified that I am speaking with the correct person using two identifiers.  Location: Patient: home Provider: Therapist, music at [place] present: Patient and myself   I discussed the limitations of evaluation and management by telemedicine and the availability of in person appointments. The patient expressed understanding and agreed to proceed.   History of Present Illness:  66 year old female presents for virtual visit to discuss urinary symptoms x 5-7 days. -Urinary frequency / urgency, dysuria.   -No hematuria. -No fever. No flank pain. No suprapubic pain.  -Describes having recurrent UTIs in younger years after intercourse, hasn't been active in about 20 years.    Observations/Objective:   Gen: Awake, alert, no acute distress; R forearm in a cast Resp: Breathing is even and non-labored Psych: calm/pleasant demeanor Neuro: Alert and Oriented x 3, + facial symmetry, speech is clear.   Assessment and Plan:  Dysuria - Suspect simple cystitis, cannot obtain urine sample as this is a video visit and she has no means of transportation right now. Start on cephalexin. Push fluids. May take AZO for symptomatic relief. Recheck sooner if fever, severe back pain, vomiting, or other acutely worsening symptoms.    Follow Up Instructions:    I discussed the assessment and treatment plan with the patient. The patient was provided an opportunity to ask questions and all were answered. The patient agreed with the plan and demonstrated an understanding of the instructions.   The patient was advised to call back or seek an in-person evaluation if the symptoms worsen or if the condition fails to improve as anticipated.  Prescott Truex M Luticia Tadros, PA-C

## 2021-09-13 ENCOUNTER — Other Ambulatory Visit: Payer: Self-pay | Admitting: Physician Assistant

## 2021-09-13 DIAGNOSIS — F063 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, unspecified: Secondary | ICD-10-CM

## 2021-09-13 NOTE — Telephone Encounter (Signed)
Last OV: 09/08/21 VV  Next OV: 06/30/22  Last filled: 02/25/21  Quantity: 180 w/ 1 refill

## 2021-09-16 NOTE — Telephone Encounter (Signed)
Prior auth required for PROLIA  PA PROCESS DETAILS: Effective 03/28/2021 if the patient is new to Prolia, Prior authorization and Step Therapy are required & not on file. Please go to [email or web address] or call [phone] to initiate the prior authorization. For exception to the policy please visit [email or web address] and review Policy Number [id number] 

## 2021-09-16 NOTE — Telephone Encounter (Signed)
Prior Authorization initiated for [facility] via [facility] Provider portal.  Case ID: [id number]

## 2021-09-17 ENCOUNTER — Encounter: Payer: Self-pay | Admitting: Dermatology

## 2021-09-17 NOTE — Progress Notes (Signed)
   New Patient   Subjective  Alyssa Deleon is a 66 y.o. female who presents for the following: Annual Exam (Has a lesion above lip. Wants to make sure its not skin cancer. Its flaky. Patient did her own cyst removal on abdomen. ).  General skin check, concerned about spot over lip Location:  Duration:  Quality:  Associated Signs/Symptoms: Modifying Factors:  Severity:  Timing: Context:    The following portions of the chart were reviewed this encounter and updated as appropriate:  Tobacco  Allergies  Meds  Problems  Med Hx  Surg Hx  Fam Hx      Objective  Well appearing patient in no apparent distress; mood and affect are within normal limits. All sun exposed areas and back examined: No atypical pigmented lesions (all checked with dermoscopy), no nonmelanoma skin cancer.  Left Thigh - Posterior 4 mm violet colored smooth dermal papule, compatible dermoscopy  Right Abdomen (side) - Lower Reepithelialized 8 mm crater from her home surgery.  Head - Anterior (Face) 1 mm upper dermal hard white papules  Left Upper Cutaneous Lip patient pointed out pink spot above lip, currently there is no crust or probability.    All sun exposed areas plus back examined.   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  Cyst of skin Right Abdomen (side) - Lower  No residual cyst palpable  Milia Head - Anterior (Face)  May choose to remove in future.  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

## 2021-09-20 ENCOUNTER — Encounter: Payer: Self-pay | Admitting: Neurology

## 2021-09-20 ENCOUNTER — Ambulatory Visit: Payer: Medicare Other | Admitting: Neurology

## 2021-09-20 VITALS — BP 98/69 | HR 95 | Ht 67.5 in | Wt 175.0 lb

## 2021-09-20 DIAGNOSIS — Z87898 Personal history of other specified conditions: Secondary | ICD-10-CM | POA: Diagnosis not present

## 2021-09-20 DIAGNOSIS — R296 Repeated falls: Secondary | ICD-10-CM | POA: Diagnosis not present

## 2021-09-20 DIAGNOSIS — Z8673 Personal history of transient ischemic attack (TIA), and cerebral infarction without residual deficits: Secondary | ICD-10-CM

## 2021-09-20 DIAGNOSIS — M5136 Other intervertebral disc degeneration, lumbar region: Secondary | ICD-10-CM | POA: Diagnosis not present

## 2021-09-23 NOTE — Telephone Encounter (Signed)
Criteria Not Met    [employer] preferred osteporosis medications: Alendronate Aredia (brand name) Boniva (brand name) Reclast (brand name) Risedronate Zoledronic Acid Zometa (brand name) 

## 2021-09-24 ENCOUNTER — Ambulatory Visit: Payer: Medicare Other | Admitting: Physician Assistant

## 2021-09-24 ENCOUNTER — Encounter: Payer: Self-pay | Admitting: Physician Assistant

## 2021-09-24 ENCOUNTER — Telehealth: Payer: Self-pay | Admitting: Physician Assistant

## 2021-09-24 ENCOUNTER — Other Ambulatory Visit (HOSPITAL_BASED_OUTPATIENT_CLINIC_OR_DEPARTMENT_OTHER): Payer: Self-pay

## 2021-09-24 VITALS — BP 100/70 | HR 85 | Temp 97.9°F | Ht 67.5 in | Wt 176.0 lb

## 2021-09-24 DIAGNOSIS — R300 Dysuria: Secondary | ICD-10-CM

## 2021-09-24 DIAGNOSIS — H9202 Otalgia, left ear: Secondary | ICD-10-CM | POA: Diagnosis not present

## 2021-09-24 LAB — POCT URINALYSIS DIPSTICK
Bilirubin, UA: NEGATIVE
Blood, UA: POSITIVE
Glucose, UA: NEGATIVE
Ketones, UA: POSITIVE
Nitrite, UA: NEGATIVE
Protein, UA: POSITIVE — AB
Spec Grav, UA: 1.015 (ref 1.010–1.025)
Urobilinogen, UA: 0.2 E.U./dL
pH, UA: 7 (ref 5.0–8.0)

## 2021-09-24 MED ORDER — CEPHALEXIN 500 MG PO CAPS
500.0000 mg | ORAL_CAPSULE | Freq: Two times a day (BID) | ORAL | 0 refills | Status: DC
  Filled 2021-09-24: qty 10, 5d supply, fill #0

## 2021-09-24 MED ORDER — ACETIC ACID 2 % OT SOLN
4.0000 [drp] | OTIC | 0 refills | Status: DC
  Filled 2021-09-24: qty 15, 10d supply, fill #0

## 2021-09-24 NOTE — Telephone Encounter (Signed)
Patient  need OV for evaluation  Patient aware someone will call her for appt

## 2021-09-24 NOTE — Progress Notes (Signed)
Alyssa Deleon is a 66 y.o. female here for a follow up of a pre-existing problem.  History of Present Illness:   Chief Complaint  Patient presents with   Dysuria    Pt c/o dysuria and frequency started this morning. Pt was treated for UTI last week and finished abx 3 days ago. Denies fever or chills.    HPI  UTI Patient here with concern for recurrent UTI symptoms. Her symptoms include dysuria and urinary frequency. She had video visit with Dr. Theresa Duty on 09/08/2021 for similar issue and was prescribed course of Cephalexin 3 times daily for 7 days. States she has completed her antibiotics about 3 days ago. However her symptoms have returned. Prior to this, she was seen in the ED on 08/01/2021 and was prescribed Cefpodxonie 200 mg twice daily. T  Has had UTI's in the past after intercourse. She states this feels similar to prior urinary tract infection. She does have hx of kidney stones. Tries to drink lots of fluids but this does not help as well. Denies fever or chills. No diarrhea or flank pain. No back pain. No abdominal pain nausea or vomiting   Ear Popping  Patient complain of popping sensation of left ear for the past 3 weeks. Has been congested all the time. Denies ear pain or discharge. Denies any other symptoms.    Past Medical History:  Diagnosis Date   Breast cancer ([place])    Cataract, left 09/29/2017   Chronic pain syndrome, on Neurontin 09/30/2017   Dry eye syndrome of both eyes, on Restasis 09/30/2017   Emphysema lung ([place])    Family history of breast cancer    Family history of melanoma    Family history of ovarian cancer    Family history of prostate cancer    Family history of throat cancer    Frequent urinary tract infections    GAD (generalized anxiety disorder) 03/11/2015   Genital herpes    History of chicken pox    History of degenerative disc disease 10/27/2002   Knee osteonecrosis, 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   Major depression, recurrent, chronic  ([place]), followed by Psych, on Wellbutrin, Cymbalta 09/30/2017   Mixed hyperlipidemia, on Zetia and Zocor 09/30/2017   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   MVP (mitral valve prolapse) 03/13/2015   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   Nuclear sclerosis 06/10/2013   Panic disorder, followed by Psych, on Xanax 09/30/2017   Personal history of breast cancer    Psoriasis    PTSD (post-traumatic stress disorder) 03/11/2015   Stroke ([place])    Total knee replacement status, 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   Vitamin D deficiency 09/30/2017     Social History   Tobacco Use   Smoking status: Former    Packs/day: 0.75    Years: 42.00    Total pack years: 31.50    Types: Cigarettes    Quit date: 11/06/2020    Years since quitting: 0.8    Passive exposure: Past   Smokeless tobacco: Never  Vaping Use   Vaping Use: Never used  Substance Use Topics   Alcohol use: Yes    Comment: Special Occasions   Drug use: Never    Past Surgical History:  Procedure Laterality Date   AUGMENTATION MAMMAPLASTY Bilateral    BREAST BIOPSY     MASTECTOMY Left 1995   has a tram flap   RECONSTRUCTION BREAST W/ TRAM FLAP Left 2003    Family History  Problem Relation Age of Onset  Cancer - Other Mother    Miscarriages / Stillbirths Mother    Alzheimer's disease Mother    Breast cancer Mother    Skin cancer Mother    Cancer - Prostate Father        metastatic   Hyperlipidemia Father    Throat cancer Father    Cancer - Other Sister    Depression Sister    Hearing loss Sister    Hyperlipidemia Sister    Breast cancer Sister        both breasts   Skin cancer Sister    Cancer - Other Brother    Throat cancer Brother    Cancer - Other Sister    Breast cancer Sister 63   Breast cancer Maternal Grandmother    Alzheimer's disease Maternal Grandfather    Ovarian cancer Paternal Grandmother    Heart attack Paternal Grandfather    Breast cancer Paternal Aunt    Prostate cancer Paternal Uncle     Breast cancer Other        both breasts dx 69    Allergies  Allergen Reactions   Hydrocodone-Acetaminophen Shortness Of Breath   Klonopin [Clonazepam] Swelling   Crestor [Rosuvastatin] Other (See Comments)    Myalgias   Morphine Sulfate Itching   Effexor [Venlafaxine] Other (See Comments)    Weight gain   Seasonal Ic [Cholestatin]     Pollen, sinus coughing etc.   Sulfa Antibiotics Diarrhea and Other (See Comments)   Vytorin [Ezetimibe-Simvastatin] Other (See Comments)    Myalgias    Current Medications:   Current Outpatient Medications:    ALPRAZolam (XANAX) 1 MG tablet, TAKE 2 TABLETS BY MOUTH AT  BEDTIME AS NEEDED FOR ANXIETY, Disp: 180 tablet, Rfl: 0   aspirin EC 81 MG tablet, Take 1 tablet (81 mg total) by mouth daily. Swallow whole., Disp: 30 tablet, Rfl: 11   buPROPion (WELLBUTRIN XL) 300 MG 24 hr tablet, Take 1 tablet (300 mg total) by mouth daily., Disp: 90 tablet, Rfl: 3   DULoxetine (CYMBALTA) 60 MG capsule, Take 1 capsule (60 mg total) by mouth daily., Disp: 90 capsule, Rfl: 3   ezetimibe (ZETIA) 10 MG tablet, Take 1 tablet (10 mg total) by mouth daily., Disp: 90 tablet, Rfl: 3   oxyCODONE-acetaminophen (PERCOCET) 10-325 MG tablet, Take 1 tablet by mouth every 4 (four) hours as needed for pain., Disp: , Rfl:    simvastatin (ZOCOR) 40 MG tablet, Take 1 tablet (40 mg total) by mouth daily., Disp: 90 tablet, Rfl: 3   gabapentin (NEURONTIN) 300 MG capsule, Take 1 capsule (300 mg total) by mouth 2 (two) times daily., Disp: 180 capsule, Rfl: 3   Review of Systems:   ROS Negative unless otherwise specified per HPI.   Vitals:   Vitals:   09/24/21 1532  BP: 100/70  Pulse: 85  Temp: 97.9 F (36.6 C)  TempSrc: Temporal  SpO2: 96%  Weight: 176 lb (79.8 kg)  Height: 5' 7.5" (1.715 m)     Body mass index is 27.16 kg/m.  Physical Exam:   Physical Exam Vitals and nursing note reviewed.  Constitutional:      General: She is not in acute distress.    Appearance:  She is well-developed. She is not ill-appearing or toxic-appearing.  HENT:     Head: Normocephalic and atraumatic.     Right Ear: Tympanic membrane, ear canal and external ear normal. Tympanic membrane is not erythematous, retracted or bulging.     Left Ear: Ear canal and  external ear normal. Drainage present. Tympanic membrane is not erythematous, retracted or bulging.     Nose: Nose normal.     Right Sinus: No maxillary sinus tenderness or frontal sinus tenderness.     Left Sinus: No maxillary sinus tenderness or frontal sinus tenderness.     Mouth/Throat:     Pharynx: Uvula midline. No posterior oropharyngeal erythema.  Eyes:     General: Lids are normal.     Conjunctiva/sclera: Conjunctivae normal.  Neck:     Trachea: Trachea normal.  Cardiovascular:     Rate and Rhythm: Normal rate and regular rhythm.     Pulses: Normal pulses.     Heart sounds: Normal heart sounds, S1 normal and S2 normal.  Pulmonary:     Effort: Pulmonary effort is normal.     Breath sounds: Normal breath sounds. No decreased breath sounds, wheezing, rhonchi or rales.  Lymphadenopathy:     Cervical: No cervical adenopathy.  Skin:    General: Skin is warm and dry.  Neurological:     Mental Status: She is alert.     GCS: GCS eye subscore is 4. GCS verbal subscore is 5. GCS motor subscore is 6.  Psychiatric:        Speech: Speech normal.        Behavior: Behavior normal. Behavior is cooperative.    Results for orders placed or performed in visit on 09/24/21  POCT urinalysis dipstick  Result Value Ref Range   Color, UA yellow    Clarity, UA cloudy    Glucose, UA Negative Negative   Bilirubin, UA neg    Ketones, UA positive    Spec Grav, UA 1.015 1.010 - 1.025   Blood, UA positive    pH, UA 7.0 5.0 - 8.0   Protein, UA Positive (A) Negative   Urobilinogen, UA 0.2 0.2 or 1.0 E.U./dL   Nitrite, UA neg    Leukocytes, UA Small (1+) (A) Negative   Appearance     Odor      Assessment and Plan:    Dysuria No red flags Restart keflex 500 mg BID x 7 days Urine culture sent -- will notify patient of results and adjust tx prn Push fluids Worsening precautions advised  Left ear pain Clear drainage Will treat with acetic acid drops TM appears intact without erythema Follow-up as needed  I,Savera Zaman,acting as a scribe for [employer], PA.,have documented all relevant documentation on the behalf of Inda Coke, PA,as directed by  Inda Coke, PA while in the presence of Inda Coke, [place].   I, Inda Coke, [place], have reviewed all documentation for this visit. The documentation on 09/24/21 for the exam, diagnosis, procedures, and orders are all accurate and complete.   Inda Coke, PA-C

## 2021-09-24 NOTE — Telephone Encounter (Signed)
FYI

## 2021-09-24 NOTE — Telephone Encounter (Signed)
Pt states her Handicap Placard was denied due to missing signature.  She states she has another UTI and would like medication called. I advised an office visit. She refused, stating she has a broken wrist.   Also, she is wanting a medication for brittle bones.

## 2021-09-24 NOTE — Telephone Encounter (Signed)
Pt scheduled with on 06/30 with SW for possible UTI  FO Rep explained the appointment today would focus on the UTI complaint, possible discussion of brittle bones may have to wait for PCP; Hanicap placard would need to be left at front office desk for PCP to sign if SW is unable to handle.   Pt stated understanding.  FYI: Pt states she is unhappy with practice and wants to leave. Pt states she is having a hard time because once her long-time doctor retired, all other PCPs keep leaving and now is unhappy with [facility].

## 2021-09-24 NOTE — Patient Instructions (Signed)
It was great to see you!  Start keflex 500 mg twice daily  I will be in touch with your urine culture results on MONDAY  For your ear, start the drops If any worsening or lack of improvement, come back and see Alysssa  General instructions Make sure you: Pee until your bladder is empty. Do not hold pee for a long time. Empty your bladder after sex. Wipe from front to back after pooping if you are a female. Use each tissue one time when you wipe. Drink enough fluid to keep your pee pale yellow. Keep all follow-up visits as told by your doctor. This is important. Contact a doctor if: You do not get better after 1-2 days. Your symptoms go away and then come back. Get help right away if: You have very bad back pain. You have very bad pain in your lower belly. You have a fever. You are sick to your stomach (nauseous). You are throwing up.    Take care,  Inda Coke PA-C

## 2021-09-27 ENCOUNTER — Encounter: Payer: Self-pay | Admitting: Physician Assistant

## 2021-09-27 LAB — URINE CULTURE
MICRO NUMBER:: 13594438
SPECIMEN QUALITY:: ADEQUATE

## 2021-09-29 ENCOUNTER — Ambulatory Visit: Payer: Medicare Other | Attending: Neurology | Admitting: Physical Therapy

## 2021-09-29 ENCOUNTER — Telehealth: Payer: Self-pay | Admitting: Physician Assistant

## 2021-09-29 ENCOUNTER — Other Ambulatory Visit (HOSPITAL_BASED_OUTPATIENT_CLINIC_OR_DEPARTMENT_OTHER): Payer: Self-pay

## 2021-09-29 ENCOUNTER — Other Ambulatory Visit: Payer: Self-pay | Admitting: Physician Assistant

## 2021-09-29 ENCOUNTER — Encounter: Payer: Self-pay | Admitting: Physical Therapy

## 2021-09-29 VITALS — BP 101/61 | HR 80

## 2021-09-29 DIAGNOSIS — R2689 Other abnormalities of gait and mobility: Secondary | ICD-10-CM | POA: Diagnosis present

## 2021-09-29 DIAGNOSIS — M5136 Other intervertebral disc degeneration, lumbar region: Secondary | ICD-10-CM | POA: Insufficient documentation

## 2021-09-29 DIAGNOSIS — R296 Repeated falls: Secondary | ICD-10-CM | POA: Diagnosis not present

## 2021-09-29 DIAGNOSIS — R2681 Unsteadiness on feet: Secondary | ICD-10-CM | POA: Insufficient documentation

## 2021-09-29 MED ORDER — CIPROFLOXACIN HCL 500 MG PO TABS
250.0000 mg | ORAL_TABLET | Freq: Two times a day (BID) | ORAL | 0 refills | Status: AC
  Filled 2021-09-29: qty 3, 3d supply, fill #0

## 2021-09-29 NOTE — Telephone Encounter (Signed)
Pt states this is day 5 and that her urine does not smell like the infection is gone.   Please call back to discuss more medication.  Pt declined offer to come back in to have urine tested.

## 2021-09-29 NOTE — Telephone Encounter (Signed)
Signed, Tomasa Hosteller will call pt for pick up.

## 2021-09-29 NOTE — Therapy (Unsigned)
OUTPATIENT PHYSICAL THERAPY NEURO EVALUATION   [name] MRN: [id number] DOB:[birth year], 66 y.o., female Today's Date: 09/30/2021   PCP: Fredirick Lathe, PA-C REFERRING PROVIDER: Pieter Partridge, DO    PT End of Session - 09/29/21 1117     Visit Number 1    Number of Visits 9   8+eval   Date for PT Re-Evaluation 12/10/21    Authorization Type [employer] MEDICARE    Progress Note Due on Visit 10    PT Start Time 1110    PT Stop Time 1147    PT Time Calculation (min) 37 min    Activity Tolerance Patient tolerated treatment well    Behavior During Therapy WFL for tasks assessed/performed             Past Medical History:  Diagnosis Date   Breast cancer ([place])    Cataract, left 09/29/2017   Chronic pain syndrome, on Neurontin 09/30/2017   Dry eye syndrome of both eyes, on Restasis 09/30/2017   Emphysema lung ([place])    Family history of breast cancer    Family history of melanoma    Family history of ovarian cancer    Family history of prostate cancer    Family history of throat cancer    Frequent urinary tract infections    GAD (generalized anxiety disorder) 03/11/2015   Genital herpes    History of chicken pox    History of degenerative disc disease 10/27/2002   Knee osteonecrosis, 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/29/2017   Major depression, recurrent, chronic ([place]), followed by Psych, on Wellbutrin, Cymbalta 09/30/2017   Mixed hyperlipidemia, on Zetia and Zocor 09/30/2017   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   MVP (mitral valve prolapse) 03/13/2015   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   Nuclear sclerosis 06/10/2013   Panic disorder, followed by Psych, on Xanax 09/30/2017   Personal history of breast cancer    Psoriasis    PTSD (post-traumatic stress disorder) 03/11/2015   Stroke ([place])    Total knee replacement status, 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   Vitamin D deficiency 09/30/2017   Past Surgical History:  Procedure Laterality Date    AUGMENTATION MAMMAPLASTY Bilateral    BREAST BIOPSY     MASTECTOMY Left 1995   has a tram flap   RECONSTRUCTION BREAST W/ TRAM FLAP Left 2003   Patient Active Problem List   Diagnosis Date Noted   Aortic atherosclerosis ([place]) 10/29/2020   Emphysema lung ([place]) 10/29/2020   Genetic testing 03/27/2018   Personal history of breast cancer    Bilateral lower extremity edema 02/03/2018   Epidermoid cyst of skin 02/03/2018   B12 deficiency 02/03/2018   Mixed hyperlipidemia, on Zetia and Zocor 09/30/2017   Chronic pain syndrome, on Neurontin 09/30/2017   Vitamin D deficiency 09/30/2017   Major depression, recurrent, chronic ([place]), followed by Psych, on Wellbutrin, Cymbalta 09/30/2017   Panic disorder, followed by Psych, on Xanax 09/30/2017   Dry eye syndrome of both eyes, on Restasis 09/30/2017   History of stroke, on ASA 09/30/2017   Cataract, left 09/29/2017   Knee osteonecrosis, left (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   Nuclear sclerosis 06/10/2013   History of degenerative disc disease 10/27/2002   History of brain tumor 09/01/1997  ONSET DATE: 09/20/2021 (referral)  REFERRING DIAG: R29.6 (ICD-10-CM) - Frequent falls M51.36 (ICD-10-CM) - Degenerative disc disease, lumbar   THERAPY DIAG:  Repeated falls  Unsteadiness on feet  Other abnormalities of gait and mobility  Rationale for Evaluation and Treatment Rehabilitation  SUBJECTIVE:                                                                                                                                                                                              SUBJECTIVE STATEMENT: "For several years I've been falling but not all the time, but since July  whenever I fall I get injured and by the time I'm healed I fall again.  I don't get dizzy, but I walk like I'm drunk." She has a follow-up for her wrist on 10/11/2021. 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 Yes: NPRS scale: 8/10 Pain location: right wrist-in cast Pain description: shooting pain only with movement Aggravating factors: moving Relieving factors: rest-pain is 0 at rest  PRECAUTIONS: [place] Yes right wrist in cast  FALLS: Has patient fallen in last 6 months? Yes. Number of falls 3-4; most recent fall was about 2 weeks ago when she broke her wrist reaching across her body in the bed and fell into a plastic box  LIVING ENVIRONMENT: Lives with: lives alone Lives in: House/apartment Stairs: Yes: Internal: 14 steps; on right going up Has following equipment at home: shower chair, bed side commode, and walker w/o wheels  PLOF: Independent and she states she needs some help due to right wrist in cast; avoids walking long distances  PATIENT GOALS "To try to get some balance."  OBJECTIVE:   VITALS (RUE in sitting prior to activity): Today's Vitals   09/29/21 1113  BP: 101/61  Pulse: 80   DIAGNOSTIC FINDINGS: Brain MRI scheduled 10/02/2021  COGNITION: Overall cognitive status: Within functional limits for tasks assessed   SENSATION: Light touch: Impaired -pt states she feels more on the right side, difficulty discerning touch on the foot stating she often feels like she's wearing shoes when she is not.  COORDINATION: LE RAMPS:  WNL bilaterally Heel-to-shin:  WNL bilaterally  EDEMA:  None noted bilaterally.  MUSCLE TONE: None noted  bilaterally.  POSTURE: No Significant postural limitations  LOWER EXTREMITY ROM:     Active  Right Eval Left Eval  Hip flexion [facility]  bilaterally; no notable discrepancies from side-to-side.  Hip extension   Hip abduction   Hip adduction   Hip internal rotation   Hip external rotation   Knee flexion   Knee extension   Ankle dorsiflexion   Ankle plantarflexion   Ankle inversion    Ankle eversion     (Blank rows = not tested)  LOWER EXTREMITY MMT:    MMT Right Eval Left Eval  Hip flexion 4/5 4/5  Hip extension    Hip abduction 4/5 4/5  Hip adduction 4+/5 4+/5  Hip internal rotation    Hip external rotation    Knee flexion    Knee extension 4/5 4-/5  Ankle dorsiflexion 4+/5 4/5  Ankle plantarflexion    Ankle inversion    Ankle eversion    (Blank rows = not tested)  BED MOBILITY:  Pt reports no issues.  TRANSFERS: Assistive device utilized: None  Sit to stand: Complete Independence Stand to sit: Complete Independence Floor: Total A-pt states she cannot get up from floor  GAIT: Gait pattern: step through pattern, decreased arm swing- Right, decreased stride length, trunk flexed, and narrow BOS Distance walked: 20' during assessments Assistive device utilized: None Level of assistance: SBA Comments: When turning into hallway for 10MWT pt loses balance posteriorly, but denies dizziness.  No other LOB during ambulation.  FUNCTIONAL TESTs:  5 times sit to stand: 12.78 sec w/ LUE support only 10 meter walk test: 17.60sec = 0.57 m/sec OR 1.88 ft/sec  PATIENT SURVEYS:  FOTO N/A  TODAY'S TREATMENT:  N/A   PATIENT EDUCATION: Education details: PT POC, assessments used, goals set. Person educated: Patient Education method: Explanation Education comprehension: verbalized understanding   HOME EXERCISE PROGRAM: To be established.    GOALS: Goals reviewed with patient? Yes  SHORT TERM GOALS: Target date: 10/29/2021  Pt will be independent with strength  and balance HEP with supervision as needed. Baseline:  To be established. Goal status: INITIAL  2.  Pt will decrease 5xSTS to <12 seconds w/o UE support in order to demonstrate decreased risk for falls and improved functional bilateral LE strength and power. Baseline: 12.78 sec w/ LUE support Goal status: INITIAL  3.  Pt will demonstrate a gait speed of >/=2.0 feet/sec in order to decrease risk for falls. Baseline: 1.88 ft/sec Goal status: INITIAL  4.  *** Baseline:  Goal status: {GOALSTATUS:25110}  5.  *** Baseline:  Goal status: {GOALSTATUS:25110}  6.  *** Baseline:  Goal status: {GOALSTATUS:25110}  LONG TERM GOALS: Target date: 11/26/2021  *** Baseline:  Goal status: {GOALSTATUS:25110}  2.  *** Baseline:  Goal status: {GOALSTATUS:25110}  3.  *** Baseline:  Goal status: {GOALSTATUS:25110}  4.  *** Baseline:  Goal status: {GOALSTATUS:25110}  5.  *** Baseline:  Goal status: {GOALSTATUS:25110}  6.  *** Baseline:  Goal status: {GOALSTATUS:25110}  ASSESSMENT:  CLINICAL IMPRESSION: Patient is a 66 y.o. female who was seen today for physical therapy evaluation and treatment for frequent falls and imbalance.  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  Identified impairments include ***.  Evaluation via the following assessment tools: *** indicate fall risk.  They would benefit from skilled PT to address impairments as noted and progress towards long term goals.   OBJECTIVE IMPAIRMENTS {opptimpairments:25111}.   ACTIVITY LIMITATIONS {activitylimitations:27494}  PARTICIPATION LIMITATIONS: {  participationrestrictions:25113}  PERSONAL FACTORS {Personal factors:25162} are also affecting patient's functional outcome.    REHAB POTENTIAL: Good  CLINICAL DECISION MAKING: Evolving/moderate complexity  EVALUATION COMPLEXITY: Moderate  PLAN: PT FREQUENCY: 1x/week  PT DURATION: 8 weeks  PLANNED INTERVENTIONS: Therapeutic exercises, Therapeutic activity, Neuromuscular re-education, Balance training, Gait training, Patient/Family education, Joint mobilization, Stair training, Vestibular training, DME instructions, Manual therapy, and Re-evaluation  PLAN FOR NEXT SESSION: FGA, initiate HEP for strength and balance, review fall prevention strategies-handout, floor recovery, further assess stairs and initiate stair training, gait training w/ and w/o LRAD   Bary Richard, PT, DPT 09/30/2021, 1:07 PM

## 2021-09-29 NOTE — Telephone Encounter (Signed)
Please see message and advise 

## 2021-09-29 NOTE — Telephone Encounter (Signed)
Pt states she gave paperwork for a prior authorization for medication related to "brittle bones" to "the girl" when last in the office.   Pt states it needed to be acted upon "fast" to work out.   FO Rep could not confirm receipt of these papers.    Please call patient for follow up.

## 2021-09-29 NOTE — Telephone Encounter (Signed)
Patient is referring to prolia. PA was denied, but I have sent a message to prolia coordinator to reach out to insurance. T score of -3.8 should not have been denied.

## 2021-09-29 NOTE — Telephone Encounter (Signed)
Pt requested signed application for handicap to be mailed to her.  Application is in an enveloped, addressed and placed in outgoing mailbox.  No further follow up needed at this time.

## 2021-09-30 NOTE — Telephone Encounter (Signed)
Spoke to pt told her calling about her message. Told her Aldona Bar has sent in Cipro antibiotic to [facility] for her to take instead. She can start this when she picks this up. Pt verbalized understanding.

## 2021-10-02 ENCOUNTER — Ambulatory Visit
Admission: RE | Admit: 2021-10-02 | Discharge: 2021-10-02 | Disposition: A | Discharge status: Home / Self Care | Payer: Medicare Other | Source: Ambulatory Visit | Attending: Neurology | Admitting: Neurology

## 2021-10-02 DIAGNOSIS — Z87898 Personal history of other specified conditions: Secondary | ICD-10-CM

## 2021-10-02 DIAGNOSIS — Z8673 Personal history of transient ischemic attack (TIA), and cerebral infarction without residual deficits: Secondary | ICD-10-CM

## 2021-10-02 MED ORDER — GADOBENATE DIMEGLUMINE 529 MG/ML IV SOLN
16.0000 mL | Freq: Once | INTRAVENOUS | Status: AC | PRN
  Administered 2021-10-02: 16 mL via INTRAVENOUS

## 2021-10-05 NOTE — Telephone Encounter (Signed)
Thank you for the information! Per Alyssa she said she would prefer her to be on the Prolia so if you can appeal this and there's a chance we can get it approved start with that. If it's denied there is no reason why she shouldn't be able to use Reclast. Please let me know if there is anything I can do. Thanks

## 2021-10-07 ENCOUNTER — Encounter: Payer: Medicare Other | Admitting: Dermatology

## 2021-10-08 ENCOUNTER — Ambulatory Visit: Payer: Medicare Other | Admitting: Physical Therapy

## 2021-10-11 NOTE — Telephone Encounter (Signed)
Please draft the following information on practice letterhead, have Alyssa sign, and send signed letter back to me, thanks!  Patient: Caysie Minnifield Date of Birth: [birth year] Policy # / Patient ID [id number]  Group # [id number] Re: APPEAL / Coverage of Prolia 60 mg/mL To Whom it May Concern: I am writing to request a review of a denied claim for [employer]. Your company has denied authorization for Prolia 60 mg/mL for treatment of Osteoporosis.  Adream has no prior treatments for Osteoporosis, though DEXA scan 08/31/2021 showed severe Osteoporosis with BMD of -3.8 at L1-L4 and -3.1 at Right Femoral Neck. Cyndi is on an OTC Calcium and Vitamin D Supplements.  Jacquilyn has a history of multiple fractures including the left wrist, rib, and vertebrae; mostly recently compression fracture at L2 08/01/21. 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  Calissa is also a high fall risk due to the following: Impaired vision Impaired balance/gait/mobility History of fall(s) Wende has been on the following high-risk medications known to potential cause skeletal harm: Cymbalta Gabapentin It is my clinical opinion that treatment with Prolia 60 mg/mL will be extremely beneficial for this patient's medical condition. It is my clinical opinion and assessment that Tranika Scholler will benefit from treatment with Prolia 60 mg/mL for severe Osteoporosis. I trust that the enclosed information, along with my medical recommendations, will establish the  medical necessity for approval of Prolia 60 mg/mL. Please call my office at [phone] if I can provide you with any additional information to  approve my request. I look forward to receiving your timely response and approval of this request. Sincerely

## 2021-10-12 ENCOUNTER — Encounter: Payer: Self-pay | Admitting: Physician Assistant

## 2021-10-14 ENCOUNTER — Encounter: Payer: Medicare Other | Admitting: Dermatology

## 2021-10-14 NOTE — Telephone Encounter (Signed)
Letter faxed and sent via scanned email to Josepha Pigg as advised for appeal

## 2021-10-15 ENCOUNTER — Ambulatory Visit: Payer: Medicare Other | Admitting: Physical Therapy

## 2021-10-19 ENCOUNTER — Ambulatory Visit: Payer: Medicare Other | Admitting: Physician Assistant

## 2021-10-19 ENCOUNTER — Encounter: Payer: Self-pay | Admitting: Physician Assistant

## 2021-10-19 VITALS — BP 98/68 | HR 80 | Temp 97.6°F | Ht 67.5 in | Wt 176.0 lb

## 2021-10-19 DIAGNOSIS — Z87442 Personal history of urinary calculi: Secondary | ICD-10-CM

## 2021-10-19 DIAGNOSIS — R319 Hematuria, unspecified: Secondary | ICD-10-CM | POA: Diagnosis not present

## 2021-10-19 DIAGNOSIS — N390 Urinary tract infection, site not specified: Secondary | ICD-10-CM | POA: Diagnosis not present

## 2021-10-19 LAB — POCT URINALYSIS DIPSTICK
Bilirubin, UA: NEGATIVE
Glucose, UA: NEGATIVE
Ketones, UA: NEGATIVE
Nitrite, UA: NEGATIVE
Odor: POSITIVE
Protein, UA: POSITIVE — AB
Spec Grav, UA: 1.01 (ref 1.010–1.025)
Urobilinogen, UA: 0.2 E.U./dL
pH, UA: 6.5 (ref 5.0–8.0)

## 2021-10-19 NOTE — Telephone Encounter (Signed)
Appeal initiated via [facility] provider portal.

## 2021-10-19 NOTE — Progress Notes (Signed)
Subjective:    Patient ID: Alyssa Deleon, female    DOB: Aug 14, 1955, 66 y.o.   MRN: [id number]  Chief Complaint  Patient presents with   Hematuria    Pt c/o blood after wiping when urinating; and odor in urine; pt c/o smell but no pain as of right now; had symptoms for 2 months per pt.    HPI Patient is in today for hematuria and urinary odor started back yesterday. Previously treated on 6/14 and 09/24/21 for UTI. Symptoms resolved each time. No pain. No vaginal discharge. No fever or chills. No other symptoms to report today. Hx of renal stones.   Past Medical History:  Diagnosis Date   Breast cancer ([place])    Cataract, left 09/29/2017   Chronic pain syndrome, on Neurontin 09/30/2017   Dry eye syndrome of both eyes, on Restasis 09/30/2017   Emphysema lung ([place])    Family history of breast cancer    Family history of melanoma    Family history of ovarian cancer    Family history of prostate cancer    Family history of throat cancer    Frequent urinary tract infections    GAD (generalized anxiety disorder) 03/11/2015   Genital herpes    History of chicken pox    History of degenerative disc disease 10/27/2002   Knee osteonecrosis, 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   Major depression, recurrent, chronic ([place]), followed by Psych, on Wellbutrin, Cymbalta 09/30/2017   Mixed hyperlipidemia, on Zetia and Zocor 09/30/2017   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   MVP (mitral valve prolapse) 03/13/2015   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   Nuclear sclerosis 06/10/2013   Panic disorder, followed by Psych, on Xanax 09/30/2017   Personal history of breast cancer    Psoriasis    PTSD (post-traumatic stress disorder) 03/11/2015   Stroke ([place])    Total knee replacement status, 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   Vitamin D deficiency 09/30/2017    Past Surgical History:  Procedure Laterality Date   AUGMENTATION MAMMAPLASTY Bilateral    BREAST BIOPSY     MASTECTOMY Left 1995   has a tram  flap   RECONSTRUCTION BREAST W/ TRAM FLAP Left 2003    Family History  Problem Relation Age of Onset   Cancer - Other Mother    Miscarriages / [place] Mother    Alzheimer's disease Mother    Breast cancer Mother    Skin cancer Mother    Cancer - Prostate Father        metastatic   Hyperlipidemia Father    Throat cancer Father    Cancer - Other Sister    Depression Sister    Hearing loss Sister    Hyperlipidemia Sister    Breast cancer Sister        both breasts   Skin cancer Sister    Cancer - Other Brother    Throat cancer Brother    Cancer - Other Sister    Breast cancer Sister 61   Breast cancer Maternal Grandmother    Alzheimer's disease Maternal Grandfather    Ovarian cancer Paternal Grandmother    Heart attack Paternal Grandfather    Breast cancer Paternal Aunt    Prostate cancer Paternal Uncle    Breast cancer Other        both breasts dx 65    Social History   Tobacco Use   Smoking status: Former    Packs/day: 0.75    Years: 42.00    Total pack years:  31.50    Types: Cigarettes    Quit date: 11/06/2020    Years since quitting: 0.9    Passive exposure: Past   Smokeless tobacco: Never  Vaping Use   Vaping Use: Never used  Substance Use Topics   Alcohol use: Yes    Comment: Special Occasions   Drug use: Never     Allergies  Allergen Reactions   Hydrocodone-Acetaminophen Shortness Of Breath   Klonopin [Clonazepam] Swelling   Crestor [Rosuvastatin] Other (See Comments)    Myalgias   Morphine Sulfate Itching   Effexor [Venlafaxine] Other (See Comments)    Weight gain   Seasonal Ic [Cholestatin]     Pollen, sinus coughing etc.   Sulfa Antibiotics Diarrhea and Other (See Comments)   Vytorin [Ezetimibe-Simvastatin] Other (See Comments)    Myalgias    Review of Systems NEGATIVE UNLESS OTHERWISE INDICATED IN HPI      Objective:     BP 98/68 (BP Location: Right Arm)   Pulse 80   Temp 97.6 F (36.4 C) (Temporal)   Ht 5' 7.5" (1.715 m)    Wt 176 lb (79.8 kg)   SpO2 98%   BMI 27.16 kg/m   Wt Readings from Last 3 Encounters:  10/19/21 176 lb (79.8 kg)  09/24/21 176 lb (79.8 kg)  09/20/21 175 lb (79.4 kg)    BP Readings from Last 3 Encounters:  10/19/21 98/68  09/29/21 101/61  09/24/21 100/70     Physical Exam Vitals and nursing note reviewed.  Constitutional:      General: She is not in acute distress.    Appearance: Normal appearance. She is not ill-appearing.  HENT:     Head: Normocephalic and atraumatic.  Cardiovascular:     Rate and Rhythm: Normal rate and regular rhythm.     Pulses: Normal pulses.     Heart sounds: Normal heart sounds.  Pulmonary:     Effort: Pulmonary effort is normal.     Breath sounds: Normal breath sounds.  Abdominal:     General: Abdomen is flat. Bowel sounds are normal.     Palpations: Abdomen is soft.     Tenderness: There is no right CVA tenderness or left CVA tenderness.  Skin:    General: Skin is warm and dry.  Neurological:     General: No focal deficit present.     Mental Status: She is alert.  Psychiatric:        Mood and Affect: Mood normal.        Assessment & Plan:   Problem List Items Addressed This Visit   None Visit Diagnoses     Hematuria, unspecified type    -  Primary   Relevant Orders   POCT urinalysis dipstick (Completed)   Urine Culture   US Renal   Ambulatory referral to Urology   History of renal stone       Relevant Orders   US Renal   Ambulatory referral to Urology   Recurrent UTI       Relevant Orders   US Renal   Ambulatory referral to Urology      Plan: Push fluids Hold on antibiotic until culture report comes back Repeat Renal US  Referral to urology  Call sooner if any changes     Seymour Pavlak M Sekai Gitlin, PA-C

## 2021-10-19 NOTE — Patient Instructions (Signed)
Push fluids Hold on antibiotic until culture report comes back Repeat Renal US  Referral to urology  Call sooner if any changes

## 2021-10-20 ENCOUNTER — Ambulatory Visit
Admission: RE | Admit: 2021-10-20 | Discharge: 2021-10-20 | Disposition: A | Discharge status: Home / Self Care | Payer: Medicare Other | Source: Ambulatory Visit | Attending: Physician Assistant | Admitting: Physician Assistant

## 2021-10-20 ENCOUNTER — Encounter: Payer: Self-pay | Admitting: Physician Assistant

## 2021-10-20 DIAGNOSIS — R319 Hematuria, unspecified: Secondary | ICD-10-CM

## 2021-10-20 DIAGNOSIS — N390 Urinary tract infection, site not specified: Secondary | ICD-10-CM

## 2021-10-20 DIAGNOSIS — Z87442 Personal history of urinary calculi: Secondary | ICD-10-CM

## 2021-10-20 LAB — URINE CULTURE
MICRO NUMBER:: 13691468
SPECIMEN QUALITY:: ADEQUATE

## 2021-10-20 NOTE — Telephone Encounter (Signed)
Nesha with [employer] has called with clinical questions.  States she has faxed over the clinical questions as well.  States fax is due back by 2pm 7/27.   If questions can give call back at [phone].

## 2021-10-21 ENCOUNTER — Other Ambulatory Visit: Payer: Self-pay | Admitting: Physician Assistant

## 2021-10-21 DIAGNOSIS — R319 Hematuria, unspecified: Secondary | ICD-10-CM

## 2021-10-21 DIAGNOSIS — N390 Urinary tract infection, site not specified: Secondary | ICD-10-CM

## 2021-10-21 DIAGNOSIS — Z87442 Personal history of urinary calculi: Secondary | ICD-10-CM

## 2021-10-21 NOTE — Telephone Encounter (Signed)
I haven't received a fax or clinical questions regarding patient and appeal, just wanted to check with you to see if maybe you received them

## 2021-10-21 NOTE — Telephone Encounter (Signed)
Verlon Setting, CMA  You 17 minutes ago (12:44 PM)    I haven't received a fax or clinical questions regarding patient and appeal, just wanted to check with you to see if maybe you received them

## 2021-10-21 NOTE — Telephone Encounter (Signed)
Alyssa Deleon with [facility] requests to be called at ph# [phone] regarding appeal of denial for Prolia.

## 2021-10-21 NOTE — Telephone Encounter (Signed)
Spoke with pt and advised lab results and next steps, please see lab notes. Pt verbalized understanding

## 2021-10-21 NOTE — Telephone Encounter (Signed)
Called pt to advise please see lab note

## 2021-10-22 ENCOUNTER — Ambulatory Visit: Payer: Medicare Other | Admitting: Physical Therapy

## 2021-10-22 NOTE — Telephone Encounter (Signed)
Nesha with [employer] has called with clinical questions.  States she has faxed over the clinical questions as well.  States fax is due back by 2pm 7/27.   If questions can give call back at [phone]    [facility] and left VM requesting call back or fax with clinical questions.

## 2021-10-22 NOTE — Telephone Encounter (Signed)
Caller states: -approval for prolia -Approval number: [id number] -Within the next 3 days letters will go to [facility] location and patient.  No further information given

## 2021-10-22 NOTE — Telephone Encounter (Signed)
Spoke with [place] who is handling Prolia appeals and was advised she lvm for [facility] and asked her to send her the fax there since it was never received here

## 2021-10-26 NOTE — Telephone Encounter (Signed)
Appeal APPROVED  PA# [id number] Valid: 10/21/21-03/27/22

## 2021-10-26 NOTE — Telephone Encounter (Signed)
Pt ready for scheduling on or after 10/26/21  Out-of-pocket cost due at time of visit: $311  Primary: [facility] Medicare Adv Prolia co-insurance: 20% (approximately $276) Admin fee co-insurance: $35  Secondary: n/a Prolia co-insurance:  Admin fee co-insurance:   Deductible: does not apply  Prior Auth: APPROVED PA# [id number] Valid: 10/21/21-03/27/22  ** This summary of benefits is an estimation of the patient's out-of-pocket cost. Exact cost may vary based on individual plan coverage.

## 2021-10-29 ENCOUNTER — Ambulatory Visit: Payer: Medicare Other | Admitting: Physical Therapy

## 2021-11-01 ENCOUNTER — Other Ambulatory Visit: Payer: Medicare Other

## 2021-11-05 ENCOUNTER — Ambulatory Visit: Payer: Medicare Other | Admitting: Physical Therapy

## 2021-11-12 ENCOUNTER — Ambulatory Visit: Payer: Medicare Other | Admitting: Physical Therapy

## 2021-11-17 ENCOUNTER — Encounter: Payer: Self-pay | Admitting: Neurology

## 2021-11-19 ENCOUNTER — Ambulatory Visit: Payer: Medicare Other | Admitting: Physical Therapy

## 2021-11-23 ENCOUNTER — Other Ambulatory Visit: Payer: Self-pay | Admitting: Physician Assistant

## 2021-11-23 ENCOUNTER — Other Ambulatory Visit: Payer: Self-pay

## 2021-11-23 DIAGNOSIS — F063 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, unspecified: Secondary | ICD-10-CM

## 2021-11-23 DIAGNOSIS — R202 Paresthesia of skin: Secondary | ICD-10-CM

## 2021-11-23 NOTE — Telephone Encounter (Signed)
Last OV: 10/19/21  Next OV: 06/30/22  Last filled: 09/13/21  Quantity: 180 tablets

## 2021-11-24 ENCOUNTER — Ambulatory Visit
Admission: RE | Admit: 2021-11-24 | Discharge: 2021-11-24 | Disposition: A | Discharge status: Home / Self Care | Payer: Medicare Other | Source: Ambulatory Visit | Attending: Physician Assistant | Admitting: Physician Assistant

## 2021-11-24 DIAGNOSIS — N390 Urinary tract infection, site not specified: Secondary | ICD-10-CM

## 2021-11-24 DIAGNOSIS — R319 Hematuria, unspecified: Secondary | ICD-10-CM

## 2021-11-24 DIAGNOSIS — Z87442 Personal history of urinary calculi: Secondary | ICD-10-CM

## 2021-11-25 ENCOUNTER — Encounter: Payer: Self-pay | Admitting: Physician Assistant

## 2021-11-26 ENCOUNTER — Ambulatory Visit: Payer: Medicare Other | Admitting: Physical Therapy

## 2021-12-01 ENCOUNTER — Ambulatory Visit: Payer: Medicare Other | Admitting: Physician Assistant

## 2021-12-01 ENCOUNTER — Encounter: Payer: Self-pay | Admitting: Physician Assistant

## 2021-12-01 ENCOUNTER — Other Ambulatory Visit (INDEPENDENT_AMBULATORY_CARE_PROVIDER_SITE_OTHER): Payer: Medicare Other

## 2021-12-01 VITALS — BP 96/58 | HR 85 | Temp 97.9°F | Ht 67.5 in | Wt 180.0 lb

## 2021-12-01 DIAGNOSIS — R319 Hematuria, unspecified: Secondary | ICD-10-CM

## 2021-12-01 DIAGNOSIS — K8020 Calculus of gallbladder without cholecystitis without obstruction: Secondary | ICD-10-CM | POA: Diagnosis not present

## 2021-12-01 DIAGNOSIS — K838 Other specified diseases of biliary tract: Secondary | ICD-10-CM

## 2021-12-01 DIAGNOSIS — R2689 Other abnormalities of gait and mobility: Secondary | ICD-10-CM | POA: Diagnosis not present

## 2021-12-01 DIAGNOSIS — R296 Repeated falls: Secondary | ICD-10-CM

## 2021-12-01 DIAGNOSIS — N200 Calculus of kidney: Secondary | ICD-10-CM | POA: Diagnosis not present

## 2021-12-01 DIAGNOSIS — M8580 Other specified disorders of bone density and structure, unspecified site: Secondary | ICD-10-CM

## 2021-12-01 LAB — CBC WITH DIFFERENTIAL/PLATELET
Basophils Absolute: 0.1 10*3/uL (ref 0.0–0.1)
Basophils Relative: 1 % (ref 0.0–3.0)
Eosinophils Absolute: 0.2 10*3/uL (ref 0.0–0.7)
Eosinophils Relative: 2.9 % (ref 0.0–5.0)
HCT: 36.3 % (ref 36.0–46.0)
Hemoglobin: 11.9 g/dL — ABNORMAL LOW (ref 12.0–15.0)
Lymphocytes Relative: 26.3 % (ref 12.0–46.0)
Lymphs Abs: 1.7 10*3/uL (ref 0.7–4.0)
MCHC: 32.7 g/dL (ref 30.0–36.0)
MCV: 92.3 fl (ref 78.0–100.0)
Monocytes Absolute: 0.5 10*3/uL (ref 0.1–1.0)
Monocytes Relative: 7.6 % (ref 3.0–12.0)
Neutro Abs: 4.1 10*3/uL (ref 1.4–7.7)
Neutrophils Relative %: 62.2 % (ref 43.0–77.0)
Platelets: 208 10*3/uL (ref 150.0–400.0)
RBC: 3.93 Mil/uL (ref 3.87–5.11)
RDW: 13.7 % (ref 11.5–15.5)
WBC: 6.6 10*3/uL (ref 4.0–10.5)

## 2021-12-01 LAB — COMPREHENSIVE METABOLIC PANEL
ALT: 6 U/L (ref 0–35)
AST: 11 U/L (ref 0–37)
Albumin: 4 g/dL (ref 3.5–5.2)
Alkaline Phosphatase: 77 U/L (ref 39–117)
BUN: 14 mg/dL (ref 6–23)
CO2: 28 mEq/L (ref 19–32)
Calcium: 9.2 mg/dL (ref 8.4–10.5)
Chloride: 106 mEq/L (ref 96–112)
Creatinine, Ser: 0.85 mg/dL (ref 0.40–1.20)
GFR: 70.57 mL/min (ref >60.00)
Glucose, Bld: 91 mg/dL (ref 70–99)
Potassium: 4.3 mEq/L (ref 3.5–5.1)
Sodium: 142 mEq/L (ref 135–145)
Total Bilirubin: 0.7 mg/dL (ref 0.2–1.2)
Total Protein: 6.7 g/dL (ref 6.0–8.3)

## 2021-12-01 NOTE — Patient Instructions (Addendum)
Good to see you today.  Please schedule a lab appointment to check CBC and CMP.  I have placed a referral to urology for intermittent blood in urine and kidney stone.  You may also contact [facility] to schedule an appointment.  In regard to your gallstones, I have placed a referral to general surgeon Dr. Anne Hahn.  I have also placed a referral to our physical therapists at our office.  We need to set you up for Prolia injections.

## 2021-12-01 NOTE — Progress Notes (Signed)
Subjective:    Patient ID: Alyssa Deleon, female    DOB: 1955-05-28, 66 y.o.   MRN: [id number]  Chief Complaint  Patient presents with   Follow-up    Pt coming in to f/u for labs; pt was approved for Prolia injections, wants to discuss everything sent in [facility] today; pt is no longer having pain in right side of abdomen, still seeing traces of blood in urine at times but not all time.     HPI Patient is in today for discussion about CT results - renal study - initially scheduled for intermittent hematuria.  Patient reports that she is no longer having any pain in her abdomen, but still seeing trace amounts of blood in her urine intermittently.  She also wants to get set up for Prolia injections for osteopenia.  She has also been seeing neurology for her recurrent falls and balance issues; it was suggested that she get into some physical therapy and she would like referral for that today if possible.  She is here with her daughter, [name] today.    Past Medical History:  Diagnosis Date   Breast cancer ([place])    Cataract, left 09/29/2017   Chronic pain syndrome, on Neurontin 09/30/2017   Dry eye syndrome of both eyes, on Restasis 09/30/2017   Emphysema lung (HCC)    Family history of breast cancer    Family history of melanoma    Family history of ovarian cancer    Family history of prostate cancer    Family history of throat cancer    Frequent urinary tract infections    GAD (generalized anxiety disorder) 03/11/2015   Genital herpes    History of chicken pox    History of degenerative disc disease 10/27/2002   Knee osteonecrosis, 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   Major depression, recurrent, chronic ([place]), followed by Psych, on Wellbutrin, Cymbalta 09/30/2017   Mixed hyperlipidemia, on Zetia and Zocor 09/30/2017   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   MVP (mitral valve prolapse) 03/13/2015   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   Nuclear sclerosis 06/10/2013    Panic disorder, followed by Psych, on Xanax 09/30/2017   Personal history of breast cancer    Psoriasis    PTSD (post-traumatic stress disorder) 03/11/2015   Stroke ([place])    Total knee replacement status, 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   Vitamin D deficiency 09/30/2017    Past Surgical History:  Procedure Laterality Date   AUGMENTATION MAMMAPLASTY Bilateral    BREAST BIOPSY     MASTECTOMY Left 1995   has a tram flap   RECONSTRUCTION BREAST W/ TRAM FLAP Left 2003    Family History  Problem Relation Age of Onset   Cancer - Other Mother    Miscarriages / [place] Mother    Alzheimer's disease Mother    Breast cancer Mother    Skin cancer Mother    Cancer - Prostate Father        metastatic   Hyperlipidemia Father    Throat cancer Father    Cancer - Other Sister    Depression Sister    Hearing loss Sister    Hyperlipidemia Sister    Breast cancer Sister        both breasts   Skin cancer Sister    Cancer - Other Brother    Throat cancer Brother    Cancer - Other Sister    Breast cancer Sister 90   Breast cancer Maternal Grandmother    Alzheimer's disease Maternal  Grandfather    Ovarian cancer Paternal Grandmother    Heart attack Paternal Grandfather    Breast cancer Paternal Aunt    Prostate cancer Paternal Uncle    Breast cancer Other        both breasts dx 110    Social History   Tobacco Use   Smoking status: Former    Packs/day: 0.75    Years: 42.00    Total pack years: 31.50    Types: Cigarettes    Quit date: 11/06/2020    Years since quitting: 1.0    Passive exposure: Past   Smokeless tobacco: Never  Vaping Use   Vaping Use: Never used  Substance Use Topics   Alcohol use: Yes    Comment: Special Occasions   Drug use: Never     Allergies  Allergen Reactions   Hydrocodone-Acetaminophen Shortness Of Breath   Klonopin [Clonazepam] Swelling   Crestor [Rosuvastatin] Other (See Comments)    Myalgias   Morphine Sulfate Itching   Effexor [Venlafaxine] Other  (See Comments)    Weight gain   Seasonal Ic [Cholestatin]     Pollen, sinus coughing etc.   Sulfa Antibiotics Diarrhea and Other (See Comments)   Vytorin [Ezetimibe-Simvastatin] Other (See Comments)    Myalgias    Review of Systems NEGATIVE UNLESS OTHERWISE INDICATED IN HPI      Objective:     BP (!) 96/58 (BP Location: Left Arm)   Pulse 85   Temp 97.9 F (36.6 C) (Temporal)   Ht 5' 7.5" (1.715 m)   Wt 180 lb (81.6 kg)   SpO2 97%   BMI 27.78 kg/m   Wt Readings from Last 3 Encounters:  12/01/21 180 lb (81.6 kg)  10/19/21 176 lb (79.8 kg)  09/24/21 176 lb (79.8 kg)    BP Readings from Last 3 Encounters:  12/01/21 (!) 96/58  10/19/21 98/68  09/29/21 101/61     Physical Exam Vitals and nursing note reviewed.  Constitutional:      General: She is not in acute distress.    Appearance: Normal appearance. She is not ill-appearing.  HENT:     Head: Normocephalic and atraumatic.  Cardiovascular:     Rate and Rhythm: Normal rate and regular rhythm.     Pulses: Normal pulses.     Heart sounds: Normal heart sounds.  Pulmonary:     Effort: Pulmonary effort is normal.     Breath sounds: Normal breath sounds.  Abdominal:     General: Abdomen is flat. Bowel sounds are normal.     Palpations: Abdomen is soft.     Tenderness: There is abdominal tenderness (RUQ minimal pain with palpation noted). There is no right CVA tenderness or left CVA tenderness.  Skin:    General: Skin is warm and dry.  Neurological:     General: No focal deficit present.     Mental Status: She is alert.  Psychiatric:        Mood and Affect: Mood normal.        Assessment & Plan:  Left renal stone -     Ambulatory referral to Urology -     CBC with Differential/Platelet; Future -     Comprehensive metabolic panel; Future  Hematuria, unspecified type -     Ambulatory referral to Urology  Recurrent falls -     Ambulatory referral to Physical Therapy  Balance problems -     Ambulatory  referral to Physical Therapy  Gallstones -  Ambulatory referral to General Surgery -     CBC with Differential/Platelet; Future -     Comprehensive metabolic panel; Future  Common bile duct dilatation -     Ambulatory referral to General Surgery -     CBC with Differential/Platelet; Future -     Comprehensive metabolic panel; Future    I personally reviewed the impression from the CT renal stone study on 11/24/2021 with patient and her daughter today: IMPRESSION: 1. Interval enlargement of left extrarenal pelvis with similar appearing 7 mm left UPJ stone. 2. Moderate compression fracture at T12 with slight progression in loss of vertebral body height since CT from May of 2023. 3. Small gallstones. Borderline enlarged common bile duct, correlate with LFTs  Based off these findings, felt it necessary to follow-up with general surgeon and urology at this time.  Also plan to check CBC and CMP in office today.  We are going to get her set up for Prolia for her osteopenia.    Return in about 3 months (around 03/02/2022) for recheck .  This note was prepared with assistance of Systems analyst. Occasional wrong-word or sound-a-like substitutions may have occurred due to the inherent limitations of voice recognition software.     Tomia Enlow M Adonte Vanriper, PA-C

## 2021-12-06 ENCOUNTER — Telehealth: Payer: Self-pay | Admitting: Physician Assistant

## 2021-12-06 NOTE — Telephone Encounter (Signed)
Patient has called in stating she believes her kidney stone might be moving.  States she is having more pain.  Also states she has had a really bad headache for 2 days.  States it was so bad that she almost went to ED over the weekend.  States she was concerned about the headaches due to having 2 previous strokes.  States she did not want this to be a stroke.    I have sent patient over to nurse triage.  I have also advised patient to reach out to urologist about kidney stone.  Patient also requesting call back from [facility] in regard to referrals.

## 2021-12-06 NOTE — Telephone Encounter (Signed)
Returned pt call to advise pone number for [facility] where referral was sent, and because her mobile service wasn't great lost patient. Awaiting on patient to callback, or will try patient again tomorrow.

## 2021-12-06 NOTE — Telephone Encounter (Signed)
Patient Name: YARIMAR LAVIS [facility] Gender: Female DOB: 05-16-1955 Age: 66 Y 29 D Return Phone Number: [phone] (Primary) Address: City/ State/ Zip: [place]  [address] Client [facility] Client Site [place] [facility] Paediatric nurse, Freight forwarder- PA Contact Type Call Who Is Calling Patient / Member / Family / Caregiver Call Type Triage / Clinical Relationship To Patient Self Return Phone Number [phone] (Primary) Chief Complaint CHEST PAIN - pain, pressure, heaviness or tightness Reason for Call Symptomatic / Request for Health Information Initial Comment Patient having side pain near her kidney around her rib cage radiating from her side to her back. Has blood on tissue when she wipes after urinating. Translation No Nurse Assessment Nurse: Thad Ranger, RN, Denise Date/Time (Eastern Time): 12/06/2021 10:50:26 AM Confirm and document reason for call. If symptomatic, describe symptoms. ---Patient having side pain near her kidney around her rib cage radiating from her side to her back. Has blood on tissue when she wipes after urinating. Does the patient have any new or worsening symptoms? ---Yes Will a triage be completed? ---Yes Related visit to physician within the last 2 weeks? ---No Does the PT have any chronic conditions? (i.e. diabetes, asthma, this includes High risk factors for pregnancy, etc.) ---Yes List chronic conditions. ---CVA, Kidney stones Is this a behavioral health or substance abuse call? ---No Guidelines Guideline Title Affirmed Question Affirmed Notes Nurse Date/Time (Eastern Time) Flank Pain [1] SEVERE pain (e.g., excruciating, scale 8-10) AND [2] present > 1 hour Carmon, RN, Denise 12/06/2021 10:51:44 AM  Disp. Time Eilene Ghazi Time) Disposition Final User 12/06/2021 10:45:05 AM Send to Urgent Daron Offer, Lanette 12/06/2021 10:53:18 AM Go to ED Now Yes Thad Ranger, RN, Langley Gauss Final  Disposition 12/06/2021 10:53:18 AM Go to ED Now Yes Carmon, RN, Yevette Edwards Disagree/Comply Disagree Caller Understands Yes PreDisposition Call Doctor Care Advice Given Per Guideline GO TO ED NOW: ANOTHER ADULT SHOULD DRIVE: * It is better and safer if another adult drives instead of you. BRING MEDICINES: * Bring a list of your current medicines when you go to the Emergency Department (ER). CARE ADVICE given per Flank Pain (Adult) guideline. Referrals GO TO FACILITY REFUSED

## 2021-12-07 ENCOUNTER — Ambulatory Visit: Payer: Medicare Other

## 2021-12-07 NOTE — Telephone Encounter (Signed)
Triage note advised ED but pt advised during phone call was not going to ED on 12/06/21

## 2021-12-13 NOTE — Telephone Encounter (Signed)
VML for pt to call back and sch prolia shoot - OOP $311.   Leda Quail C

## 2021-12-15 ENCOUNTER — Other Ambulatory Visit: Payer: Self-pay | Admitting: Physician Assistant

## 2021-12-15 ENCOUNTER — Ambulatory Visit
Admission: RE | Admit: 2021-12-15 | Discharge: 2021-12-15 | Disposition: A | Discharge status: Home / Self Care | Payer: Medicare Other | Source: Ambulatory Visit | Attending: Physician Assistant | Admitting: Physician Assistant

## 2021-12-15 DIAGNOSIS — Z1231 Encounter for screening mammogram for malignant neoplasm of breast: Secondary | ICD-10-CM

## 2021-12-16 ENCOUNTER — Encounter: Payer: Self-pay | Admitting: Physician Assistant

## 2021-12-21 ENCOUNTER — Encounter: Admit: 2021-12-21 | Payer: PRIVATE HEALTH INSURANCE | Attending: Family | Primary: Internal Medicine

## 2022-01-03 ENCOUNTER — Ambulatory Visit: Payer: Medicare Other | Admitting: Neurology

## 2022-01-03 DIAGNOSIS — M5417 Radiculopathy, lumbosacral region: Secondary | ICD-10-CM

## 2022-01-03 DIAGNOSIS — R202 Paresthesia of skin: Secondary | ICD-10-CM

## 2022-01-03 DIAGNOSIS — G629 Polyneuropathy, unspecified: Secondary | ICD-10-CM

## 2022-01-03 NOTE — Procedures (Signed)
[facility] Neurology  [address], [address]  [place], [place] [address] Tel: [phone] Fax: [phone] Test Date:  01/03/2022  Patient: Alyssa Deleon DOB: [birth year] Physician: Kai Levins, MD  Sex: Female Height: 5' 7.5" Ref Phys: Metta Clines, DO  ID#: [id number]   Technician:    History: This is a 66 year old female with numbness in legs and gait instability.  NCV & EMG Findings: Extensive electrodiagnostic evaluation of the left and right lower limbs shows: Absent sural and superficial fibular sensory responses bilaterally. Absent fibular (EDB) and tibial (AH) motor responses bilaterally. Fibular (TA) motor response is within normal limits bilaterally. H reflex is absent bilaterally. Chronic motor axon loss changes without accompanying active denervation changes are seen in the left tibialis anterior, left medial head of the gastrocnemius, and right short head of the biceps femoris. Chronic motor axon loss changes with modest active denervation changes are seen in the right tibialis anterior and right medial head of the gastrocnemius muscles.  Impression: This is an abnormal evaluation of bilateral lower limbs. The findings are most consistent with the following: Evidence of a generalized sensorimotor polyneuropathy, axon loss in type, moderate to severe in degree electrically. Evidence of an overlapping right S1 intraspinal canal lesion (ie motor radiculopathy), mild in degree electrically. No electrodiagnostic evidence of a left motor radiculopathy.    ___________________________ Kai Levins, MD    Nerve Conduction Studies Motor Nerve Results    Latency Amplitude F-Lat Segment Distance CV Comment  Site (ms) Norm (mV) Norm (ms)  (cm) (m/s) Norm   Left Fibular (EDB) Motor  Ankle *NR  < 6.0 *NR  > 2.5        Bel fib head *NR - *NR -  Bel fib head-Ankle - *NR  > 40   Pop fossa *NR - *NR -  Pop fossa-Bel fib head - *NR -   Right Fibular (EDB) Motor  Ankle *NR   < 6.0 *NR  > 2.5        Bel fib head *NR - *NR -  Bel fib head-Ankle - *NR  > 40   Pop fossa *NR - *NR -  Pop fossa-Bel fib head - *NR -   Left Fibular (TA) Motor  Fib head 3.6  < 4.5 3.2  > 3.0        Pop fossa 5.2  < 6.7 3.2 -  Pop fossa-Fib head 8 50  > 40   Right Fibular (TA) Motor  Fib head 2.3  < 4.5 4.2  > 3.0        Pop fossa 4.2  < 6.7 3.1 -  Pop fossa-Fib head 9 47  > 40   Left Tibial (AH) Motor  Ankle *NR  < 6.0 *NR  > 4.0        Knee *NR - *NR -  Knee-Ankle - *NR  > 40   Right Tibial (AH) Motor  Ankle *NR  < 6.0 *NR  > 4.0        Knee *NR - *NR -  Knee-Ankle - *NR  > 40    Sensory Sites    Neg Peak Lat Amplitude (O-P) Segment Distance Velocity Comment  Site (ms) Norm (V) Norm  (cm) (ms)   Left Superficial Fibular Sensory  14 cm-Ankle *NR  < 4.6 *NR  > 3 14 cm-Ankle 14    Right Superficial Fibular Sensory  14 cm-Ankle *NR  < 4.6 *NR  > 3 14 cm-Ankle 14  Left Sural Sensory  Calf-Lat mall *NR  < 4.6 *NR  > 3 Calf-Lat mall 14    Right Sural Sensory  Calf-Lat mall *NR  < 4.6 *NR  > 3 Calf-Lat mall 14     H-Reflex Results    M-Lat H Lat H Neg Amp H-M Lat  Site (ms) (ms) Norm (mV) (ms)  Left Tibial H-Reflex  Pop fossa 6.8 NR  < 35.0 - -  Right Tibial H-Reflex  Pop fossa 6.8 NR  < 35.0 - -   Electromyography   Side Muscle Ins.Act Fibs Fasc Recrt Amp Dur Poly Activation Comment  Left Tib ant Nml Nml Nml *2- *1+ *1+ *1+ Nml N/A  Left Gastroc MH Nml Nml Nml *2- *2+ *2+ *1+ Nml N/A  Left Rectus fem Nml Nml Nml Nml Nml Nml Nml Nml N/A  Left Biceps fem SH Nml Nml Nml Nml Nml Nml Nml Nml N/A  Left Gluteus med Nml Nml Nml Nml Nml Nml Nml Nml N/A  Right Tib ant Nml *1+ Nml *2- *1+ *1+ *1+ Nml N/A  Right Gastroc MH Nml *1+ Nml *2- *1+ *1+ *1+ Nml N/A  Right Rectus fem Nml Nml Nml Nml Nml Nml Nml Nml N/A  Right Biceps fem SH Nml Nml Nml *1- *1+ *1+ *1+ Nml N/A  Right Gluteus med Nml Nml Nml Nml Nml Nml Nml Nml N/A      Waveforms:  Motor                Sensory           H-Reflex

## 2022-01-04 ENCOUNTER — Other Ambulatory Visit: Payer: Self-pay

## 2022-01-04 DIAGNOSIS — M5136 Other intervertebral disc degeneration, lumbar region: Secondary | ICD-10-CM

## 2022-01-04 DIAGNOSIS — Z8673 Personal history of transient ischemic attack (TIA), and cerebral infarction without residual deficits: Secondary | ICD-10-CM

## 2022-01-04 DIAGNOSIS — R296 Repeated falls: Secondary | ICD-10-CM

## 2022-01-04 DIAGNOSIS — G629 Polyneuropathy, unspecified: Secondary | ICD-10-CM

## 2022-01-04 DIAGNOSIS — R202 Paresthesia of skin: Secondary | ICD-10-CM

## 2022-01-04 DIAGNOSIS — M5417 Radiculopathy, lumbosacral region: Secondary | ICD-10-CM

## 2022-01-04 DIAGNOSIS — Z87898 Personal history of other specified conditions: Secondary | ICD-10-CM

## 2022-01-05 ENCOUNTER — Other Ambulatory Visit: Payer: Self-pay | Admitting: Urology

## 2022-01-11 ENCOUNTER — Telehealth: Payer: Self-pay | Admitting: Neurology

## 2022-01-11 NOTE — Telephone Encounter (Signed)
Patient advised of Lab EMG results of 01/04/22.   Patient has labs she need to go have done and we will go from.

## 2022-01-11 NOTE — Telephone Encounter (Signed)
Patient thinks someone called to let her know that she had neuropathy and she needs to know what to do about it please call

## 2022-01-14 ENCOUNTER — Other Ambulatory Visit (INDEPENDENT_AMBULATORY_CARE_PROVIDER_SITE_OTHER): Payer: Medicare Other

## 2022-01-14 DIAGNOSIS — R296 Repeated falls: Secondary | ICD-10-CM

## 2022-01-14 DIAGNOSIS — Z8673 Personal history of transient ischemic attack (TIA), and cerebral infarction without residual deficits: Secondary | ICD-10-CM

## 2022-01-14 DIAGNOSIS — R202 Paresthesia of skin: Secondary | ICD-10-CM

## 2022-01-14 DIAGNOSIS — M5136 Other intervertebral disc degeneration, lumbar region: Secondary | ICD-10-CM

## 2022-01-14 DIAGNOSIS — G629 Polyneuropathy, unspecified: Secondary | ICD-10-CM

## 2022-01-14 DIAGNOSIS — Z87898 Personal history of other specified conditions: Secondary | ICD-10-CM

## 2022-01-14 DIAGNOSIS — M5417 Radiculopathy, lumbosacral region: Secondary | ICD-10-CM

## 2022-01-14 LAB — TSH: TSH: 0.57 u[IU]/mL (ref 0.35–5.50)

## 2022-01-14 LAB — SEDIMENTATION RATE: Sed Rate: 17 mm/hr (ref 0–30)

## 2022-01-14 LAB — VITAMIN B12: Vitamin B-12: 882 pg/mL (ref 211–911)

## 2022-01-14 LAB — HEMOGLOBIN A1C: Hgb A1c MFr Bld: 5.6 % (ref 4.6–6.5)

## 2022-01-19 ENCOUNTER — Other Ambulatory Visit: Payer: Self-pay

## 2022-01-19 DIAGNOSIS — M5136 Other intervertebral disc degeneration, lumbar region: Secondary | ICD-10-CM

## 2022-01-19 DIAGNOSIS — M5417 Radiculopathy, lumbosacral region: Secondary | ICD-10-CM

## 2022-01-19 DIAGNOSIS — Z87898 Personal history of other specified conditions: Secondary | ICD-10-CM

## 2022-01-19 DIAGNOSIS — G629 Polyneuropathy, unspecified: Secondary | ICD-10-CM

## 2022-01-19 DIAGNOSIS — R202 Paresthesia of skin: Secondary | ICD-10-CM

## 2022-01-20 ENCOUNTER — Telehealth: Payer: Self-pay | Admitting: Neurology

## 2022-01-20 LAB — IMMUNOFIXATION ELECTROPHORESIS
IgG (Immunoglobin G), Serum: 694 mg/dL (ref 600–1540)
IgM, Serum: 45 mg/dL — ABNORMAL LOW (ref 50–300)
Immunoglobulin A: 120 mg/dL (ref 70–320)

## 2022-01-20 LAB — ANTI-NUCLEAR AB-TITER (ANA TITER): ANA Titer 1: 1:40 {titer} — ABNORMAL HIGH

## 2022-01-20 LAB — ANGIOTENSIN CONVERTING ENZYME: Angiotensin-Converting Enzyme: 29 U/L (ref 9–67)

## 2022-01-20 LAB — ANA: Anti Nuclear Antibody (ANA): POSITIVE — AB

## 2022-01-20 NOTE — Telephone Encounter (Signed)
Called see other note

## 2022-01-20 NOTE — Telephone Encounter (Signed)
Pt called in returning our call about her lab results

## 2022-01-21 ENCOUNTER — Other Ambulatory Visit (INDEPENDENT_AMBULATORY_CARE_PROVIDER_SITE_OTHER): Payer: Medicare Other

## 2022-01-21 DIAGNOSIS — M5136 Other intervertebral disc degeneration, lumbar region: Secondary | ICD-10-CM | POA: Diagnosis not present

## 2022-01-21 DIAGNOSIS — G629 Polyneuropathy, unspecified: Secondary | ICD-10-CM

## 2022-01-21 DIAGNOSIS — Z87898 Personal history of other specified conditions: Secondary | ICD-10-CM

## 2022-01-21 DIAGNOSIS — R202 Paresthesia of skin: Secondary | ICD-10-CM

## 2022-01-21 DIAGNOSIS — M5417 Radiculopathy, lumbosacral region: Secondary | ICD-10-CM

## 2022-01-22 LAB — EXTRACTABLE NUCLEAR ANTIGEN ANTIBODY
ENA RNP Ab: <0.2 AI (ref 0.0–0.9)
ENA SM Ab Ser-aCnc: <0.2 AI (ref 0.0–0.9)
ENA SSA (RO) Ab: <0.2 AI (ref 0.0–0.9)
ENA SSB (LA) Ab: <0.2 AI (ref 0.0–0.9)
Scleroderma (Scl-70) (ENA) Antibody, IgG: <0.2 AI (ref 0.0–0.9)
dsDNA Ab: <1 IU/mL (ref 0–9)

## 2022-01-24 ENCOUNTER — Other Ambulatory Visit: Payer: Self-pay | Admitting: Physician Assistant

## 2022-01-24 DIAGNOSIS — E782 Mixed hyperlipidemia: Secondary | ICD-10-CM

## 2022-01-26 HISTORY — PX: KIDNEY STONE SURGERY: SHX686

## 2022-01-28 ENCOUNTER — Ambulatory Visit: Payer: Medicare Other | Admitting: Physician Assistant

## 2022-01-28 ENCOUNTER — Encounter: Payer: Self-pay | Admitting: Physician Assistant

## 2022-01-28 VITALS — BP 112/84 | Temp 98.2°F | Ht 67.5 in | Wt 174.0 lb

## 2022-01-28 DIAGNOSIS — F411 Generalized anxiety disorder: Secondary | ICD-10-CM | POA: Diagnosis not present

## 2022-01-28 DIAGNOSIS — H04123 Dry eye syndrome of bilateral lacrimal glands: Secondary | ICD-10-CM

## 2022-01-28 DIAGNOSIS — R768 Other specified abnormal immunological findings in serum: Secondary | ICD-10-CM

## 2022-01-28 DIAGNOSIS — G629 Polyneuropathy, unspecified: Secondary | ICD-10-CM | POA: Diagnosis not present

## 2022-01-28 DIAGNOSIS — R296 Repeated falls: Secondary | ICD-10-CM | POA: Diagnosis not present

## 2022-01-28 NOTE — Patient Instructions (Addendum)
Rheumatoid test today via lab  Decrease Xanax dose to now 1.5 mg at night After one week of lowering Xanax, may decrease Gabapentin to 300 mg only  Call in this process if any side effects or concerns  Goal is to come off Gabapentin and be able to trial Lyrica for nerve pain  Referral to PT at our office  Please see Dr. Hester Mates - call for appointment for dry eyes  [facility] Address: [address] [place], [place] [address] Phone: [phone]

## 2022-01-28 NOTE — Progress Notes (Signed)
Subjective:    Patient ID: Alyssa Deleon, female    DOB: 18-Oct-1955, 66 y.o.   MRN: [id number]  Chief Complaint  Patient presents with   Follow-up    Pt in office to discuss appt with Dr Tomi Likens; pt says she has Neuropathy but not neurological; pt had positive ANA and wants to discuss what this means, wants to discuss if PCP will be willing to [place] for her and wanting to discuss; Pt also ready to start Prolia, office needs to order or pt pick up at pharmacy and bring to nurse visit;      HPI Patient is in today for follow-up and to discuss a few questions.   She does have neuropathy Numbness /tingling mostly in left leg and foot, sometimes both feet.  EMG test was 01/03/22  Initially put on gabapentin for pain in nerves in face after her stroke about 30 years ago.  Wean down Xanax to 1.5 mg every evening.   Past Medical History:  Diagnosis Date   Breast cancer ([place])    Cataract, left 09/29/2017   Chronic pain syndrome, on Neurontin 09/30/2017   Dry eye syndrome of both eyes, on Restasis 09/30/2017   Emphysema lung (HCC)    Family history of breast cancer    Family history of melanoma    Family history of ovarian cancer    Family history of prostate cancer    Family history of throat cancer    Frequent urinary tract infections    GAD (generalized anxiety disorder) 03/11/2015   Genital herpes    History of chicken pox    History of degenerative disc disease 10/27/2002   Knee osteonecrosis, 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   Major depression, recurrent, chronic ([place]), followed by Psych, on Wellbutrin, Cymbalta 09/30/2017   Mixed hyperlipidemia, on Zetia and Zocor 09/30/2017   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   MVP (mitral valve prolapse) 03/13/2015   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   Nuclear sclerosis 06/10/2013   Panic disorder, followed by Psych, on Xanax 09/30/2017   Personal history of breast cancer    Psoriasis    PTSD (post-traumatic stress  disorder) 03/11/2015   Stroke ([place])    Total knee replacement status, 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   Vitamin D deficiency 09/30/2017    Past Surgical History:  Procedure Laterality Date   AUGMENTATION MAMMAPLASTY Bilateral    BREAST BIOPSY     MASTECTOMY Left 1995   has a tram flap   RECONSTRUCTION BREAST W/ TRAM FLAP Left 2003    Family History  Problem Relation Age of Onset   Cancer - Other Mother    Miscarriages / [place] Mother    Alzheimer's disease Mother    Breast cancer Mother    Skin cancer Mother    Cancer - Prostate Father        metastatic   Hyperlipidemia Father    Throat cancer Father    Cancer - Other Sister    Depression Sister    Hearing loss Sister    Hyperlipidemia Sister    Breast cancer Sister        both breasts   Skin cancer Sister    Cancer - Other Brother    Throat cancer Brother    Cancer - Other Sister    Breast cancer Sister 2   Breast cancer Maternal Grandmother    Alzheimer's disease Maternal Grandfather    Ovarian cancer Paternal Grandmother    Heart attack Paternal Grandfather    Breast  cancer Paternal Aunt    Prostate cancer Paternal Uncle    Breast cancer Other        both breasts dx 21    Social History   Tobacco Use   Smoking status: Former    Packs/day: 0.75    Years: 42.00    Total pack years: 31.50    Types: Cigarettes    Quit date: 11/06/2020    Years since quitting: 1.2    Passive exposure: Past   Smokeless tobacco: Never  Vaping Use   Vaping Use: Never used  Substance Use Topics   Alcohol use: Yes    Comment: Special Occasions   Drug use: Never     Allergies  Allergen Reactions   Hydrocodone-Acetaminophen Shortness Of Breath   Klonopin [Clonazepam] Swelling   Crestor [Rosuvastatin] Other (See Comments)    Myalgias   Morphine Sulfate Itching   Effexor [Venlafaxine] Other (See Comments)    Weight gain   Seasonal Ic [Cholestatin]     Pollen, sinus coughing etc.   Sulfa Antibiotics Diarrhea and Other (See  Comments)   Vytorin [Ezetimibe-Simvastatin] Other (See Comments)    Myalgias    Review of Systems NEGATIVE UNLESS OTHERWISE INDICATED IN HPI      Objective:     BP 112/84 (BP Location: Right Arm)   Temp 98.2 F (36.8 C) (Temporal)   Ht 5' 7.5" (1.715 m)   Wt 174 lb (78.9 kg)   BMI 26.85 kg/m   Wt Readings from Last 3 Encounters:  01/28/22 174 lb (78.9 kg)  12/01/21 180 lb (81.6 kg)  10/19/21 176 lb (79.8 kg)    BP Readings from Last 3 Encounters:  01/28/22 112/84  12/01/21 (!) 96/58  10/19/21 98/68     Physical Exam     Assessment & Plan:  Recurrent falls -     Ambulatory referral to Physical Therapy  Neuropathy  Positive ANA (antinuclear antibody) -     Rheumatoid factor  GAD (generalized anxiety disorder)  Dry eye syndrome of both eyes, on Restasis        Return in about 4 weeks (around 02/25/2022) for recheck / med check .  This note was prepared with assistance of Systems analyst. Occasional wrong-word or sound-a-like substitutions may have occurred due to the inherent limitations of voice recognition software.  Time Spent: 44 minutes of total time was spent on the date of the encounter performing the following actions: chart review prior to seeing the patient, obtaining history, performing a medically necessary exam, counseling on the treatment plan, placing orders, and documenting in our EHR.       Kadeshia Kasparian M Alexia Dinger, PA-C

## 2022-01-29 LAB — RHEUMATOID FACTOR: Rheumatoid fact SerPl-aCnc: 30 IU/mL — ABNORMAL HIGH (ref <14)

## 2022-01-29 NOTE — Assessment & Plan Note (Signed)
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  She has been on gabapentin for nearly 30 years she says.  Does not feel like this has been very helpful.  We can try to wean off this medication and try Lyrica in place of this.  AVS provided about how to continue to wean off medication.

## 2022-01-29 NOTE — Assessment & Plan Note (Signed)
Positive ANA via recent blood work.  Rheumatoid factor was not drawn, we will draw this today.

## 2022-01-29 NOTE — Assessment & Plan Note (Signed)
Worse in Left eye s/p CVA, chronic. She may benefit from newer medication regimen. Gave her name of Dr. Valma Cava to see at [facility].

## 2022-01-29 NOTE — Assessment & Plan Note (Signed)
She has been on Xanax nearly 30 years she says.  We have continued to try to work on weaning her off this medication.  She is currently at 2 mg every night.  She will also take an additional Xanax as needed for rare panic attacks. Continue to work on weaning process. Will adjust to take Xanax 1.5 mg in the evenings. She will call if any concerns.

## 2022-01-29 NOTE — Assessment & Plan Note (Signed)
Likely secondary to neuropathy in left leg.  EMG and labs with neurology reviewed.  I do think she would benefit from PT at this time.  Referral placed.

## 2022-01-31 ENCOUNTER — Other Ambulatory Visit: Payer: Self-pay

## 2022-01-31 DIAGNOSIS — R768 Other specified abnormal immunological findings in serum: Secondary | ICD-10-CM

## 2022-02-14 ENCOUNTER — Encounter: Payer: Self-pay | Admitting: Physician Assistant

## 2022-02-14 ENCOUNTER — Other Ambulatory Visit (HOSPITAL_COMMUNITY): Payer: Self-pay

## 2022-02-14 NOTE — Telephone Encounter (Signed)
Pharmacy Patient Advocate Encounter  Insurance verification completed.    The patient is insured through [employer]   Ran test claims for: Prolia '60mg'$ .  Pharmacy benefit copay: $150.00

## 2022-02-14 NOTE — Telephone Encounter (Signed)
Forwarding to Rx prior auth team.

## 2022-02-14 NOTE — Telephone Encounter (Signed)
Please see patient msg and advise. All appointments in December were cancelled at patient request.

## 2022-02-15 NOTE — Telephone Encounter (Signed)
Patient daughter Jovita Kussmaul is listed on patient DPR phone number [phone]. You mentioned calling her this morning, please advise if you want me to call her or do you want to call and discuss patient msg and concerns with her. Thanks

## 2022-02-21 ENCOUNTER — Encounter: Admit: 2022-02-21 | Payer: PRIVATE HEALTH INSURANCE | Attending: Family | Primary: Internal Medicine

## 2022-02-21 NOTE — Telephone Encounter (Signed)
Prolia is cheaper through patient's pharmacy benefit. Please send rx to [facility] and they can ship medication to the clinic.

## 2022-02-21 NOTE — Telephone Encounter (Signed)
Please see pt response, rescheduling appts when she comes back in town per [employer]

## 2022-02-21 NOTE — Telephone Encounter (Signed)
Please advise next steps to take?

## 2022-02-22 ENCOUNTER — Other Ambulatory Visit: Payer: Self-pay

## 2022-02-22 ENCOUNTER — Other Ambulatory Visit (HOSPITAL_COMMUNITY): Payer: Self-pay

## 2022-02-22 ENCOUNTER — Other Ambulatory Visit: Payer: Self-pay | Admitting: Physician Assistant

## 2022-02-22 MED ORDER — DENOSUMAB 60 MG/ML ~~LOC~~ SOSY
60.0000 mg | PREFILLED_SYRINGE | Freq: Once | SUBCUTANEOUS | 0 refills | Status: DC
  Filled 2022-02-22 (×2): qty 1, 1d supply, fill #0
  Filled 2022-03-04: qty 1, 180d supply, fill #0

## 2022-02-22 NOTE — Telephone Encounter (Signed)
[place] to get costs of Prolia for patient and was advised $150.00 and will be sending to the office on 03/07/22 for patients 03/10/22 appt with provider.. Patient called and advised cost and needing to contact pharmacy to pay copay for injection and scheduled the appt for patient to come in office to see PCP on 03/10/22. Pt verbalized understanding

## 2022-02-25 ENCOUNTER — Ambulatory Visit: Payer: Medicare Other | Admitting: Physician Assistant

## 2022-03-01 ENCOUNTER — Ambulatory Visit: Payer: Medicare Other | Admitting: Physician Assistant

## 2022-03-02 ENCOUNTER — Other Ambulatory Visit (HOSPITAL_BASED_OUTPATIENT_CLINIC_OR_DEPARTMENT_OTHER): Payer: Self-pay

## 2022-03-03 ENCOUNTER — Other Ambulatory Visit (HOSPITAL_COMMUNITY): Payer: Self-pay

## 2022-03-04 ENCOUNTER — Other Ambulatory Visit (HOSPITAL_COMMUNITY): Payer: Self-pay

## 2022-03-07 ENCOUNTER — Other Ambulatory Visit (HOSPITAL_COMMUNITY): Payer: Self-pay

## 2022-03-07 ENCOUNTER — Other Ambulatory Visit: Payer: Self-pay

## 2022-03-10 ENCOUNTER — Ambulatory Visit: Payer: Medicare Other | Admitting: Physician Assistant

## 2022-03-10 ENCOUNTER — Encounter: Payer: Self-pay | Admitting: Physician Assistant

## 2022-03-10 ENCOUNTER — Other Ambulatory Visit (HOSPITAL_BASED_OUTPATIENT_CLINIC_OR_DEPARTMENT_OTHER): Payer: Self-pay

## 2022-03-10 VITALS — BP 110/74 | HR 90 | Temp 98.0°F | Ht 67.5 in | Wt 179.4 lb

## 2022-03-10 DIAGNOSIS — M810 Age-related osteoporosis without current pathological fracture: Secondary | ICD-10-CM

## 2022-03-10 DIAGNOSIS — G629 Polyneuropathy, unspecified: Secondary | ICD-10-CM | POA: Diagnosis not present

## 2022-03-10 DIAGNOSIS — F411 Generalized anxiety disorder: Secondary | ICD-10-CM

## 2022-03-10 MED ORDER — AMITRIPTYLINE HCL 25 MG PO TABS
25.0000 mg | ORAL_TABLET | Freq: Every day | ORAL | 2 refills | Status: DC
  Filled 2022-03-10: qty 30, 30d supply, fill #0

## 2022-03-10 MED ORDER — DENOSUMAB 60 MG/ML ~~LOC~~ SOSY
60.0000 mg | PREFILLED_SYRINGE | Freq: Once | SUBCUTANEOUS | Status: AC
  Administered 2022-03-10: 60 mg via SUBCUTANEOUS

## 2022-03-10 NOTE — Progress Notes (Signed)
Subjective:    Patient ID: Alyssa Deleon, female    DOB: February 19, 1956, 66 y.o.   MRN: [id number]  Chief Complaint  Patient presents with   Follow-up    HPI Patient is in today for f/up from visit on 01/28/22. She is taking Xanax 1.5 mg at bedtime, takes her about an hour to fall asleep. During the daytime, takes Wellbutrin and Cymbalta, feels like her depression is ok.  "If I had the money I wouldn't be depressed."   She hasn't seen rheumatology yet. States she will make appt for the beginning of the year.   Still having a lot of neuropathic pain and this makes her sleep difficult.  Past Medical History:  Diagnosis Date   Breast cancer ([place])    Cataract, left 09/29/2017   Chronic pain syndrome, on Neurontin 09/30/2017   Dry eye syndrome of both eyes, on Restasis 09/30/2017   Emphysema lung ([place])    Family history of breast cancer    Family history of melanoma    Family history of ovarian cancer    Family history of prostate cancer    Family history of throat cancer    Frequent urinary tract infections    GAD (generalized anxiety disorder) 03/11/2015   Genital herpes    History of chicken pox    History of degenerative disc disease 10/27/2002   Knee osteonecrosis, 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   Major depression, recurrent, chronic ([place]), followed by Psych, on Wellbutrin, Cymbalta 09/30/2017   Mixed hyperlipidemia, on Zetia and Zocor 09/30/2017   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   MVP (mitral valve prolapse) 03/13/2015   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   Nuclear sclerosis 06/10/2013   Panic disorder, followed by Psych, on Xanax 09/30/2017   Personal history of breast cancer    Psoriasis    PTSD (post-traumatic stress disorder) 03/11/2015   Stroke ([place])    Total knee replacement status, 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   Vitamin D deficiency 09/30/2017    Past Surgical History:  Procedure Laterality Date   AUGMENTATION MAMMAPLASTY Bilateral    BREAST BIOPSY      MASTECTOMY Left 1995   has a tram flap   RECONSTRUCTION BREAST W/ TRAM FLAP Left 2003    Family History  Problem Relation Age of Onset   Cancer - Other Mother    Miscarriages / [place] Mother    Alzheimer's disease Mother    Breast cancer Mother    Skin cancer Mother    Cancer - Prostate Father        metastatic   Hyperlipidemia Father    Throat cancer Father    Cancer - Other Sister    Depression Sister    Hearing loss Sister    Hyperlipidemia Sister    Breast cancer Sister        both breasts   Skin cancer Sister    Cancer - Other Brother    Throat cancer Brother    Cancer - Other Sister    Breast cancer Sister 57   Breast cancer Maternal Grandmother    Alzheimer's disease Maternal Grandfather    Ovarian cancer Paternal Grandmother    Heart attack Paternal Grandfather    Breast cancer Paternal Aunt    Prostate cancer Paternal Uncle    Breast cancer Other        both breasts dx 87    Social History   Tobacco Use   Smoking status: Former    Packs/day: 0.75    Years:  42.00    Total pack years: 31.50    Types: Cigarettes    Quit date: 11/06/2020    Years since quitting: 1.3    Passive exposure: Past   Smokeless tobacco: Never  Vaping Use   Vaping Use: Never used  Substance Use Topics   Alcohol use: Yes    Comment: Special Occasions   Drug use: Never     Allergies  Allergen Reactions   Hydrocodone-Acetaminophen Shortness Of Breath   Klonopin [Clonazepam] Swelling   Crestor [Rosuvastatin] Other (See Comments)    Myalgias   Morphine Sulfate Itching   Effexor [Venlafaxine] Other (See Comments)    Weight gain   Seasonal Ic [Cholestatin]     Pollen, sinus coughing etc.   Sulfa Antibiotics Diarrhea and Other (See Comments)   Vytorin [Ezetimibe-Simvastatin] Other (See Comments)    Myalgias    Review of Systems NEGATIVE UNLESS OTHERWISE INDICATED IN HPI      Objective:     BP 110/74 (BP Location: Right Arm, Patient Position: Sitting)    Pulse 90   Temp 98 F (36.7 C) (Temporal)   Ht 5' 7.5" (1.715 m)   Wt 179 lb 6.4 oz (81.4 kg)   SpO2 98%   BMI 27.68 kg/m   Wt Readings from Last 3 Encounters:  03/10/22 179 lb 6.4 oz (81.4 kg)  01/28/22 174 lb (78.9 kg)  12/01/21 180 lb (81.6 kg)    BP Readings from Last 3 Encounters:  03/10/22 110/74  01/28/22 112/84  12/01/21 (!) 96/58     Physical Exam Vitals and nursing note reviewed.  Constitutional:      Appearance: Normal appearance.  Cardiovascular:     Rate and Rhythm: Normal rate and regular rhythm.     Pulses: Normal pulses.     Heart sounds: No murmur heard. Pulmonary:     Effort: Pulmonary effort is normal.     Breath sounds: Normal breath sounds.  Neurological:     Mental Status: She is alert and oriented to person, place, and time.  Psychiatric:        Mood and Affect: Mood is anxious. Mood is not depressed.        Assessment & Plan:  GAD (generalized anxiety disorder)  Neuropathy  Age-related osteoporosis without current pathological fracture -     Denosumab  Other orders -     Amitriptyline HCl; Take 1 tablet (25 mg total) by mouth at bedtime.  Dispense: 30 tablet; Refill: 2    You were given your first Prolia injection today.  Continue on Xanax 1.5 mg in the evenings.  We will slowly continue to taper, but no changes on this medicine until next visit.  Try amitriptyline 25 mg at bedtime to help with sleep, anxiety, neuropathic pain.  Start with 1/2 tablet x 1 week and then you may increase to a full tablet after that if tolerating well.  Pt aware of risks vs benefits and possible adverse reactions. She will call if any concerns.     Return in about 3 months (around 06/09/2022) for recheck .  This note was prepared with assistance of Systems analyst. Occasional wrong-word or sound-a-like substitutions may have occurred due to the inherent limitations of voice recognition software.    Naraya Stoneberg M Wake Conlee, PA-C

## 2022-03-10 NOTE — Patient Instructions (Addendum)
Good to see you today.  You were given your first Prolia injection today.  Continue on Xanax 1.5 mg in the evenings.  We will slowly continue to taper, but no changes on this medicine until next visit.  Try amitriptyline 25 mg at bedtime to help with sleep, anxiety, neuropathic pain.  Start with 1/2 tablet x 1 week and then you may increase to a full tablet after that if tolerating well.  You know to call sooner if any concerns or questions.  Happy holidays to your family.

## 2022-03-15 ENCOUNTER — Ambulatory Visit: Payer: Medicare Other

## 2022-03-17 ENCOUNTER — Other Ambulatory Visit: Payer: Self-pay | Admitting: Physician Assistant

## 2022-03-17 DIAGNOSIS — F063 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, unspecified: Secondary | ICD-10-CM

## 2022-03-17 DIAGNOSIS — E782 Mixed hyperlipidemia: Secondary | ICD-10-CM

## 2022-03-23 ENCOUNTER — Ambulatory Visit: Payer: Medicare Other | Admitting: Neurology

## 2022-03-29 ENCOUNTER — Telehealth: Payer: Self-pay | Admitting: Physician Assistant

## 2022-03-29 NOTE — Telephone Encounter (Signed)
LVM for pt to return my call.

## 2022-03-29 NOTE — Telephone Encounter (Signed)
Would like a call back for some results

## 2022-03-30 NOTE — Telephone Encounter (Signed)
Returned pt call and advised she didn't need results, she was wanting to schedule Mychart visit to discuss new medication and side effects. Scheduled for patient and pt aware of appt info

## 2022-04-05 ENCOUNTER — Encounter: Payer: Self-pay | Admitting: Physician Assistant

## 2022-04-05 ENCOUNTER — Telehealth (INDEPENDENT_AMBULATORY_CARE_PROVIDER_SITE_OTHER): Payer: Medicare Other | Admitting: Physician Assistant

## 2022-04-05 VITALS — Ht 67.5 in | Wt 179.0 lb

## 2022-04-05 DIAGNOSIS — R197 Diarrhea, unspecified: Secondary | ICD-10-CM | POA: Diagnosis not present

## 2022-04-05 NOTE — Progress Notes (Unsigned)
   Virtual Visit via Video Note  I connected with  Alyssa Deleon  on 04/05/22 at 11:30 AM EST by a video enabled telemedicine application and verified that I am speaking with the correct person using two identifiers.  Location: Patient: home Provider: Therapist, music at [place] present: Patient and myself   I discussed the limitations of evaluation and management by telemedicine and the availability of in person appointments. The patient expressed understanding and agreed to proceed.   History of Present Illness:  Persistent loose stools and some nausea for at least the last 3-4 weeks. Strong odor. Occasionally red or yellow in color. Feels like she has to force herself to eat. Worst day was after eating corn-casserole - went about 12 times that day.    Observations/Objective:   Gen: Awake, alert, no acute distress Resp: Breathing is even and non-labored Psych: calm/pleasant demeanor Neuro: Alert and Oriented x 3, + facial symmetry, speech is clear.   Assessment and Plan:   Follow Up Instructions:    I discussed the assessment and treatment plan with the patient. The patient was provided an opportunity to ask questions and all were answered. The patient agreed with the plan and demonstrated an understanding of the instructions.   The patient was advised to call back or seek an in-person evaluation if the symptoms worsen or if the condition fails to improve as anticipated.  Raymont Andreoni M Marlin Jarrard, PA-C

## 2022-04-07 ENCOUNTER — Other Ambulatory Visit: Payer: Medicare Other

## 2022-04-07 ENCOUNTER — Telehealth: Payer: Self-pay | Admitting: Physician Assistant

## 2022-04-07 ENCOUNTER — Other Ambulatory Visit: Payer: Self-pay | Admitting: Physician Assistant

## 2022-04-07 ENCOUNTER — Other Ambulatory Visit: Payer: Self-pay

## 2022-04-07 DIAGNOSIS — E782 Mixed hyperlipidemia: Secondary | ICD-10-CM

## 2022-04-07 DIAGNOSIS — R197 Diarrhea, unspecified: Secondary | ICD-10-CM

## 2022-04-07 MED ORDER — EZETIMIBE 10 MG PO TABS: 10.0000 mg | ORAL_TABLET | Freq: Every day | ORAL | 3 refills | Status: DC

## 2022-04-07 NOTE — Telephone Encounter (Signed)
Rx for generic of Zetia was sent to pharmacy as requested and approved by provider. Pt has been notified as well

## 2022-04-07 NOTE — Telephone Encounter (Signed)
Please advise 

## 2022-04-07 NOTE — Telephone Encounter (Signed)
Patient cannot pay $100 for zetia 10 mg - patient wants it changed to generic brand Ezetimible as she will pay $16.85.

## 2022-04-08 LAB — OVA AND PARASITE EXAMINATION
CONCENTRATE RESULT:: NONE SEEN
MICRO NUMBER:: 14419067
SPECIMEN QUALITY:: ADEQUATE
TRICHROME RESULT:: NONE SEEN

## 2022-04-11 ENCOUNTER — Encounter: Payer: Self-pay | Admitting: Physician Assistant

## 2022-04-11 LAB — GI PROFILE, STOOL, PCR

## 2022-04-13 ENCOUNTER — Ambulatory Visit: Payer: Medicare Other | Admitting: Physician Assistant

## 2022-04-13 ENCOUNTER — Other Ambulatory Visit: Payer: Self-pay

## 2022-04-13 ENCOUNTER — Emergency Department (HOSPITAL_BASED_OUTPATIENT_CLINIC_OR_DEPARTMENT_OTHER)
Admission: EM | Admit: 2022-04-13 | Discharge: 2022-04-13 | Disposition: A | Discharge status: Home / Self Care | Payer: Medicare Other | Attending: Emergency Medicine | Admitting: Emergency Medicine

## 2022-04-13 ENCOUNTER — Telehealth: Payer: Self-pay | Admitting: Internal Medicine

## 2022-04-13 ENCOUNTER — Encounter: Payer: Self-pay | Admitting: Physician Assistant

## 2022-04-13 ENCOUNTER — Encounter (HOSPITAL_BASED_OUTPATIENT_CLINIC_OR_DEPARTMENT_OTHER): Payer: Self-pay | Admitting: Emergency Medicine

## 2022-04-13 ENCOUNTER — Telehealth: Payer: Self-pay

## 2022-04-13 VITALS — BP 110/56 | HR 91 | Temp 98.0°F | Ht 67.5 in | Wt 181.2 lb

## 2022-04-13 DIAGNOSIS — R799 Abnormal finding of blood chemistry, unspecified: Secondary | ICD-10-CM | POA: Insufficient documentation

## 2022-04-13 DIAGNOSIS — R197 Diarrhea, unspecified: Secondary | ICD-10-CM

## 2022-04-13 DIAGNOSIS — K921 Melena: Secondary | ICD-10-CM

## 2022-04-13 DIAGNOSIS — R899 Unspecified abnormal finding in specimens from other organs, systems and tissues: Secondary | ICD-10-CM

## 2022-04-13 HISTORY — DX: Calculus of kidney: N20.0

## 2022-04-13 LAB — CBC WITH DIFFERENTIAL/PLATELET
Abs Immature Granulocytes: 0.02 10*3/uL (ref 0.00–0.07)
Basophils Absolute: 0 10*3/uL (ref 0.0–0.1)
Basophils Absolute: 0 10*3/uL (ref 0.0–0.1)
Basophils Relative: 0.6 % (ref 0.0–3.0)
Basophils Relative: 0 %
Eosinophils Absolute: 0.2 10*3/uL (ref 0.0–0.7)
Eosinophils Absolute: 0.2 10*3/uL (ref 0.0–0.5)
Eosinophils Relative: 3 % (ref 0.0–5.0)
Eosinophils Relative: 3 %
HCT: 39.4 % (ref 36.0–46.0)
HCT: 38 % (ref 36.0–46.0)
Hemoglobin: 12.9 g/dL (ref 12.0–15.0)
Hemoglobin: 12.1 g/dL (ref 12.0–15.0)
Immature Granulocytes: 0 %
Lymphocytes Relative: 28.1 % (ref 12.0–46.0)
Lymphocytes Relative: 30 %
Lymphs Abs: 1.9 10*3/uL (ref 0.7–4.0)
Lymphs Abs: 2.1 10*3/uL (ref 0.7–4.0)
MCH: 30.1 pg (ref 26.0–34.0)
MCHC: 32.6 g/dL (ref 30.0–36.0)
MCHC: 31.8 g/dL (ref 30.0–36.0)
MCV: 92.4 fl (ref 78.0–100.0)
MCV: 94.5 fL (ref 80.0–100.0)
Monocytes Absolute: 0.6 10*3/uL (ref 0.1–1.0)
Monocytes Absolute: 0.5 10*3/uL (ref 0.1–1.0)
Monocytes Relative: 8.4 % (ref 3.0–12.0)
Monocytes Relative: 7 %
Neutro Abs: 4.1 10*3/uL (ref 1.4–7.7)
Neutro Abs: 4.2 10*3/uL (ref 1.7–7.7)
Neutrophils Relative %: 59.9 % (ref 43.0–77.0)
Neutrophils Relative %: 60 %
Platelets: 264 10*3/uL (ref 150.0–400.0)
Platelets: 247 10*3/uL (ref 150–400)
RBC: 4.27 Mil/uL (ref 3.87–5.11)
RBC: 4.02 MIL/uL (ref 3.87–5.11)
RDW: 13.2 % (ref 11.5–15.5)
RDW: 12.7 % (ref 11.5–15.5)
WBC: 6.9 10*3/uL (ref 4.0–10.5)
WBC: 7.1 10*3/uL (ref 4.0–10.5)
nRBC: 0 % (ref 0.0–0.2)

## 2022-04-13 LAB — COMPREHENSIVE METABOLIC PANEL
ALT: 7 U/L (ref 0–35)
ALT: 6 U/L (ref 0–44)
AST: 13 U/L (ref 0–37)
AST: 12 U/L — ABNORMAL LOW (ref 15–41)
Albumin: 4.5 g/dL (ref 3.5–5.2)
Albumin: 4.4 g/dL (ref 3.5–5.0)
Alkaline Phosphatase: 81 U/L (ref 39–117)
Alkaline Phosphatase: 69 U/L (ref 38–126)
Anion gap: 9 (ref 5–15)
BUN: 14 mg/dL (ref 6–23)
BUN: 15 mg/dL (ref 8–23)
CO2: 31 mEq/L (ref 19–32)
CO2: 31 mmol/L (ref 22–32)
Calcium: 9.9 mg/dL (ref 8.4–10.5)
Calcium: 9.9 mg/dL (ref 8.9–10.3)
Chloride: 105 mEq/L (ref 96–112)
Chloride: 103 mmol/L (ref 98–111)
Creatinine, Ser: 1 mg/dL (ref 0.40–1.20)
Creatinine, Ser: 0.91 mg/dL (ref 0.44–1.00)
GFR, Estimated: >60 mL/min (ref >60)
GFR: 57.92 mL/min — ABNORMAL LOW (ref >60.00)
Glucose, Bld: 102 mg/dL — ABNORMAL HIGH (ref 70–99)
Glucose, Bld: 102 mg/dL — ABNORMAL HIGH (ref 70–99)
Potassium: 6.4 mEq/L (ref 3.5–5.1)
Potassium: 4.5 mmol/L (ref 3.5–5.1)
Sodium: 146 mEq/L — ABNORMAL HIGH (ref 135–145)
Sodium: 143 mmol/L (ref 135–145)
Total Bilirubin: 0.6 mg/dL (ref 0.2–1.2)
Total Bilirubin: 0.5 mg/dL (ref 0.3–1.2)
Total Protein: 7 g/dL (ref 6.0–8.3)
Total Protein: 7 g/dL (ref 6.5–8.1)

## 2022-04-13 LAB — SEDIMENTATION RATE: Sed Rate: 11 mm/hr (ref 0–30)

## 2022-04-13 LAB — C-REACTIVE PROTEIN: CRP: <1 mg/dL (ref 0.5–20.0)

## 2022-04-13 LAB — LIPASE: Lipase: 25 U/L (ref 11.0–59.0)

## 2022-04-13 MED ORDER — SODIUM CHLORIDE 0.9 % IV BOLUS: 1000.0000 mL | Freq: Once | INTRAVENOUS | Status: DC

## 2022-04-13 MED ORDER — SODIUM CHLORIDE 0.9 % IV BOLUS
500.0000 mL | Freq: Once | INTRAVENOUS | Status: AC
  Administered 2022-04-13: 500 mL via INTRAVENOUS

## 2022-04-13 NOTE — ED Provider Notes (Signed)
[facility] EMERGENCY DEPT Provider Note   CSN: [id number] Arrival date & time: 04/13/22  1749     History  Chief Complaint  Patient presents with   Abnormal Lab    Alyssa Deleon is a 67 y.o. female.   Abnormal Lab   66 year old female presents emergency department with complaints of abnormal lab.  Patient reports 3 to 4-week history of diarrhea after starting amitriptyline but persisted after cessation of medicine.  Was seen by primary care provider again earlier today with with lab draws due to persistent diarrhea of which showed elevated potassium of 6.4.  Patient was then prompted to come the emergency department for further evaluation.  Patient states that diarrhea has ceased as of 2 days ago..  She notes having had bright red blood per rectum when wiping without gross hematochezia.  Currently denies chest pain, shortness of breath, abdominal pain, nausea, vomiting, urinary/vaginal symptoms.  Has had no new medications started or recent changes in diet.  Past medical history significant for chronic pain syndrome, major depressive disorder, hyperlipidemia, generalized anxiety disorder, PTSD, breast malignancy, emphysema, CVA  Home Medications Prior to Admission medications   Medication Sig Start Date End Date Taking? Authorizing Provider  ALPRAZolam Duanne Moron) 1 MG tablet TAKE 2 TABLETS BY MOUTH EVERY  NIGHT AT BEDTIME AS NEEDED FOR  ANXIETY 12/12/21   Allwardt, Randa Evens, PA-C  aspirin EC 81 MG tablet Take 1 tablet (81 mg total) by mouth daily. Swallow whole. 05/31/21   Belva Crome, MD  buPROPion (WELLBUTRIN XL) 300 MG 24 hr tablet TAKE 1 TABLET BY MOUTH DAILY 03/18/22   Allwardt, Randa Evens, PA-C  DULoxetine (CYMBALTA) 60 MG capsule TAKE 1 CAPSULE BY MOUTH DAILY 03/18/22   Allwardt, Randa Evens, PA-C  ezetimibe (ZETIA) 10 MG tablet Take 1 tablet (10 mg total) by mouth daily. 04/07/22   Allwardt, Randa Evens, PA-C  simvastatin (ZOCOR) 40 MG tablet TAKE 1 TABLET BY MOUTH  DAILY 01/24/22   Allwardt, Alyssa M, PA-C  TYRVAYA 0.03 MG/ACT SOLN Place 1 spray into both nostrils 2 (two) times daily. 02/22/22   [provider]      Allergies    Hydrocodone-acetaminophen, Klonopin [clonazepam], Crestor [rosuvastatin], Morphine sulfate, Effexor [venlafaxine], Seasonal ic [cholestatin], Sulfa antibiotics, and Vytorin [ezetimibe-simvastatin]    Review of Systems   Review of Systems  All other systems reviewed and are negative.   Physical Exam Updated Vital Signs BP 112/75   Pulse 99   Temp 98.4 F (36.9 C)   Resp 14   SpO2 100%  Physical Exam Vitals and nursing note reviewed.  Constitutional:      General: She is not in acute distress.    Appearance: She is well-developed.  HENT:     Head: Normocephalic and atraumatic.  Eyes:     Conjunctiva/sclera: Conjunctivae normal.  Cardiovascular:     Rate and Rhythm: Normal rate and regular rhythm.     Heart sounds: No murmur heard. Pulmonary:     Effort: Pulmonary effort is normal. No respiratory distress.     Breath sounds: Normal breath sounds. No wheezing, rhonchi or rales.  Abdominal:     Palpations: Abdomen is soft.     Tenderness: There is no abdominal tenderness. There is no guarding.  Musculoskeletal:        General: No swelling.     Cervical back: Neck supple.  Skin:    General: Skin is warm and dry.     Capillary Refill: Capillary refill takes less  than 2 seconds.  Neurological:     Mental Status: She is alert.  Psychiatric:        Mood and Affect: Mood normal.     ED Results / Procedures / Treatments   Labs (all labs ordered are listed, but only abnormal results are displayed) Labs Reviewed  COMPREHENSIVE METABOLIC PANEL - Abnormal; Notable for the following components:      Result Value   Glucose, Bld 102 (*)    AST 12 (*)    All other components within normal limits  CBC WITH DIFFERENTIAL/PLATELET    EKG EKG Interpretation  Date/Time:  Wednesday April 13 2022  18:03:28 EST Ventricular Rate:  94 PR Interval:  139 QRS Duration: 94 QT Interval:  360 QTC Calculation: 451 R Axis:   41 Text Interpretation: Sinus rhythm Low voltage, precordial leads Abnormal R-wave progression, early transition Confirmed by Nanda Quinton [phone]) on 04/13/2022 6:18:28 PM  Radiology No results found.  Procedures Procedures    Medications Ordered in ED Medications  sodium chloride 0.9 % bolus 500 mL (0 mLs Intravenous Stopped 04/13/22 1946)    ED Course/ Medical Decision Making/ A&P                              Medical Decision Making Amount and/or Complexity of Data Reviewed Labs: ordered.   This patient presents to the ED for concern of hyperkalemia, this involves an extensive number of treatment options, and is a complaint that carries with it a high risk of complications and morbidity.  The differential diagnosis includes hyperkalemia  Co morbidities that complicate the patient evaluation  See HPI   Additional history obtained:  Additional history obtained from EMR External records from outside source obtained and reviewed including hospital records   Lab Tests:  I Ordered, and personally interpreted labs.  The pertinent results include: No leukocytosis noted.  No evidence of anemia.  Platelets within range.  CMP pending.   Imaging Studies ordered:  N/a   Cardiac Monitoring: / EKG:  The patient was maintained on a cardiac monitor.  I personally viewed and interpreted the cardiac monitored which showed an underlying rhythm of: Sinus rhythm   Consultations Obtained:  I requested consultation with Dr. Laverta Baltimore who is in agreement with treatment plan going forward.  Problem List / ED Course / Critical interventions / Medication management  Abnormal lab I ordered medication including normal saline   Reevaluation of the patient after these medicines showed that the patient stayed the same I have reviewed the patients home medicines and have  made adjustments as needed   Social Determinants of Health:  Former cigarette use.  Denies illicit drug use.   Test / Admission - Considered:  Abnormal lab Vitals signs within normal range and stable throughout visit. Laboratory studies significant for: See above Patient presents with abnormal lab by primary care provider.  Given history of diarrhea with no change in medication/diet, moderate suspicion for hemolyzed sample of prior metabolic panel.  Metabolic panel to be rechecked and treated accordingly.  Patient currently without any complaint.  At shift change, patient care handed off to [facility].  Patient stable upon shift change.         Final Clinical Impression(s) / ED Diagnoses Final diagnoses:  Abnormal laboratory test result    Rx / DC Orders ED Discharge Orders     None         Wilnette Kales, [place]  04/14/22 2563    Margette Fast, MD 04/21/22 [phone]

## 2022-04-13 NOTE — Telephone Encounter (Signed)
Critical potassium-6.4

## 2022-04-13 NOTE — Discharge Instructions (Addendum)
Please return to the ED with any new symptoms Please follow-up with your PCP for further management.  As discussed, your potassium here was 4.5.

## 2022-04-13 NOTE — ED Triage Notes (Signed)
Was called by provider regarding high potassium.  K:5.4 Done today in office

## 2022-04-13 NOTE — Telephone Encounter (Signed)
I called and advised patient to go to emergency room for incidental potassium 6.4 - possible artifactual.

## 2022-04-13 NOTE — Progress Notes (Signed)
Subjective:    Patient ID: Alyssa Deleon, female    DOB: 1955-05-08, 67 y.o.   MRN: [id number]  Chief Complaint  Patient presents with   lab follow up    HPI Patient is in today for concerns of mucous and blood in stool. She recently had about 4 weeks straight of diarrhea. Stool panel was normal. Finally had a regular BM yesterday and today, still with BRBPR and mucous.  No pain with bowel movements. No abdominal pain. No nausea or vomiting. Appetite is better this week. Still feels like she doesn't have any energy.  No blood in urine.   Cologuard normal 06/23/20.   Past Medical History:  Diagnosis Date   Breast cancer ([place])    Cataract, left 09/29/2017   Chronic pain syndrome, on Neurontin 09/30/2017   Dry eye syndrome of both eyes, on Restasis 09/30/2017   Emphysema lung ([place])    Family history of breast cancer    Family history of melanoma    Family history of ovarian cancer    Family history of prostate cancer    Family history of throat cancer    Frequent urinary tract infections    GAD (generalized anxiety disorder) 03/11/2015   Genital herpes    History of chicken pox    History of degenerative disc disease 10/27/2002   Knee osteonecrosis, 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   Major depression, recurrent, chronic ([place]), followed by Psych, on Wellbutrin, Cymbalta 09/30/2017   Mixed hyperlipidemia, on Zetia and Zocor 09/30/2017   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   MVP (mitral valve prolapse) 03/13/2015   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   Nuclear sclerosis 06/10/2013   Panic disorder, followed by Psych, on Xanax 09/30/2017   Personal history of breast cancer    Psoriasis    PTSD (post-traumatic stress disorder) 03/11/2015   Stroke ([place])    Total knee replacement status, 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   Vitamin D deficiency 09/30/2017    Past Surgical History:  Procedure Laterality Date   AUGMENTATION MAMMAPLASTY Bilateral    BREAST BIOPSY     MASTECTOMY  Left 1995   has a tram flap   RECONSTRUCTION BREAST W/ TRAM FLAP Left 2003    Family History  Problem Relation Age of Onset   Cancer - Other Mother    Miscarriages / [place] Mother    Alzheimer's disease Mother    Breast cancer Mother    Skin cancer Mother    Cancer - Prostate Father        metastatic   Hyperlipidemia Father    Throat cancer Father    Cancer - Other Sister    Depression Sister    Hearing loss Sister    Hyperlipidemia Sister    Breast cancer Sister        both breasts   Skin cancer Sister    Cancer - Other Brother    Throat cancer Brother    Cancer - Other Sister    Breast cancer Sister 16   Breast cancer Maternal Grandmother    Alzheimer's disease Maternal Grandfather    Ovarian cancer Paternal Grandmother    Heart attack Paternal Grandfather    Breast cancer Paternal Aunt    Prostate cancer Paternal Uncle    Breast cancer Other        both breasts dx 13    Social History   Tobacco Use   Smoking status: Former    Packs/day: 0.75    Years: 42.00    Total  pack years: 31.50    Types: Cigarettes    Quit date: 11/06/2020    Years since quitting: 1.4    Passive exposure: Past   Smokeless tobacco: Never  Vaping Use   Vaping Use: Never used  Substance Use Topics   Alcohol use: Yes    Comment: Special Occasions   Drug use: Never     Allergies  Allergen Reactions   Hydrocodone-Acetaminophen Shortness Of Breath   Klonopin [Clonazepam] Swelling   Crestor [Rosuvastatin] Other (See Comments)    Myalgias   Morphine Sulfate Itching   Effexor [Venlafaxine] Other (See Comments)    Weight gain   Seasonal Ic [Cholestatin]     Pollen, sinus coughing etc.   Sulfa Antibiotics Diarrhea and Other (See Comments)   Vytorin [Ezetimibe-Simvastatin] Other (See Comments)    Myalgias    Review of Systems NEGATIVE UNLESS OTHERWISE INDICATED IN HPI      Objective:     BP (!) 110/56 (BP Location: Right Arm, Patient Position: Sitting)   Pulse 91    Temp 98 F (36.7 C) (Temporal)   Ht 5' 7.5" (1.715 m)   Wt 181 lb 3.2 oz (82.2 kg)   SpO2 96%   BMI 27.96 kg/m   Wt Readings from Last 3 Encounters:  04/13/22 181 lb 3.2 oz (82.2 kg)  04/05/22 179 lb (81.2 kg)  03/10/22 179 lb 6.4 oz (81.4 kg)    BP Readings from Last 3 Encounters:  04/13/22 (!) 110/56  03/10/22 110/74  01/28/22 112/84     Physical Exam Vitals reviewed.  Constitutional:      Appearance: Normal appearance.  Eyes:     General:        Right eye: No discharge.        Left eye: No discharge.     Conjunctiva/sclera:     Right eye: Right conjunctiva is not injected.     Left eye: Left conjunctiva is injected.  Cardiovascular:     Rate and Rhythm: Normal rate and regular rhythm.  Pulmonary:     Effort: Pulmonary effort is normal.     Breath sounds: Normal breath sounds.  Abdominal:     Tenderness: There is abdominal tenderness (minimal central abdominal pain).  Neurological:     General: No focal deficit present.     Mental Status: She is alert.  Psychiatric:        Mood and Affect: Mood normal.        Behavior: Behavior normal.        Assessment & Plan:  Diarrhea, unspecified type -     CBC with Differential/Platelet -     Comprehensive metabolic panel -     Sedimentation rate -     C-reactive protein -     Lipase -     Ambulatory referral to Gastroenterology  Hematochezia -     CBC with Differential/Platelet -     Comprehensive metabolic panel -     Sedimentation rate -     C-reactive protein -     Lipase -     Ambulatory referral to Gastroenterology   Need to check labs, treat pending abnormal results.  Thankfully bowel movements are getting back to her normal state.  I encouraged her to keep up the good work with her nutrition and pushing fluids.  Plan to refer her to GI to be on the safe side, concern for anything from irritated hemorrhoids, to ulcerative colitis, to malignancy of colon with these symptoms. Strict emergency  department  precautions.  Patient agreeable and understanding.     Return if symptoms worsen or fail to improve.  This note was prepared with assistance of Systems analyst. Occasional wrong-word or sound-a-like substitutions may have occurred due to the inherent limitations of voice recognition software.      Kolsen Choe M Bellarose Burtt, PA-C

## 2022-04-13 NOTE — ED Provider Notes (Signed)
  Physical Exam  BP (!) 122/106 (BP Location: Right Arm)   Pulse 99   Temp 98.4 F (36.9 C)   Resp 18   SpO2 100%   Physical Exam Vitals and nursing note reviewed.  Constitutional:      General: She is not in acute distress.    Appearance: She is well-developed.  HENT:     Head: Normocephalic and atraumatic.  Eyes:     Conjunctiva/sclera: Conjunctivae normal.  Cardiovascular:     Rate and Rhythm: Normal rate and regular rhythm.     Heart sounds: No murmur heard. Pulmonary:     Effort: Pulmonary effort is normal. No respiratory distress.     Breath sounds: Normal breath sounds.  Abdominal:     Palpations: Abdomen is soft.     Tenderness: There is no abdominal tenderness.  Musculoskeletal:        General: No swelling.     Cervical back: Neck supple.  Skin:    General: Skin is warm and dry.     Capillary Refill: Capillary refill takes less than 2 seconds.  Neurological:     Mental Status: She is alert.  Psychiatric:        Mood and Affect: Mood normal.     Procedures  Procedures  ED Course / MDM      Medical Decision Making Amount and/or Complexity of Data Reviewed Labs: ordered.   Patient signed out to me at shift change.  Please see previous provider note for further details.  In short, this is a 67 year old female who presents to the ED due to abnormal lab.  Patient reports that she was advised by her PCP that her potassium was elevated however patient states that her PCP advised that he believes that this value was an error.  The patient was sent here for lab recheck and electrolyte optimization.  Patient lab work unremarkable.  The patient potassium is 4.5.  Patient CBC is without leukocytosis or anemia.  Patient will be discharged home and advised to follow back up with PCP.  Patient advised to return to ED with any new or worsening signs or symptoms.       Azucena Cecil, PA-C 04/13/22 1931    Margette Fast, MD 04/13/22 1954

## 2022-04-15 ENCOUNTER — Encounter: Payer: Self-pay | Admitting: Physician Assistant

## 2022-04-18 NOTE — Telephone Encounter (Signed)
Please see pt msg needing prior auth for medication to be filled

## 2022-04-19 NOTE — Telephone Encounter (Signed)
Please see pt msg and advise 

## 2022-04-20 ENCOUNTER — Other Ambulatory Visit (HOSPITAL_COMMUNITY): Payer: Self-pay

## 2022-04-20 NOTE — Telephone Encounter (Signed)
I called [facility], receptionist stated they will call her today !

## 2022-04-20 NOTE — Telephone Encounter (Signed)
Per test claim, no PA is needed for 90 day supply.

## 2022-04-21 ENCOUNTER — Encounter: Payer: Self-pay | Admitting: Physician Assistant

## 2022-04-21 ENCOUNTER — Other Ambulatory Visit: Payer: Self-pay

## 2022-04-21 DIAGNOSIS — E782 Mixed hyperlipidemia: Secondary | ICD-10-CM

## 2022-04-21 MED ORDER — EZETIMIBE 10 MG PO TABS: 10.0000 mg | ORAL_TABLET | Freq: Every day | ORAL | 3 refills | Status: DC

## 2022-04-21 NOTE — Telephone Encounter (Signed)
[facility] since [facility] is on back order and [facility], no PA is needed, patient was advised

## 2022-04-21 NOTE — Telephone Encounter (Signed)
Contacted [facility] and was advised that Ezetimibe is on back order and advised pt she could get brand name or we could send alternative, pt has been advised and checking [facility] to see the cost at their pharmacy for Rx.

## 2022-04-24 ENCOUNTER — Encounter: Payer: Self-pay | Admitting: Physician Assistant

## 2022-04-25 NOTE — Telephone Encounter (Signed)
Please see pt msg as FYI 

## 2022-04-25 NOTE — Telephone Encounter (Signed)
Please see pt msg, can't get into GI for a month

## 2022-04-27 NOTE — Telephone Encounter (Signed)
Please see msg and advise.  

## 2022-04-28 ENCOUNTER — Other Ambulatory Visit: Payer: Self-pay

## 2022-04-28 ENCOUNTER — Emergency Department (HOSPITAL_COMMUNITY)
Admission: EM | Admit: 2022-04-28 | Discharge: 2022-04-28 | Disposition: A | Discharge status: Home / Self Care | Payer: Medicare Other | Attending: Emergency Medicine | Admitting: Emergency Medicine

## 2022-04-28 ENCOUNTER — Telehealth: Payer: Self-pay

## 2022-04-28 ENCOUNTER — Encounter (HOSPITAL_COMMUNITY): Payer: Self-pay

## 2022-04-28 DIAGNOSIS — R197 Diarrhea, unspecified: Secondary | ICD-10-CM | POA: Insufficient documentation

## 2022-04-28 DIAGNOSIS — K529 Noninfective gastroenteritis and colitis, unspecified: Secondary | ICD-10-CM

## 2022-04-28 DIAGNOSIS — Z7982 Long term (current) use of aspirin: Secondary | ICD-10-CM | POA: Insufficient documentation

## 2022-04-28 LAB — CBC WITH DIFFERENTIAL/PLATELET
Abs Immature Granulocytes: 0.02 10*3/uL (ref 0.00–0.07)
Basophils Absolute: 0 10*3/uL (ref 0.0–0.1)
Basophils Relative: 0 %
Eosinophils Absolute: 0.2 10*3/uL (ref 0.0–0.5)
Eosinophils Relative: 2 %
HCT: 38.2 % (ref 36.0–46.0)
Hemoglobin: 12.3 g/dL (ref 12.0–15.0)
Immature Granulocytes: 0 %
Lymphocytes Relative: 21 %
Lymphs Abs: 1.3 10*3/uL (ref 0.7–4.0)
MCH: 30.5 pg (ref 26.0–34.0)
MCHC: 32.2 g/dL (ref 30.0–36.0)
MCV: 94.8 fL (ref 80.0–100.0)
Monocytes Absolute: 0.4 10*3/uL (ref 0.1–1.0)
Monocytes Relative: 7 %
Neutro Abs: 4.4 10*3/uL (ref 1.7–7.7)
Neutrophils Relative %: 70 %
Platelets: 224 10*3/uL (ref 150–400)
RBC: 4.03 MIL/uL (ref 3.87–5.11)
RDW: 12.5 % (ref 11.5–15.5)
WBC: 6.3 10*3/uL (ref 4.0–10.5)
nRBC: 0 % (ref 0.0–0.2)

## 2022-04-28 LAB — COMPREHENSIVE METABOLIC PANEL
ALT: 11 U/L (ref 0–44)
AST: 18 U/L (ref 15–41)
Albumin: 4.8 g/dL (ref 3.5–5.0)
Alkaline Phosphatase: 63 U/L (ref 38–126)
Anion gap: 7 (ref 5–15)
BUN: 18 mg/dL (ref 8–23)
CO2: 28 mmol/L (ref 22–32)
Calcium: 8.3 mg/dL — ABNORMAL LOW (ref 8.9–10.3)
Chloride: 107 mmol/L (ref 98–111)
Creatinine, Ser: 0.69 mg/dL (ref 0.44–1.00)
GFR, Estimated: >60 mL/min (ref >60)
Glucose, Bld: 107 mg/dL — ABNORMAL HIGH (ref 70–99)
Potassium: 4.5 mmol/L (ref 3.5–5.1)
Sodium: 142 mmol/L (ref 135–145)
Total Bilirubin: 0.7 mg/dL (ref 0.3–1.2)
Total Protein: 7.6 g/dL (ref 6.5–8.1)

## 2022-04-28 LAB — LIPASE, BLOOD: Lipase: 41 U/L (ref 11–51)

## 2022-04-28 LAB — MAGNESIUM: Magnesium: 2.3 mg/dL (ref 1.7–2.4)

## 2022-04-28 MED ORDER — LOPERAMIDE HCL 2 MG PO CAPS: 2.0000 mg | ORAL_CAPSULE | Freq: Four times a day (QID) | ORAL | 0 refills | Status: DC | PRN

## 2022-04-28 NOTE — Telephone Encounter (Signed)
Received a call from Ellouise Newer, PA-C requesting sooner appt for patient. Pt has been scheduled for an appt with Dr. Loletha Carrow on Monday, 05/02/22 at 10:40 am.

## 2022-04-28 NOTE — ED Provider Triage Note (Signed)
Emergency Medicine Provider Triage Evaluation Note  Alyssa Deleon , a 67 y.o. female  was evaluated in triage.  Pt complains of diarrhea X 2 months, had labs and stool studies per PCP, but cannot get a colonoscopy until April and symptoms are continuing.She reports blood and mucous in the stool. Stool is brown with BRB mixed in per the pictures she provides in triage  Review of Systems  Positive: diarrhea Negative: fever  Physical Exam  BP (!) 147/76 (BP Location: Left Arm)   Pulse 89   Temp 98 F (36.7 C) (Oral)   Resp 16   SpO2 100%  Gen:   Awake, no distress   Resp:  Normal effort  MSK:   Moves extremities without difficulty  Other:    Medical Decision Making  Medically screening exam initiated at 12:11 PM.  Appropriate orders placed.  [name] was informed that the remainder of the evaluation will be completed by another provider, this initial triage assessment does not replace that evaluation, and the importance of remaining in the ED until their evaluation is complete.     Sherrye Payor A, [place] 04/28/22 1213

## 2022-04-28 NOTE — Discharge Instructions (Signed)
You were seen for your diarrhea in the emergency department.  We talked to the GI specialists and they will see if they can move up your appointment.  At home, please take Imodium for your diarrhea.    Follow-up with your primary doctor in 2-3 days regarding your visit.    Return immediately to the emergency department if you experience any of the following: Dizziness, fainting, fever, severe abdominal pain, or any other concerning symptoms.    Thank you for visiting our Emergency Department. It was a pleasure taking care of you today.

## 2022-04-28 NOTE — ED Triage Notes (Signed)
C/o of diarrhea x2 months with intermittent LLQ pain and blood noted in stool with mucous.  Referred to ED by PCP.  Pt reports needing colonoscopy and scheduled for April.  Denies cp, sob, dizziness.

## 2022-04-28 NOTE — ED Provider Notes (Signed)
Alyssa Deleon AT [facility] Provider Note   CSN: [id number] Arrival date & time: 04/28/22  1146     History  Chief Complaint  Patient presents with   Diarrhea    Alyssa Deleon is a 67 y.o. female.  67 year old female who presents emergency department with diarrhea.  Patient reports for the past 2 months she has been having diarrhea.  Describes it as mucousy and occasionally blood-streaked.  Denies any nausea or vomiting.  Typically has between 6-12 stools per day.  Also reports occasional left lower quadrant pain but has not had this in several days.  Denies any fevers.  No recent travels or antibiotics.  Has been seen multiple times for this.  Was seen by her primary doctor and had labs that were sent earlier in the month which included inflammatory markers which were normal, GI pathogen panel including C. difficile and stool ova and parasites that were normal.  Has been referred to GI but does not have an appointment till later in the month and so she talked her primary doctor who told her that with her symptoms persisting she should go to the emergency department for additional evaluation.  Believes her last colonoscopy was at 10.        Home Medications Prior to Admission medications   Medication Sig Start Date End Date Taking? Authorizing Provider  ALPRAZolam Duanne Moron) 1 MG tablet TAKE 2 TABLETS BY MOUTH EVERY  NIGHT AT BEDTIME AS NEEDED FOR  ANXIETY 12/12/21   Allwardt, Randa Evens, PA-C  aspirin EC 81 MG tablet Take 1 tablet (81 mg total) by mouth daily. Swallow whole. 05/31/21   Belva Crome, MD  buPROPion (WELLBUTRIN XL) 300 MG 24 hr tablet TAKE 1 TABLET BY MOUTH DAILY 03/18/22   Allwardt, Randa Evens, PA-C  DULoxetine (CYMBALTA) 60 MG capsule TAKE 1 CAPSULE BY MOUTH DAILY 03/18/22   Allwardt, Randa Evens, PA-C  ezetimibe (ZETIA) 10 MG tablet Take 1 tablet (10 mg total) by mouth daily. 04/21/22   Allwardt, Randa Evens, PA-C  loperamide (IMODIUM) 2 MG capsule  Take 1 capsule (2 mg total) by mouth 4 (four) times daily as needed for diarrhea or loose stools. 04/28/22   Fransico Meadow, MD  simvastatin (ZOCOR) 40 MG tablet TAKE 1 TABLET BY MOUTH DAILY 01/24/22   Allwardt, Alyssa M, PA-C  TYRVAYA 0.03 MG/ACT SOLN Place 1 spray into both nostrils 2 (two) times daily. 02/22/22   [provider]      Allergies    Hydrocodone-acetaminophen, Hydrocodone-acetaminophen, Klonopin [clonazepam], Morphine sulfate, Rosuvastatin, Venlafaxine, Grass pollen(k-o-r-t-swt vern), Seasonal ic [cholestatin], Ezetimibe-simvastatin, and Sulfa antibiotics    Review of Systems   Review of Systems  Physical Exam Updated Vital Signs BP (!) 119/91   Pulse 90   Temp 98 F (36.7 C) (Oral)   Resp 16   SpO2 100%  Physical Exam Vitals and nursing note reviewed.  Constitutional:      General: She is not in acute distress.    Appearance: She is well-developed.  HENT:     Head: Normocephalic and atraumatic.  Eyes:     Conjunctiva/sclera: Conjunctivae normal.  Cardiovascular:     Rate and Rhythm: Normal rate and regular rhythm.  Pulmonary:     Effort: Pulmonary effort is normal. No respiratory distress.  Abdominal:     General: There is no distension.     Palpations: Abdomen is soft. There is no mass.     Tenderness: There is no abdominal  tenderness. There is no guarding.  Musculoskeletal:        General: No swelling.     Cervical back: Neck supple.  Skin:    General: Skin is warm and dry.     Capillary Refill: Capillary refill takes less than 2 seconds.  Neurological:     Mental Status: She is alert.  Psychiatric:        Mood and Affect: Mood normal.     ED Results / Procedures / Treatments   Labs (all labs ordered are listed, but only abnormal results are displayed) Labs Reviewed  COMPREHENSIVE METABOLIC PANEL - Abnormal; Notable for the following components:      Result Value   Glucose, Bld 107 (*)    Calcium 8.3 (*)    All other components  within normal limits  CBC WITH DIFFERENTIAL/PLATELET  LIPASE, BLOOD  MAGNESIUM    EKG None  Radiology No results found.  Procedures Procedures   Medications Ordered in ED Medications - No data to display  ED Course/ Medical Decision Making/ A&P Clinical Course as of 04/28/22 1647  Thu Apr 28, 2022  1449 Discussed with on call GI feels that she needs a colonoscopy as an outpatient.  Do not feel that any additional workup needs to be done here in the emergency department to facilitate this.  They will call to try to expedite her workup. Also recommends ammodium or lomotil for her symptoms.  [RP]    Clinical Course User Index [RP] Fransico Meadow, MD                            Medical Decision Making Amount and/or Complexity of Data Reviewed Labs: ordered.  Risk Prescription drug management.   Alyssa Deleon is a 67 y.o. female with comorbidities that complicate the patient evaluation including chronic diarrhea who presents with diarrhea for 2 months  Initial Ddx:  Infectious diarrhea, C. difficile, Crohn's, ulcerative colitis, diverticulitis, dehydration, AKI, electrolyte abnormality  MDM:  Patient is already had an extensive workup of her diarrhea at this point in time.  Appears that common infectious causes such as C. difficile have been ruled out.  Stool ova and parasites without any abnormality.  Inflammatory markers were normal at her primary care doctors making ulcerative colitis and Crohn's less likely.  Would be rare for diverticulitis to have a chronic presentation like this and does not have any abdominal tenderness to palpation on exam so do not feel this is likely and do not feel that CT scan is indicated at this time.  Will check labs for AKI and electrolyte abnormality which could accompany her diarrhea but feel this is less likely.  Will discuss with GI to help facilitate her outpatient workup as long as everything is normal in the emergency  department.  Plan:  Labs GI consult  ED Summary/Re-evaluation:  Labs returned and were reassuring.  Did discuss with GI who will attempt to move up her appointment given the persistence of her symptoms.  They agree that she likely needs colonoscopy at some point.  Despite small amount of blood in her stool that she had reported her hemoglobin was stable so do not feel that life-threatening GI bleed is likely.  She was given Imodium and instructed to follow-up with them in her primary doctor.  This patient presents to the ED for concern of complaints listed in HPI, this involves an extensive number of treatment options, and is a  complaint that carries with it a high risk of complications and morbidity. Disposition including potential need for admission considered.   Dispo: DC Home. Return precautions discussed including, but not limited to, those listed in the AVS. Allowed pt time to ask questions which were answered fully prior to dc.  Records reviewed Outpatient Clinic Notes The following labs were independently interpreted: Chemistry and show no acute abnormality I personally reviewed and interpreted cardiac monitoring: normal sinus rhythm  I personally reviewed and interpreted the pt's EKG: see above for interpretation  I have reviewed the patients home medications and made adjustments as needed Consults: Gastroenterology Social Determinants of health:  Elderly  Final Clinical Impression(s) / ED Diagnoses Final diagnoses:  Chronic diarrhea    Rx / DC Orders ED Discharge Orders          Ordered    loperamide (IMODIUM) 2 MG capsule  4 times daily PRN,   Status:  Discontinued        04/28/22 1506    loperamide (IMODIUM) 2 MG capsule  4 times daily PRN        04/28/22 1530              Fransico Meadow, MD 04/28/22 [phone]

## 2022-05-01 ENCOUNTER — Other Ambulatory Visit: Payer: Self-pay | Admitting: Physician Assistant

## 2022-05-01 DIAGNOSIS — F063 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, unspecified: Secondary | ICD-10-CM

## 2022-05-02 ENCOUNTER — Encounter: Payer: Self-pay | Admitting: Gastroenterology

## 2022-05-02 ENCOUNTER — Ambulatory Visit: Payer: Medicare Other | Admitting: Gastroenterology

## 2022-05-02 VITALS — BP 118/68 | HR 94 | Ht 67.0 in | Wt 183.0 lb

## 2022-05-02 DIAGNOSIS — K625 Hemorrhage of anus and rectum: Secondary | ICD-10-CM | POA: Diagnosis not present

## 2022-05-02 DIAGNOSIS — K529 Noninfective gastroenteritis and colitis, unspecified: Secondary | ICD-10-CM | POA: Diagnosis not present

## 2022-05-02 NOTE — Progress Notes (Signed)
Alyssa Deleon Consult Note:  History: Alyssa Deleon 05/02/2022  Referring provider: Allwardt, Randa Evens, PA-C  Reason for consult/chief complaint: Diarrhea (Pt state having diarrhea very loose stool. Pt also states having some mucus and bleeding with each bowel movement.)   Subjective  HPI:  This is a 67 year old woman referred by primary care for chronic diarrhea.  She has had over 2 months of diarrhea with blood streaking and mucus.  GI pathogen panel was negative.  Because of ongoing symptoms while waiting for an appointment with [place], she contacted primary care last week and was directed to the ED.  She was clinically stable, labs were done no imaging.  [name] is frustrated because she was told she would be seen by a GI physician during that ED visit. She does not recall any of her medicines being new or with dose change within a month or 2 prior to onset of symptoms.  She was advised to take Imodium, but it causes her to be bloated and nauseated.  She had a colonoscopy at another local GI practice over a decade ago and recalls that it was "okay".  Negative Cologuard March 2022. Prior to onset of diarrhea, bowel habits were typically of normal character, sometimes small and pellet-like.  Weight has been increasing in recent months.  ROS:  Review of Systems  Constitutional:  Negative for appetite change and unexpected weight change.  HENT:  Negative for mouth sores and voice change.   Eyes:  Negative for pain and redness.  Respiratory:  Negative for cough and shortness of breath.   Cardiovascular:  Negative for chest pain and palpitations.  Genitourinary:  Negative for dysuria and hematuria.  Musculoskeletal:  Negative for arthralgias and myalgias.  Skin:  Negative for pallor and rash.  Neurological:  Negative for weakness and headaches.  Hematological:  Negative for adenopathy.  Psychiatric/Behavioral:         Anxiety     Past Medical History: Past  Medical History:  Diagnosis Date   Breast cancer ([place])    Cataract, left 09/29/2017   Chronic pain syndrome, on Neurontin 09/30/2017   Dry eye syndrome of both eyes, on Restasis 09/30/2017   Emphysema lung ([place])    Family history of breast cancer    Family history of melanoma    Family history of ovarian cancer    Family history of prostate cancer    Family history of throat cancer    Frequent urinary tract infections    GAD (generalized anxiety disorder) 03/11/2015   Genital herpes    History of chicken pox    History of degenerative disc disease 10/27/2002   Kidney stone    Knee osteonecrosis, 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   Major depression, recurrent, chronic ([place]), followed by Psych, on Wellbutrin, Cymbalta 09/30/2017   Mixed hyperlipidemia, on Zetia and Zocor 09/30/2017   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   MVP (mitral valve prolapse) 03/13/2015   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   Nuclear sclerosis 06/10/2013   Panic disorder, followed by Psych, on Xanax 09/30/2017   Personal history of breast cancer    Psoriasis    PTSD (post-traumatic stress disorder) 03/11/2015   Stroke ([place])    Total knee replacement status, 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   Vitamin D deficiency 09/30/2017     Past Surgical History: Past Surgical History:  Procedure Laterality Date   AUGMENTATION MAMMAPLASTY Bilateral    BREAST BIOPSY     KIDNEY STONE SURGERY  01/2022   MASTECTOMY Left  1995   has a tram flap   RECONSTRUCTION BREAST W/ TRAM FLAP Left 2003     Family History: Family History  Problem Relation Age of Onset   Cancer - Other Mother    Miscarriages / [place] Mother    Alzheimer's disease Mother    Breast cancer Mother    Skin cancer Mother    Cancer - Prostate Father        metastatic   Hyperlipidemia Father    Throat cancer Father    Cancer - Other Sister    Depression Sister    Hearing loss Sister    Hyperlipidemia Sister    Breast cancer Sister        both  breasts   Skin cancer Sister    Cancer - Other Sister    Breast cancer Sister 73   Cancer - Other Brother    Throat cancer Brother    Breast cancer Maternal Grandmother    Alzheimer's disease Maternal Grandfather    Ovarian cancer Paternal Grandmother    Heart attack Paternal Grandfather    Breast cancer Paternal Aunt    Prostate cancer Paternal Uncle    Breast cancer Other        both breasts dx 43   Liver disease Neg Hx    Colon cancer Neg Hx     Social History: Social History   Socioeconomic History   Marital status: Divorced    Spouse name: Not on file   Number of children: 2   Years of education: Not on file   Highest education level: Not on file  Occupational History   Occupation: Retired/ disabilty  Tobacco Use   Smoking status: Former    Packs/day: 0.75    Years: 42.00    Total pack years: 31.50    Types: Cigarettes    Quit date: 11/06/2020    Years since quitting: 1.4    Passive exposure: Past   Smokeless tobacco: Never  Vaping Use   Vaping Use: Never used  Substance and Sexual Activity   Alcohol use: Yes    Comment: Special Occasions   Drug use: Never   Sexual activity: Not Currently  Other Topics Concern   Not on file  Social History Narrative   Quit smoking in 11/06/2020       Right Handed    Lives in a town home with 14 stairs.    Social Determinants of Health   Financial Resource Strain: Low Risk  (06/17/2021)   Overall Financial Resource Strain (CARDIA)    Difficulty of Paying Living Expenses: Not hard at all  Food Insecurity: No Food Insecurity (06/17/2021)   Hunger Vital Sign    Worried About Running Out of Food in the Last Year: Never true    Ran Out of Food in the Last Year: Never true  Transportation Needs: No Transportation Needs (06/17/2021)   PRAPARE - Hydrologist (Medical): No    Lack of Transportation (Non-Medical): No  Physical Activity: Inactive (06/17/2021)   Exercise Vital Sign    Days of Exercise  per Week: 0 days    Minutes of Exercise per Session: 0 min  Stress: No Stress Concern Present (06/17/2021)   [place]    Feeling of Stress : Not at all  Social Connections: Socially Isolated (06/17/2021)   Social Connection and Isolation Panel [NHANES]    Frequency of Communication with Friends and Family: More than three  times a week    Frequency of Social Gatherings with Friends and Family: Once a week    Attends Religious Services: Never    Marine scientist or Organizations: No    Attends Archivist Meetings: Never    Marital Status: Divorced    Allergies: Allergies  Allergen Reactions   Hydrocodone-Acetaminophen Shortness Of Breath   Hydrocodone-Acetaminophen Shortness Of Breath    SOB   Klonopin [Clonazepam] Swelling   Morphine Sulfate Itching   Rosuvastatin Other (See Comments)    Myalgias  Muscle cramps    Myalgias   Venlafaxine Other (See Comments)    Weight gain   Grass Pollen(K-O-R-T-Swt Vern) Other (See Comments)   Seasonal Ic [Cholestatin]     Pollen, sinus coughing etc.   Ezetimibe-Simvastatin Other (See Comments)    Myalgias  Causes cramps   Sulfa Antibiotics Diarrhea and Other (See Comments)    unknown    Stomach ache    Outpatient Meds: Current Outpatient Medications  Medication Sig Dispense Refill   ALPRAZolam (XANAX) 1 MG tablet TAKE 2 TABLETS BY MOUTH EVERY  NIGHT AT BEDTIME AS NEEDED FOR  AXIETY 180 tablet 0   aspirin EC 81 MG tablet Take 1 tablet (81 mg total) by mouth daily. Swallow whole. 30 tablet 11   buPROPion (WELLBUTRIN XL) 300 MG 24 hr tablet TAKE 1 TABLET BY MOUTH DAILY 90 tablet 3   DULoxetine (CYMBALTA) 60 MG capsule TAKE 1 CAPSULE BY MOUTH DAILY 90 capsule 3   ezetimibe (ZETIA) 10 MG tablet Take 1 tablet (10 mg total) by mouth daily. 90 tablet 3   loperamide (IMODIUM) 2 MG capsule Take 1 capsule (2 mg total) by mouth 4 (four) times daily as needed for  diarrhea or loose stools. 20 capsule 0   simvastatin (ZOCOR) 40 MG tablet TAKE 1 TABLET BY MOUTH DAILY 90 tablet 3   TYRVAYA 0.03 MG/ACT SOLN Place 1 spray into both nostrils 2 (two) times daily.     No current facility-administered medications for this visit.    No NSAIDs  ___________________________________________________________________ Objective   Exam:  BP 118/68   Pulse 94   Ht '5\' 7"'$  (1.702 m)   Wt 183 lb (83 kg)   SpO2 97%   BMI 28.66 kg/m  Wt Readings from Last 3 Encounters:  05/02/22 183 lb (83 kg)  04/13/22 181 lb 3.2 oz (82.2 kg)  04/05/22 179 lb (81.2 kg)    General: Pleasant, not acutely ill-appearing.  No muscle wasting Eyes: sclera anicteric, no redness ENT: oral mucosa moist without lesions, no cervical or supraclavicular lymphadenopathy CV: Regular without appreciable murmur, no JVD, no peripheral edema Resp: clear to auscultation bilaterally, normal RR and effort noted GI: soft, no focal tenderness, with active bowel sounds.  No bruit no guarding or palpable organomegaly noted. Skin; warm and dry, no rash or jaundice noted Neuro: awake, alert and oriented x 3. Normal gross motor function and fluent speech  Labs:     Latest Ref Rng & Units 04/28/2022    1:21 PM 04/13/2022    6:08 PM 04/13/2022   11:55 AM  CBC  WBC 4.0 - 10.5 K/uL 6.3  7.1  6.9   Hemoglobin 12.0 - 15.0 g/dL 12.3  12.1  12.9   Hematocrit 36.0 - 46.0 % 38.2  38.0  39.4   Platelets 150 - 400 K/uL 224  247  264.0       Latest Ref Rng & Units 04/28/2022    1:21 PM  04/13/2022    6:08 PM 04/13/2022   11:55 AM  CMP  Glucose 70 - 99 mg/dL 107  102  102   BUN 8 - 23 mg/dL '18  15  14   '$ Creatinine 0.44 - 1.00 mg/dL 0.69  0.91  1.00   Sodium 135 - 145 mmol/L 142  143  146   Potassium 3.5 - 5.1 mmol/L 4.5  4.5  6.4   Chloride 98 - 111 mmol/L 107  103  105   CO2 22 - 32 mmol/L '28  31  31   '$ Calcium 8.9 - 10.3 mg/dL 8.3  9.9  9.9   Total Protein 6.5 - 8.1 g/dL 7.6  7.0  7.0   Total Bilirubin  0.3 - 1.2 mg/dL 0.7  0.5  0.6   Alkaline Phos 38 - 126 U/L 63  69  81   AST 15 - 41 U/L '18  12  13   '$ ALT 0 - 44 U/L '11  6  7    '$ ESR normal last month and Oct 2023  GI pathogen panel incl C doff toxin neg last month  Nml cologuard March 2022  Radiologic Studies:  CT urogram Aug 2023 - incidental gallstones seen - evaluated by Dr. Rosendo Gros at [facility] Oct 2023  Assessment: Encounter Diagnoses  Name Primary?   Chronic diarrhea Yes   Rectal bleeding     Over 2 months of chronic diarrhea with rectal bleeding and passage of mucus.  Negative GI pathogen panel.  Possible proctitis/colitis, though 2 normal sed rates in recent months. Possibly microscopic colitis or IBS with associated microscopic bleeding (reports 6-8 BMs daily if not taking Imodium). Imodium causes constipation and bloating, so I recommended she cut dose by half.  If that still does not seem to help, could try dicyclomine. No weight loss to suggest malabsorption such as EPI (former smoker), no clear risk factors for SIBO.  Plan:  Colonoscopy.  She was agreeable after discussion of procedure and risks.  The benefits and risks of the planned procedure were described in detail with the patient or (when appropriate) their health care proxy.  Risks were outlined as including, but not limited to, bleeding, infection, perforation, adverse medication reaction leading to cardiac or pulmonary decompensation, pancreatitis (if ERCP).  The limitation of incomplete mucosal visualization was also discussed.  No guarantees or warranties were given.  Other medicine recommendations as noted above.  Thank you for the courtesy of this consult.  Please call me with any questions or concerns.  Nelida Meuse III  CC: Referring provider noted above

## 2022-05-02 NOTE — Telephone Encounter (Signed)
Last OV: 04/13/22  Next OV: 06/09/22  Last filled: 12/12/21  Quantity: 180 w/ 2 refills

## 2022-05-02 NOTE — Patient Instructions (Addendum)
_______________________________________________________  If your blood pressure at your visit was 140/90 or greater, please contact your primary care physician to follow up on this.  _______________________________________________________  If you are age 69 or older, your body mass index should be between 23-30. Your Body mass index is 28.66 kg/m. If this is out of the aforementioned range listed, please consider follow up with your Primary Care Provider.  If you are age 17 or younger, your body mass index should be between 19-25. Your Body mass index is 28.66 kg/m. If this is out of the aformentioned range listed, please consider follow up with your Primary Care Provider.   ________________________________________________________  The [facility] providers would like to encourage you to use [facility] to communicate with providers for non-urgent requests or questions.  Due to long hold times on the telephone, sending your provider a message by [facility] may be a faster and more efficient way to get a response.  Please allow 48 business hours for a response.  Please remember that this is for non-urgent requests.  _______________________________________________________  Dennis Bast have been scheduled for a colonoscopy. Please follow written instructions given to you at your visit today.  Please pick up your prep supplies at the pharmacy within the next 1-3 days. If you use inhalers (even only as needed), please bring them with you on the day of your procedure.   It was a pleasure to see you today!  Thank you for trusting me with your gastrointestinal care!

## 2022-05-04 ENCOUNTER — Other Ambulatory Visit: Payer: Self-pay

## 2022-05-04 DIAGNOSIS — K529 Noninfective gastroenteritis and colitis, unspecified: Secondary | ICD-10-CM

## 2022-05-04 DIAGNOSIS — K625 Hemorrhage of anus and rectum: Secondary | ICD-10-CM

## 2022-05-04 MED ORDER — PEG 3350-KCL-NA BICARB-NACL 420 G PO SOLR: 4000.0000 mL | Freq: Once | ORAL | 0 refills | Status: AC

## 2022-05-04 NOTE — Telephone Encounter (Signed)
Inbound call from patient in regards to prep medication. States the prep has changed. Please advise.   Thank you

## 2022-05-04 NOTE — Telephone Encounter (Signed)
New instructions have been sent to the patients mychart.

## 2022-05-09 ENCOUNTER — Encounter: Payer: Self-pay | Admitting: Gastroenterology

## 2022-05-09 ENCOUNTER — Ambulatory Visit (AMBULATORY_SURGERY_CENTER): Payer: Medicare Other | Admitting: Gastroenterology

## 2022-05-09 VITALS — BP 136/64 | HR 71 | Temp 97.3°F | Resp 16 | Ht 67.0 in | Wt 183.0 lb

## 2022-05-09 DIAGNOSIS — R197 Diarrhea, unspecified: Secondary | ICD-10-CM | POA: Diagnosis not present

## 2022-05-09 DIAGNOSIS — C210 Malignant neoplasm of anus, unspecified: Secondary | ICD-10-CM

## 2022-05-09 DIAGNOSIS — K6289 Other specified diseases of anus and rectum: Secondary | ICD-10-CM

## 2022-05-09 DIAGNOSIS — K529 Noninfective gastroenteritis and colitis, unspecified: Secondary | ICD-10-CM

## 2022-05-09 DIAGNOSIS — K625 Hemorrhage of anus and rectum: Secondary | ICD-10-CM | POA: Diagnosis not present

## 2022-05-09 DIAGNOSIS — G210 Malignant neuroleptic syndrome: Secondary | ICD-10-CM

## 2022-05-09 MED ORDER — SODIUM CHLORIDE 0.9 % IV SOLN: 500.0000 mL | Freq: Once | INTRAVENOUS | Status: DC

## 2022-05-09 NOTE — Progress Notes (Signed)
VS by DT  Pt's states no medical or surgical changes since previsit or office visit.  

## 2022-05-09 NOTE — Progress Notes (Signed)
A and O x3. Report to RN. Tolerated MAC anesthesia well. 

## 2022-05-09 NOTE — Progress Notes (Signed)
Called to room to assist during endoscopic procedure.  Patient ID and intended procedure confirmed with present staff. Received instructions for my participation in the procedure from the performing physician.  

## 2022-05-09 NOTE — Patient Instructions (Signed)
- Patient has a contact number available for emergencies. The signs and symptoms of potential delayed complications were discussed with the patient. Return to normal activities tomorrow. Written discharge instructions were provided to the patient.  - Resume previous diet.  - Continue present medications.  - Await pathology results.  - Repeat colonoscopy is recommended. The     colonoscopy date will be determined after pathology     results from today's exam become available for     review.  YOU HAD AN ENDOSCOPIC PROCEDURE TODAY AT [facility]:   Refer to the procedure report that was given to you for any specific questions about what was found during the examination.  If the procedure report does not answer your questions, please call your gastroenterologist to clarify.  If you requested that your care partner not be given the details of your procedure findings, then the procedure report has been included in a sealed envelope for you to review at your convenience later.  YOU SHOULD EXPECT: Some feelings of bloating in the abdomen. Passage of more gas than usual.  Walking can help get rid of the air that was put into your GI tract during the procedure and reduce the bloating. If you had a lower endoscopy (such as a colonoscopy or flexible sigmoidoscopy) you may notice spotting of blood in your stool or on the toilet paper. If you underwent a bowel prep for your procedure, you may not have a normal bowel movement for a few days.  Please Note:  You might notice some irritation and congestion in your nose or some drainage.  This is from the oxygen used during your procedure.  There is no need for concern and it should clear up in a day or so.  SYMPTOMS TO REPORT IMMEDIATELY:  Following lower endoscopy (colonoscopy or flexible sigmoidoscopy):  Excessive amounts of blood in the stool  Significant tenderness or worsening of abdominal pains  Swelling of the abdomen that is new,  acute  Fever of 100F or higher   For urgent or emergent issues, a gastroenterologist can be reached at any hour by calling [phone]. Do not use MyChart messaging for urgent concerns.    DIET:  We do recommend a small meal at first, but then you may proceed to your regular diet.  Drink plenty of fluids but you should avoid alcoholic beverages for 24 hours.  ACTIVITY:  You should plan to take it easy for the rest of today and you should NOT DRIVE or use heavy machinery until tomorrow (because of the sedation medicines used during the test).    FOLLOW UP: Our staff will call the number listed on your records the next business day following your procedure.  We will call around 7:15- 8:00 am to check on you and address any questions or concerns that you may have regarding the information given to you following your procedure. If we do not reach you, we will leave a message.     If any biopsies were taken you will be contacted by phone or by letter within the next 1-3 weeks.  Please call us at [phone] if you have not heard about the biopsies in 3 weeks.    SIGNATURES/CONFIDENTIALITY: You and/or your care partner have signed paperwork which will be entered into your electronic medical record.  These signatures attest to the fact that that the information above on your After Visit Summary has been reviewed and is understood.  Full responsibility of the  confidentiality of this discharge information lies with you and/or your care-partner. 

## 2022-05-09 NOTE — Progress Notes (Signed)
No changes to clinical history since GI office visit on 05/02/22.  The patient is appropriate for an endoscopic procedure in the ambulatory setting.  - Wilfrid Lund, MD

## 2022-05-09 NOTE — Op Note (Signed)
[facility] [name] Procedure Date: 05/09/2022 1:40 PM MRN: [id number] Endoscopist: [place]. Loletha Carrow , MD, [id number] Age: 67 Referring MD:  Date of Birth: Sep 04, 1955 Gender: Female Account #: [id number] Procedure:                Colonoscopy Indications:              Chronic diarrhea, Rectal bleeding Medicines:                Monitored Anesthesia Care Procedure:                Pre-Anesthesia Assessment:                           - Prior to the procedure, a History and Physical                            was performed, and patient medications and                            allergies were reviewed. The patient's tolerance of                            previous anesthesia was also reviewed. The risks                            and benefits of the procedure and the sedation                            options and risks were discussed with the patient.                            All questions were answered, and informed consent                            was obtained. Prior Anticoagulants: The patient has                            taken no anticoagulant or antiplatelet agents. ASA                            Grade Assessment: II - A patient with mild systemic                            disease. After reviewing the risks and benefits,                            the patient was deemed in satisfactory condition to                            undergo the procedure.                           After obtaining informed consent, the colonoscope  was passed under direct vision. Throughout the                            procedure, the patient's blood pressure, pulse, and                            oxygen saturations were monitored continuously. The                            CF [id number] was introduced through the anus                            and advanced to the the terminal ileum, with                            identification of the  appendiceal orifice and IC                            valve. The colonoscopy was performed without                            difficulty. The patient tolerated the procedure                            well. The quality of the bowel preparation was                            excellent. The terminal ileum, ileocecal valve,                            appendiceal orifice, and rectum were photographed. Scope In: 1:53:29 PM Scope Out: 2:13:30 PM Scope Withdrawal Time: 0 hours 16 minutes 34 seconds  Total Procedure Duration: 0 hours 20 minutes 1 second  Findings:                 The digital rectal exam revealed a hard and nodular                            anal mass. The mass was non-circumferential and                            located predominantly at the posterior bowel wall.                           The terminal ileum appeared normal.                           Normal mucosa was found in the entire colon.                            Biopsies for histology were taken with a cold                            forceps from the ascending colon,  transverse colon                            and descending colon for evaluation of microscopic                            colitis.                           A fungating, infiltrative, friable and ulcerated                            non-obstructing mass was found at the anus                            (primarily posterior). The mass was                            non-circumferential. Biopsies were taken with a                            cold forceps for histology.                           The exam was otherwise without abnormality on                            direct and retroflexion views. Complications:            No immediate complications. Estimated Blood Loss:     Estimated blood loss was minimal. Impression:               - Anal mass.                           - The examined portion of the ileum was normal.                           - Normal mucosa  in the entire examined colon.                            Biopsied.                           - Tumor at the anus. Biopsied.                           - The examination was otherwise normal on direct                            and retroflexion views. Recommendation:           - Patient has a contact number available for                            emergencies. The signs and symptoms of potential  delayed complications were discussed with the                            patient. Return to normal activities tomorrow.                            Written discharge instructions were provided to the                            patient.                           - Resume previous diet.                           - Continue present medications.                           - Await pathology results.                           - Repeat colonoscopy is recommended. The                            colonoscopy date will be determined after pathology                            results from today's exam become available for                            review. Delmos Velaquez L. Loletha Carrow, MD 05/09/2022 2:20:11 PM This report has been signed electronically.

## 2022-05-10 ENCOUNTER — Telehealth: Payer: Self-pay

## 2022-05-10 DIAGNOSIS — K6289 Other specified diseases of anus and rectum: Secondary | ICD-10-CM

## 2022-05-10 NOTE — Telephone Encounter (Signed)
I spoke to Dr. Saralyn Pilar earlier today, and I spoke to this patient by phone with the results.  She is aware that we will schedule a CT scan chest abdomen and pelvis for staging (she has a recent BUN/creatinine on file), and also send a referral to oncology.  I will send a separate staff message to oncology to hopefully expedite her consultation with them.   - HD

## 2022-05-10 NOTE — Telephone Encounter (Signed)
  Follow up Call-     05/09/2022    1:31 PM  Call back number  Post procedure Call Back phone  # [phone]  Permission to leave phone message Yes     Patient questions:  Do you have a fever, pain , or abdominal swelling? No. Pain Score  0 *  Have you tolerated food without any problems? Yes.    Have you been able to return to your normal activities? Yes.    Do you have any questions about your discharge instructions: Diet   No. Medications  No. Follow up visit  No.  Do you have questions or concerns about your Care? No.  Actions: * If pain score is 4 or above: No action needed, pain <4.

## 2022-05-10 NOTE — Telephone Encounter (Signed)
Received a call from Dr. Saralyn Pilar with Pathology. He called regarding biopsy results of anal mass. 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 Dr. Saralyn Pilar is running special stains to determine if this is squamous or adeno. If you have any additional questions Dr. Saralyn Pilar can be reached at his office: [phone] or cell: [phone]. Thanks

## 2022-05-11 ENCOUNTER — Encounter: Payer: Self-pay | Admitting: *Deleted

## 2022-05-11 NOTE — Addendum Note (Signed)
Addended by: Yevette Edwards on: 05/11/2022 08:34 AM   Modules accepted: Orders

## 2022-05-11 NOTE — Telephone Encounter (Signed)
CT order in epic. Secure staff message sent to radiology scheduling to contact patient to set up CT scan. Ambulatory referral to Oncology in epic.

## 2022-05-11 NOTE — Progress Notes (Signed)
PATIENT NAVIGATOR PROGRESS NOTE  Name: Alyssa Deleon Date: 05/11/2022 MRN: [id number]  DOB: Jul 02, 1955   Reason for visit:  New patient appointment  Comments:  Called and spoke with patient regarding New Patient appt, Scheduled to see Dr Benay Spice on Friday, 2/16 at 1:40 pm. Reviewed directions to building and parking and one support person allowed in the appt. Verbalized understanding    Time spent counseling/coordinating care: 30-45 minutes

## 2022-05-12 ENCOUNTER — Ambulatory Visit (HOSPITAL_BASED_OUTPATIENT_CLINIC_OR_DEPARTMENT_OTHER)
Admission: RE | Admit: 2022-05-12 | Discharge: 2022-05-12 | Disposition: A | Discharge status: Home / Self Care | Payer: Medicare Other | Source: Ambulatory Visit | Attending: Gastroenterology | Admitting: Gastroenterology

## 2022-05-12 ENCOUNTER — Encounter (HOSPITAL_BASED_OUTPATIENT_CLINIC_OR_DEPARTMENT_OTHER): Payer: Self-pay

## 2022-05-12 ENCOUNTER — Encounter: Payer: Self-pay | Admitting: Physician Assistant

## 2022-05-12 DIAGNOSIS — C20 Malignant neoplasm of rectum: Secondary | ICD-10-CM | POA: Insufficient documentation

## 2022-05-12 DIAGNOSIS — E041 Nontoxic single thyroid nodule: Secondary | ICD-10-CM | POA: Insufficient documentation

## 2022-05-12 DIAGNOSIS — Z853 Personal history of malignant neoplasm of breast: Secondary | ICD-10-CM | POA: Insufficient documentation

## 2022-05-12 DIAGNOSIS — K6289 Other specified diseases of anus and rectum: Secondary | ICD-10-CM

## 2022-05-12 MED ORDER — IOHEXOL 300 MG/ML  SOLN
100.0000 mL | Freq: Once | INTRAMUSCULAR | Status: AC | PRN
  Administered 2022-05-12: 85 mL via INTRAVENOUS

## 2022-05-12 NOTE — Telephone Encounter (Addendum)
CT scheduled for Thursdaay, 05/12/22 at 4:30 pm. Oncology appt with Dr. Benay Spice on 05/13/22.

## 2022-05-13 ENCOUNTER — Inpatient Hospital Stay: Payer: Medicare Other | Attending: Oncology | Admitting: Oncology

## 2022-05-13 ENCOUNTER — Encounter: Payer: Self-pay | Admitting: *Deleted

## 2022-05-13 VITALS — BP 129/69 | HR 96 | Temp 98.1°F | Resp 18 | Ht 67.0 in | Wt 182.6 lb

## 2022-05-13 DIAGNOSIS — Z800 Family history of malignant neoplasm of digestive organs: Secondary | ICD-10-CM | POA: Diagnosis not present

## 2022-05-13 DIAGNOSIS — Z801 Family history of malignant neoplasm of trachea, bronchus and lung: Secondary | ICD-10-CM | POA: Diagnosis not present

## 2022-05-13 DIAGNOSIS — Z87891 Personal history of nicotine dependence: Secondary | ICD-10-CM

## 2022-05-13 DIAGNOSIS — C210 Malignant neoplasm of anus, unspecified: Secondary | ICD-10-CM

## 2022-05-13 DIAGNOSIS — Z853 Personal history of malignant neoplasm of breast: Secondary | ICD-10-CM

## 2022-05-13 DIAGNOSIS — Z803 Family history of malignant neoplasm of breast: Secondary | ICD-10-CM

## 2022-05-13 NOTE — Progress Notes (Signed)
Referral to CSW

## 2022-05-13 NOTE — Progress Notes (Signed)
Provided bag of groceries and CSW referral placed

## 2022-05-13 NOTE — Progress Notes (Signed)
PATIENT NAVIGATOR PROGRESS NOTE  Name: Alyssa Deleon Date: 05/13/2022 MRN: [id number]  DOB: 10/05/1955   Reason for visit:  New patient appt  Comments:  Patient had appt with Dr Benay Spice today  Referral for PET scan placed, awaiting PA for scheduling Referral made to colorectal surgeon Dr Marcello Moores for surgical consult Referral made to [facility] for evaluation with chemo/rad start date of 3/4 Order placed for PICC line on 3/1 Referral made to SW Will continue to monitor and update     Time spent counseling/coordinating care: > 60 minutes

## 2022-05-13 NOTE — Progress Notes (Signed)
Already on anti-depressant therapy. Denies any suicidal ideations. Receptive to speaking w/CSW

## 2022-05-13 NOTE — Progress Notes (Signed)
Has her daughter and a friend that can provide transportation at times.

## 2022-05-13 NOTE — Progress Notes (Signed)
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

## 2022-05-13 NOTE — Progress Notes (Signed)
[facility] New Patient Consult   Requesting MD: Doran Stabler, Md [address] [place],  [place] [address]   [place]ON[place] CORNISH 67 y.o.  1956/03/14    Reason for Consult: Anal cancer   HPI: Ms. Blom reports diarrhea and "bloody "stool for the past 2 months.  She was referred to Dr. Loletha Carrow and was taken to a colonoscopy on 05/09/2022.  An anal mass was noted on rectal exam.  A friable ulcerated mass was found at the anus posteriorly.  The colon appeared normal. The pathology from a random colon biopsy was unremarkable.  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  The Ki-67 proliferation rate is increased.  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    Past Medical History:  Diagnosis Date   Breast cancer ([facility])    Cataract, left 09/29/2017   Chronic pain syndrome, on Neurontin 09/30/2017   Dry eye syndrome of both eyes, on Restasis 09/30/2017   Emphysema lung ([place])    Family history of breast cancer    Family history of melanoma    Family history of ovarian cancer    Family history of prostate cancer    Family history of throat cancer    Frequent urinary tract infections    GAD (generalized anxiety disorder) 03/11/2015   Genital herpes    History of chicken pox    History of degenerative disc disease 10/27/2002   Kidney stone    Knee osteonecrosis, 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  G2 P1, Ab1  Past Surgical History:  Procedure Laterality Date   AUGMENTATION MAMMAPLASTY Bilateral    BREAST BIOPSY     KIDNEY STONE SURGERY  01/2022   MASTECTOMY Left 1995   has a tram flap   RECONSTRUCTION BREAST W/ TRAM FLAP Left 2003    Medications: Reviewed  Allergies:  Allergies  Allergen Reactions   Hydrocodone-Acetaminophen Shortness Of Breath    SOB   Klonopin [Clonazepam] Swelling   Morphine Sulfate Itching   Rosuvastatin Other (See Comments)    Myalgias  Muscle cramps    Myalgias   Venlafaxine Other (See Comments)    Weight gain   Grass Pollen(K-O-R-T-Swt Vern) Other (See Comments)   Seasonal Ic [Cholestatin]     Pollen, sinus coughing etc.   Ezetimibe-Simvastatin Other (See Comments)    Myalgias  Causes cramps   Sulfa Antibiotics Diarrhea and Other (See Comments)    unknown    Stomach ache    Family history: Her father had esophagus and prostate cancer.  Her brother had esophagus cancer.  A sister had lung cancer.  Her maternal grandmother, her mother, and 2 sisters had breast cancer.  Social History:   She lives alone in [place].  She is retired from the police department since 2002.  She is not aware  of a transfusion history.  No risk factor for hepatitis.  She quit smoking in the remote past  ROS:   Positives include: Intermittent rectal bleeding with mucus  A complete ROS was otherwise negative.  Physical Exam:  Blood pressure 129/69, pulse 96, temperature 98.1 F (36.7 C), temperature source Oral, resp. rate 18, height 5' 7"$  (1.702 m), weight 182 lb 9.6 oz (82.8 kg), SpO2 100 %.  HEENT: Edentulous, oropharynx without visible mass, neck without mass Lungs: Clear bilaterally Cardiac: Regular rate and rhythm Abdomen: No hepatosplenomegaly, nontender, no mass Rectal: Firm mass beginning at the anal sphincter and  extending proximally for a few centimeters.  The mass is centered at the posterior anal canal to the right of midline Vascular: No leg edema Lymph nodes: No cervical, supraclavicular, axillary, or inguinal nodes Neurologic: Alert and oriented, the motor exam appears intact in the upper and lower extremities bilaterally Skin: No rash Breast: Status post left mastectomy with TRAM reconstruction.  No evidence for chest wall tumor recurrence.  Right breast without mass Musculoskeletal: No spine tenderness   LAB:  CBC  Lab Results  Component Value Date   WBC 6.3 04/28/2022   HGB 12.3 04/28/2022   HCT 38.2 04/28/2022   MCV 94.8 04/28/2022   PLT 224 04/28/2022   NEUTROABS 4.4 04/28/2022        CMP  Lab Results  Component Value Date   NA 142 04/28/2022   K 4.5 04/28/2022   CL 107 04/28/2022   CO2 28 04/28/2022   GLUCOSE 107 (H) 04/28/2022   BUN 18 04/28/2022   CREATININE 0.69 04/28/2022   CALCIUM 8.3 (L) 04/28/2022   PROT 7.6 04/28/2022   ALBUMIN 4.8 04/28/2022   AST 18 04/28/2022   ALT 11 04/28/2022   ALKPHOS 63 04/28/2022   BILITOT 0.7 04/28/2022   GFRNONAA >60 04/28/2022     No results found for: "CEA1", "[id number]", "CA125"  Imaging:  CT CHEST ABDOMEN PELVIS W CONTRAST  Result Date: 05/13/2022 CLINICAL DATA:  Rectal carcinoma. Additional history of LEFT breast cancer. Staging renal mass. * Tracking Code: BO * EXAM: CT CHEST, ABDOMEN, AND PELVIS WITH CONTRAST TECHNIQUE: Multidetector CT imaging of the chest, abdomen and pelvis was performed following the standard protocol during bolus administration of intravenous contrast. RADIATION DOSE REDUCTION: This exam was performed according to the departmental dose-optimization program which includes automated exposure control, adjustment of the mA and/or kV according to patient size and/or use of iterative reconstruction technique. CONTRAST:  46m OMNIPAQUE IOHEXOL 300 MG/ML  SOLN COMPARISON:  None Available. FINDINGS: CT CHEST  FINDINGS Cardiovascular: No significant vascular findings. Normal heart size. No pericardial effusion. Mediastinum/Nodes: No axillary or supraclavicular adenopathy. No mediastinal or hilar adenopathy. No pericardial fluid. Esophagus normal. 14 mm ovoid hypodensity in the RIGHT lobe of the thyroid gland. Lungs/Pleura: Centrilobular emphysema in the upper lobes. No suspicious pulmonary nodules. Several partially calcified foci of pleural thickening are present in the RIGHT hemithorax. Example RIGHT upper lobe pleural plaque on image 56/4). RIGHT lower lobe pleural plaque on image 121/4. Musculoskeletal: No aggressive osseous lesion. Subglandular breast implants noted. CT ABDOMEN AND PELVIS FINDINGS Hepatobiliary: 4 mm hypodensity inferior LEFT hepatic lobe (image 73/2). 4 mm hypodensities subcapsular anterior LEFT hepatic lobe on image 56. Gallbladder normal. Pancreas: Pancreas is normal. No ductal dilatation. No pancreatic inflammation. Spleen: Normal spleen Adrenals/urinary tract: Adrenal glands and kidneys are normal. The ureters and bladder normal. Stomach/Bowel: The stomach, duodenum, and small bowel normal. Appendix normal. Ascending and transverse colon normal.  Descending colon and sigmoid colon normal. Asymmetry along the posterior RIGHT wall of the distal rectum extending into the inguinal canal measuring 2.1 x 1.4 cm (image 109/2). No lymph nodes in the mesorectal sheath. No presacral adenopathy. No mesenteric adenopathy or retroperitoneal Vascular/Lymphatic: Abdominal aorta is normal caliber with atherosclerotic calcification. There is no retroperitoneal or periportal lymphadenopathy. No pelvic lymphadenopathy. Reproductive: Post hysterectomy.  Adnexa unremarkable Other: Post breast reconstruction anatomy with absence of RIGHT rectus muscle. No peritoneal nodularity. Musculoskeletal: No aggressive osseous lesion. Compression fractures in the thoracic spine are chronic. IMPRESSION: CHEST IMPRESSION: 1. No  evidence of thoracic metastasis. 2. Calcified pleural plaques in the RIGHT hemithorax. 3. RIGHT 1.4 cm incidental thyroid nodule. No follow-up imaging is recommended. Reference: J Am Coll Radiol. 2015 Feb;12(2): 143-50 PELVIS IMPRESSION: 1. Asymmetric thickening along the posterior RIGHT wall of the distal rectum extending into the anal canal. Findings correspond anal mass identified on colonoscopy. 2. No evidence of metastatic adenopathy in the abdomen pelvis. 3. Two small hypodense lesions in the LEFT hepatic lobe are favored benign cysts. 4. No evidence of metastatic disease in the abdomen pelvis. 5. No evidence of skeletal metastasis. 6.  Emphysema (ICD10-J43.9). Electronically Signed   By: Suzy Bouchard M.D.   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  There is no clinical or radiologic evidence of metastatic disease.  I discussed the treatment of anal cancer with Ms. Canipe.  I recommend treatment with concurrent 5-FU/Mitomycin-C and radiation.  We reviewed potential toxicities associated with the 5-FU/Mitomycin-C regimen including the chance of nausea/vomiting, mucositis, diarrhea, alopecia, hematologic toxicity, infection, and bleeding.  We discussed that hemolytic uremic syndrome associated with Mitomycin-C.  We  reviewed the hyperpigmentation, sun sensitivity, hand/foot syndrome, and cardiac toxicity associated with 5-fluorouracil.  She agrees to proceed. She will be referred for PICC placement for administration of chemotherapy.  She will attend a chemotherapy teaching class.  We will refer her to radiation oncology and colorectal surgery.  The tentative plan is to initiate chemotherapy/radiation on 05/30/2022.  She will return for an office visit, further discussion, and review of the pet imaging on 05/24/2022.  A chemotherapy plan was entered today.  Betsy Coder, MD  05/13/2022, 4:47 PM

## 2022-05-14 ENCOUNTER — Ambulatory Visit (HOSPITAL_BASED_OUTPATIENT_CLINIC_OR_DEPARTMENT_OTHER): Payer: Medicare Other

## 2022-05-15 ENCOUNTER — Other Ambulatory Visit: Payer: Self-pay

## 2022-05-16 ENCOUNTER — Inpatient Hospital Stay: Payer: Medicare Other

## 2022-05-16 ENCOUNTER — Encounter: Payer: Self-pay | Admitting: Gastroenterology

## 2022-05-16 NOTE — Progress Notes (Signed)
[place] Work  Initial Assessment   Alyssa Deleon is a 67 y.o. year old female contacted by phone. Clinical Social Work was referred by nurse for assessment of psychosocial needs.   SDOH (Social Determinants of Health) assessments performed: Yes SDOH Interventions    Flowsheet Row Office Visit from 05/13/2022 in [place] at [place] Interventions Other (Comment)  Housing Interventions AMB Referral  Transportation Interventions Other (Comment)  Utilities Interventions Intervention Not Indicated       SDOH Screenings   Food Insecurity: Food Insecurity Present (05/13/2022)  Housing: High Risk (05/13/2022)  Transportation Needs: Unmet Transportation Needs (05/13/2022)  Utilities: Not At Risk (05/13/2022)  Depression (PHQ2-9): Medium Risk (05/13/2022)  Financial Resource Strain: Low Risk  (06/17/2021)  Physical Activity: Inactive (06/17/2021)  Social Connections: Socially Isolated (06/17/2021)  Stress: No Stress Concern Present (06/17/2021)  Tobacco Use: Medium Risk (05/12/2022)     Distress Screen completed: No     No data to display            Family/Social Information:  Housing Arrangement: patient lives alone.  Family members/support persons in your life? Family Transportation concerns: yes.  She has a medical issue with her eyes and her ability to drive is very limited. Employment: Retired Patient retired from the [employer] source: Public affairs consultant and [place] concerns: Yes, current concerns Type of concern: Food.  She is limiting her food budget to pay for other bills. Food access concerns: She can obtain food. Religious or spiritual practice: Yes Services Currently in place:  [employer]  Coping/ Adjustment to diagnosis: Patient understands treatment plan and what happens next? yes Concerns about diagnosis and/or treatment: I'm  not especially worried about anything Patient reported stressors: Finances Hopes and/or priorities: Her family. Patient enjoys time with family/ friends Current coping skills/ strengths: Active sense of humor , Capable of independent living , Communication skills , General fund of knowledge , and Supportive family/friends     SUMMARY: Current SDOH Barriers:  Financial constraints related to her current bills.  Clinical Social Work Clinical Goal(s):  Explore community resource options for unmet needs related to:  Food Insecurity   Interventions: Discussed common feeling and emotions when being diagnosed with cancer, and the importance of support during treatment Informed patient of the support team roles and support services at [facility] Provided [place] contact information and encouraged patient to call with any questions or concerns Provided patient with information about food pantries.   Follow Up Plan: CSW will follow-up with patient by phone  Patient verbalizes understanding of plan: Yes    Rodman Pickle Alyssa Sampey, LCSW

## 2022-05-17 ENCOUNTER — Encounter: Payer: Self-pay | Admitting: *Deleted

## 2022-05-17 NOTE — Progress Notes (Unsigned)
Per [facility] scheduling:  1st available PET scan 05/30/22. Scheduled for lab/OV with Dr. Benay Spice on 2/27 and consult with RT on 2/28. PICC line scheduled for 05/27/22.

## 2022-05-19 ENCOUNTER — Telehealth: Payer: Self-pay | Admitting: *Deleted

## 2022-05-19 NOTE — Telephone Encounter (Signed)
Confirmed w/patient that PET scan had to be moved to 3/4 due to availability. Will move lab/OV from 2/27 to 3/11 when her chemo and RT will start. She agrees to chemo education session on 3/08 at 10:00, same day as the PICC line insertion on 3/08. Informed her she will not need to do anything to the PICC, just keep clean and dry. Scheduling message sent. Radiation oncology aware of treatment start date as well.

## 2022-05-20 ENCOUNTER — Encounter: Payer: Self-pay | Admitting: Physician Assistant

## 2022-05-23 ENCOUNTER — Encounter: Payer: Self-pay | Admitting: Physician Assistant

## 2022-05-23 ENCOUNTER — Telehealth: Payer: Self-pay

## 2022-05-23 ENCOUNTER — Ambulatory Visit: Payer: Medicare Other | Admitting: Gastroenterology

## 2022-05-23 NOTE — Telephone Encounter (Signed)
Patient sent a MyChart message on Friday, please see msg and advise patient. I didn't want to leave it sitting over the weekend and not respond. Thanks

## 2022-05-24 ENCOUNTER — Inpatient Hospital Stay: Payer: Medicare Other | Admitting: Oncology

## 2022-05-24 ENCOUNTER — Inpatient Hospital Stay: Payer: Medicare Other

## 2022-05-24 ENCOUNTER — Encounter (HOSPITAL_COMMUNITY): Admission: RE | Admit: 2022-05-24 | Payer: Medicare Other | Source: Ambulatory Visit

## 2022-05-24 NOTE — Telephone Encounter (Signed)
Please see pt msg and advise 

## 2022-05-24 NOTE — Telephone Encounter (Signed)
Pt has appt in office for Thursday

## 2022-05-24 NOTE — Progress Notes (Signed)
Radiation Oncology         [phone] ________________________________  Name: Alyssa Deleon        MRN: [id number]  Date of Service: 05/25/2022 DOB: 17-Dec-1955  CC:Allwardt, Randa Evens, PA-C  Ladell Pier, MD     REFERRING PHYSICIAN: Ladell Pier, MD   DIAGNOSIS: The encounter diagnosis was Anal cancer [facility]).   HISTORY OF PRESENT ILLNESS: Alyssa Deleon is a 67 y.o. female seen at the request of Dr. Benay Spice for a new diagnosis of anal cancer. The patient presented to GI after complaints of rectal bleeding for several months. This prompted colonoscopy on 05/09/22 that showed a mass at the anal verge also noted on DRE. 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  A CT CAP on 05/12/22 showed right sided distal rectal thickening without adenopathy or evidence of distant disease. She had two lesions in the liver favored to be cysts,  pleural plaque in the right hemithorax, and a thyroid nodule. She's scheduled for a PET scan on 05/30/22 and is seen today to discuss treatment of her cancer.   PREVIOUS RADIATION THERAPY: {EXAM; YES/NO:19492::"No"} Left breast cancer 1995***   PAST MEDICAL HISTORY:  Past Medical History:  Diagnosis Date   Breast cancer ([place])    Cataract, left 09/29/2017   Chronic pain syndrome, on Neurontin 09/30/2017   Dry eye syndrome of both eyes, on Restasis 09/30/2017   Emphysema lung ([place])    Family history of breast cancer    Family history of melanoma    Family history of ovarian cancer    Family history of prostate cancer    Family history of throat cancer    Frequent urinary tract infections    GAD (generalized anxiety disorder) 03/11/2015   Genital herpes    History of chicken pox    History of degenerative disc disease 10/27/2002   Kidney stone    Knee osteonecrosis, 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   Major depression, recurrent, chronic ([place]),  followed by Psych, on Wellbutrin, Cymbalta 09/30/2017   Mixed hyperlipidemia, on Zetia and Zocor 09/30/2017   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   MVP (mitral valve prolapse) 03/13/2015   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   Nuclear sclerosis 06/10/2013   Panic disorder, followed by Psych, on Xanax 09/30/2017   Personal history of breast cancer    Psoriasis    PTSD (post-traumatic stress disorder) 03/11/2015   Stroke ([place])    Total knee replacement status, 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   Vitamin D deficiency 09/30/2017       PAST SURGICAL HISTORY: Past Surgical History:  Procedure Laterality Date   AUGMENTATION MAMMAPLASTY Bilateral    BREAST BIOPSY     KIDNEY STONE SURGERY  01/2022   MASTECTOMY Left 1995   has a tram flap   RECONSTRUCTION BREAST W/ TRAM FLAP Left 2003     FAMILY HISTORY:  Family History  Problem Relation Age of Onset   Cancer - Other Mother    Miscarriages / [place] Mother    Alzheimer's disease Mother    Breast cancer Mother    Skin cancer Mother    Cancer - Prostate Father        metastatic   Hyperlipidemia Father    Throat cancer Father    Cancer - Other Sister    Depression Sister    Hearing loss Sister    Hyperlipidemia Sister    Breast cancer  Sister        both breasts   Skin cancer Sister    Cancer - Other Sister    Breast cancer Sister 33   Cancer - Other Brother    Throat cancer Brother    Breast cancer Paternal Aunt    Prostate cancer Paternal Uncle    Breast cancer Maternal Grandmother    Alzheimer's disease Maternal Grandfather    Ovarian cancer Paternal Grandmother    Heart attack Paternal Grandfather    Breast cancer Other        both breasts dx 84   Liver disease Neg Hx    Colon cancer Neg Hx    Esophageal cancer Neg Hx    Rectal cancer Neg Hx    Stomach cancer Neg Hx      SOCIAL HISTORY:  reports that she quit smoking about 18 months ago. Her smoking use included cigarettes. She has a 31.50 pack-year  smoking history. She has been exposed to tobacco smoke. She has never used smokeless tobacco. She reports current alcohol use. She reports that she does not use drugs.   ALLERGIES: Hydrocodone-acetaminophen, Klonopin [clonazepam], Morphine sulfate, Rosuvastatin, Venlafaxine, Grass pollen(k-o-r-t-swt vern), Seasonal ic [cholestatin], Ezetimibe-simvastatin, and Sulfa antibiotics   MEDICATIONS:  Current Outpatient Medications  Medication Sig Dispense Refill   ALPRAZolam (XANAX) 1 MG tablet TAKE 2 TABLETS BY MOUTH EVERY  NIGHT AT BEDTIME AS NEEDED FOR  AXIETY 180 tablet 0   aspirin EC 81 MG tablet Take 1 tablet (81 mg total) by mouth daily. Swallow whole. 30 tablet 11   buPROPion (WELLBUTRIN XL) 300 MG 24 hr tablet TAKE 1 TABLET BY MOUTH DAILY 90 tablet 3   Calcium Carbonate-Vitamin D 600-5 MG-MCG CAPS Take by mouth.     denosumab (PROLIA) 60 MG/ML SOSY injection Inject 60 mg into the skin every 6 (six) months.     DULoxetine (CYMBALTA) 60 MG capsule TAKE 1 CAPSULE BY MOUTH DAILY 90 capsule 3   ezetimibe (ZETIA) 10 MG tablet Take 1 tablet (10 mg total) by mouth daily. 90 tablet 3   gabapentin (NEURONTIN) 300 MG capsule Take by mouth.     simvastatin (ZOCOR) 40 MG tablet TAKE 1 TABLET BY MOUTH DAILY 90 tablet 3   TYRVAYA 0.03 MG/ACT SOLN Place 1 spray into both nostrils 2 (two) times daily.     No current facility-administered medications for this visit.     REVIEW OF SYSTEMS: On review of systems, the patient reports that *** is doing well overall. *** denies any chest pain, shortness of breath, cough, fevers, chills, night sweats, unintended weight changes. *** denies any bowel or bladder disturbances, and denies abdominal pain, nausea or vomiting. *** denies any new musculoskeletal or joint aches or pains. A complete review of systems is obtained and is otherwise negative.     PHYSICAL EXAM:  Wt Readings from Last 3 Encounters:  05/13/22 182 lb 9.6 oz (82.8 kg)  05/09/22 183 lb (83 kg)   05/02/22 183 lb (83 kg)   Temp Readings from Last 3 Encounters:  05/13/22 98.1 F (36.7 C) (Oral)  05/09/22 (!) 97.3 F (36.3 C)  04/28/22 98 F (36.7 C) (Oral)   BP Readings from Last 3 Encounters:  05/13/22 129/69  05/09/22 136/64  05/02/22 118/68   Pulse Readings from Last 3 Encounters:  05/13/22 96  05/09/22 71  05/02/22 94    /10  In general this is a well appearing *** in no acute distress. ***'s alert and oriented x4  and appropriate throughout the examination. Cardiopulmonary assessment is negative for acute distress and *** exhibits normal effort.     ECOG = ***  0 - Asymptomatic (Fully active, able to carry on all predisease activities without restriction)  1 - Symptomatic but completely ambulatory (Restricted in physically strenuous activity but ambulatory and able to carry out work of a light or sedentary nature. For example, light housework, office work)  2 - Symptomatic, <50% in bed during the day (Ambulatory and capable of all self care but unable to carry out any work activities. Up and about more than 50% of waking hours)  3 - Symptomatic, >50% in bed, but not bedbound (Capable of only limited self-care, confined to bed or chair 50% or more of waking hours)  4 - Bedbound (Completely disabled. Cannot carry on any self-care. Totally confined to bed or chair)  5 - Death   Eustace Pen MM, Creech RH, Tormey DC, et al. [phone]). "Toxicity and response criteria of the [facility]". [place] Oncol. 5 (6): 649-55    LABORATORY DATA:  Lab Results  Component Value Date   WBC 6.3 04/28/2022   HGB 12.3 04/28/2022   HCT 38.2 04/28/2022   MCV 94.8 04/28/2022   PLT 224 04/28/2022   Lab Results  Component Value Date   NA 142 04/28/2022   K 4.5 04/28/2022   CL 107 04/28/2022   CO2 28 04/28/2022   Lab Results  Component Value Date   ALT 11 04/28/2022   AST 18 04/28/2022   ALKPHOS 63 04/28/2022   BILITOT 0.7 04/28/2022       RADIOGRAPHY: CT CHEST ABDOMEN PELVIS W CONTRAST  Result Date: 05/13/2022 CLINICAL DATA:  Rectal carcinoma. Additional history of LEFT breast cancer. Staging renal mass. * Tracking Code: BO * EXAM: CT CHEST, ABDOMEN, AND PELVIS WITH CONTRAST TECHNIQUE: Multidetector CT imaging of the chest, abdomen and pelvis was performed following the standard protocol during bolus administration of intravenous contrast. RADIATION DOSE REDUCTION: This exam was performed according to the departmental dose-optimization program which includes automated exposure control, adjustment of the mA and/or kV according to patient size and/or use of iterative reconstruction technique. CONTRAST:  57m OMNIPAQUE IOHEXOL 300 MG/ML  SOLN COMPARISON:  None Available. FINDINGS: CT CHEST FINDINGS Cardiovascular: No significant vascular findings. Normal heart size. No pericardial effusion. Mediastinum/Nodes: No axillary or supraclavicular adenopathy. No mediastinal or hilar adenopathy. No pericardial fluid. Esophagus normal. 14 mm ovoid hypodensity in the RIGHT lobe of the thyroid gland. Lungs/Pleura: Centrilobular emphysema in the upper lobes. No suspicious pulmonary nodules. Several partially calcified foci of pleural thickening are present in the RIGHT hemithorax. Example RIGHT upper lobe pleural plaque on image 56/4). RIGHT lower lobe pleural plaque on image 121/4. Musculoskeletal: No aggressive osseous lesion. Subglandular breast implants noted. CT ABDOMEN AND PELVIS FINDINGS Hepatobiliary: 4 mm hypodensity inferior LEFT hepatic lobe (image 73/2). 4 mm hypodensities subcapsular anterior LEFT hepatic lobe on image 56. Gallbladder normal. Pancreas: Pancreas is normal. No ductal dilatation. No pancreatic inflammation. Spleen: Normal spleen Adrenals/urinary tract: Adrenal glands and kidneys are normal. The ureters and bladder normal. Stomach/Bowel: The stomach, duodenum, and small bowel normal. Appendix normal. Ascending and transverse colon  normal. Descending colon and sigmoid colon normal. Asymmetry along the posterior RIGHT wall of the distal rectum extending into the inguinal canal measuring 2.1 x 1.4 cm (image 109/2). No lymph nodes in the mesorectal sheath. No presacral adenopathy. No mesenteric adenopathy or retroperitoneal Vascular/Lymphatic: Abdominal aorta is normal caliber with atherosclerotic  calcification. There is no retroperitoneal or periportal lymphadenopathy. No pelvic lymphadenopathy. Reproductive: Post hysterectomy.  Adnexa unremarkable Other: Post breast reconstruction anatomy with absence of RIGHT rectus muscle. No peritoneal nodularity. Musculoskeletal: No aggressive osseous lesion. Compression fractures in the thoracic spine are chronic. IMPRESSION: CHEST IMPRESSION: 1. No evidence of thoracic metastasis. 2. Calcified pleural plaques in the RIGHT hemithorax. 3. RIGHT 1.4 cm incidental thyroid nodule. No follow-up imaging is recommended. Reference: J Am Coll Radiol. 2015 Feb;12(2): 143-50 PELVIS IMPRESSION: 1. Asymmetric thickening along the posterior RIGHT wall of the distal rectum extending into the anal canal. Findings correspond anal mass identified on colonoscopy. 2. No evidence of metastatic adenopathy in the abdomen pelvis. 3. Two small hypodense lesions in the LEFT hepatic lobe are favored benign cysts. 4. No evidence of metastatic disease in the abdomen pelvis. 5. No evidence of skeletal metastasis. 6.  Emphysema (ICD10-J43.9). Electronically Signed   By: Suzy Bouchard M.D.   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 Dr. Lisbeth Renshaw discusses the pathology findings and reviews the nature of anal carcinoma. Dr. Lisbeth Renshaw recommends proceeding with PET imaging and recommends chemoradiation to the pelvis if no metastatic disease is found.  We discussed the risks, benefits, short, and long term effects of radiotherapy, as well as the curative intent, and the patient is interested in  proceeding. Dr. Lisbeth Renshaw discusses the delivery and logistics of radiotherapy and anticipates a course of 6 weeks of radiotherapy.  Written consent is obtained and placed in the chart, a copy was provided to the patient. She will begin radiation on 06/06/22. 2. Risks of pelvic floor dysfunction from radiotherapy. We discussed the importance of evaluation with physical therapy prior to pelvic radiation. A referral was placed to physical therapy today. *** 3. Remote history of Left Breast Cancer.***   In a visit lasting *** minutes, greater than 50% of the time was spent face to face discussing the patient's condition, in preparation for the discussion, and coordinating the patient's care.   The above documentation reflects my direct findings during this shared patient visit. Please see the separate note by Dr. Lisbeth Renshaw on this date for the remainder of the patient's plan of care.    Carola Rhine, [facility]   **Disclaimer: This note was dictated with voice recognition software. Similar sounding words can inadvertently be transcribed and this note may contain transcription errors which may not have been corrected upon publication of note.**

## 2022-05-24 NOTE — Telephone Encounter (Signed)
Pt scheduled for office visit 05/26/22

## 2022-05-24 NOTE — Progress Notes (Signed)
GI Location of Tumor / Histology: Anal Cancer  Waylan Boga presented with complaints of rectal bleeding for several months.  CT CAP 05/12/2022: Right sided distal rectal thickening without adenopathy or evidence of distant disease.  She had two lesions in the liver favored to be cysts, pleural plaque in the right hemithorax, and a thyroid nodule.  Colonoscopy 05/09/2022: Mass at the anal verge also noted on DRE.     Biopsies of Anal Mass 05/09/2022   Past/Anticipated interventions by surgeon, if any:  Dr. Maisie Fus 05/23/2022   Past/Anticipated interventions by medical oncology, if any:  Dr. Truett Perna 05/13/2022 -I recommend treatment with concurrent 5-FU/Mitomycin-C and radiation.    Pap Smear abnormalities: None  Weight changes, if any:   Bowel/Bladder complaints, if any: She continues to have frequent loose stools, taking half tab of imodium as needed.  She reports little amounts of urination each time.  She reports constant pressure in her rectum.  Nausea / Vomiting, if any: None  Pain issues, if any:    Any blood per rectum: She reports blood in her stools, more since her last DRE.  She reports increased mucus.  SAFETY ISSUES: Prior radiation? Left Breast 1995, 7 weeks. Pacemaker/ICD? No Possible current pregnancy? Postmenopausal Is the patient on methotrexate? No  Current Complaints/Details:

## 2022-05-25 ENCOUNTER — Other Ambulatory Visit: Payer: Self-pay | Admitting: Oncology

## 2022-05-25 ENCOUNTER — Encounter: Payer: Self-pay | Admitting: Radiation Oncology

## 2022-05-25 ENCOUNTER — Ambulatory Visit
Admission: RE | Admit: 2022-05-25 | Discharge: 2022-05-25 | Disposition: A | Discharge status: Home / Self Care | Payer: Medicare Other | Source: Ambulatory Visit | Attending: Radiation Oncology | Admitting: Radiation Oncology

## 2022-05-25 ENCOUNTER — Other Ambulatory Visit: Payer: Self-pay

## 2022-05-25 VITALS — BP 121/76 | HR 94 | Temp 97.7°F | Resp 18 | Ht 67.0 in | Wt 184.5 lb

## 2022-05-25 DIAGNOSIS — E041 Nontoxic single thyroid nodule: Secondary | ICD-10-CM | POA: Insufficient documentation

## 2022-05-25 DIAGNOSIS — I341 Nonrheumatic mitral (valve) prolapse: Secondary | ICD-10-CM | POA: Diagnosis not present

## 2022-05-25 DIAGNOSIS — Z803 Family history of malignant neoplasm of breast: Secondary | ICD-10-CM | POA: Insufficient documentation

## 2022-05-25 DIAGNOSIS — Z79899 Other long term (current) drug therapy: Secondary | ICD-10-CM | POA: Insufficient documentation

## 2022-05-25 DIAGNOSIS — Z800 Family history of malignant neoplasm of digestive organs: Secondary | ICD-10-CM | POA: Insufficient documentation

## 2022-05-25 DIAGNOSIS — Z8673 Personal history of transient ischemic attack (TIA), and cerebral infarction without residual deficits: Secondary | ICD-10-CM | POA: Insufficient documentation

## 2022-05-25 DIAGNOSIS — Z7982 Long term (current) use of aspirin: Secondary | ICD-10-CM | POA: Diagnosis not present

## 2022-05-25 DIAGNOSIS — C210 Malignant neoplasm of anus, unspecified: Secondary | ICD-10-CM

## 2022-05-25 DIAGNOSIS — J432 Centrilobular emphysema: Secondary | ICD-10-CM | POA: Insufficient documentation

## 2022-05-25 DIAGNOSIS — G894 Chronic pain syndrome: Secondary | ICD-10-CM | POA: Insufficient documentation

## 2022-05-25 DIAGNOSIS — M4854XA Collapsed vertebra, not elsewhere classified, thoracic region, initial encounter for fracture: Secondary | ICD-10-CM | POA: Insufficient documentation

## 2022-05-25 DIAGNOSIS — E782 Mixed hyperlipidemia: Secondary | ICD-10-CM | POA: Insufficient documentation

## 2022-05-26 ENCOUNTER — Other Ambulatory Visit (HOSPITAL_BASED_OUTPATIENT_CLINIC_OR_DEPARTMENT_OTHER): Payer: Self-pay

## 2022-05-26 ENCOUNTER — Encounter: Payer: Self-pay | Admitting: Physician Assistant

## 2022-05-26 ENCOUNTER — Ambulatory Visit: Payer: Medicare Other | Admitting: Physician Assistant

## 2022-05-26 VITALS — BP 97/62 | HR 83 | Temp 97.3°F | Ht 67.0 in | Wt 183.0 lb

## 2022-05-26 DIAGNOSIS — G4701 Insomnia due to medical condition: Secondary | ICD-10-CM | POA: Insufficient documentation

## 2022-05-26 MED ORDER — AMITRIPTYLINE HCL 25 MG PO TABS
25.0000 mg | ORAL_TABLET | Freq: Every day | ORAL | 0 refills | Status: DC
  Filled 2022-05-26: qty 30, 30d supply, fill #0

## 2022-05-26 NOTE — Progress Notes (Signed)
Subjective:    Patient ID: Alyssa Deleon, female    DOB: 07-09-1955, 67 y.o.   MRN: [id number]  Chief Complaint  Patient presents with   Insomnia    Insomnia   Patient is in today for discussion about insomnia. New diagnosis of anal cancer.  Starts treatments on March 11. Nervous about all the changes coming up.  Goes to be around 1 am, lays there for awhile. Usually up every hour or two. Averages 3 hours per night. Feels tired but not sleepy.   Takes gabapentin 300 mg one capsule in the morning for nerve pain.Takes 1.5 mg Xanax at bedtime for anxiety.    Cymbalta 60 mg and Wellbutrin XL 300 mg doing well for her depression.   Past Medical History:  Diagnosis Date   Breast cancer ([place])    Cataract, left 09/29/2017   Chronic pain syndrome, on Neurontin 09/30/2017   Dry eye syndrome of both eyes, on Restasis 09/30/2017   Emphysema lung ([place])    Family history of breast cancer    Family history of melanoma    Family history of ovarian cancer    Family history of prostate cancer    Family history of throat cancer    Frequent urinary tract infections    GAD (generalized anxiety disorder) 03/11/2015   Genital herpes    History of chicken pox    History of degenerative disc disease 10/27/2002   Kidney stone    Knee osteonecrosis, left ([place]) 09/29/2017   Major depression, recurrent, chronic ([place]), followed by Psych, on Wellbutrin, Cymbalta 09/30/2017   Mixed hyperlipidemia, on Zetia and Zocor 09/30/2017   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   MVP (mitral valve prolapse) 03/13/2015   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   Nuclear sclerosis 06/10/2013   Panic disorder, followed by Psych, on Xanax 09/30/2017   Personal history of breast cancer    Psoriasis    PTSD (post-traumatic stress disorder) 03/11/2015   Stroke ([place])    Total knee replacement status, 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   Vitamin D deficiency 09/30/2017    Past Surgical History:  Procedure  Laterality Date   AUGMENTATION MAMMAPLASTY Bilateral    BREAST BIOPSY     KIDNEY STONE SURGERY  01/2022   MASTECTOMY Left 1995   has a tram flap   RECONSTRUCTION BREAST W/ TRAM FLAP Left 2003    Family History  Problem Relation Age of Onset   Cancer - Other Mother    Miscarriages / [place] Mother    Alzheimer's disease Mother    Breast cancer Mother    Skin cancer Mother    Cancer - Prostate Father        metastatic   Hyperlipidemia Father    Throat cancer Father    Cancer - Other Sister    Depression Sister    Hearing loss Sister    Hyperlipidemia Sister    Breast cancer Sister        both breasts   Skin cancer Sister    Cancer - Other Sister    Breast cancer Sister 89   Cancer - Other Brother    Throat cancer Brother    Breast cancer Paternal Aunt    Prostate cancer Paternal Uncle    Breast cancer Maternal Grandmother    Alzheimer's disease Maternal Grandfather    Ovarian cancer Paternal Grandmother    Heart attack Paternal Grandfather    Breast cancer Other        both breasts dx 66  Liver disease Neg Hx    Colon cancer Neg Hx    Esophageal cancer Neg Hx    Rectal cancer Neg Hx    Stomach cancer Neg Hx     Social History   Tobacco Use   Smoking status: Former    Packs/day: 0.75    Years: 42.00    Total pack years: 31.50    Types: Cigarettes    Quit date: 11/06/2020    Years since quitting: 1.5    Passive exposure: Past   Smokeless tobacco: Never  Vaping Use   Vaping Use: Never used  Substance Use Topics   Alcohol use: Yes    Comment: Special Occasions   Drug use: Never     Allergies  Allergen Reactions   Hydrocodone-Acetaminophen Shortness Of Breath    SOB   Klonopin [Clonazepam] Swelling   Morphine Sulfate Itching   Rosuvastatin Other (See Comments)    Myalgias  Muscle cramps    Myalgias   Venlafaxine Other (See Comments)    Weight gain   Grass Pollen(K-O-R-T-Swt Vern) Other (See Comments)   Seasonal Ic [Cholestatin]      Pollen, sinus coughing etc.   Ezetimibe-Simvastatin Other (See Comments)    Myalgias  Causes cramps   Sulfa Antibiotics Diarrhea and Other (See Comments)    unknown    Stomach ache    Review of Systems  Psychiatric/Behavioral:  The patient has insomnia.    NEGATIVE UNLESS OTHERWISE INDICATED IN HPI      Objective:     BP 97/62 (BP Location: Right Arm, Patient Position: Sitting)   Pulse 83   Temp (!) 97.3 F (36.3 C) (Temporal)   Ht '5\' 7"'$  (1.702 m)   Wt 183 lb (83 kg)   SpO2 99%   BMI 28.66 kg/m   Wt Readings from Last 3 Encounters:  05/26/22 183 lb (83 kg)  05/25/22 184 lb 8 oz (83.7 kg)  05/13/22 182 lb 9.6 oz (82.8 kg)    BP Readings from Last 3 Encounters:  05/26/22 97/62  05/25/22 121/76  05/13/22 129/69     Physical Exam Vitals and nursing note reviewed.  Constitutional:      Appearance: Normal appearance.  Eyes:     Extraocular Movements: Extraocular movements intact.     Conjunctiva/sclera: Conjunctivae normal.     Pupils: Pupils are equal, round, and reactive to light.  Cardiovascular:     Rate and Rhythm: Normal rate and regular rhythm.  Pulmonary:     Effort: Pulmonary effort is normal.     Breath sounds: Normal breath sounds.  Neurological:     Mental Status: She is alert.  Psychiatric:        Mood and Affect: Mood normal.        Behavior: Behavior normal.        Assessment & Plan:  Insomnia due to medical condition Assessment & Plan: Started after her CVA history; now recently worse because of new anal cancer diagnosis. She did well with Amitriptyline prior to it being discontinued (we thought maybe this was causing diarrhea, when in fact it was actually her new cancer). Will start back on Amitriptyline 25 mg at bedtime. She can do 1/2 to 1 tab. She'll let me know how she's doing with this. Sleep hygiene in AVS.    Other orders -     Amitriptyline HCl; Take 1 tablet (25 mg total) by mouth at bedtime. Take to help with sleep.   Dispense: 30 tablet; Refill: 0  Return if symptoms worsen or fail to improve.     Shaiden Aldous M Atanacio Melnyk, PA-C

## 2022-05-26 NOTE — Assessment & Plan Note (Signed)
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 She did well with Amitriptyline prior to it being discontinued (we thought maybe this was causing diarrhea, when in fact it was actually her new cancer). Will start back on Amitriptyline 25 mg at bedtime. She can do 1/2 to 1 tab. She'll let me know how she's doing with this. Sleep hygiene in AVS.

## 2022-05-26 NOTE — Patient Instructions (Signed)
Trial amitriptyline 25 mg at bedtime to help with insomnia. You may also take OTC Melatonin a few hours before bedtime every night.  Keep a regular sleep pattern.   Please work on the following to help with sleep:  -Sleep only long enough to feel rested then get out of bed -Go to bed and get up at the same time every day. -Do not try to force yourself to sleep. If you can't sleep, get out of bed and try again later. -Have coffee, tea, and other foods that have caffeine only in the morning. -Avoid alcohol -Keep your bedroom dark, cool, quiet, and free of reminders of work or other things that cause you stress -Exercise several days a week, but not right before bed -Avoid looking at phones or reading devices ("e-books") that give off light before bed. This can make it harder to fall asleep

## 2022-05-27 ENCOUNTER — Other Ambulatory Visit (HOSPITAL_COMMUNITY): Payer: Medicare Other

## 2022-05-30 ENCOUNTER — Other Ambulatory Visit: Payer: Medicare Other

## 2022-05-30 ENCOUNTER — Encounter (HOSPITAL_COMMUNITY)
Admission: RE | Admit: 2022-05-30 | Discharge: 2022-05-30 | Disposition: A | Discharge status: Home / Self Care | Payer: Medicare Other | Source: Ambulatory Visit | Attending: Oncology | Admitting: Oncology

## 2022-05-30 DIAGNOSIS — C210 Malignant neoplasm of anus, unspecified: Secondary | ICD-10-CM | POA: Diagnosis not present

## 2022-05-30 LAB — GLUCOSE, CAPILLARY: Glucose-Capillary: 94 mg/dL (ref 70–99)

## 2022-05-30 MED ORDER — FLUDEOXYGLUCOSE F - 18 (FDG) INJECTION
9.1000 | Freq: Once | INTRAVENOUS | Status: AC
  Administered 2022-05-30: 9.1 via INTRAVENOUS

## 2022-05-31 ENCOUNTER — Encounter: Payer: Medicare Other | Admitting: Gastroenterology

## 2022-05-31 ENCOUNTER — Other Ambulatory Visit: Payer: Self-pay

## 2022-05-31 ENCOUNTER — Ambulatory Visit
Admission: RE | Admit: 2022-05-31 | Discharge: 2022-05-31 | Disposition: A | Discharge status: Home / Self Care | Payer: Medicare Other | Source: Ambulatory Visit | Attending: Radiation Oncology | Admitting: Radiation Oncology

## 2022-05-31 DIAGNOSIS — Z5111 Encounter for antineoplastic chemotherapy: Secondary | ICD-10-CM | POA: Insufficient documentation

## 2022-05-31 DIAGNOSIS — Z510 Encounter for antineoplastic radiation therapy: Secondary | ICD-10-CM | POA: Diagnosis present

## 2022-05-31 DIAGNOSIS — C210 Malignant neoplasm of anus, unspecified: Secondary | ICD-10-CM | POA: Insufficient documentation

## 2022-06-01 ENCOUNTER — Other Ambulatory Visit: Payer: Self-pay

## 2022-06-02 ENCOUNTER — Other Ambulatory Visit: Payer: Self-pay | Admitting: Physician Assistant

## 2022-06-02 ENCOUNTER — Ambulatory Visit: Payer: Medicare Other | Attending: Radiation Oncology | Admitting: Physical Therapy

## 2022-06-02 ENCOUNTER — Other Ambulatory Visit: Payer: Self-pay

## 2022-06-02 ENCOUNTER — Encounter: Payer: Self-pay | Admitting: Physician Assistant

## 2022-06-02 ENCOUNTER — Encounter: Admit: 2022-06-02 | Payer: PRIVATE HEALTH INSURANCE | Attending: Internal Medicine | Primary: Internal Medicine

## 2022-06-02 ENCOUNTER — Ambulatory Visit: Admit: 2022-06-02 | Payer: BLUE CROSS/BLUE SHIELD | Primary: Internal Medicine

## 2022-06-02 ENCOUNTER — Ambulatory Visit: Admit: 2022-06-02 | Payer: BLUE CROSS/BLUE SHIELD | Attending: Internal Medicine | Primary: Internal Medicine

## 2022-06-02 DIAGNOSIS — Z131 Encounter for screening for diabetes mellitus: Secondary | ICD-10-CM

## 2022-06-02 DIAGNOSIS — F17210 Nicotine dependence, cigarettes, uncomplicated: Secondary | ICD-10-CM

## 2022-06-02 DIAGNOSIS — Z114 Encounter for screening for human immunodeficiency virus [HIV]: Secondary | ICD-10-CM

## 2022-06-02 DIAGNOSIS — Z1159 Encounter for screening for other viral diseases: Secondary | ICD-10-CM

## 2022-06-02 DIAGNOSIS — Z0000 Encounter for general adult medical examination without abnormal findings: Secondary | ICD-10-CM

## 2022-06-02 DIAGNOSIS — M6281 Muscle weakness (generalized): Secondary | ICD-10-CM | POA: Insufficient documentation

## 2022-06-02 DIAGNOSIS — C210 Malignant neoplasm of anus, unspecified: Secondary | ICD-10-CM | POA: Insufficient documentation

## 2022-06-02 DIAGNOSIS — R293 Abnormal posture: Secondary | ICD-10-CM | POA: Diagnosis not present

## 2022-06-02 DIAGNOSIS — F17200 Nicotine dependence, unspecified, uncomplicated: Secondary | ICD-10-CM

## 2022-06-02 MED ORDER — BUPROPION HCL XL 150 MG 24 HR TABLET, EXTENDED RELEASE
150 | ORAL_TABLET | Freq: Every morning | ORAL | 2 refills | Status: SS
Start: 2022-06-02 — End: 2022-07-21

## 2022-06-02 MED ORDER — NICOTINE 21 MG/24 HR DAILY TRANSDERMAL PATCH
21 | MEDICATED_PATCH | Freq: Every day | TRANSDERMAL | 1 refills | 28.00000 days | Status: AC
Start: 2022-06-02 — End: ?

## 2022-06-02 MED ORDER — TRAZODONE HCL 50 MG PO TABS: 25.0000 mg | ORAL_TABLET | Freq: Every evening | ORAL | 3 refills | Status: DC | PRN
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
## 2022-06-02 NOTE — Telephone Encounter (Signed)
Please see pt msg and advise 

## 2022-06-03 ENCOUNTER — Ambulatory Visit (HOSPITAL_COMMUNITY)
Admission: RE | Admit: 2022-06-03 | Discharge: 2022-06-03 | Disposition: A | Discharge status: Home / Self Care | Payer: Medicare Other | Source: Ambulatory Visit | Attending: Oncology | Admitting: Oncology

## 2022-06-03 ENCOUNTER — Other Ambulatory Visit: Payer: Self-pay | Admitting: *Deleted

## 2022-06-03 ENCOUNTER — Telehealth: Payer: Self-pay | Admitting: *Deleted

## 2022-06-03 ENCOUNTER — Other Ambulatory Visit (HOSPITAL_BASED_OUTPATIENT_CLINIC_OR_DEPARTMENT_OTHER): Payer: Self-pay

## 2022-06-03 ENCOUNTER — Inpatient Hospital Stay: Payer: Medicare Other

## 2022-06-03 ENCOUNTER — Encounter: Admit: 2022-06-03 | Payer: PRIVATE HEALTH INSURANCE | Attending: Internal Medicine | Primary: Internal Medicine

## 2022-06-03 ENCOUNTER — Encounter
Admit: 2022-06-03 | Payer: PRIVATE HEALTH INSURANCE | Attending: Vascular and Interventional Radiology | Primary: Internal Medicine

## 2022-06-03 ENCOUNTER — Telehealth: Admit: 2022-06-03 | Payer: PRIVATE HEALTH INSURANCE | Attending: Internal Medicine | Primary: Internal Medicine

## 2022-06-03 DIAGNOSIS — C210 Malignant neoplasm of anus, unspecified: Secondary | ICD-10-CM | POA: Insufficient documentation

## 2022-06-03 DIAGNOSIS — Z5111 Encounter for antineoplastic chemotherapy: Secondary | ICD-10-CM | POA: Insufficient documentation

## 2022-06-03 DIAGNOSIS — Z510 Encounter for antineoplastic radiation therapy: Secondary | ICD-10-CM | POA: Diagnosis not present

## 2022-06-03 MED ORDER — PROCHLORPERAZINE MALEATE 10 MG PO TABS
10.0000 mg | ORAL_TABLET | Freq: Four times a day (QID) | ORAL | 0 refills | Status: DC | PRN
  Filled 2022-06-03: qty 30, 8d supply, fill #0

## 2022-06-03 MED ORDER — ONDANSETRON HCL 8 MG PO TABS
8.0000 mg | ORAL_TABLET | Freq: Three times a day (TID) | ORAL | 0 refills | Status: DC | PRN
  Filled 2022-06-03: qty 20, 7d supply, fill #0

## 2022-06-03 MED ORDER — HEPARIN SOD (PORK) LOCK FLUSH 100 UNIT/ML IV SOLN
INTRAVENOUS | Status: AC
  Administered 2022-06-03: 500 [IU]
  Filled 2022-06-03: qty 5

## 2022-06-03 MED ORDER — LIDOCAINE HCL 1 % IJ SOLN
INTRAMUSCULAR | Status: AC
  Administered 2022-06-03: 5 mL via INTRADERMAL
  Filled 2022-06-03: qty 20

## 2022-06-03 NOTE — Telephone Encounter (Signed)
-----   Message from Ladell Pier, MD sent at 06/02/2022  7:43 PM EST ----- Please call patient, PET confirms the hypermetabolic anal mass and no other clear evidence of metastatic disease, there is a tiny mildly hypermetabolic liver lesion-likely benign finding, needs an MRI liver with/without contrast to further evaluate this lesion

## 2022-06-03 NOTE — Telephone Encounter (Signed)
Called Alyssa Deleon with PET results and informed her Dr. Benay Spice suggests a MRI liver w/wo contrast to confirm the hypermetabolic tiny liver lesion is benign. She agrees to proceed.

## 2022-06-03 NOTE — Procedures (Signed)
Right DL basilic vein PICC placed. Length 40 cm. Tip SVC/RA junction. Medication used- 1% lidocaine to skin/SQ tissue. Ok to use. EBL < 2cc.

## 2022-06-05 ENCOUNTER — Other Ambulatory Visit: Payer: Self-pay | Admitting: Oncology

## 2022-06-05 DIAGNOSIS — C210 Malignant neoplasm of anus, unspecified: Secondary | ICD-10-CM

## 2022-06-06 ENCOUNTER — Other Ambulatory Visit: Payer: Self-pay

## 2022-06-06 ENCOUNTER — Inpatient Hospital Stay: Payer: Medicare Other | Admitting: Nurse Practitioner

## 2022-06-06 ENCOUNTER — Encounter: Payer: Self-pay | Admitting: Oncology

## 2022-06-06 ENCOUNTER — Inpatient Hospital Stay: Payer: Medicare Other

## 2022-06-06 ENCOUNTER — Encounter: Payer: Self-pay | Admitting: Nurse Practitioner

## 2022-06-06 ENCOUNTER — Ambulatory Visit
Admission: RE | Admit: 2022-06-06 | Discharge: 2022-06-06 | Disposition: A | Discharge status: Home / Self Care | Payer: Medicare Other | Source: Ambulatory Visit | Attending: Radiation Oncology | Admitting: Radiation Oncology

## 2022-06-06 ENCOUNTER — Other Ambulatory Visit (HOSPITAL_BASED_OUTPATIENT_CLINIC_OR_DEPARTMENT_OTHER): Payer: Self-pay

## 2022-06-06 ENCOUNTER — Inpatient Hospital Stay: Admit: 2022-06-06 | Discharge: 2022-06-06 | Payer: BLUE CROSS/BLUE SHIELD | Primary: Internal Medicine

## 2022-06-06 VITALS — BP 132/87 | HR 91 | Temp 98.1°F | Resp 18 | Ht 67.0 in | Wt 186.0 lb

## 2022-06-06 DIAGNOSIS — Z780 Asymptomatic menopausal state: Secondary | ICD-10-CM

## 2022-06-06 DIAGNOSIS — M810 Age-related osteoporosis without current pathological fracture: Secondary | ICD-10-CM

## 2022-06-06 DIAGNOSIS — Z13820 Encounter for screening for osteoporosis: Secondary | ICD-10-CM

## 2022-06-06 DIAGNOSIS — F17200 Nicotine dependence, unspecified, uncomplicated: Secondary | ICD-10-CM

## 2022-06-06 DIAGNOSIS — C210 Malignant neoplasm of anus, unspecified: Secondary | ICD-10-CM

## 2022-06-06 DIAGNOSIS — Z510 Encounter for antineoplastic radiation therapy: Secondary | ICD-10-CM | POA: Diagnosis not present

## 2022-06-06 LAB — RAD ONC ARIA SESSION SUMMARY
Course Elapsed Days: 0
Plan Fractions Treated to Date: 1
Plan Prescribed Dose Per Fraction: 1.8 Gy
Plan Total Fractions Prescribed: 28
Plan Total Prescribed Dose: 50.4 Gy
Reference Point Dosage Given to Date: 1.8 Gy
Reference Point Session Dosage Given: 1.8 Gy
Session Number: 1

## 2022-06-06 LAB — CBC WITH DIFFERENTIAL (CANCER CENTER ONLY)
Abs Immature Granulocytes: 0.02 10*3/uL (ref 0.00–0.07)
Basophils Absolute: 0 10*3/uL (ref 0.0–0.1)
Basophils Relative: 1 %
Eosinophils Absolute: 0.3 10*3/uL (ref 0.0–0.5)
Eosinophils Relative: 4 %
HCT: 33.9 % — ABNORMAL LOW (ref 36.0–46.0)
Hemoglobin: 11.2 g/dL — ABNORMAL LOW (ref 12.0–15.0)
Immature Granulocytes: 0 %
Lymphocytes Relative: 23 %
Lymphs Abs: 1.5 10*3/uL (ref 0.7–4.0)
MCH: 30.2 pg (ref 26.0–34.0)
MCHC: 33 g/dL (ref 30.0–36.0)
MCV: 91.4 fL (ref 80.0–100.0)
Monocytes Absolute: 0.6 10*3/uL (ref 0.1–1.0)
Monocytes Relative: 9 %
Neutro Abs: 4 10*3/uL (ref 1.7–7.7)
Neutrophils Relative %: 63 %
Platelet Count: 189 10*3/uL (ref 150–400)
RBC: 3.71 MIL/uL — ABNORMAL LOW (ref 3.87–5.11)
RDW: 12.4 % (ref 11.5–15.5)
WBC Count: 6.3 10*3/uL (ref 4.0–10.5)
nRBC: 0 % (ref 0.0–0.2)

## 2022-06-06 LAB — CMP (CANCER CENTER ONLY)
ALT: <5 U/L (ref 0–44)
AST: 9 U/L — ABNORMAL LOW (ref 15–41)
Albumin: 4.3 g/dL (ref 3.5–5.0)
Alkaline Phosphatase: 58 U/L (ref 38–126)
Anion gap: 7 (ref 5–15)
BUN: 21 mg/dL (ref 8–23)
CO2: 27 mmol/L (ref 22–32)
Calcium: 8.8 mg/dL — ABNORMAL LOW (ref 8.9–10.3)
Chloride: 104 mmol/L (ref 98–111)
Creatinine: 0.72 mg/dL (ref 0.44–1.00)
GFR, Estimated: >60 mL/min (ref >60)
Glucose, Bld: 113 mg/dL — ABNORMAL HIGH (ref 70–99)
Potassium: 4.1 mmol/L (ref 3.5–5.1)
Sodium: 138 mmol/L (ref 135–145)
Total Bilirubin: 0.5 mg/dL (ref 0.3–1.2)
Total Protein: 6.8 g/dL (ref 6.5–8.1)

## 2022-06-06 MED ORDER — MITOMYCIN CHEMO IV INJECTION 20 MG
10.0000 mg/m2 | Freq: Once | INTRAVENOUS | Status: AC
  Administered 2022-06-06: 20 mg via INTRAVENOUS
  Filled 2022-06-06: qty 40

## 2022-06-06 MED ORDER — PROCHLORPERAZINE MALEATE 10 MG PO TABS
10.0000 mg | ORAL_TABLET | Freq: Once | ORAL | Status: AC
  Administered 2022-06-06: 10 mg via ORAL
  Filled 2022-06-06: qty 1

## 2022-06-06 MED ORDER — SODIUM CHLORIDE 0.9 % IV SOLN
950.0000 mg/m2/d | INTRAVENOUS | Status: DC
  Administered 2022-06-06: 7000 mg via INTRAVENOUS
  Filled 2022-06-06: qty 100

## 2022-06-06 MED ORDER — NYSTATIN 100000 UNIT/ML MT SUSP
Freq: Two times a day (BID) | OROMUCOSAL | 1 refills | Status: DC
  Filled 2022-06-06: qty 240, 12d supply, fill #0

## 2022-06-06 MED ORDER — SODIUM CHLORIDE 0.9% FLUSH
10.0000 mL | INTRAVENOUS | Status: DC | PRN
  Administered 2022-06-06: 10 mL via INTRAVENOUS

## 2022-06-06 MED ORDER — SODIUM CHLORIDE 0.9 % IV SOLN: Freq: Once | INTRAVENOUS | Status: AC

## 2022-06-06 NOTE — Progress Notes (Signed)
Pharmacist Chemotherapy Monitoring - Initial Assessment    Anticipated start date: 06/06/22   The following has been reviewed per standard work regarding the patient's treatment regimen: The patient's diagnosis, treatment plan and drug doses, and organ/hematologic function Lab orders and baseline tests specific to treatment regimen  The treatment plan start date, drug sequencing, and pre-medications Prior authorization status  Patient's documented medication list, including drug-drug interaction screen and prescriptions for anti-emetics and supportive care specific to the treatment regimen The drug concentrations, fluid compatibility, administration routes, and timing of the medications to be used The patient's access for treatment and lifetime cumulative dose history, if applicable  The patient's medication allergies and previous infusion related reactions, if applicable   Changes made to treatment plan:  N/A  Follow up needed:  N/A   Patrica Duel, RPH, 06/06/2022  10:17 AM

## 2022-06-06 NOTE — Progress Notes (Signed)
Patient seen by Dr. Sherrill today ° °Vitals are within treatment parameters. ° °Labs reviewed by Lisa Thomas NP and are within treatment parameters. ° °Per physician team, patient is ready for treatment and there are NO modifications to the treatment plan.  °

## 2022-06-06 NOTE — Progress Notes (Signed)
  [place] OFFICE PROGRESS NOTE   Diagnosis: Anal cancer  INTERVAL HISTORY:   Alyssa Deleon returns as scheduled.  Bowels are moving, stool is solid.  She notes a decrease in bleeding over the past few days.  Some mucousy rectal discharge.  She has a single mouth sore.  Objective:  Vital signs in last 24 hours:  Blood pressure 132/87, pulse 91, temperature 98.1 F (36.7 C), temperature source Oral, resp. rate 18, height 5\' 7"  (1.702 m), weight 186 lb (84.4 kg), SpO2 98 %.    HEENT: Area of mild erythema right upper anterior gumline. Resp: Lungs clear bilaterally. Cardio: Regular rate and rhythm. GI: Abdomen soft and nontender.  No hepatosplenomegaly.  No mass. Vascular: No leg edema. Neuro: Alert and oriented. Right upper extremity PICC site without erythema.  Lab Results:  Lab Results  Component Value Date   WBC 6.3 06/06/2022   HGB 11.2 (L) 06/06/2022   HCT 33.9 (L) 06/06/2022   MCV 91.4 06/06/2022   PLT 189 06/06/2022   NEUTROABS 4.0 06/06/2022    Imaging:  No results found.  Medications: I have reviewed the patient's current medications.  Assessment/Plan: Anal cancer Anal mass noted on digital exam and colonoscopy 05/09/2022-biopsy consistent with squamous cell carcinoma CTs 05/12/2022-no evidence of thoracic metastatic disease, calcified right pleural plaques, asymmetric thickening of the right posterior wall of the distal rectum/anal canal, no evidence of metastatic adenopathy in the abdomen or pelvis, 2 small hypodense left liver lesions favored to represent benign cysts PET scan [id number]/11/[id number] hypermetabolic activity at the anus.  No evidence of metastatic adenopathy.  Indeterminate small, mildly hypermetabolic lesion in the lateral segment of the left hepatic lobe. Radiation 06/06/2022  Cycle one 5-FU/Mitomycin-C 06/06/2022 Depression Left breast cancer 1995 Recurrent left breast cancer 2003?  Status post a left mastectomy and TRAM Family  history of multiple cancers she reports being negative for a BRCA mutation    Disposition: Alyssa Deleon appears stable.  She began radiation earlier today.  She is scheduled to begin treatment today with cycle one 5-FU/Mitomycin-C.  We again reviewed potential toxicities.  She agrees to proceed.  CBC and chemistry panel reviewed.  Labs adequate to proceed as above.  We discussed the PET scan results which show a small mildly hypermetabolic lesion in the liver.  She is scheduled for MRI of the liver 06/09/2022.  She will return for lab and follow-up 06/17/2022.  We are available to see her sooner if needed.    Ned Card ANP/GNP-BC   06/06/2022  9:17 AM

## 2022-06-06 NOTE — Patient Instructions (Addendum)
[facility]  Discharge Instructions: Thank you for choosing [facility] to provide your oncology and hematology care.   If you have a lab appointment with the [facility], please go directly to the [facility] and check in at the registration area.   Wear comfortable clothing and clothing appropriate for easy access to any Portacath or PICC line.   We strive to give you quality time with your provider. You may need to reschedule your appointment if you arrive late (15 or more minutes).  Arriving late affects you and other patients whose appointments are after yours.  Also, if you miss three or more appointments without notifying the office, you may be dismissed from the clinic at the provider's discretion.      For prescription refill requests, have your pharmacy contact our office and allow 72 hours for refills to be completed.    Today you received the following chemotherapy and/or immunotherapy agents Mitomycin and 5FU      To help prevent nausea and vomiting after your treatment, we encourage you to take your nausea medication as directed.  BELOW ARE SYMPTOMS THAT SHOULD BE REPORTED IMMEDIATELY: *FEVER GREATER THAN 100.4 F (38 C) OR HIGHER *CHILLS OR SWEATING *NAUSEA AND VOMITING THAT IS NOT CONTROLLED WITH YOUR NAUSEA MEDICATION *UNUSUAL SHORTNESS OF BREATH *UNUSUAL BRUISING OR BLEEDING *URINARY PROBLEMS (pain or burning when urinating, or frequent urination) *BOWEL PROBLEMS (unusual diarrhea, constipation, pain near the anus) TENDERNESS IN MOUTH AND THROAT WITH OR WITHOUT PRESENCE OF ULCERS (sore throat, sores in mouth, or a toothache) UNUSUAL RASH, SWELLING OR PAIN  UNUSUAL VAGINAL DISCHARGE OR ITCHING   Items with * indicate a potential emergency and should be followed up as soon as possible or go to the Emergency Department if any problems should occur.  Please show the CHEMOTHERAPY ALERT CARD or IMMUNOTHERAPY ALERT CARD at  check-in to the Emergency Department and triage nurse.  Should you have questions after your visit or need to cancel or reschedule your appointment, please contact [facility]  Dept: [phone]  and follow the prompts.  Office hours are 8:00 a.m. to 4:30 p.m. Monday - Friday. Please note that voicemails left after 4:00 p.m. may not be returned until the following business day.  We are closed weekends and major holidays. You have access to a nurse at all times for urgent questions. Please call the main number to the clinic Dept: [phone] and follow the prompts.   For any non-urgent questions, you may also contact your provider using MyChart. We now offer e-Visits for anyone 38 and older to request care online for non-urgent symptoms. For details visit [email or web address].   Also download the MyChart app! Go to the app store, search "MyChart", open the app, select [facility], and log in with your MyChart username and password.  Mitomycin Injection What is this medication? MITOMYCIN (mye toe MYE sin) treats stomach cancer and pancreatic cancer. It works by slowing down the growth of cancer cells. This medicine may be used for other purposes; ask your health care provider or pharmacist if you have questions. COMMON BRAND NAME(S): Mutamycin What should I tell my care team before I take this medication? They need to know if you have any of these conditions: Bleeding disorders Infection, such as chickenpox, cold sores, herpes Low blood counts, such as low white cells, platelets, red blood cells Kidney disease An unusual or allergic reaction to mitomycin, other medications, foods,  dyes, or preservatives Pregnant or trying to get pregnant Breastfeeding How should I use this medication? This medication is injected into a vein. It is given by your care team in a hospital or clinic setting. Talk to your care team about the use of this medication in children.  Special care may be needed. Overdosage: If you think you have taken too much of this medicine contact a poison control center or emergency room at once. NOTE: This medicine is only for you. Do not share this medicine with others. What if I miss a dose? Keep appointments for follow-up doses. It is important not to miss your dose. Call your care team if you are unable to keep an appointment. What may interact with this medication? Interactions are not expected. This list may not describe all possible interactions. Give your health care provider a list of all the medicines, herbs, non-prescription drugs, or dietary supplements you use. Also tell them if you smoke, drink alcohol, or use illegal drugs. Some items may interact with your medicine. What should I watch for while using this medication? Your condition will be monitored carefully while you are receiving this medication. You may need blood work while taking this medication. This medication may make you feel generally unwell. This is not uncommon as chemotherapy can affect healthy cells as well as cancer cells. Report any side effects. Continue your course of treatment even though you feel ill unless your care team tells you to stop. This medication may increase your risk of getting an infection. Call your care team for advice if you get a fever, chills, sore throat, or other symptoms of a cold or flu. Do not treat yourself. Try to avoid being around people who are sick. Avoid taking medications that contain aspirin, acetaminophen, ibuprofen, naproxen, or ketoprofen unless instructed by your care team. These medications may hide a fever. This medication may increase your risk to bruise or bleed. Call your care team if you notice any unusual bleeding. Be careful brushing or flossing your teeth or using a toothpick because you may get an infection or bleed more easily. If you have any dental work done, tell your dentist you are receiving this  medication. Talk to your care team if you may be pregnant. Serious birth defects can occur if you take this medication during pregnancy. Contraception is recommended while taking this medication. Your care team can help you find the option that works for you. Do not breastfeed while taking this medication. What side effects may I notice from receiving this medication? Side effects that you should report to your care team as soon as possible: Allergic reactions--skin rash, itching, hives, swelling of the face, lips, tongue, or throat Dry cough, shortness of breath or trouble breathing Infection--fever, chills, cough, sore throat, wounds that don't heal, pain or trouble when passing urine, general feeling of discomfort or being unwell Kidney injury--decrease in the amount of urine, swelling of the ankles, hands, or feet Low red blood cell level--unusual weakness or fatigue, dizziness, headache, trouble breathing Stomach pain, bloody diarrhea, pale skin, unusual weakness or fatigue, decrease in the amount of urine, which may be signs of hemolytic uremic syndrome Unusual bruising or bleeding Side effects that usually do not require medical attention (report these to your care team if they continue or are bothersome): Diarrhea Hair loss Loss of appetite with weight loss Nausea Pain, redness, or swelling with sores inside the mouth or throat This list may not describe all possible side effects. Call  your doctor for medical advice about side effects. You may report side effects to FDA at 1-800-FDA-1088. Where should I keep my medication? This medication is given in a hospital or clinic. It will not be stored at home. NOTE: This sheet is a summary. It may not cover all possible information. If you have questions about this medicine, talk to your doctor, pharmacist, or health care provider.  2023 Elsevier/Gold Standard (2021-08-04 00:00:00)  Fluorouracil Injection What is this  medication? FLUOROURACIL (flure oh YOOR a sil) treats some types of cancer. It works by slowing down the growth of cancer cells. This medicine may be used for other purposes; ask your health care provider or pharmacist if you have questions. COMMON BRAND NAME(S): Adrucil What should I tell my care team before I take this medication? They need to know if you have any of these conditions: Blood disorders Dihydropyrimidine dehydrogenase (DPD) deficiency Infection, such as chickenpox, cold sores, herpes Kidney disease Liver disease Poor nutrition Recent or ongoing radiation therapy An unusual or allergic reaction to fluorouracil, other medications, foods, dyes, or preservatives If you or your partner are pregnant or trying to get pregnant Breast-feeding How should I use this medication? This medication is injected into a vein. It is administered by your care team in a hospital or clinic setting. Talk to your care team about the use of this medication in children. Special care may be needed. Overdosage: If you think you have taken too much of this medicine contact a poison control center or emergency room at once. NOTE: This medicine is only for you. Do not share this medicine with others. What if I miss a dose? Keep appointments for follow-up doses. It is important not to miss your dose. Call your care team if you are unable to keep an appointment. What may interact with this medication? Do not take this medication with any of the following: Live virus vaccines This medication may also interact with the following: Medications that treat or prevent blood clots, such as warfarin, enoxaparin, dalteparin This list may not describe all possible interactions. Give your health care provider a list of all the medicines, herbs, non-prescription drugs, or dietary supplements you use. Also tell them if you smoke, drink alcohol, or use illegal drugs. Some items may interact with your medicine. What  should I watch for while using this medication? Your condition will be monitored carefully while you are receiving this medication. This medication may make you feel generally unwell. This is not uncommon as chemotherapy can affect healthy cells as well as cancer cells. Report any side effects. Continue your course of treatment even though you feel ill unless your care team tells you to stop. In some cases, you may be given additional medications to help with side effects. Follow all directions for their use. This medication may increase your risk of getting an infection. Call your care team for advice if you get a fever, chills, sore throat, or other symptoms of a cold or flu. Do not treat yourself. Try to avoid being around people who are sick. This medication may increase your risk to bruise or bleed. Call your care team if you notice any unusual bleeding. Be careful brushing or flossing your teeth or using a toothpick because you may get an infection or bleed more easily. If you have any dental work done, tell your dentist you are receiving this medication. Avoid taking medications that contain aspirin, acetaminophen, ibuprofen, naproxen, or ketoprofen unless instructed by your care team.  These medications may hide a fever. Do not treat diarrhea with over the counter products. Contact your care team if you have diarrhea that lasts more than 2 days or if it is severe and watery. This medication can make you more sensitive to the sun. Keep out of the sun. If you cannot avoid being in the sun, wear protective clothing and sunscreen. Do not use sun lamps, tanning beds, or tanning booths. Talk to your care team if you or your partner wish to become pregnant or think you might be pregnant. This medication can cause serious birth defects if taken during pregnancy and for 3 months after the last dose. A reliable form of contraception is recommended while taking this medication and for 3 months after the last  dose. Talk to your care team about effective forms of contraception. Do not father a child while taking this medication and for 3 months after the last dose. Use a condom while having sex during this time period. Do not breastfeed while taking this medication. This medication may cause infertility. Talk to your care team if you are concerned about your fertility. What side effects may I notice from receiving this medication? Side effects that you should report to your care team as soon as possible: Allergic reactions--skin rash, itching, hives, swelling of the face, lips, tongue, or throat Heart attack--pain or tightness in the chest, shoulders, arms, or jaw, nausea, shortness of breath, cold or clammy skin, feeling faint or lightheaded Heart failure--shortness of breath, swelling of the ankles, feet, or hands, sudden weight gain, unusual weakness or fatigue Heart rhythm changes--fast or irregular heartbeat, dizziness, feeling faint or lightheaded, chest pain, trouble breathing High ammonia level--unusual weakness or fatigue, confusion, loss of appetite, nausea, vomiting, seizures Infection--fever, chills, cough, sore throat, wounds that don't heal, pain or trouble when passing urine, general feeling of discomfort or being unwell Low red blood cell level--unusual weakness or fatigue, dizziness, headache, trouble breathing Pain, tingling, or numbness in the hands or feet, muscle weakness, change in vision, confusion or trouble speaking, loss of balance or coordination, trouble walking, seizures Redness, swelling, and blistering of the skin over hands and feet Severe or prolonged diarrhea Unusual bruising or bleeding Side effects that usually do not require medical attention (report to your care team if they continue or are bothersome): Dry skin Headache Increased tears Nausea Pain, redness, or swelling with sores inside the mouth or throat Sensitivity to light Vomiting This list may not  describe all possible side effects. Call your doctor for medical advice about side effects. You may report side effects to FDA at 1-800-FDA-1088. Where should I keep my medication? This medication is given in a hospital or clinic. It will not be stored at home. NOTE: This sheet is a summary. It may not cover all possible information. If you have questions about this medicine, talk to your doctor, pharmacist, or health care provider.  2023 Elsevier/Gold Standard (2021-07-13 00:00:00)

## 2022-06-07 ENCOUNTER — Other Ambulatory Visit: Payer: Self-pay

## 2022-06-07 ENCOUNTER — Ambulatory Visit
Admission: RE | Admit: 2022-06-07 | Discharge: 2022-06-07 | Disposition: A | Discharge status: Home / Self Care | Payer: Medicare Other | Source: Ambulatory Visit | Attending: Radiation Oncology | Admitting: Radiation Oncology

## 2022-06-07 ENCOUNTER — Telehealth: Payer: Self-pay

## 2022-06-07 DIAGNOSIS — Z510 Encounter for antineoplastic radiation therapy: Secondary | ICD-10-CM | POA: Diagnosis not present

## 2022-06-07 LAB — RAD ONC ARIA SESSION SUMMARY
Course Elapsed Days: 1
Plan Fractions Treated to Date: 2
Plan Prescribed Dose Per Fraction: 1.8 Gy
Plan Total Fractions Prescribed: 28
Plan Total Prescribed Dose: 50.4 Gy
Reference Point Dosage Given to Date: 3.6 Gy
Reference Point Session Dosage Given: 1.8 Gy
Session Number: 2

## 2022-06-07 NOTE — Telephone Encounter (Signed)
Alyssa Deleon  Telephone call to patient post her first time Mitomycin/5FU pump treatment. Patient reports some fatigue and concern about dropping her pump. She denies any other concerns and stated that she can't wait to get the pump disconnected on Friday. She knows to call the clinic with any new concerns or questions.

## 2022-06-08 ENCOUNTER — Other Ambulatory Visit: Payer: Self-pay

## 2022-06-08 ENCOUNTER — Ambulatory Visit
Admission: RE | Admit: 2022-06-08 | Discharge: 2022-06-08 | Disposition: A | Discharge status: Home / Self Care | Payer: Medicare Other | Source: Ambulatory Visit | Attending: Radiation Oncology | Admitting: Radiation Oncology

## 2022-06-08 ENCOUNTER — Telehealth: Admit: 2022-06-08 | Payer: PRIVATE HEALTH INSURANCE | Attending: Internal Medicine | Primary: Internal Medicine

## 2022-06-08 DIAGNOSIS — Z510 Encounter for antineoplastic radiation therapy: Secondary | ICD-10-CM | POA: Diagnosis not present

## 2022-06-08 LAB — RAD ONC ARIA SESSION SUMMARY
Course Elapsed Days: 2
Plan Fractions Treated to Date: 3
Plan Prescribed Dose Per Fraction: 1.8 Gy
Plan Total Fractions Prescribed: 28
Plan Total Prescribed Dose: 50.4 Gy
Reference Point Dosage Given to Date: 5.4 Gy
Reference Point Session Dosage Given: 1.8 Gy
Session Number: 3

## 2022-06-09 ENCOUNTER — Ambulatory Visit: Payer: Medicare Other | Admitting: Physician Assistant

## 2022-06-09 ENCOUNTER — Other Ambulatory Visit: Payer: Self-pay

## 2022-06-09 ENCOUNTER — Encounter (HOSPITAL_COMMUNITY): Payer: Self-pay

## 2022-06-09 ENCOUNTER — Ambulatory Visit
Admission: RE | Admit: 2022-06-09 | Discharge: 2022-06-09 | Disposition: A | Discharge status: Home / Self Care | Payer: Medicare Other | Source: Ambulatory Visit | Attending: Radiation Oncology | Admitting: Radiation Oncology

## 2022-06-09 ENCOUNTER — Ambulatory Visit (HOSPITAL_COMMUNITY)
Admission: RE | Admit: 2022-06-09 | Discharge: 2022-06-09 | Disposition: A | Discharge status: Home / Self Care | Payer: Medicare Other | Source: Ambulatory Visit | Attending: Oncology | Admitting: Oncology

## 2022-06-09 DIAGNOSIS — Z510 Encounter for antineoplastic radiation therapy: Secondary | ICD-10-CM | POA: Diagnosis not present

## 2022-06-09 DIAGNOSIS — C210 Malignant neoplasm of anus, unspecified: Secondary | ICD-10-CM

## 2022-06-09 LAB — RAD ONC ARIA SESSION SUMMARY
Course Elapsed Days: 3
Plan Fractions Treated to Date: 4
Plan Prescribed Dose Per Fraction: 1.8 Gy
Plan Total Fractions Prescribed: 28
Plan Total Prescribed Dose: 50.4 Gy
Reference Point Dosage Given to Date: 7.2 Gy
Reference Point Session Dosage Given: 1.8 Gy
Session Number: 4

## 2022-06-10 ENCOUNTER — Inpatient Hospital Stay: Payer: Medicare Other

## 2022-06-10 ENCOUNTER — Ambulatory Visit
Admission: RE | Admit: 2022-06-10 | Discharge: 2022-06-10 | Disposition: A | Discharge status: Home / Self Care | Payer: Medicare Other | Source: Ambulatory Visit | Attending: Radiation Oncology | Admitting: Radiation Oncology

## 2022-06-10 ENCOUNTER — Other Ambulatory Visit: Payer: Self-pay

## 2022-06-10 ENCOUNTER — Encounter
Admit: 2022-06-10 | Payer: PRIVATE HEALTH INSURANCE | Attending: Vascular and Interventional Radiology | Primary: Internal Medicine

## 2022-06-10 VITALS — BP 111/76 | HR 93 | Temp 98.1°F | Resp 18

## 2022-06-10 DIAGNOSIS — C210 Malignant neoplasm of anus, unspecified: Secondary | ICD-10-CM

## 2022-06-10 DIAGNOSIS — Z510 Encounter for antineoplastic radiation therapy: Secondary | ICD-10-CM | POA: Diagnosis not present

## 2022-06-10 LAB — RAD ONC ARIA SESSION SUMMARY
Course Elapsed Days: 4
Plan Fractions Treated to Date: 5
Plan Prescribed Dose Per Fraction: 1.8 Gy
Plan Total Fractions Prescribed: 28
Plan Total Prescribed Dose: 50.4 Gy
Reference Point Dosage Given to Date: 9 Gy
Reference Point Session Dosage Given: 1.8 Gy
Session Number: 5

## 2022-06-10 MED ORDER — SODIUM CHLORIDE 0.9% FLUSH
10.0000 mL | INTRAVENOUS | Status: DC | PRN
  Administered 2022-06-10: 10 mL

## 2022-06-10 MED ORDER — SONAFINE EX EMUL
1.0000 | Freq: Once | CUTANEOUS | Status: AC
  Administered 2022-06-10: 1 via TOPICAL

## 2022-06-10 MED ORDER — HEPARIN SOD (PORK) LOCK FLUSH 100 UNIT/ML IV SOLN
500.0000 [IU] | Freq: Once | INTRAVENOUS | Status: AC | PRN
  Administered 2022-06-10: 250 [IU]

## 2022-06-10 NOTE — Progress Notes (Signed)
PICC Removal Note: S: Patient completed chemotherapy treatment. Order to remove PICC. O: PICC line removed from right antecubital after sterile site prepped per protocol. PICC catheter tip visualized and intact at 40 cm as documented Pressure dressing applied with Medipore tape. A: No redness, ecchymosis, edema, swelling, or drainage noted at site. P: Instructions provided on post PICC discharge care, including followup notification instructions.  Patient stable at discharge.

## 2022-06-10 NOTE — Patient Instructions (Signed)
PICC Removal, Adult, Care After The following information offers guidance on how to care for yourself after your procedure. Your health care provider may also give you more specific instructions. If you have problems or questions, contact your health care provider. What can I expect after the procedure? After the procedure, it is common to have: Tenderness or soreness. Redness, swelling, or a scab at the place where your PICC was removed (exit site). Follow these instructions at home: For the first 24 hours after the procedure: Keep the bandage (dressing) on your exit site clean and dry. Do not remove your dressing until your health care provider tells you to do so. Do not lift anything heavy or do activities that require great effort until your health care provider says it is okay. You should avoid: Lifting weights. Doing yard work. Doing any physical activity with repetitive arm movement. Watch closely for any signs of an air bubble in the vein (air embolism). This is a rare but serious complication. Signs of an air embolism include trouble breathing, wheezing, chest pain, or a fast pulse. If you have signs of an air embolism, call 911 right away and lie down on your left side to keep the air from moving into your lungs. After 24 hours have passed:  Remove your dressing as told by your health care provider. Wash your hands with soap and water for at least 20 seconds before and after you change your dressing. If soap and water are not available, use hand sanitizer. Return to your normal activities as told by your health care provider. A small scab may develop over the exit site. Do not pick at the scab. When bathing or showering, gently wash the exit site with soap and water. Pat it dry. Watch for signs of infection, such as: A fever or chills. Swollen glands under your arm. More redness, swelling, or soreness around your arm. Blood, fluid, or pus coming from your exit site. Warmth or a  bad smell coming from your exit site. A red streak spreading away from your exit site. General instructions Take over-the-counter and prescription medicines only as told by your health care provider. Do not take any new medicines without checking with your health care provider first. If you were given an antibiotic ointment, apply it as told by your health care provider. Keep all follow-up visits. This is important. Contact a health care provider if: You have a fever or chills. You have swelling at your exit site or swollen glands under your arm. You have signs of infection at your exit site. You have soreness, redness, or swelling in your arm that gets worse. Get help right away if: You have numbness or tingling in your fingers, hand, or arm. Your arm looks blue and feels cold. You have signs of an air embolism, such as trouble breathing, wheezing, chest pain, or a fast pulse. These symptoms may be an emergency. Get medical help right away. Call 911. Do not wait to see if the symptoms will go away. Do not drive yourself to the hospital. Summary After a PICC is removed, it is common to have tenderness or soreness, redness, swelling, or a scab at the exit site. Keep the bandage (dressing) over the exit site clean and dry. Do not remove the dressing until your health care provider tells you to do so. Do not lift anything heavy or do activities that require great effort until your health care provider says it is okay. Watch closely for any signs   of an air bubble (air embolism). If you have signs of an air embolism, call 911 right away and lie down on your left side. This information is not intended to replace advice given to you by your health care provider. Make sure you discuss any questions you have with your health care provider. Document Revised: 09/30/2020 Document Reviewed: 09/30/2020 Elsevier Patient Education  2023 Elsevier Inc.  

## 2022-06-10 NOTE — Progress Notes (Signed)
Pt here for patient teaching. Pt given Radiation and You booklet, skin care instructions, and Sonafine. Reviewed areas of pertinence such as diarrhea, fatigue, hair loss, nausea and vomiting, sexual and fertility changes, skin changes, urinary and bladder changes, headache, and taste changes. Pt able to give teach back of to pat skin, use unscented/gentle soap, use baby wipes, have Imodium on hand, drink plenty of water, and sitz bath, apply Sonafine bid, avoid applying anything to skin within 4 hours of treatment, and to use an electric razor if they must shave. Pt verbalizes understanding of information given and will contact nursing with any questions or concerns.     [email or web address]

## 2022-06-11 ENCOUNTER — Ambulatory Visit (HOSPITAL_COMMUNITY): Payer: Medicare Other

## 2022-06-11 ENCOUNTER — Encounter (HOSPITAL_COMMUNITY): Payer: Self-pay

## 2022-06-11 ENCOUNTER — Encounter
Admit: 2022-06-11 | Payer: PRIVATE HEALTH INSURANCE | Attending: Vascular and Interventional Radiology | Primary: Internal Medicine

## 2022-06-13 ENCOUNTER — Telehealth: Payer: Self-pay

## 2022-06-13 ENCOUNTER — Other Ambulatory Visit: Payer: Self-pay

## 2022-06-13 ENCOUNTER — Ambulatory Visit
Admission: RE | Admit: 2022-06-13 | Discharge: 2022-06-13 | Disposition: A | Discharge status: Home / Self Care | Payer: Medicare Other | Source: Ambulatory Visit | Attending: Radiation Oncology | Admitting: Radiation Oncology

## 2022-06-13 DIAGNOSIS — Z510 Encounter for antineoplastic radiation therapy: Secondary | ICD-10-CM | POA: Diagnosis not present

## 2022-06-13 LAB — RAD ONC ARIA SESSION SUMMARY
Course Elapsed Days: 7
Plan Fractions Treated to Date: 6
Plan Prescribed Dose Per Fraction: 1.8 Gy
Plan Total Fractions Prescribed: 28
Plan Total Prescribed Dose: 50.4 Gy
Reference Point Dosage Given to Date: 10.8 Gy
Reference Point Session Dosage Given: 1.8 Gy
Session Number: 6

## 2022-06-13 NOTE — Transitions of Care (Post Inpatient/ED Visit) (Signed)
   06/13/2022  Name: Alyssa Deleon MRN: [id number] DOB: 1955-05-19  Today's TOC FU Call Status: Today's TOC FU Call Status:: Successful TOC FU Call Competed TOC FU Call Complete Date: 06/13/22  Transition Care Management Follow-up Telephone Call Date of Discharge: 06/09/22 Discharge Facility: [facility]) Type of Discharge: Inpatient Admission Primary Inpatient Discharge Diagnosis:: anal cancer How have you been since you were released from the hospital?: Better Any questions or concerns?: No  Items Reviewed: Did you receive and understand the discharge instructions provided?: Yes Medications obtained and verified?: Yes (Medications Reviewed) Any new allergies since your discharge?: No Dietary orders reviewed?: Yes Do you have support at home?: Yes  Home Care and Equipment/Supplies: [place] Ordered?: No Any new equipment or medical supplies ordered?: No  Functional Questionnaire: Do you need assistance with bathing/showering or dressing?: No Do you need assistance with meal preparation?: No Do you need assistance with eating?: No Do you have difficulty maintaining continence: No Do you need assistance with getting out of bed/getting out of a chair/moving?: No Do you have difficulty managing or taking your medications?: No  Folllow up appointments reviewed: PCP Follow-up appointment confirmed?: No (specialist) MD Provider Line Number:[phone] Given: Yes [facility] Follow-up appointment confirmed?: Yes Follow-Up Specialty Provider:: oncologist Do you need transportation to your follow-up appointment?: No Do you understand care options if your condition(s) worsen?: Yes-patient verbalized understanding    [place] LPN [facility] Direct Dial [phone]

## 2022-06-14 ENCOUNTER — Telehealth: Payer: Self-pay

## 2022-06-14 ENCOUNTER — Inpatient Hospital Stay: Payer: Medicare Other | Admitting: Dietician

## 2022-06-14 ENCOUNTER — Ambulatory Visit: Payer: Medicare Other

## 2022-06-14 ENCOUNTER — Encounter: Admit: 2022-06-14 | Payer: PRIVATE HEALTH INSURANCE | Attending: Internal Medicine | Primary: Internal Medicine

## 2022-06-14 ENCOUNTER — Encounter: Admit: 2022-06-14 | Payer: BLUE CROSS/BLUE SHIELD | Attending: Internal Medicine | Primary: Internal Medicine

## 2022-06-14 ENCOUNTER — Telehealth: Admit: 2022-06-14 | Payer: PRIVATE HEALTH INSURANCE | Attending: Internal Medicine | Primary: Internal Medicine

## 2022-06-14 MED ORDER — ALENDRONATE 70 MG TABLET
70 | ORAL_TABLET | ORAL | 5 refills | Status: SS
Start: 2022-06-14 — End: 2022-07-21

## 2022-06-14 NOTE — Telephone Encounter (Signed)
Mrs. Alyssa Deleon called in and report she had diarrhea on Saturday and Sunday. Sunday was the worst, she went about 20 times. The stool was liquid and  had mucus. Patient take Loperamide 2 mg capsule and that seem to help.  Monday and Tuesday no diarrhea. Patient is encourage still hydrated and to call the center if she has diarrhea again.  The next office visit she wants to discuss changing the loperamide 2 mg capsule to tab.

## 2022-06-15 ENCOUNTER — Ambulatory Visit
Admission: RE | Admit: 2022-06-15 | Discharge: 2022-06-15 | Disposition: A | Discharge status: Home / Self Care | Payer: Medicare Other | Source: Ambulatory Visit | Attending: Radiation Oncology | Admitting: Radiation Oncology

## 2022-06-15 ENCOUNTER — Other Ambulatory Visit: Payer: Self-pay

## 2022-06-15 ENCOUNTER — Ambulatory Visit (HOSPITAL_COMMUNITY)
Admission: RE | Admit: 2022-06-15 | Discharge: 2022-06-15 | Disposition: A | Discharge status: Home / Self Care | Payer: Medicare Other | Source: Ambulatory Visit | Attending: Oncology | Admitting: Oncology

## 2022-06-15 DIAGNOSIS — C210 Malignant neoplasm of anus, unspecified: Secondary | ICD-10-CM | POA: Diagnosis present

## 2022-06-15 LAB — RAD ONC ARIA SESSION SUMMARY
Course Elapsed Days: 9
Plan Fractions Treated to Date: 7
Plan Prescribed Dose Per Fraction: 1.8 Gy
Plan Total Fractions Prescribed: 28
Plan Total Prescribed Dose: 50.4 Gy
Reference Point Dosage Given to Date: 12.6 Gy
Reference Point Session Dosage Given: 1.8 Gy
Session Number: 7

## 2022-06-15 MED ORDER — GADOBUTROL 1 MMOL/ML IV SOLN
8.4000 mL | Freq: Once | INTRAVENOUS | Status: AC | PRN
  Administered 2022-06-15: 8.4 mL via INTRAVENOUS

## 2022-06-16 ENCOUNTER — Ambulatory Visit
Admission: RE | Admit: 2022-06-16 | Discharge: 2022-06-16 | Disposition: A | Discharge status: Home / Self Care | Payer: Medicare Other | Source: Ambulatory Visit | Attending: Radiation Oncology | Admitting: Radiation Oncology

## 2022-06-16 ENCOUNTER — Other Ambulatory Visit: Payer: Self-pay

## 2022-06-16 DIAGNOSIS — Z510 Encounter for antineoplastic radiation therapy: Secondary | ICD-10-CM | POA: Diagnosis not present

## 2022-06-16 LAB — RAD ONC ARIA SESSION SUMMARY
Course Elapsed Days: 10
Plan Fractions Treated to Date: 8
Plan Prescribed Dose Per Fraction: 1.8 Gy
Plan Total Fractions Prescribed: 28
Plan Total Prescribed Dose: 50.4 Gy
Reference Point Dosage Given to Date: 14.4 Gy
Reference Point Session Dosage Given: 1.8 Gy
Session Number: 8

## 2022-06-17 ENCOUNTER — Inpatient Hospital Stay: Payer: Medicare Other

## 2022-06-17 ENCOUNTER — Telehealth: Payer: Self-pay

## 2022-06-17 ENCOUNTER — Ambulatory Visit
Admission: RE | Admit: 2022-06-17 | Discharge: 2022-06-17 | Disposition: A | Discharge status: Home / Self Care | Payer: Medicare Other | Source: Ambulatory Visit | Attending: Radiation Oncology | Admitting: Radiation Oncology

## 2022-06-17 ENCOUNTER — Inpatient Hospital Stay: Payer: Medicare Other | Admitting: Nurse Practitioner

## 2022-06-17 ENCOUNTER — Encounter: Payer: Self-pay | Admitting: Nurse Practitioner

## 2022-06-17 ENCOUNTER — Other Ambulatory Visit: Payer: Self-pay

## 2022-06-17 VITALS — BP 118/62 | HR 93 | Temp 98.1°F | Resp 18 | Ht 67.0 in | Wt 182.0 lb

## 2022-06-17 DIAGNOSIS — Z510 Encounter for antineoplastic radiation therapy: Secondary | ICD-10-CM | POA: Diagnosis not present

## 2022-06-17 DIAGNOSIS — C210 Malignant neoplasm of anus, unspecified: Secondary | ICD-10-CM

## 2022-06-17 LAB — RAD ONC ARIA SESSION SUMMARY
Course Elapsed Days: 11
Plan Fractions Treated to Date: 9
Plan Prescribed Dose Per Fraction: 1.8 Gy
Plan Total Fractions Prescribed: 28
Plan Total Prescribed Dose: 50.4 Gy
Reference Point Dosage Given to Date: 16.2 Gy
Reference Point Session Dosage Given: 1.8 Gy
Session Number: 9

## 2022-06-17 LAB — CBC WITH DIFFERENTIAL (CANCER CENTER ONLY)
Abs Immature Granulocytes: 0 10*3/uL (ref 0.00–0.07)
Basophils Absolute: 0 10*3/uL (ref 0.0–0.1)
Basophils Relative: 0 %
Eosinophils Absolute: 0.2 10*3/uL (ref 0.0–0.5)
Eosinophils Relative: 7 %
HCT: 36.6 % (ref 36.0–46.0)
Hemoglobin: 11.9 g/dL — ABNORMAL LOW (ref 12.0–15.0)
Immature Granulocytes: 0 %
Lymphocytes Relative: 27 %
Lymphs Abs: 0.6 10*3/uL — ABNORMAL LOW (ref 0.7–4.0)
MCH: 29.5 pg (ref 26.0–34.0)
MCHC: 32.5 g/dL (ref 30.0–36.0)
MCV: 90.8 fL (ref 80.0–100.0)
Monocytes Absolute: 0.3 10*3/uL (ref 0.1–1.0)
Monocytes Relative: 13 %
Neutro Abs: 1.2 10*3/uL — ABNORMAL LOW (ref 1.7–7.7)
Neutrophils Relative %: 53 %
Platelet Count: 211 10*3/uL (ref 150–400)
RBC: 4.03 MIL/uL (ref 3.87–5.11)
RDW: 12.3 % (ref 11.5–15.5)
WBC Count: 2.3 10*3/uL — ABNORMAL LOW (ref 4.0–10.5)
nRBC: 0 % (ref 0.0–0.2)

## 2022-06-17 LAB — CMP (CANCER CENTER ONLY)
ALT: 6 U/L (ref 0–44)
AST: 11 U/L — ABNORMAL LOW (ref 15–41)
Albumin: 4.2 g/dL (ref 3.5–5.0)
Alkaline Phosphatase: 50 U/L (ref 38–126)
Anion gap: 6 (ref 5–15)
BUN: 12 mg/dL (ref 8–23)
CO2: 29 mmol/L (ref 22–32)
Calcium: 9.3 mg/dL (ref 8.9–10.3)
Chloride: 108 mmol/L (ref 98–111)
Creatinine: 0.78 mg/dL (ref 0.44–1.00)
GFR, Estimated: >60 mL/min (ref >60)
Glucose, Bld: 106 mg/dL — ABNORMAL HIGH (ref 70–99)
Potassium: 4.6 mmol/L (ref 3.5–5.1)
Sodium: 143 mmol/L (ref 135–145)
Total Bilirubin: 0.6 mg/dL (ref 0.3–1.2)
Total Protein: 6.6 g/dL (ref 6.5–8.1)

## 2022-06-17 NOTE — Progress Notes (Signed)
[facility] OFFICE PROGRESS NOTE   Diagnosis: Anal cancer  INTERVAL HISTORY:   Alyssa Deleon returns as scheduled.  She completed cycle one 5-FU/Mitomycin-C beginning 06/06/2022.  She denies nausea/vomiting.  She had a single mouth sore which has resolved.  She had diarrhea earlier this week with multiple large volume stools over the course of 1 day.  She noted good relief with Imodium.  Objective:  Vital signs in last 24 hours:  Blood pressure 118/62, pulse 93, temperature 98.1 F (36.7 C), temperature source Oral, resp. rate 18, height 5\' 7"  (1.702 m), weight 182 lb (82.6 kg), SpO2 98 %.    HEENT: No thrush or ulcers. Resp: Lungs clear bilaterally. Cardio: Regular rate and rhythm. GI: Abdomen soft and nontender.  No hepatosplenomegaly. Vascular: No leg edema. Skin: Perianal skin with mild erythema/hyperpigmentation, no skin breakdown.  Faint erythema at the labia.   Lab Results:  Lab Results  Component Value Date   WBC 2.3 (L) 06/17/2022   HGB 11.9 (L) 06/17/2022   HCT 36.6 06/17/2022   MCV 90.8 06/17/2022   PLT 211 06/17/2022   NEUTROABS 1.2 (L) 06/17/2022    Imaging:  MR LIVER W WO CONTRAST  Result Date: 06/16/2022 CLINICAL DATA:  Anal squamous cell carcinoma. Mildly hypermetabolic liver lesion on recent PET-CT. EXAM: MRI ABDOMEN WITHOUT AND WITH CONTRAST TECHNIQUE: Multiplanar multisequence MR imaging of the abdomen was performed both before and after the administration of intravenous contrast. CONTRAST:  8.45mL GADAVIST GADOBUTROL 1 MMOL/ML IV SOLN COMPARISON:  PET-CT on 05/30/2022 and CT on 05/12/2022 FINDINGS: Lower chest: No acute findings. Hepatobiliary: A 1 cm mass is seen in segment 2 of the left hepatic lobe which shows arterial phase hyperenhancement, lack of contrast washout, and mild-to-moderate T2 hyperintensity. This also shows lack of restricted diffusion. No other hepatic masses are identified. Few tiny sub-cm cysts are noted. Prior  cholecystectomy. No evidence of biliary obstruction. Pancreas:  No mass or inflammatory changes. Spleen:  Within normal limits in size and appearance. Adrenals/Urinary Tract: No suspicious masses identified. No evidence of hydronephrosis. Stomach/Bowel: Unremarkable. Vascular/Lymphatic: No pathologically enlarged lymph nodes identified. No acute vascular findings. Other:  None. Musculoskeletal:  No suspicious bone lesions identified. IMPRESSION: 1 cm hypervascular mass in segment 2 of the left hepatic lobe, which corresponds to the focus of hypermetabolism on recent PET-CT. Imaging characteristics are atypical for metastatic squamous cell carcinoma, although this cannot definitely be excluded. Differential diagnosis also includes benign etiologies such as atypical hemangioma focal nodular hyperplasia, and hepatic adenoma. Consider attempted tissue sampling versus short-term follow-up by MRI in 3 months. No other evidence of metastatic disease identified within the abdomen. Electronically Signed   By: Marlaine Hind M.D.   On: 06/16/2022 11:36    Medications: I have reviewed the patient's current medications.  Assessment/Plan: Anal cancer Anal mass noted on digital exam and colonoscopy 05/09/2022-biopsy consistent with squamous cell carcinoma CTs 05/12/2022-no evidence of thoracic metastatic disease, calcified right pleural plaques, asymmetric thickening of the right posterior wall of the distal rectum/anal canal, no evidence of metastatic adenopathy in the abdomen or pelvis, 2 small hypodense left liver lesions favored to represent benign cysts PET scan [id number] hypermetabolic activity at the anus.  No evidence of metastatic adenopathy.  Indeterminate small, mildly hypermetabolic lesion in the lateral segment of the left hepatic lobe. Radiation 06/06/2022  Cycle one 5-FU/Mitomycin-C 06/06/2022 MRI liver 06/15/2022-1 cm hypervascular mass in segment 2 of the left hepatic lobe, corresponds to the focus of  hypermetabolism on recent  PET-CT.  Imaging characteristics atypical for metastatic squamous cell carcinoma.  Follow-up MRI in 3 months. Depression Left breast cancer 1995 Recurrent left breast cancer 2003?  Status post a left mastectomy and TRAM Family history of multiple cancers she reports being negative for a BRCA mutation  Disposition: Alyssa Deleon appears stable.  She continues radiation.  She completed cycle one 5-FU/Mitomycin-C beginning 06/06/2022.  Overall she tolerated well.  We reviewed the chemotherapy schedule.  She understands a PICC line will be placed on 07/01/2022 with the plan for cycle two 5-FU/Mitomycin-C beginning 07/04/2022.  CBC and chemistry panel reviewed.  She has mild neutropenia.  Precautions reviewed.  She understands to contact the office with fever, chills, other signs of infection.  Plan for repeat CBC on 06/21/2022.  The recent liver MRI showed a 1 cm hypervascular mass left hepatic lobe corresponding to the focus of hypermetabolism on the PET scan.  Noted that imaging characteristics are atypical for metastatic squamous cell carcinoma.  We will continue to follow with another MRI in 3 months.  She agrees with this plan.  Plan for CBC on 06/21/2022.  We will see her in follow-up prior to cycle two 5-FU/Mitomycin-C on 07/03/2020.  She will contact the office in the interim with any problems.  Ned Card ANP/GNP-BC   06/17/2022  8:48 AM

## 2022-06-17 NOTE — Telephone Encounter (Signed)
Several call attempts, no answer. VM left for pt with information regading PICC line placement appt on 07/01/2022. Time, date, location given. Pt instructed to arrive at 1300 for procedure. Pt instructed that interventional radiology will be calling her to confirm appt as well.

## 2022-06-18 ENCOUNTER — Inpatient Hospital Stay: Admit: 2022-06-18 | Discharge: 2022-06-18 | Payer: BLUE CROSS/BLUE SHIELD | Primary: Internal Medicine

## 2022-06-18 DIAGNOSIS — S22000A Wedge compression fracture of unspecified thoracic vertebra, initial encounter for closed fracture: Secondary | ICD-10-CM

## 2022-06-20 ENCOUNTER — Ambulatory Visit
Admission: RE | Admit: 2022-06-20 | Discharge: 2022-06-20 | Disposition: A | Discharge status: Home / Self Care | Payer: Medicare Other | Source: Ambulatory Visit | Attending: Radiation Oncology | Admitting: Radiation Oncology

## 2022-06-20 ENCOUNTER — Other Ambulatory Visit: Payer: Self-pay

## 2022-06-20 DIAGNOSIS — Z510 Encounter for antineoplastic radiation therapy: Secondary | ICD-10-CM | POA: Diagnosis not present

## 2022-06-20 LAB — RAD ONC ARIA SESSION SUMMARY
Course Elapsed Days: 14
Plan Fractions Treated to Date: 10
Plan Prescribed Dose Per Fraction: 1.8 Gy
Plan Total Fractions Prescribed: 28
Plan Total Prescribed Dose: 50.4 Gy
Reference Point Dosage Given to Date: 18 Gy
Reference Point Session Dosage Given: 1.8 Gy
Session Number: 10

## 2022-06-21 ENCOUNTER — Ambulatory Visit
Admission: RE | Admit: 2022-06-21 | Discharge: 2022-06-21 | Disposition: A | Discharge status: Home / Self Care | Payer: Medicare Other | Source: Ambulatory Visit | Attending: Radiation Oncology | Admitting: Radiation Oncology

## 2022-06-21 ENCOUNTER — Other Ambulatory Visit: Payer: Self-pay

## 2022-06-21 ENCOUNTER — Inpatient Hospital Stay: Payer: Medicare Other

## 2022-06-21 DIAGNOSIS — C210 Malignant neoplasm of anus, unspecified: Secondary | ICD-10-CM

## 2022-06-21 DIAGNOSIS — Z510 Encounter for antineoplastic radiation therapy: Secondary | ICD-10-CM | POA: Diagnosis not present

## 2022-06-21 LAB — RAD ONC ARIA SESSION SUMMARY
Course Elapsed Days: 15
Plan Fractions Treated to Date: 11
Plan Prescribed Dose Per Fraction: 1.8 Gy
Plan Total Fractions Prescribed: 28
Plan Total Prescribed Dose: 50.4 Gy
Reference Point Dosage Given to Date: 19.8 Gy
Reference Point Session Dosage Given: 1.8 Gy
Session Number: 11

## 2022-06-21 LAB — CBC WITH DIFFERENTIAL (CANCER CENTER ONLY)
Abs Immature Granulocytes: 0.04 10*3/uL (ref 0.00–0.07)
Basophils Absolute: 0 10*3/uL (ref 0.0–0.1)
Basophils Relative: 1 %
Eosinophils Absolute: 0.2 10*3/uL (ref 0.0–0.5)
Eosinophils Relative: 5 %
HCT: 32.1 % — ABNORMAL LOW (ref 36.0–46.0)
Hemoglobin: 10.9 g/dL — ABNORMAL LOW (ref 12.0–15.0)
Immature Granulocytes: 1 %
Lymphocytes Relative: 12 %
Lymphs Abs: 0.4 10*3/uL — ABNORMAL LOW (ref 0.7–4.0)
MCH: 31 pg (ref 26.0–34.0)
MCHC: 34 g/dL (ref 30.0–36.0)
MCV: 91.2 fL (ref 80.0–100.0)
Monocytes Absolute: 0.4 10*3/uL (ref 0.1–1.0)
Monocytes Relative: 12 %
Neutro Abs: 2.2 10*3/uL (ref 1.7–7.7)
Neutrophils Relative %: 69 %
Platelet Count: 117 10*3/uL — ABNORMAL LOW (ref 150–400)
RBC: 3.52 MIL/uL — ABNORMAL LOW (ref 3.87–5.11)
RDW: 13 % (ref 11.5–15.5)
WBC Count: 3.1 10*3/uL — ABNORMAL LOW (ref 4.0–10.5)
nRBC: 0 % (ref 0.0–0.2)

## 2022-06-22 ENCOUNTER — Ambulatory Visit
Admission: RE | Admit: 2022-06-22 | Discharge: 2022-06-22 | Disposition: A | Discharge status: Home / Self Care | Payer: Medicare Other | Source: Ambulatory Visit | Attending: Radiation Oncology | Admitting: Radiation Oncology

## 2022-06-22 ENCOUNTER — Encounter: Payer: Self-pay | Admitting: *Deleted

## 2022-06-22 ENCOUNTER — Other Ambulatory Visit: Payer: Self-pay

## 2022-06-22 DIAGNOSIS — Z510 Encounter for antineoplastic radiation therapy: Secondary | ICD-10-CM | POA: Diagnosis not present

## 2022-06-22 LAB — RAD ONC ARIA SESSION SUMMARY
Course Elapsed Days: 16
Plan Fractions Treated to Date: 12
Plan Prescribed Dose Per Fraction: 1.8 Gy
Plan Total Fractions Prescribed: 28
Plan Total Prescribed Dose: 50.4 Gy
Reference Point Dosage Given to Date: 21.6 Gy
Reference Point Session Dosage Given: 1.8 Gy
Session Number: 12

## 2022-06-23 ENCOUNTER — Other Ambulatory Visit: Payer: Self-pay

## 2022-06-23 ENCOUNTER — Ambulatory Visit
Admission: RE | Admit: 2022-06-23 | Discharge: 2022-06-23 | Disposition: A | Discharge status: Home / Self Care | Payer: Medicare Other | Source: Ambulatory Visit | Attending: Radiation Oncology | Admitting: Radiation Oncology

## 2022-06-23 DIAGNOSIS — Z510 Encounter for antineoplastic radiation therapy: Secondary | ICD-10-CM | POA: Diagnosis not present

## 2022-06-23 LAB — RAD ONC ARIA SESSION SUMMARY
Course Elapsed Days: 17
Plan Fractions Treated to Date: 13
Plan Prescribed Dose Per Fraction: 1.8 Gy
Plan Total Fractions Prescribed: 28
Plan Total Prescribed Dose: 50.4 Gy
Reference Point Dosage Given to Date: 23.4 Gy
Reference Point Session Dosage Given: 1.8 Gy
Session Number: 13

## 2022-06-24 ENCOUNTER — Ambulatory Visit: Payer: Medicare Other

## 2022-06-24 ENCOUNTER — Other Ambulatory Visit: Payer: Self-pay | Admitting: Physician Assistant

## 2022-06-25 ENCOUNTER — Encounter: Admit: 2022-06-25 | Payer: PRIVATE HEALTH INSURANCE | Primary: Internal Medicine

## 2022-06-25 ENCOUNTER — Inpatient Hospital Stay: Admit: 2022-06-25 | Discharge: 2022-06-26 | Payer: BLUE CROSS/BLUE SHIELD

## 2022-06-25 ENCOUNTER — Emergency Department: Admit: 2022-06-25 | Payer: BLUE CROSS/BLUE SHIELD | Primary: Internal Medicine

## 2022-06-25 DIAGNOSIS — R109 Unspecified abdominal pain: Secondary | ICD-10-CM

## 2022-06-25 MED ORDER — SODIUM CHLORIDE 0.9 % BOLUS (NEW BAG)
0.9 % | Freq: Once | INTRAVENOUS | Status: CP
Start: 2022-06-25 — End: ?
  Administered 2022-06-26: 02:00:00 0.9 mL/h via INTRAVENOUS

## 2022-06-25 MED ORDER — SODIUM CHLORIDE 0.9 % LARGE VOLUME SYRINGE FOR AUTOINJECTOR
Freq: Once | INTRAVENOUS | Status: CP | PRN
Start: 2022-06-25 — End: ?
  Administered 2022-06-26: 03:00:00 via INTRAVENOUS

## 2022-06-25 MED ORDER — IOHEXOL 350 MG IODINE/ML INTRAVENOUS SOLUTION
350 mg iodine/mL | Freq: Once | INTRAVENOUS | Status: CP | PRN
Start: 2022-06-25 — End: ?
  Administered 2022-06-26: 03:00:00 350 mL via INTRAVENOUS

## 2022-06-26 DIAGNOSIS — Z85828 Personal history of other malignant neoplasm of skin: Secondary | ICD-10-CM

## 2022-06-26 DIAGNOSIS — K529 Noninfective gastroenteritis and colitis, unspecified: Secondary | ICD-10-CM

## 2022-06-26 DIAGNOSIS — Z87891 Personal history of nicotine dependence: Secondary | ICD-10-CM

## 2022-06-26 DIAGNOSIS — Z79631 Long term (current) use of antimetabolite agent: Secondary | ICD-10-CM

## 2022-06-26 DIAGNOSIS — Z880 Allergy status to penicillin: Secondary | ICD-10-CM

## 2022-06-26 LAB — HEPATIC FUNCTION PANEL
BKR A/G RATIO: 1.1 (ref 1.0–2.2)
BKR ALANINE AMINOTRANSFERASE (ALT): 34 U/L (ref 10–35)
BKR ALBUMIN: 3.6 g/dL (ref 3.6–4.9)
BKR ALKALINE PHOSPHATASE: 80 U/L (ref 9–122)
BKR ASPARTATE AMINOTRANSFERASE (AST): 44 U/L — ABNORMAL HIGH (ref 10–35)
BKR AST/ALT RATIO: 1.3
BKR BILIRUBIN DIRECT: <0.2 mg/dL (ref <=0.3)
BKR BILIRUBIN TOTAL: 0.5 mg/dL (ref <=1.2)
BKR GLOBULIN: 3.3 g/dL (ref 2.3–3.5)
BKR PROTEIN TOTAL: 6.9 g/dL (ref 6.6–8.7)

## 2022-06-26 LAB — CBC WITH AUTO DIFFERENTIAL
BKR CLARITY, UA: 294 x1000/??L (ref 150–420)
BKR CREATININE: 30.6 pg (ref 27.0–33.0)
BKR WAM ABSOLUTE IMMATURE GRANULOCYTES.: 0.05 x 1000/??L (ref 0.00–0.30)
BKR WAM ABSOLUTE LYMPHOCYTE COUNT.: 1.45 x 1000/ÂµL (ref 0.60–3.70)
BKR WAM ABSOLUTE NRBC (2 DEC): 0 x 1000/??L (ref 0.00–1.00)
BKR WAM ANALYZER ANC: 11.44 x 1000/??L — ABNORMAL HIGH (ref 2.00–7.60)
BKR WAM BASOPHIL ABSOLUTE COUNT.: 0.05 x 1000/??L (ref 0.00–1.00)
BKR WAM BASOPHILS: 0.4 % (ref 0.0–1.4)
BKR WAM EOSINOPHIL ABSOLUTE COUNT.: 0.2 x 1000/??L (ref 0.00–1.00)
BKR WAM EOSINOPHILS: 1.4 % (ref 0.0–5.0)
BKR WAM HEMATOCRIT (2 DEC): 44.6 % (ref 35.00–45.00)
BKR WAM HEMOGLOBIN: 14.5 g/dL (ref 11.7–15.5)
BKR WAM IMMATURE GRANULOCYTES: 0.4 % (ref 0.0–1.0)
BKR WAM LYMPHOCYTES: 10.2 % — ABNORMAL LOW (ref 5.5–7.5)
BKR WAM MCH (PG): 30.6 pg (ref 27.0–33.0)
BKR WAM MCHC: 32.5 g/dL (ref 31.0–36.0)
BKR WAM MCV: 94.1 fL (ref 80.0–100.0)
BKR WAM MONOCYTE ABSOLUTE COUNT.: 1.04 x 1000/ÂµL — ABNORMAL HIGH (ref 0.00–1.00)
BKR WAM MONOCYTES: 7.3 % (ref 0–2)
BKR WAM MPV: 8.8 fL (ref 8.0–12.0)
BKR WAM NUCLEATED RED BLOOD CELLS: 0 % (ref 0.0–1.0)
BKR WAM PLATELETS: 294 x1000/ÂµL (ref 150–420)
BKR WAM RDW-CV: 12 % (ref 11.0–15.0)
BKR WAM RED BLOOD CELL COUNT.: 4.74 M/??L (ref 4.00–6.00)
BKR WAM WHITE BLOOD CELL COUNT: 14.2 x1000/??L — ABNORMAL HIGH (ref 4.0–11.0)

## 2022-06-26 LAB — BASIC METABOLIC PANEL
BKR ANION GAP: 9 (ref 7–17)
BKR BLOOD UREA NITROGEN: 15 mg/dL (ref 8–23)
BKR BUN / CREAT RATIO: 31.9 % — ABNORMAL HIGH (ref 8.0–23.0)
BKR CALCIUM: 8.7 mg/dL — ABNORMAL LOW (ref 8.8–10.2)
BKR CHLORIDE: 101 mmol/L (ref 98–107)
BKR CO2: 26 mmol/L (ref 20–30)
BKR EGFR, CREATININE (CKD-EPI 2021): >60 mL/min/{1.73_m2} (ref >=60)
BKR GLUCOSE: 103 mg/dL — ABNORMAL HIGH (ref 70–100)
BKR POTASSIUM: 4.5 mmol/L (ref 3.3–5.3)
BKR SODIUM: 136 mmol/L (ref 136–144)

## 2022-06-26 LAB — URINALYSIS WITH CULTURE REFLEX      (BH LMW YH)
BKR BILIRUBIN, UA: NEGATIVE
BKR GLUCOSE, UA: NEGATIVE
BKR KETONES, UA: NEGATIVE % — AB (ref 0.0–1.4)
BKR NITRITE, UA: NEGATIVE
BKR PH, UA: 6.5 (ref 5.5–7.5)
BKR SPECIFIC GRAVITY, UA: >1.05 — ABNORMAL HIGH (ref 1.005–1.030)
BKR UROBILINOGEN, UA (MG/DL): <2 mg/dL — ABNORMAL HIGH (ref 0.00–<=2.0)
BKR WAM NEUTROPHILS: >1.05 % — ABNORMAL HIGH (ref 1.005–1.030)
BKR WBC/HPF: NEGATIVE /HPF — AB (ref 0.0–5.0)

## 2022-06-26 LAB — URINE MICROSCOPIC     (BH GH LMW YH)

## 2022-06-26 LAB — LIPASE: BKR LIPASE: 22 U/L (ref 11–55)

## 2022-06-26 LAB — MAGNESIUM: BKR MAGNESIUM: 2.1 mg/dL (ref 1.7–2.4)

## 2022-06-26 LAB — UA REFLEX CULTURE

## 2022-06-26 NOTE — ED Provider Notes
Chief Complaint Patient presents with  Abdominal Pain   Patient with abdominal pain since Wednesday.  Today worse to right lower abdomen. Patient states she ate breakfast and had diarrhea.  Symptoms worsened throughout the day.  Today ate english muffin with apple butter and noodles.  No nausea or vomiting.  Taking Pepto bismol.  No diarrhea today. No fevers or chills. No GU complaints  MDM History of compression fracture of thoracic vertebra, basal cell carcinoma, bronchiectasis, nicotine dependence coming in with complaints of right lower quadrant abdominal pain.  Denies any fevers or chills, nausea or vomiting, dysuria.  Reports that she was had several episodes of diarrhea the other day that have passed.Differential includes intra-abdominal infection, appendicitis, kidney stone, colitis, consider but doubt AAANo CVA tendernessRight lower quadrant tenderness to palpation without guarding or reboundCT scan shows colitisUrinalysis questionable for UTI but patient denies any symptoms and will wait for cultures to returnReturn precautions discussed regarding fever and worsening of her symptomsDr. Ladona Ridgel available Physical ExamED Triage Vitals [06/25/22 2043]BP: (!) 127/40Pulse: (!) 104Pulse from  O2 sat: n/aResp: 18Temp: 98 F (36.7 C)Temp src: TemporalSpO2: 97 % BP 124/68  - Pulse (!) 97  - Temp 98 F (36.7 C) (Temporal)  - Resp 18  - Ht 5' 4 (1.626 m)  - Wt 47.6 kg  - SpO2 98%  - BMI 18.02 kg/m Physical Exam ProceduresAttestation/Critical CareClinical Impressions as of 06/26/22 0153 Colitis  ED DispositionDischarge Sherron Monday, PA03/31/24 0153

## 2022-06-26 NOTE — Discharge Instructions
Please follow up with the primary care doctor this coming week as needed. If you develop fever or worsening pain please return to the ED. If you urine culture shows a urinary tract infection we will call you with antibiotics.

## 2022-06-26 NOTE — ED Notes
11:48 PM Chief Complaint Patient presents with  Abdominal Pain   Patient with abdominal pain since Wednesday.  Today worse to right lower abdomen. Patient states she ate breakfast and had diarrhea.  Symptoms worsened throughout the day.  Today ate english muffin with apple butter and noodles.  No nausea or vomiting.  Taking Pepto bismol.  No diarrhea today. No fevers or chills. No GU complaints  Pt is a/ox4 with cc of RLQ abd pain since Wednesday along w/ diarrhea. Denies N/V. No GI Hx per pt. Denies CP/SOB at this time.No past medical history on file.

## 2022-06-27 ENCOUNTER — Other Ambulatory Visit: Payer: Self-pay

## 2022-06-27 ENCOUNTER — Ambulatory Visit
Admission: RE | Admit: 2022-06-27 | Discharge: 2022-06-27 | Disposition: A | Discharge status: Home / Self Care | Payer: Medicare Other | Source: Ambulatory Visit | Attending: Radiation Oncology | Admitting: Radiation Oncology

## 2022-06-27 DIAGNOSIS — Z510 Encounter for antineoplastic radiation therapy: Secondary | ICD-10-CM | POA: Diagnosis present

## 2022-06-27 DIAGNOSIS — C210 Malignant neoplasm of anus, unspecified: Secondary | ICD-10-CM | POA: Diagnosis present

## 2022-06-27 DIAGNOSIS — Z5111 Encounter for antineoplastic chemotherapy: Secondary | ICD-10-CM | POA: Insufficient documentation

## 2022-06-27 LAB — URINE CULTURE

## 2022-06-27 LAB — RAD ONC ARIA SESSION SUMMARY
Course Elapsed Days: 21
Plan Fractions Treated to Date: 14
Plan Prescribed Dose Per Fraction: 1.8 Gy
Plan Total Fractions Prescribed: 28
Plan Total Prescribed Dose: 50.4 Gy
Reference Point Dosage Given to Date: 25.2 Gy
Reference Point Session Dosage Given: 1.643 Gy
Session Number: 14

## 2022-06-28 ENCOUNTER — Other Ambulatory Visit: Payer: Self-pay

## 2022-06-28 ENCOUNTER — Ambulatory Visit
Admission: RE | Admit: 2022-06-28 | Discharge: 2022-06-28 | Disposition: A | Discharge status: Home / Self Care | Payer: Medicare Other | Source: Ambulatory Visit | Attending: Radiation Oncology | Admitting: Radiation Oncology

## 2022-06-28 ENCOUNTER — Telehealth: Admit: 2022-06-28 | Payer: PRIVATE HEALTH INSURANCE | Attending: Internal Medicine | Primary: Internal Medicine

## 2022-06-28 DIAGNOSIS — Z510 Encounter for antineoplastic radiation therapy: Secondary | ICD-10-CM | POA: Diagnosis not present

## 2022-06-28 LAB — RAD ONC ARIA SESSION SUMMARY
Course Elapsed Days: 22
Plan Fractions Treated to Date: 15
Plan Prescribed Dose Per Fraction: 1.8 Gy
Plan Total Fractions Prescribed: 28
Plan Total Prescribed Dose: 50.4 Gy
Reference Point Dosage Given to Date: 27 Gy
Reference Point Session Dosage Given: 1.8 Gy
Session Number: 15

## 2022-06-29 ENCOUNTER — Ambulatory Visit
Admission: RE | Admit: 2022-06-29 | Discharge: 2022-06-29 | Disposition: A | Discharge status: Home / Self Care | Payer: Medicare Other | Source: Ambulatory Visit | Attending: Radiation Oncology | Admitting: Radiation Oncology

## 2022-06-29 ENCOUNTER — Other Ambulatory Visit: Payer: Self-pay | Admitting: *Deleted

## 2022-06-29 ENCOUNTER — Other Ambulatory Visit: Payer: Self-pay

## 2022-06-29 ENCOUNTER — Encounter: Admit: 2022-06-29 | Payer: BLUE CROSS/BLUE SHIELD | Primary: Internal Medicine

## 2022-06-29 ENCOUNTER — Encounter: Admit: 2022-06-29 | Payer: PRIVATE HEALTH INSURANCE | Primary: Internal Medicine

## 2022-06-29 ENCOUNTER — Telehealth: Admit: 2022-06-29 | Payer: PRIVATE HEALTH INSURANCE | Primary: Internal Medicine

## 2022-06-29 DIAGNOSIS — Z510 Encounter for antineoplastic radiation therapy: Secondary | ICD-10-CM | POA: Diagnosis not present

## 2022-06-29 DIAGNOSIS — C210 Malignant neoplasm of anus, unspecified: Secondary | ICD-10-CM

## 2022-06-29 LAB — RAD ONC ARIA SESSION SUMMARY
Course Elapsed Days: 23
Plan Fractions Treated to Date: 16
Plan Prescribed Dose Per Fraction: 1.8 Gy
Plan Total Fractions Prescribed: 28
Plan Total Prescribed Dose: 50.4 Gy
Reference Point Dosage Given to Date: 28.8 Gy
Reference Point Session Dosage Given: 1.8 Gy
Session Number: 16

## 2022-06-30 ENCOUNTER — Ambulatory Visit
Admission: RE | Admit: 2022-06-30 | Discharge: 2022-06-30 | Disposition: A | Discharge status: Home / Self Care | Payer: Medicare Other | Source: Ambulatory Visit | Attending: Radiation Oncology | Admitting: Radiation Oncology

## 2022-06-30 ENCOUNTER — Other Ambulatory Visit: Payer: Self-pay

## 2022-06-30 DIAGNOSIS — Z510 Encounter for antineoplastic radiation therapy: Secondary | ICD-10-CM | POA: Diagnosis not present

## 2022-06-30 LAB — RAD ONC ARIA SESSION SUMMARY
Course Elapsed Days: 24
Plan Fractions Treated to Date: 17
Plan Prescribed Dose Per Fraction: 1.8 Gy
Plan Total Fractions Prescribed: 28
Plan Total Prescribed Dose: 50.4 Gy
Reference Point Dosage Given to Date: 30.6 Gy
Reference Point Session Dosage Given: 1.8 Gy
Session Number: 17

## 2022-07-01 ENCOUNTER — Ambulatory Visit (HOSPITAL_COMMUNITY)
Admission: RE | Admit: 2022-07-01 | Discharge: 2022-07-01 | Disposition: A | Discharge status: Home / Self Care | Payer: Medicare Other | Source: Ambulatory Visit | Attending: Nurse Practitioner | Admitting: Nurse Practitioner

## 2022-07-01 ENCOUNTER — Other Ambulatory Visit: Payer: Self-pay | Admitting: Nurse Practitioner

## 2022-07-01 ENCOUNTER — Ambulatory Visit: Admission: RE | Admit: 2022-07-01 | Payer: Medicare Other | Source: Ambulatory Visit

## 2022-07-01 ENCOUNTER — Other Ambulatory Visit: Payer: Self-pay

## 2022-07-01 ENCOUNTER — Ambulatory Visit
Admission: RE | Admit: 2022-07-01 | Discharge: 2022-07-01 | Disposition: A | Discharge status: Home / Self Care | Payer: Medicare Other | Source: Ambulatory Visit | Attending: Radiation Oncology | Admitting: Radiation Oncology

## 2022-07-01 ENCOUNTER — Inpatient Hospital Stay: Admit: 2022-07-01 | Discharge: 2022-07-01 | Payer: BLUE CROSS/BLUE SHIELD | Primary: Internal Medicine

## 2022-07-01 DIAGNOSIS — J479 Bronchiectasis, uncomplicated: Secondary | ICD-10-CM

## 2022-07-01 DIAGNOSIS — Z510 Encounter for antineoplastic radiation therapy: Secondary | ICD-10-CM | POA: Diagnosis not present

## 2022-07-01 DIAGNOSIS — C210 Malignant neoplasm of anus, unspecified: Secondary | ICD-10-CM

## 2022-07-01 LAB — RAD ONC ARIA SESSION SUMMARY
Course Elapsed Days: 25
Plan Fractions Treated to Date: 18
Plan Prescribed Dose Per Fraction: 1.8 Gy
Plan Total Fractions Prescribed: 28
Plan Total Prescribed Dose: 50.4 Gy
Reference Point Dosage Given to Date: 32.4 Gy
Reference Point Session Dosage Given: 1.8 Gy
Session Number: 18

## 2022-07-01 MED ORDER — LIDOCAINE HCL 1 % IJ SOLN
10.0000 mL | Freq: Once | INTRAMUSCULAR | Status: AC
  Administered 2022-07-01: 10 mL via INTRADERMAL

## 2022-07-01 MED ORDER — LIDOCAINE HCL 1 % IJ SOLN
INTRAMUSCULAR | Status: AC
  Filled 2022-07-01: qty 20

## 2022-07-01 MED ORDER — HEPARIN SOD (PORK) LOCK FLUSH 100 UNIT/ML IV SOLN
INTRAVENOUS | Status: AC
  Filled 2022-07-01: qty 5

## 2022-07-01 MED ORDER — HEPARIN SOD (PORK) LOCK FLUSH 100 UNIT/ML IV SOLN
500.0000 [IU] | Freq: Once | INTRAVENOUS | Status: AC
  Administered 2022-07-01: 500 [IU] via INTRAVENOUS

## 2022-07-01 NOTE — Procedures (Signed)
Right DL basilic vein PICC placed; length 41 cm; tip SVC/RA junction. No immediate complications. Ok to use. Medication used- 1% lidocaine to skin/SQ tissue. EBL< 2 cc.

## 2022-07-03 ENCOUNTER — Other Ambulatory Visit: Payer: Self-pay | Admitting: Oncology

## 2022-07-03 DIAGNOSIS — C210 Malignant neoplasm of anus, unspecified: Secondary | ICD-10-CM

## 2022-07-04 ENCOUNTER — Other Ambulatory Visit: Payer: Self-pay | Admitting: *Deleted

## 2022-07-04 ENCOUNTER — Inpatient Hospital Stay: Payer: Medicare Other

## 2022-07-04 ENCOUNTER — Inpatient Hospital Stay: Payer: Medicare Other | Admitting: Oncology

## 2022-07-04 ENCOUNTER — Ambulatory Visit
Admission: RE | Admit: 2022-07-04 | Discharge: 2022-07-04 | Disposition: A | Discharge status: Home / Self Care | Payer: Medicare Other | Source: Ambulatory Visit | Attending: Radiation Oncology | Admitting: Radiation Oncology

## 2022-07-04 ENCOUNTER — Encounter: Payer: Self-pay | Admitting: *Deleted

## 2022-07-04 ENCOUNTER — Other Ambulatory Visit: Payer: Self-pay

## 2022-07-04 ENCOUNTER — Encounter: Admit: 2022-07-04 | Payer: PRIVATE HEALTH INSURANCE | Primary: Internal Medicine

## 2022-07-04 VITALS — BP 120/77 | HR 91 | Resp 18

## 2022-07-04 VITALS — BP 117/90 | HR 100 | Temp 98.2°F | Resp 18 | Ht 67.0 in | Wt 178.0 lb

## 2022-07-04 DIAGNOSIS — R918 Other nonspecific abnormal finding of lung field: Secondary | ICD-10-CM

## 2022-07-04 DIAGNOSIS — C210 Malignant neoplasm of anus, unspecified: Secondary | ICD-10-CM | POA: Insufficient documentation

## 2022-07-04 DIAGNOSIS — Z5111 Encounter for antineoplastic chemotherapy: Secondary | ICD-10-CM | POA: Insufficient documentation

## 2022-07-04 DIAGNOSIS — Z510 Encounter for antineoplastic radiation therapy: Secondary | ICD-10-CM | POA: Diagnosis not present

## 2022-07-04 LAB — RAD ONC ARIA SESSION SUMMARY
Course Elapsed Days: 28
Plan Fractions Treated to Date: 19
Plan Prescribed Dose Per Fraction: 1.8 Gy
Plan Total Fractions Prescribed: 28
Plan Total Prescribed Dose: 50.4 Gy
Reference Point Dosage Given to Date: 34.2 Gy
Reference Point Session Dosage Given: 1.8 Gy
Session Number: 19

## 2022-07-04 LAB — CMP (CANCER CENTER ONLY)
ALT: 6 U/L (ref 0–44)
AST: 10 U/L — ABNORMAL LOW (ref 15–41)
Albumin: 4.2 g/dL (ref 3.5–5.0)
Alkaline Phosphatase: 52 U/L (ref 38–126)
Anion gap: 10 (ref 5–15)
BUN: 13 mg/dL (ref 8–23)
CO2: 26 mmol/L (ref 22–32)
Calcium: 9.1 mg/dL (ref 8.9–10.3)
Chloride: 102 mmol/L (ref 98–111)
Creatinine: 0.66 mg/dL (ref 0.44–1.00)
GFR, Estimated: >60 mL/min (ref >60)
Glucose, Bld: 137 mg/dL — ABNORMAL HIGH (ref 70–99)
Potassium: 3.6 mmol/L (ref 3.5–5.1)
Sodium: 138 mmol/L (ref 135–145)
Total Bilirubin: 0.7 mg/dL (ref 0.3–1.2)
Total Protein: 6.3 g/dL — ABNORMAL LOW (ref 6.5–8.1)

## 2022-07-04 LAB — CBC WITH DIFFERENTIAL (CANCER CENTER ONLY)
Abs Immature Granulocytes: 0.02 10*3/uL (ref 0.00–0.07)
Basophils Absolute: 0 10*3/uL (ref 0.0–0.1)
Basophils Relative: 0 %
Eosinophils Absolute: 0.2 10*3/uL (ref 0.0–0.5)
Eosinophils Relative: 3 %
HCT: 34.9 % — ABNORMAL LOW (ref 36.0–46.0)
Hemoglobin: 11.6 g/dL — ABNORMAL LOW (ref 12.0–15.0)
Immature Granulocytes: 0 %
Lymphocytes Relative: 9 %
Lymphs Abs: 0.4 10*3/uL — ABNORMAL LOW (ref 0.7–4.0)
MCH: 30.4 pg (ref 26.0–34.0)
MCHC: 33.2 g/dL (ref 30.0–36.0)
MCV: 91.6 fL (ref 80.0–100.0)
Monocytes Absolute: 0.4 10*3/uL (ref 0.1–1.0)
Monocytes Relative: 9 %
Neutro Abs: 3.7 10*3/uL (ref 1.7–7.7)
Neutrophils Relative %: 79 %
Platelet Count: 125 10*3/uL — ABNORMAL LOW (ref 150–400)
RBC: 3.81 MIL/uL — ABNORMAL LOW (ref 3.87–5.11)
RDW: 14.3 % (ref 11.5–15.5)
WBC Count: 4.7 10*3/uL (ref 4.0–10.5)
nRBC: 0 % (ref 0.0–0.2)

## 2022-07-04 MED ORDER — PROCHLORPERAZINE MALEATE 10 MG PO TABS
10.0000 mg | ORAL_TABLET | Freq: Once | ORAL | Status: AC
  Administered 2022-07-04: 10 mg via ORAL
  Filled 2022-07-04: qty 1

## 2022-07-04 MED ORDER — SODIUM CHLORIDE 0.9 % IV SOLN: Freq: Once | INTRAVENOUS | Status: AC

## 2022-07-04 MED ORDER — SODIUM CHLORIDE 0.9 % IV SOLN
800.0000 mg/m2/d | INTRAVENOUS | Status: DC
  Administered 2022-07-04: 6250 mg via INTRAVENOUS
  Filled 2022-07-04: qty 125

## 2022-07-04 MED ORDER — MITOMYCIN CHEMO IV INJECTION 40 MG
10.3000 mg/m2 | Freq: Once | INTRAVENOUS | Status: AC
  Administered 2022-07-04: 20 mg via INTRAVENOUS
  Filled 2022-07-04: qty 40

## 2022-07-04 NOTE — Progress Notes (Signed)
[facility] OFFICE PROGRESS NOTE   Diagnosis: Anal cancer  INTERVAL HISTORY:   Alyssa Deleon began cycle one 5-FU/Mitomycin-C on 06/06/2022.  She continues daily radiation.  She reports developing diarrhea during the week following chemotherapy.  She has persistent intermittent diarrhea.  She takes Imodium.  She has approximately 2 diarrhea stools per day.  No mouth sores or hand/foot pain.  She has erythema at the groin. She has chronic exertional dyspnea.  This has progressed while on treatment.  No chest pain. Objective:  Vital signs in last 24 hours:  Blood pressure (!) 117/90, pulse 100, temperature 98.2 F (36.8 C), temperature source Oral, resp. rate 18, height 5\' 7"  (1.702 m), weight 178 lb (80.7 kg), SpO2 98 %.    HEENT: No thrush or ulcers Resp: Lungs clear bilaterally Cardio: Giller rate and rhythm GI: Nontender, no hepatosplenomegaly Vascular: No leg edema or erythema  Skin: Palms without erythema, erythema at the groin and labia, erythema at the perineum with superficial breakdown at the gluteal fold  Portacath/PICC-without erythema  Lab Results:  Lab Results  Component Value Date   WBC 4.7 07/04/2022   HGB 11.6 (L) 07/04/2022   HCT 34.9 (L) 07/04/2022   MCV 91.6 07/04/2022   PLT 125 (L) 07/04/2022   NEUTROABS 3.7 07/04/2022    CMP  Lab Results  Component Value Date   NA 138 07/04/2022   K 3.6 07/04/2022   CL 102 07/04/2022   CO2 26 07/04/2022   GLUCOSE 137 (H) 07/04/2022   BUN 13 07/04/2022   CREATININE 0.66 07/04/2022   CALCIUM 9.1 07/04/2022   PROT 6.3 (L) 07/04/2022   ALBUMIN 4.2 07/04/2022   AST 10 (L) 07/04/2022   ALT 6 07/04/2022   ALKPHOS 52 07/04/2022   BILITOT 0.7 07/04/2022   GFRNONAA >60 07/04/2022    No results found for: "CEA1", "CEA", "GOT157", "CA125"  No results found for: "INR", "LABPROT"  Imaging:  IR PICC PLACEMENT RIGHT >5 YRS INC IMG GUIDE  Result Date: 07/01/2022 INDICATION: Patient with history of anal  cancer; central venous access requested for chemotherapy EXAM: ULTRASOUND AND FLUOROSCOPIC GUIDED PICC LINE INSERTION MEDICATIONS: 3 ml 1% lidocaine ANESTHESIA/SEDATION: None FLUOROSCOPY TIME:  Fluoroscopy Time:  (2 mGy). COMPLICATIONS: None immediate. TECHNIQUE: The procedure, risks, benefits, and alternatives were explained to patient and informed written consent was obtained. A timeout was performed prior to the initiation of the procedure. The right upper extremity was prepped with chlorhexidine in a sterile fashion, and a sterile drape was applied covering the operative field. Maximum barrier sterile technique with sterile gowns and gloves were used for the procedure. A timeout was performed prior to the initiation of the procedure. Local anesthesia was provided with 1% lidocaine. Under direct ultrasound guidance, the right basilic vein was accessed with a micropuncture kit after the overlying soft tissues were anesthetized with 1% lidocaine. An ultrasound image was saved for documentation purposes. A guidewire was advanced to the level of the superior caval-atrial junction for measurement purposes and the PICC line was cut to length. A peel-away sheath was placed and a 41 cm, 5 [place], dual lumen was inserted to level of the superior caval-atrial junction. A post procedure spot fluoroscopic was obtained. The catheter easily aspirated and flushed and was sutured in place. A dressing was placed. The patient tolerated the procedure well without immediate post procedural complication. FINDINGS: After catheter placement, the tip lies within the superior cavoatrial junction ;the catheter aspirates and flushes normally and is ready for  immediate use. IMPRESSION: Successful ultrasound and fluoroscopic guided placement of a right basilic vein approach, 41 cm, 5 French, dual lumen PICC with tip at the superior caval-atrial junction. The PICC line is ready for immediate use. Read by: Jeananne Rama, PA-C Electronically  Signed   By: Irish Lack M.D.   On: 07/01/2022 14:13    Medications: I have reviewed the patient's current medications.   Assessment/Plan: Anal cancer Anal mass noted on digital exam and colonoscopy 05/09/2022-biopsy consistent with squamous cell carcinoma CTs 05/12/2022-no evidence of thoracic metastatic disease, calcified right pleural plaques, asymmetric thickening of the right posterior wall of the distal rectum/anal canal, no evidence of metastatic adenopathy in the abdomen or pelvis, 2 small hypodense left liver lesions favored to represent benign cysts PET scan 05/30/2022-intense hypermetabolic activity at the anus.  No evidence of metastatic adenopathy.  Indeterminate small, mildly hypermetabolic lesion in the lateral segment of the left hepatic lobe. Radiation 06/06/2022  Cycle one 5-FU/Mitomycin-C 06/06/2022 cycle two 5-FU/Mitomycin-C 07/04/2022 (5-FU dose reduced secondary to diarrhea) MRI liver 06/15/2022-1 cm hypervascular mass in segment 2 of the left hepatic lobe, corresponds to the focus of hypermetabolism on recent PET-CT.  Imaging characteristics atypical for metastatic squamous cell carcinoma.  Follow-up MRI in 3 months. Depression Left breast cancer 1995 Recurrent left breast cancer 2003?  Status post a left mastectomy and TRAM Family history of multiple cancers she reports being negative for a BRCA mutation    Disposition: Alyssa Deleon is completing concurrent chemotherapy and radiation for treatment of anal cancer.  She tolerated the first cycle of chemotherapy well.  She has intermittent diarrhea.  She will complete cycle 2 chemotherapy today.  We will dose reduce the 5-FU due to persistent diarrhea.  She will return for an office and lab visit in 2 weeks.  She will have a CBC and chemistry panel at radiation oncology next week.  [name], MD  07/04/2022  9:56 AM

## 2022-07-04 NOTE — Progress Notes (Signed)
Patient seen by Dr. Truett Perna today  Vitals are within treatment parameters.No intervention needed for BP 117/90  Labs reviewed by Dr. Truett Perna and are within treatment parameters.  Per physician team, patient is ready for treatment. Please note that modifications are being made to the treatment plan including Dose reduction on 5FU.

## 2022-07-04 NOTE — Patient Instructions (Signed)
[place] [facility]   Discharge Instructions: Thank you for choosing [facility] to provide your oncology and hematology care.   If you have a lab appointment with the Cancer Center, please go directly to the Cancer Center and check in at the registration area.   Wear comfortable clothing and clothing appropriate for easy access to any Portacath or PICC line.   We strive to give you quality time with your provider. You may need to reschedule your appointment if you arrive late (15 or more minutes).  Arriving late affects you and other patients whose appointments are after yours.  Also, if you miss three or more appointments without notifying the office, you may be dismissed from the clinic at the provider's discretion.      For prescription refill requests, have your pharmacy contact our office and allow 72 hours for refills to be completed.    Today you received the following chemotherapy and/or immunotherapy agents Mitomycin (MUTAMYCIN) & Flourouracil (ADRUCIL).      To help prevent nausea and vomiting after your treatment, we encourage you to take your nausea medication as directed.  BELOW ARE SYMPTOMS THAT SHOULD BE REPORTED IMMEDIATELY: *FEVER GREATER THAN 100.4 F (38 C) OR HIGHER *CHILLS OR SWEATING *NAUSEA AND VOMITING THAT IS NOT CONTROLLED WITH YOUR NAUSEA MEDICATION *UNUSUAL SHORTNESS OF BREATH *UNUSUAL BRUISING OR BLEEDING *URINARY PROBLEMS (pain or burning when urinating, or frequent urination) *BOWEL PROBLEMS (unusual diarrhea, constipation, pain near the anus) TENDERNESS IN MOUTH AND THROAT WITH OR WITHOUT PRESENCE OF ULCERS (sore throat, sores in mouth, or a toothache) UNUSUAL RASH, SWELLING OR PAIN  UNUSUAL VAGINAL DISCHARGE OR ITCHING   Items with * indicate a potential emergency and should be followed up as soon as possible or go to the Emergency Department if any problems should occur.  Please show the CHEMOTHERAPY ALERT CARD  or IMMUNOTHERAPY ALERT CARD at check-in to the Emergency Department and triage nurse.  Should you have questions after your visit or need to cancel or reschedule your appointment, please contact [place] [facility]  Dept: [phone]  and follow the prompts.  Office hours are 8:00 a.m. to 4:30 p.m. Monday - Friday. Please note that voicemails left after 4:00 p.m. may not be returned until the following business day.  We are closed weekends and major holidays. You have access to a nurse at all times for urgent questions. Please call the main number to the clinic Dept: [phone] and follow the prompts.   For any non-urgent questions, you may also contact your provider using MyChart. We now offer e-Visits for anyone 78 and older to request care online for non-urgent symptoms. For details visit [email or web address].   Also download the MyChart app! Go to the app store, search "MyChart", open the app, select [place], and log in with your MyChart username and password.  Mitomycin Injection What is this medication? MITOMYCIN (mye toe MYE sin) treats stomach cancer and pancreatic cancer. It works by slowing down the growth of cancer cells. This medicine may be used for other purposes; ask your health care provider or pharmacist if you have questions. COMMON BRAND NAME(S): Mutamycin What should I tell my care team before I take this medication? They need to know if you have any of these conditions: Bleeding disorders Infection, such as chickenpox, cold sores, herpes Low blood counts, such as low white cells, platelets, red blood cells Kidney disease An unusual or allergic reaction to mitomycin,  other medications, foods, dyes, or preservatives Pregnant or trying to get pregnant Breastfeeding How should I use this medication? This medication is injected into a vein. It is given by your care team in a hospital or clinic setting. Talk to your care team about the use  of this medication in children. Special care may be needed. Overdosage: If you think you have taken too much of this medicine contact a poison control center or emergency room at once. NOTE: This medicine is only for you. Do not share this medicine with others. What if I miss a dose? Keep appointments for follow-up doses. It is important not to miss your dose. Call your care team if you are unable to keep an appointment. What may interact with this medication? Interactions are not expected. This list may not describe all possible interactions. Give your health care provider a list of all the medicines, herbs, non-prescription drugs, or dietary supplements you use. Also tell them if you smoke, drink alcohol, or use illegal drugs. Some items may interact with your medicine. What should I watch for while using this medication? Your condition will be monitored carefully while you are receiving this medication. You may need blood work while taking this medication. This medication may make you feel generally unwell. This is not uncommon as chemotherapy can affect healthy cells as well as cancer cells. Report any side effects. Continue your course of treatment even though you feel ill unless your care team tells you to stop. This medication may increase your risk of getting an infection. Call your care team for advice if you get a fever, chills, sore throat, or other symptoms of a cold or flu. Do not treat yourself. Try to avoid being around people who are sick. Avoid taking medications that contain aspirin, acetaminophen, ibuprofen, naproxen, or ketoprofen unless instructed by your care team. These medications may hide a fever. This medication may increase your risk to bruise or bleed. Call your care team if you notice any unusual bleeding. Be careful brushing or flossing your teeth or using a toothpick because you may get an infection or bleed more easily. If you have any dental work done, tell your dentist  you are receiving this medication. Talk to your care team if you may be pregnant. Serious birth defects can occur if you take this medication during pregnancy. Contraception is recommended while taking this medication. Your care team can help you find the option that works for you. Do not breastfeed while taking this medication. What side effects may I notice from receiving this medication? Side effects that you should report to your care team as soon as possible: Allergic reactions--skin rash, itching, hives, swelling of the face, lips, tongue, or throat Dry cough, shortness of breath or trouble breathing Infection--fever, chills, cough, sore throat, wounds that don't heal, pain or trouble when passing urine, general feeling of discomfort or being unwell Kidney injury--decrease in the amount of urine, swelling of the ankles, hands, or feet Low red blood cell level--unusual weakness or fatigue, dizziness, headache, trouble breathing Stomach pain, bloody diarrhea, pale skin, unusual weakness or fatigue, decrease in the amount of urine, which may be signs of hemolytic uremic syndrome Unusual bruising or bleeding Side effects that usually do not require medical attention (report these to your care team if they continue or are bothersome): Diarrhea Hair loss Loss of appetite with weight loss Nausea Pain, redness, or swelling with sores inside the mouth or throat This list may not describe all possible  side effects. Call your doctor for medical advice about side effects. You may report side effects to FDA at 1-800-FDA-1088. Where should I keep my medication? This medication is given in a hospital or clinic. It will not be stored at home. NOTE: This sheet is a summary. It may not cover all possible information. If you have questions about this medicine, talk to your doctor, pharmacist, or health care provider.  2023 Elsevier/Gold Standard (2021-08-04 00:00:00)  Fluorouracil Injection What is this  medication? FLUOROURACIL (flure oh YOOR a sil) treats some types of cancer. It works by slowing down the growth of cancer cells. This medicine may be used for other purposes; ask your health care provider or pharmacist if you have questions. COMMON BRAND NAME(S): Adrucil What should I tell my care team before I take this medication? They need to know if you have any of these conditions: Blood disorders Dihydropyrimidine dehydrogenase (DPD) deficiency Infection, such as chickenpox, cold sores, herpes Kidney disease Liver disease Poor nutrition Recent or ongoing radiation therapy An unusual or allergic reaction to fluorouracil, other medications, foods, dyes, or preservatives If you or your partner are pregnant or trying to get pregnant Breast-feeding How should I use this medication? This medication is injected into a vein. It is administered by your care team in a hospital or clinic setting. Talk to your care team about the use of this medication in children. Special care may be needed. Overdosage: If you think you have taken too much of this medicine contact a poison control center or emergency room at once. NOTE: This medicine is only for you. Do not share this medicine with others. What if I miss a dose? Keep appointments for follow-up doses. It is important not to miss your dose. Call your care team if you are unable to keep an appointment. What may interact with this medication? Do not take this medication with any of the following: Live virus vaccines This medication may also interact with the following: Medications that treat or prevent blood clots, such as warfarin, enoxaparin, dalteparin This list may not describe all possible interactions. Give your health care provider a list of all the medicines, herbs, non-prescription drugs, or dietary supplements you use. Also tell them if you smoke, drink alcohol, or use illegal drugs. Some items may interact with your medicine. What  should I watch for while using this medication? Your condition will be monitored carefully while you are receiving this medication. This medication may make you feel generally unwell. This is not uncommon as chemotherapy can affect healthy cells as well as cancer cells. Report any side effects. Continue your course of treatment even though you feel ill unless your care team tells you to stop. In some cases, you may be given additional medications to help with side effects. Follow all directions for their use. This medication may increase your risk of getting an infection. Call your care team for advice if you get a fever, chills, sore throat, or other symptoms of a cold or flu. Do not treat yourself. Try to avoid being around people who are sick. This medication may increase your risk to bruise or bleed. Call your care team if you notice any unusual bleeding. Be careful brushing or flossing your teeth or using a toothpick because you may get an infection or bleed more easily. If you have any dental work done, tell your dentist you are receiving this medication. Avoid taking medications that contain aspirin, acetaminophen, ibuprofen, naproxen, or ketoprofen unless instructed by  your care team. These medications may hide a fever. Do not treat diarrhea with over the counter products. Contact your care team if you have diarrhea that lasts more than 2 days or if it is severe and watery. This medication can make you more sensitive to the sun. Keep out of the sun. If you cannot avoid being in the sun, wear protective clothing and sunscreen. Do not use sun lamps, tanning beds, or tanning booths. Talk to your care team if you or your partner wish to become pregnant or think you might be pregnant. This medication can cause serious birth defects if taken during pregnancy and for 3 months after the last dose. A reliable form of contraception is recommended while taking this medication and for 3 months after the last  dose. Talk to your care team about effective forms of contraception. Do not father a child while taking this medication and for 3 months after the last dose. Use a condom while having sex during this time period. Do not breastfeed while taking this medication. This medication may cause infertility. Talk to your care team if you are concerned about your fertility. What side effects may I notice from receiving this medication? Side effects that you should report to your care team as soon as possible: Allergic reactions--skin rash, itching, hives, swelling of the face, lips, tongue, or throat Heart attack--pain or tightness in the chest, shoulders, arms, or jaw, nausea, shortness of breath, cold or clammy skin, feeling faint or lightheaded Heart failure--shortness of breath, swelling of the ankles, feet, or hands, sudden weight gain, unusual weakness or fatigue Heart rhythm changes--fast or irregular heartbeat, dizziness, feeling faint or lightheaded, chest pain, trouble breathing High ammonia level--unusual weakness or fatigue, confusion, loss of appetite, nausea, vomiting, seizures Infection--fever, chills, cough, sore throat, wounds that don't heal, pain or trouble when passing urine, general feeling of discomfort or being unwell Low red blood cell level--unusual weakness or fatigue, dizziness, headache, trouble breathing Pain, tingling, or numbness in the hands or feet, muscle weakness, change in vision, confusion or trouble speaking, loss of balance or coordination, trouble walking, seizures Redness, swelling, and blistering of the skin over hands and feet Severe or prolonged diarrhea Unusual bruising or bleeding Side effects that usually do not require medical attention (report to your care team if they continue or are bothersome): Dry skin Headache Increased tears Nausea Pain, redness, or swelling with sores inside the mouth or throat Sensitivity to light Vomiting This list may not  describe all possible side effects. Call your doctor for medical advice about side effects. You may report side effects to FDA at 1-800-FDA-1088. Where should I keep my medication? This medication is given in a hospital or clinic. It will not be stored at home. NOTE: This sheet is a summary. It may not cover all possible information. If you have questions about this medicine, talk to your doctor, pharmacist, or health care provider.  2023 Elsevier/Gold Standard (2021-07-13 00:00:00)  The chemotherapy medication bag should finish at 46 hours, 96 hours, or 7 days. For example, if your pump is scheduled for 46 hours and it was put on at 4:00 p.m., it should finish at 2:00 p.m. the day it is scheduled to come off regardless of your appointment time.     Estimated time to finish at 12:00 p.m. on Friday 07/08/2022.   If the display on your pump reads "Low Volume" and it is beeping, take the batteries out of the pump and come to the  cancer center for it to be taken off.   If the pump alarms go off prior to the pump reading "Low Volume" then call [phone] and someone can assist you.  If the plunger comes out and the chemotherapy medication is leaking out, please use your home chemo spill kit to clean up the spill. Do NOT use paper towels or other household products.  If you have problems or questions regarding your pump, please call either [phone] (24 hours a day) or the cancer center Monday-Friday 8:00 a.m.- 4:30 p.m. at the clinic number and we will assist you. If you are unable to get assistance, then go to the nearest Emergency Department and ask the staff to contact the IV team for assistance.

## 2022-07-05 ENCOUNTER — Other Ambulatory Visit: Payer: Self-pay

## 2022-07-05 ENCOUNTER — Ambulatory Visit
Admission: RE | Admit: 2022-07-05 | Discharge: 2022-07-05 | Disposition: A | Discharge status: Home / Self Care | Payer: Medicare Other | Source: Ambulatory Visit | Attending: Radiation Oncology | Admitting: Radiation Oncology

## 2022-07-05 ENCOUNTER — Encounter: Admit: 2022-07-05 | Payer: PRIVATE HEALTH INSURANCE | Attending: Internal Medicine | Primary: Internal Medicine

## 2022-07-05 DIAGNOSIS — Z510 Encounter for antineoplastic radiation therapy: Secondary | ICD-10-CM | POA: Diagnosis not present

## 2022-07-05 LAB — RAD ONC ARIA SESSION SUMMARY
Course Elapsed Days: 29
Plan Fractions Treated to Date: 20
Plan Prescribed Dose Per Fraction: 1.8 Gy
Plan Total Fractions Prescribed: 28
Plan Total Prescribed Dose: 50.4 Gy
Reference Point Dosage Given to Date: 36 Gy
Reference Point Session Dosage Given: 1.8 Gy
Session Number: 20

## 2022-07-05 NOTE — Other
11am LVM asking pt to call me back to discuss results, will try again at lunch time. Referral to thoracic oncology placed.

## 2022-07-05 NOTE — Other
Spoke with Courtney Knox. Relayed results of [place] Chest indicating a right upper lobe mass with chest wall invasion. I explained that this means she has lung cancer. I explained this 2nd part could explain the R posterior rib pain she has been having. She states she has cut back on smoking a lot, but knows she needs to quit completely. I gave her the phone number for thoracic oncology. She will call to schedule today. I also asked her to schedule f/u with me in 2 months (or can move up sooner if needed). States she called pulmonology and is still waiting for a call back from them. [place] also showed severe upper abdominal vascular calcifications and mild atherosclerotic calcifications of coronary arteries. Will plan to start statin at follow up. Will defer for now to avoid overwhelm. I remain available to her for questions or concerns that arise in the meantime.

## 2022-07-06 ENCOUNTER — Other Ambulatory Visit: Payer: Self-pay

## 2022-07-06 ENCOUNTER — Ambulatory Visit
Admission: RE | Admit: 2022-07-06 | Discharge: 2022-07-06 | Disposition: A | Discharge status: Home / Self Care | Payer: Medicare Other | Source: Ambulatory Visit | Attending: Radiation Oncology | Admitting: Radiation Oncology

## 2022-07-06 ENCOUNTER — Inpatient Hospital Stay: Admit: 2022-07-06 | Discharge: 2022-07-06 | Payer: BLUE CROSS/BLUE SHIELD | Primary: Internal Medicine

## 2022-07-06 ENCOUNTER — Ambulatory Visit: Admit: 2022-07-06 | Payer: BLUE CROSS/BLUE SHIELD | Attending: Surgery | Primary: Internal Medicine

## 2022-07-06 ENCOUNTER — Encounter: Admit: 2022-07-06 | Payer: PRIVATE HEALTH INSURANCE | Attending: Surgery | Primary: Internal Medicine

## 2022-07-06 DIAGNOSIS — K529 Noninfective gastroenteritis and colitis, unspecified: Secondary | ICD-10-CM

## 2022-07-06 DIAGNOSIS — J449 Chronic obstructive pulmonary disease, unspecified: Secondary | ICD-10-CM

## 2022-07-06 DIAGNOSIS — R918 Other nonspecific abnormal finding of lung field: Secondary | ICD-10-CM

## 2022-07-06 DIAGNOSIS — Z720 Tobacco use: Secondary | ICD-10-CM

## 2022-07-06 DIAGNOSIS — Z510 Encounter for antineoplastic radiation therapy: Secondary | ICD-10-CM | POA: Diagnosis not present

## 2022-07-06 DIAGNOSIS — F17210 Nicotine dependence, cigarettes, uncomplicated: Secondary | ICD-10-CM

## 2022-07-06 LAB — RAD ONC ARIA SESSION SUMMARY
Course Elapsed Days: 30
Plan Fractions Treated to Date: 21
Plan Prescribed Dose Per Fraction: 1.8 Gy
Plan Total Fractions Prescribed: 28
Plan Total Prescribed Dose: 50.4 Gy
Reference Point Dosage Given to Date: 37.8 Gy
Reference Point Session Dosage Given: 1.8 Gy
Session Number: 21

## 2022-07-07 ENCOUNTER — Ambulatory Visit
Admission: RE | Admit: 2022-07-07 | Discharge: 2022-07-07 | Disposition: A | Discharge status: Home / Self Care | Payer: Medicare Other | Source: Ambulatory Visit | Attending: Radiation Oncology | Admitting: Radiation Oncology

## 2022-07-07 ENCOUNTER — Other Ambulatory Visit: Payer: Self-pay

## 2022-07-07 DIAGNOSIS — Z510 Encounter for antineoplastic radiation therapy: Secondary | ICD-10-CM | POA: Diagnosis not present

## 2022-07-07 LAB — RAD ONC ARIA SESSION SUMMARY
Course Elapsed Days: 31
Plan Fractions Treated to Date: 22
Plan Prescribed Dose Per Fraction: 1.8 Gy
Plan Total Fractions Prescribed: 28
Plan Total Prescribed Dose: 50.4 Gy
Reference Point Dosage Given to Date: 39.6 Gy
Reference Point Session Dosage Given: 1.8 Gy
Session Number: 22

## 2022-07-07 NOTE — H&P
Re: Courtney Knox (Jan 22, 1956)MRN: [id number] Provider: Madilyn Hook, MDDate of service: 4/10/2024HPI:Ms. Bail is a 67 y.o. year old female who was referred to the [facility] Clinic on 4/10/202466 year old active smoker (40 PY history) who presented with right chest and arm pain x6 months and was found to have a 5.1 cm mass of the right upper lobe that is invading the chest wall.ECOG Score:2 = Capable of all self-care but unable to carry out any work activities. Up and about >50 percent of waking hours Previous Medical History: Courtney Knox has a past medical history of Colitis, COPD (chronic obstructive pulmonary disease) (HC Code), and Tobacco abuse.Previous Surgical History: She  has no past surgical history on file.Allergies: She is allergic to penicillin.Medications:   alendronate, 70 mg, Oral, Q7 DAYS  buPROPion XL, 150 mg, Oral, QAM (Patient not taking: Reported on 06/14/2022)  nicotine, 1 patch, Transdermal, Daily (Patient not taking: Reported on 06/14/2022)Social History: She reports that she has been smoking cigarettes. She started smoking about 51 years ago. She has a 51.3 pack-year smoking history. She has never used smokeless tobacco. She reports current alcohol use. She reports that she does not use drugs.Family History: Her family history includes Cancer in her maternal aunt; Lung cancer in her father; Lymphoma in her brother.WUJ:WJXBJY of SystemsA comprehensive 12-point review of systems was queried and is otherwise negative.Physical Exam:BP (!) 90/53  - Pulse 90  - Temp 97.1 ?F (36.2 ?C) (Temporal)  - Resp 17  - Wt 46.7 kg  - SpO2 96%  - BMI 17.68 kg/m? Vital signs stable. she is afebrile, room air saturation 96%. Overall well-appearing, well-nourished, no acute distress. Neurologic exam grossly intact. HEENT exam within normal limits. No palpable cervical, supraclavicular, axillary adenopathy. Breath sounds clear bilaterally. No palpable mass on chest wall. No areas of point tenderness. Heart regular rate and rhythm without a murmur. Carotids equal upstroke without bruits. Abdominal exam soft, nontender, no masses. Extremities without clubbing, cyanosis or edema. Skin without rashes or bruising. Additional Data:[place] CHEST WO IV CONTRAST Date: 07/01/2022 6:33 PM INDICATION: Dyspnea, chronic, unclear etiologyopacities of the bilateral upper lobes with traction bronchiectasis and apical pleural thickening/scarring; 40 pack year history. COMPARISON: XR RIBS RIGHT W PA CHEST 2022-06-02; XR CHEST PA AND LATERAL 2014-09-15 TECHNIQUE:  A volumetric [place] acquisition of the chest was obtained from the thoracic inlet to the upper abdomen without administration of intravenous contrast. Coronal and sagittal reformatted images were also provided. FINDINGS: Lungs/Airways/Pleura: Right upper lobe mass measures 51 x 33 mm in maximal axial dimension and 49 mm in craniocaudal dimension measured on the sagittal images (4:08). The mass erodes into the posterior chest wall with adjacent erosion of the right posterior fifth and sixth ribs. 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 There is severe scarring with volume loss and bronchiectasis in the upper lobes extending to the apices associated pleural fluid and pleural thickening and calcification.  The lungs are hyperaerated with severe flattening of the diaphragms and increased AP diameter of the chest.  No pneumothorax.  Mediastinum/Lymph nodes: No enlarged lymph nodes. Heart and Vessels:  Normal heart size.  No pericardial effusion. No thoracic aortic aneurysm.  Mild atherosclerotic calcifications of the coronary arteries. Visualized Upper Abdomen: Severe upper abdominal vascular calcifications. Bones and Soft Tissues: Severe compression deformity at T12. Cachexia. IMPRESSION: Right upper lobe mass with chest wall invasion. [facility] Radiology Notify System Classification: New malignancy.   Pulmonary Function Test:  FEV1=37%  DLCO=20%  Assessment and Plan:  Lung mass  (primary encounter diagnosis)  67 year old woman, 40 pack year smoking history, with mass of the right lung invading the chest wall suspicious for lung cancer.We discussed the additional work up needed at this point. We also discussed that she is not a good surgical candidate for lung resection given her poor lung function. I will refer her to medical oncology and radiation oncology for the rest of her treatment and work up. - PET scan and brain MRI to look for metastatic disease - referral for EBUS with biopsy of lymph nodes and right side mass - if unable to get tissue via EBUS then [place] guided biopsy - referral to medical oncology and radiation oncologyGavitt A. Clydene Pugh, MDYale [facility] HospitalDivision of Thoracic Surgery4/01/2023 8:33 AM

## 2022-07-08 ENCOUNTER — Inpatient Hospital Stay: Payer: Medicare Other

## 2022-07-08 ENCOUNTER — Other Ambulatory Visit: Payer: Self-pay

## 2022-07-08 ENCOUNTER — Ambulatory Visit
Admission: RE | Admit: 2022-07-08 | Discharge: 2022-07-08 | Disposition: A | Discharge status: Home / Self Care | Payer: Medicare Other | Source: Ambulatory Visit | Attending: Radiation Oncology | Admitting: Radiation Oncology

## 2022-07-08 ENCOUNTER — Other Ambulatory Visit: Payer: Self-pay | Admitting: *Deleted

## 2022-07-08 ENCOUNTER — Encounter: Admit: 2022-07-08 | Payer: PRIVATE HEALTH INSURANCE | Primary: Internal Medicine

## 2022-07-08 ENCOUNTER — Encounter: Admit: 2022-07-08 | Payer: PRIVATE HEALTH INSURANCE | Attending: Surgery | Primary: Internal Medicine

## 2022-07-08 ENCOUNTER — Encounter
Admit: 2022-07-08 | Payer: PRIVATE HEALTH INSURANCE | Attending: Student in an Organized Health Care Education/Training Program | Primary: Internal Medicine

## 2022-07-08 ENCOUNTER — Telehealth: Admit: 2022-07-08 | Payer: PRIVATE HEALTH INSURANCE | Attending: Internal Medicine | Primary: Internal Medicine

## 2022-07-08 ENCOUNTER — Ambulatory Visit
Admit: 2022-07-08 | Payer: BLUE CROSS/BLUE SHIELD | Attending: Student in an Organized Health Care Education/Training Program | Primary: Internal Medicine

## 2022-07-08 ENCOUNTER — Inpatient Hospital Stay: Admit: 2022-07-08 | Discharge: 2022-07-08 | Payer: BLUE CROSS/BLUE SHIELD | Primary: Internal Medicine

## 2022-07-08 VITALS — BP 113/80 | HR 99 | Temp 98.1°F | Resp 17

## 2022-07-08 DIAGNOSIS — M546 Pain in thoracic spine: Secondary | ICD-10-CM

## 2022-07-08 DIAGNOSIS — R918 Other nonspecific abnormal finding of lung field: Secondary | ICD-10-CM

## 2022-07-08 DIAGNOSIS — Z720 Tobacco use: Secondary | ICD-10-CM

## 2022-07-08 DIAGNOSIS — K529 Noninfective gastroenteritis and colitis, unspecified: Secondary | ICD-10-CM

## 2022-07-08 DIAGNOSIS — S22080S Wedge compression fracture of T11-T12 vertebra, sequela: Secondary | ICD-10-CM

## 2022-07-08 DIAGNOSIS — J449 Chronic obstructive pulmonary disease, unspecified: Secondary | ICD-10-CM

## 2022-07-08 DIAGNOSIS — M810 Age-related osteoporosis without current pathological fracture: Secondary | ICD-10-CM

## 2022-07-08 DIAGNOSIS — Z510 Encounter for antineoplastic radiation therapy: Secondary | ICD-10-CM | POA: Diagnosis not present

## 2022-07-08 DIAGNOSIS — C210 Malignant neoplasm of anus, unspecified: Secondary | ICD-10-CM

## 2022-07-08 LAB — RAD ONC ARIA SESSION SUMMARY
Course Elapsed Days: 32
Plan Fractions Treated to Date: 23
Plan Prescribed Dose Per Fraction: 1.8 Gy
Plan Total Fractions Prescribed: 28
Plan Total Prescribed Dose: 50.4 Gy
Reference Point Dosage Given to Date: 41.4 Gy
Reference Point Session Dosage Given: 1.8 Gy
Session Number: 23

## 2022-07-08 MED ORDER — SODIUM CHLORIDE 0.9% FLUSH: 10.0000 mL | Freq: Once | INTRAVENOUS | Status: DC

## 2022-07-08 MED ORDER — HEPARIN SOD (PORK) LOCK FLUSH 100 UNIT/ML IV SOLN: 250.0000 [IU] | Freq: Once | INTRAVENOUS | Status: DC | PRN

## 2022-07-08 MED ORDER — SODIUM CHLORIDE 0.9% FLUSH
10.0000 mL | INTRAVENOUS | Status: DC | PRN
  Administered 2022-07-08: 10 mL

## 2022-07-08 MED ORDER — HEPARIN SOD (PORK) LOCK FLUSH 100 UNIT/ML IV SOLN: 500.0000 [IU] | Freq: Once | INTRAVENOUS | Status: DC

## 2022-07-08 NOTE — Progress Notes (Signed)
PICC Removal Note: S: Pt here for PICC Line removal post Infusion  O: PICC line removed from Right antecubital after sterile site prepped per protocol. PICC catheter tip visualized and intact. Pressure dressing applied with transpore tape. A: No redness, ecchymosis, edema, swelling, noted. After 30 minute wait blood was noted on the dressing. Tape was removed and new gauze was placed and retaped pressure held again.  No redness, ecchymosis, edema, swelling, noted.Patient observed for an additional 20 minutes. Dressing clean dry and intact. VSS at discharge. P: Instructions provided on post PICC discharge care, including followup notification instructions.

## 2022-07-08 NOTE — Patient Instructions (Signed)
PICC Removal, Adult, Care After The following information offers guidance on how to care for yourself after your procedure. Your health care provider may also give you more specific instructions. If you have problems or questions, contact your health care provider. What can I expect after the procedure? After the procedure, it is common to have: Tenderness or soreness. Redness, swelling, or a scab at the place where your PICC was removed (exit site). Follow these instructions at home: For the first 24 hours after the procedure: Keep the bandage (dressing) on your exit site clean and dry. Do not remove your dressing until your health care provider tells you to do so. Do not lift anything heavy or do activities that require great effort until your health care provider says it is okay. You should avoid: Lifting weights. Doing yard work. Doing any physical activity with repetitive arm movement. Watch closely for any signs of an air bubble in the vein (air embolism). This is a rare but serious complication. Signs of an air embolism include trouble breathing, wheezing, chest pain, or a fast pulse. If you have signs of an air embolism, call 911 right away and lie down on your left side to keep the air from moving into your lungs. After 24 hours have passed:  Remove your dressing as told by your health care provider. Wash your hands with soap and water for at least 20 seconds before and after you change your dressing. If soap and water are not available, use hand sanitizer. Return to your normal activities as told by your health care provider. A small scab may develop over the exit site. Do not pick at the scab. When bathing or showering, gently wash the exit site with soap and water. Pat it dry. Watch for signs of infection, such as: A fever or chills. Swollen glands under your arm. More redness, swelling, or soreness around your arm. Blood, fluid, or pus coming from your exit site. Warmth or a  bad smell coming from your exit site. A red streak spreading away from your exit site. General instructions Take over-the-counter and prescription medicines only as told by your health care provider. Do not take any new medicines without checking with your health care provider first. If you were given an antibiotic ointment, apply it as told by your health care provider. Keep all follow-up visits. This is important. Contact a health care provider if: You have a fever or chills. You have swelling at your exit site or swollen glands under your arm. You have signs of infection at your exit site. You have soreness, redness, or swelling in your arm that gets worse. Get help right away if: You have numbness or tingling in your fingers, hand, or arm. Your arm looks blue and feels cold. You have signs of an air embolism, such as trouble breathing, wheezing, chest pain, or a fast pulse. These symptoms may be an emergency. Get medical help right away. Call 911. Do not wait to see if the symptoms will go away. Do not drive yourself to the hospital. Summary After a PICC is removed, it is common to have tenderness or soreness, redness, swelling, or a scab at the exit site. Keep the bandage (dressing) over the exit site clean and dry. Do not remove the dressing until your health care provider tells you to do so. Do not lift anything heavy or do activities that require great effort until your health care provider says it is okay. Watch closely for any signs   of an air bubble (air embolism). If you have signs of an air embolism, call 911 right away and lie down on your left side. This information is not intended to replace advice given to you by your health care provider. Make sure you discuss any questions you have with your health care provider. Document Revised: 09/30/2020 Document Reviewed: 09/30/2020 Elsevier Patient Education  2023 Elsevier Inc.  

## 2022-07-08 NOTE — Progress Notes (Signed)
Order to remove PICC line today in PAT orders.

## 2022-07-08 NOTE — Progress Notes
Knox name: 	 Courtney Knox MR:	 [id number] Date of birth:	 06-30-1957Date of visit:	 4/12/2024Provider:	 Zayin Valadez Chales Abrahams, MDHistory of present illness:Courtney Knox is a 67 y.o. female who presents today with a chief complaint of T12 fracture (closed, subsequent encounter)When suffered fracture: 2008 when she fell from a ladderNoted to have right lung mass on [place] [facility], for which she saw Thoracic Surgery and has been given referral to Medical Oncology and RadOnc, and plan for EBUS biopsyOsteoporosis treatments: Calcium and D3, previously tried Fosamax but did not tolerate.  Courtney Knox, with a history of thoracic spine fracture in 2008, presents with persistent back pain. Courtney pain, initially localized to Courtney right rib area, has since radiated to Courtney mid to lower thoracic spine and around Courtney right side of Courtney torso. Courtney Knox denies any pain radiating to Courtney front of Courtney body. Courtney pain is aggravated by certain activities, particularly those involving Courtney use of their fingers. Courtney Knox also reports numbness in Courtney toes and fingers, but denies any weakness in Courtney legs or any loss of sensation in Courtney thighs, genital area, or rectal area.Courtney Knox has been managing Courtney pain with naproxen and has not sought any other treatments for Courtney back pain. They have a history of physical therapy for Courtney back pain, but this was several years ago. Courtney Knox also reports taking calcium and vitamin D supplements for osteoporosis, as advised by their primary care provider.Courtney Knox also mentions a history of colitis and recent Mohs surgery for a skin condition. They were prescribed OxyContin for post-operative pain, of which they took a few doses. Courtney Knox also reports a weight restriction of 25 pounds due to their back condition.   Past medical history:  has a past medical history of Colitis, COPD (chronic obstructive pulmonary disease) (HC Code), and Tobacco abuse. Also right upper lobe lung mass and osteoporosisPast surgical history: left wrist surgeryFamily history: family history includes Cancer in her maternal aunt; Lung cancer in her father; Lymphoma in her brother.Social history: Social History Socioeconomic History  Marital status: Married Tobacco Use  Smoking status: Every Day   Current packs/day: 1.00   Average packs/day: 1 pack/day for 51.3 years (51.3 ttl pk-yrs)   Types: Cigarettes   Start date: 1973  Smokeless tobacco: Never Substance and Sexual Activity  Alcohol use: Yes  Drug use: Never  Allergies: PenicillinMedications: has a current medication list which includes Courtney following prescription(s): Calcium + Vit D, bupropion xl, and nicotine.Review of Systems:Constitutional: Negative for chills, fatigue, fever and unexpected weight change. HENT: Negative for hearing loss, tinnitus, trouble swallowing and voice change.  Eyes: Negative for photophobia and visual disturbance. Respiratory: Negative for cough and shortness of breath.  Cardiovascular: Negative for chest pain and leg swelling. Gastrointestinal: Negative for nausea and vomiting. Endocrine: Negative for cold intolerance and heat intolerance. Musculoskeletal: Positive for back pain and neck pain as above. Negative for joint pain, stiffness, or joint swelling.Skin: Negative for wound, rash or other lesions.Allergic/Immunologic: Negative for immunocompromised state.Neurological: Positive for feet numbness and fingers pain as above. Negative for dizziness, tremors, seizures, syncope, facial asymmetry, speech difficulty, light-headedness and headaches. Hematological: Does not bruise/bleed easily. Psychiatric/Behavioral: Negative for confusion, sleep disturbance or anxiety.Exam: Courtney Knox vital signs are: BP 105/67  - Pulse 84  - SpO2 96% General: alert, oriented, no acute distressHEENT: NCATChest: Normal respiratory effort, no distressSkin: no obvious lesions or rashesAbdomen: Non-tender non-distended abdomenMSK/SPINENo evidence of prior surgical incisionsTenderness to palpation: none over spineStands unassisted with appropriate sagittal and coronal balance, slightly accentuated thoracic  kyphosisPain with cervical flexion/extension, lateral bending, rotation: nonePain with thoracolumbar flexion/extension, lateral bending, rotation: noneNEURO:CN: PERRL, EOMI, FS, SSS, TML. No tongue fasciculationsTone: WNL x4Strength: 5/5 x4Atrophy: none obviousSensation to light touch: grossly intact x4Reflexes: 2+ through bilat bicep, BR, tri, patella & achillesProprioception: grossly intact in bilat great toes & thumbsStraight-leg raise: neg bilatSpurling: neg bilatTinel: neg bilat wrists & cubital tunnels & lat fem headsGait: grossly WNL casual, tandem, heel & toeNegative Hoffman'sNegative Romberg'sNo evidence of clonusLhermitte: negRapid finger tap: WNL bilatNo dysdiadochokinesiaImaging interpreted by me at today's visit:*Entire spine standing scoli AP/Lat x-rays 07/08/2022: T12 fracture grossly stable from prior. Otherwise grossly preserved global sag and cor alignment. Diffuse bone density loss*Thoracic MRI without contrast 06/18/22: chronic-appearing T12 burst fracture with minimal retropulsion, canal patent, no obvious posterior elements or ligamentous involvement*Chest Alsey noncon 07/01/22: right upper lobe mass 5.1cm. T12 chronic burst fracture with minimal retropulsion or extension into posterior elementsStudies interpreted previously or separately:*DEXA 06/06/22: lowest T-score -4.1 at L fem neckImpression and plan:Courtney Knox is a pleasant 67 y.o. female with 40 pack-year active smoking and new right lung mass:#T12 burst fracture: chronic, no indication for brace, vertebroplasty or surgery. Likely traumatic + osteoporotic in nature, but given smoking history and right lung mass, must rule out pathologic fracture#Osteoporosis: only on Ca and Vit D3#Right lung mass: Thoracic advised against surgery due to low lung volumes from emphysema, referred to Oncology and RadOnc. EBUS biopsy pending#Tobacco dependence: identified increased risk factors from my H&P based on Knox being a longstanding active smoker. I discussed Courtney benefits of quitting smoking and provided resources for smoking cessation programs. I encouraged Courtney Knox to set a quit date and provided support through Courtney process. Courtney Knox elected to defer this for now. I have discussed Courtney pathophysiology, natural history, return precautions and treatment options extensively at today's visit.-Continue follow-up with Thoracic, Oncology & RadOnc. Follow up right lung mass biopsy-Endocrine referral placed-PT referral placed-MRI entire spine w/wo contrast ordered. Also has brain MRI coming up-RTC after MRI---Electronically signed ZO:XWRUE Manual Meier.D.Attending/Spine Surgeon, Neurological SurgeryAssistant Professor, [employer] of [employer]---I provided a concise overview of Courtney ambient note generation solution. [name] Kincy or their legally authorized representative verbally consented to an audio recording of their visit to assist with Courtney completion of Courtney visit documentation. They understand that Courtney recording will not be retained after Courtney visit is summarized. This note was completed using an AI-powered solution and reviewed by Amado Coe, MD for accuracy.

## 2022-07-08 NOTE — Other
4/12 1:55 PM Per order biopsy required of Right upper lobe [place] 07/01/22 (4:08) to obtain tissue confirmation. OK for [place]-guided biopsy of right upper lobe mass. [facility]. Case reviewed and approved by Dr. Dow Adolph- Homonoff. Diamantina Providence, RN

## 2022-07-08 NOTE — Telephone Encounter
Haywood Pao sent to [employer] Nurse Pool; [facility] Clinical Secretary PoolHello,The patient's biopsy is scheduled for April 23 at 8:30 am arrival time at [facility] 2nd floor. Please note that the patient should not eat or drink anything (NPO) for 6 hours before the appointment. Additionally, the patient will need someone to drive him back home after the appointment. Please inform the patient about the scheduled appointment. One of our nurses will contact pt a day prior to the procedure in order to discuss the instructions for the procedure as well. Please let my know if there are any changes or if you require any further information. Thank you.

## 2022-07-09 ENCOUNTER — Inpatient Hospital Stay: Admit: 2022-07-09 | Discharge: 2022-07-09 | Payer: BLUE CROSS/BLUE SHIELD | Primary: Internal Medicine

## 2022-07-09 DIAGNOSIS — R918 Other nonspecific abnormal finding of lung field: Secondary | ICD-10-CM

## 2022-07-09 MED ORDER — GADOTERATE MEGLUMINE 0.5 MMOL/ML (376.9 MG/ML) INTRAVENOUS SOLUTION
0.5376.9 mmol/mL (376.9 mg/mL) | Freq: Once | INTRAVENOUS | Status: CP | PRN
Start: 2022-07-09 — End: ?
  Administered 2022-07-09: 15:00:00 0.5 mL via INTRAVENOUS

## 2022-07-11 ENCOUNTER — Other Ambulatory Visit: Payer: Self-pay | Admitting: Radiation Oncology

## 2022-07-11 ENCOUNTER — Inpatient Hospital Stay: Payer: Medicare Other

## 2022-07-11 ENCOUNTER — Other Ambulatory Visit: Payer: Medicare Other

## 2022-07-11 ENCOUNTER — Telehealth: Payer: Self-pay | Admitting: *Deleted

## 2022-07-11 ENCOUNTER — Ambulatory Visit: Payer: Medicare Other

## 2022-07-11 ENCOUNTER — Other Ambulatory Visit (HOSPITAL_BASED_OUTPATIENT_CLINIC_OR_DEPARTMENT_OTHER): Payer: Self-pay

## 2022-07-11 MED ORDER — LORAZEPAM 0.5 MG PO TABS
ORAL_TABLET | ORAL | 0 refills | Status: DC
  Filled 2022-07-11: qty 60, 15d supply, fill #0

## 2022-07-11 MED ORDER — DIPHENOXYLATE-ATROPINE 2.5-0.025 MG PO TABS
1.0000 | ORAL_TABLET | Freq: Four times a day (QID) | ORAL | 1 refills | Status: DC | PRN
  Filled 2022-07-11: qty 30, 8d supply, fill #0
  Filled 2022-11-03: qty 30, 8d supply, fill #1

## 2022-07-11 NOTE — Progress Notes (Signed)
The patient is currently receiving chemoradiation for anal cancer and is close to finishing her therapy.  She is now having more fecal urgency as well as incontinence, significant dermatitis of her skin in the treatment field, and nausea despite Compazine and Zofran.  She does have a prescription for Xanax that she uses at nighttime as needed for sleep.  She has been using over-the-counter Imodium regularly without relief.  Nursing has triaged her phone call and after speaking with Latoya, our plan will be to discontinue Imodium and switch to Lomotil for frequency of bowel movements, we can add steroid suppositories later this week if needed however given the patient's skin irritation would prefer to hold off on this for now.  She can continue scheduled Zofran every 8 hours, Compazine as needed, and Ativan in excess of this as well.  She needs to have someone to drive her to treatment if she is taking this regularly.  She also is aware that she would need to hold Xanax if she has taken Ativan within 4 hours.

## 2022-07-11 NOTE — Telephone Encounter (Signed)
Received a call from the patient with complaints of severe diarrhea and nausea.  She reports she is taking zofran/compazine, and imodium as prescribed with little to no relief.  She reports she has been hydrating well but it seems to come right out.  Spoke with the PA and a prescription was sent in to [facility] for Ativan and lomotil.  Patient was advised to refrain from taking Ativan and Xanax together, to separate the two by at least 4 hours.  She verbalized understanding.  Coralyn Helling. Vickii Chafe, BSN

## 2022-07-12 ENCOUNTER — Inpatient Hospital Stay: Payer: Medicare Other

## 2022-07-12 ENCOUNTER — Ambulatory Visit
Admission: RE | Admit: 2022-07-12 | Discharge: 2022-07-12 | Disposition: A | Discharge status: Home / Self Care | Payer: Medicare Other | Source: Ambulatory Visit | Attending: Radiation Oncology | Admitting: Radiation Oncology

## 2022-07-12 ENCOUNTER — Other Ambulatory Visit: Payer: Self-pay

## 2022-07-12 ENCOUNTER — Ambulatory Visit: Admit: 2022-07-12 | Payer: BLUE CROSS/BLUE SHIELD | Primary: Internal Medicine

## 2022-07-12 DIAGNOSIS — C210 Malignant neoplasm of anus, unspecified: Secondary | ICD-10-CM

## 2022-07-12 DIAGNOSIS — Z510 Encounter for antineoplastic radiation therapy: Secondary | ICD-10-CM | POA: Diagnosis not present

## 2022-07-12 LAB — RAD ONC ARIA SESSION SUMMARY
Course Elapsed Days: 36
Plan Fractions Treated to Date: 24
Plan Prescribed Dose Per Fraction: 1.8 Gy
Plan Total Fractions Prescribed: 28
Plan Total Prescribed Dose: 50.4 Gy
Reference Point Dosage Given to Date: 43.2 Gy
Reference Point Session Dosage Given: 1.8 Gy
Session Number: 24

## 2022-07-12 LAB — CBC WITH DIFFERENTIAL (CANCER CENTER ONLY)
Abs Immature Granulocytes: 0.02 10*3/uL (ref 0.00–0.07)
Basophils Absolute: 0 10*3/uL (ref 0.0–0.1)
Basophils Relative: 0 %
Eosinophils Absolute: 0.1 10*3/uL (ref 0.0–0.5)
Eosinophils Relative: 4 %
HCT: 31.6 % — ABNORMAL LOW (ref 36.0–46.0)
Hemoglobin: 10.5 g/dL — ABNORMAL LOW (ref 12.0–15.0)
Immature Granulocytes: 1 %
Lymphocytes Relative: 5 %
Lymphs Abs: 0.2 10*3/uL — ABNORMAL LOW (ref 0.7–4.0)
MCH: 30.4 pg (ref 26.0–34.0)
MCHC: 33.2 g/dL (ref 30.0–36.0)
MCV: 91.6 fL (ref 80.0–100.0)
Monocytes Absolute: 0.2 10*3/uL (ref 0.1–1.0)
Monocytes Relative: 6 %
Neutro Abs: 2.6 10*3/uL (ref 1.7–7.7)
Neutrophils Relative %: 84 %
Platelet Count: 183 10*3/uL (ref 150–400)
RBC: 3.45 MIL/uL — ABNORMAL LOW (ref 3.87–5.11)
RDW: 14.2 % (ref 11.5–15.5)
WBC Count: 3.1 10*3/uL — ABNORMAL LOW (ref 4.0–10.5)
nRBC: 0 % (ref 0.0–0.2)

## 2022-07-12 LAB — BASIC METABOLIC PANEL - CANCER CENTER ONLY
Anion gap: 8 (ref 5–15)
BUN: 10 mg/dL (ref 8–23)
CO2: 28 mmol/L (ref 22–32)
Calcium: 9 mg/dL (ref 8.9–10.3)
Chloride: 104 mmol/L (ref 98–111)
Creatinine: 0.62 mg/dL (ref 0.44–1.00)
GFR, Estimated: >60 mL/min (ref >60)
Glucose, Bld: 103 mg/dL — ABNORMAL HIGH (ref 70–99)
Potassium: 3.7 mmol/L (ref 3.5–5.1)
Sodium: 140 mmol/L (ref 135–145)

## 2022-07-13 ENCOUNTER — Ambulatory Visit
Admission: RE | Admit: 2022-07-13 | Discharge: 2022-07-13 | Disposition: A | Discharge status: Home / Self Care | Payer: Medicare Other | Source: Ambulatory Visit | Attending: Radiation Oncology | Admitting: Radiation Oncology

## 2022-07-13 ENCOUNTER — Ambulatory Visit: Payer: Medicare Other

## 2022-07-13 ENCOUNTER — Other Ambulatory Visit: Payer: Self-pay

## 2022-07-13 DIAGNOSIS — Z510 Encounter for antineoplastic radiation therapy: Secondary | ICD-10-CM | POA: Diagnosis not present

## 2022-07-13 LAB — RAD ONC ARIA SESSION SUMMARY
Course Elapsed Days: 37
Plan Fractions Treated to Date: 25
Plan Prescribed Dose Per Fraction: 1.8 Gy
Plan Total Fractions Prescribed: 28
Plan Total Prescribed Dose: 50.4 Gy
Reference Point Dosage Given to Date: 45 Gy
Reference Point Session Dosage Given: 1.8 Gy
Session Number: 25

## 2022-07-14 ENCOUNTER — Ambulatory Visit: Payer: Medicare Other

## 2022-07-14 ENCOUNTER — Telehealth: Payer: Self-pay

## 2022-07-14 ENCOUNTER — Ambulatory Visit
Admission: RE | Admit: 2022-07-14 | Discharge: 2022-07-14 | Disposition: A | Discharge status: Home / Self Care | Payer: Medicare Other | Source: Ambulatory Visit | Attending: Radiation Oncology | Admitting: Radiation Oncology

## 2022-07-14 ENCOUNTER — Other Ambulatory Visit: Payer: Self-pay

## 2022-07-14 DIAGNOSIS — Z510 Encounter for antineoplastic radiation therapy: Secondary | ICD-10-CM | POA: Diagnosis not present

## 2022-07-14 LAB — RAD ONC ARIA SESSION SUMMARY
Course Elapsed Days: 38
Plan Fractions Treated to Date: 26
Plan Prescribed Dose Per Fraction: 1.8 Gy
Plan Total Fractions Prescribed: 28
Plan Total Prescribed Dose: 50.4 Gy
Reference Point Dosage Given to Date: 46.8 Gy
Reference Point Session Dosage Given: 1.8 Gy
Session Number: 26

## 2022-07-14 NOTE — Telephone Encounter (Addendum)
A patient called to report a sensitive skin issue located in the area between her buttocks and is inquiring about the suitability of using Neosporin for treatment. Per Dr. Truett Perna he recommend that the patient consider using Desitin for their situation. The patient demonstrated comprehension and did not express any additional inquiries or issues at this moment.

## 2022-07-15 ENCOUNTER — Encounter: Payer: Self-pay | Admitting: Radiation Oncology

## 2022-07-15 ENCOUNTER — Ambulatory Visit: Payer: Medicare Other

## 2022-07-15 ENCOUNTER — Ambulatory Visit
Admission: RE | Admit: 2022-07-15 | Discharge: 2022-07-15 | Disposition: A | Discharge status: Home / Self Care | Payer: Medicare Other | Source: Ambulatory Visit | Attending: Radiation Oncology | Admitting: Radiation Oncology

## 2022-07-15 ENCOUNTER — Other Ambulatory Visit: Payer: Self-pay

## 2022-07-15 DIAGNOSIS — Z510 Encounter for antineoplastic radiation therapy: Secondary | ICD-10-CM | POA: Diagnosis not present

## 2022-07-15 LAB — RAD ONC ARIA SESSION SUMMARY
Course Elapsed Days: 39
Plan Fractions Treated to Date: 27
Plan Prescribed Dose Per Fraction: 1.8 Gy
Plan Total Fractions Prescribed: 28
Plan Total Prescribed Dose: 50.4 Gy
Reference Point Dosage Given to Date: 48.6 Gy
Reference Point Session Dosage Given: 1.8 Gy
Session Number: 27

## 2022-07-16 ENCOUNTER — Inpatient Hospital Stay: Admit: 2022-07-16 | Discharge: 2022-07-16 | Payer: BLUE CROSS/BLUE SHIELD | Primary: Internal Medicine

## 2022-07-16 ENCOUNTER — Encounter: Admit: 2022-07-16 | Payer: PRIVATE HEALTH INSURANCE | Primary: Internal Medicine

## 2022-07-16 DIAGNOSIS — R918 Other nonspecific abnormal finding of lung field: Secondary | ICD-10-CM

## 2022-07-16 MED ORDER — FLUDEOXYGLUCOSE P/DOSE (FDG) INJECTION
Freq: Once | INTRAVENOUS | Status: DC | PRN
Start: 2022-07-16 — End: 2022-07-21

## 2022-07-16 MED ORDER — IOHEXOL 350 MG IODINE/ML ORAL
350 mg/mL | Freq: Once | ORAL | Status: DC
Start: 2022-07-16 — End: 2022-07-21

## 2022-07-18 ENCOUNTER — Encounter: Payer: Self-pay | Admitting: Nurse Practitioner

## 2022-07-18 ENCOUNTER — Inpatient Hospital Stay: Payer: Medicare Other | Admitting: Nurse Practitioner

## 2022-07-18 ENCOUNTER — Ambulatory Visit: Payer: Medicare Other

## 2022-07-18 ENCOUNTER — Inpatient Hospital Stay: Payer: Medicare Other

## 2022-07-18 VITALS — BP 102/71 | HR 96 | Temp 98.1°F | Resp 18 | Ht 67.0 in | Wt 171.8 lb

## 2022-07-18 DIAGNOSIS — C210 Malignant neoplasm of anus, unspecified: Secondary | ICD-10-CM

## 2022-07-18 DIAGNOSIS — Z510 Encounter for antineoplastic radiation therapy: Secondary | ICD-10-CM | POA: Diagnosis not present

## 2022-07-18 LAB — CMP (CANCER CENTER ONLY)
ALT: 8 U/L (ref 0–44)
AST: 12 U/L — ABNORMAL LOW (ref 15–41)
Albumin: 4 g/dL (ref 3.5–5.0)
Alkaline Phosphatase: 46 U/L (ref 38–126)
Anion gap: 10 (ref 5–15)
BUN: 9 mg/dL (ref 8–23)
CO2: 27 mmol/L (ref 22–32)
Calcium: 9 mg/dL (ref 8.9–10.3)
Chloride: 105 mmol/L (ref 98–111)
Creatinine: 0.63 mg/dL (ref 0.44–1.00)
GFR, Estimated: >60 mL/min (ref >60)
Glucose, Bld: 94 mg/dL (ref 70–99)
Potassium: 4.3 mmol/L (ref 3.5–5.1)
Sodium: 142 mmol/L (ref 135–145)
Total Bilirubin: 0.7 mg/dL (ref 0.3–1.2)
Total Protein: 6.6 g/dL (ref 6.5–8.1)

## 2022-07-18 LAB — CBC WITH DIFFERENTIAL (CANCER CENTER ONLY)
Abs Immature Granulocytes: 0.01 10*3/uL (ref 0.00–0.07)
Basophils Absolute: 0 10*3/uL (ref 0.0–0.1)
Basophils Relative: 0 %
Eosinophils Absolute: 0.1 10*3/uL (ref 0.0–0.5)
Eosinophils Relative: 3 %
HCT: 33.2 % — ABNORMAL LOW (ref 36.0–46.0)
Hemoglobin: 11.2 g/dL — ABNORMAL LOW (ref 12.0–15.0)
Immature Granulocytes: 0 %
Lymphocytes Relative: 8 %
Lymphs Abs: 0.2 10*3/uL — ABNORMAL LOW (ref 0.7–4.0)
MCH: 31.1 pg (ref 26.0–34.0)
MCHC: 33.7 g/dL (ref 30.0–36.0)
MCV: 92.2 fL (ref 80.0–100.0)
Monocytes Absolute: 0.5 10*3/uL (ref 0.1–1.0)
Monocytes Relative: 18 %
Neutro Abs: 1.8 10*3/uL (ref 1.7–7.7)
Neutrophils Relative %: 71 %
Platelet Count: 182 10*3/uL (ref 150–400)
RBC: 3.6 MIL/uL — ABNORMAL LOW (ref 3.87–5.11)
RDW: 15.4 % (ref 11.5–15.5)
WBC Count: 2.6 10*3/uL — ABNORMAL LOW (ref 4.0–10.5)
nRBC: 0 % (ref 0.0–0.2)

## 2022-07-18 NOTE — Progress Notes (Signed)
  [facility] OFFICE PROGRESS NOTE   Diagnosis: Anal cancer  INTERVAL HISTORY:   Alyssa Deleon returns as scheduled.  She completed cycle two 5-FU/Mitomycin-C beginning 07/04/2022.  5-FU was dose reduced due to persistent diarrhea.  She denies nausea/vomiting.  No mouth sores.  She estimates 5 small loose stools a day.  Poor energy, decreased appetite.  Fluid intake is good.    Objective:  Vital signs in last 24 hours:  Blood pressure 102/71, pulse 96, temperature 98.1 F (36.7 C), temperature source Tympanic, resp. rate 18, height  (1.702 m), weight 171 lb 12.8 oz (77.9 kg), SpO2 98 %.    HEENT: No thrush or ulcers. Resp: Lungs clear bilaterally. Cardio: Regular rate and rhythm. GI: No hepatosplenomegaly. Vascular: No leg edema. Skin: Palms without erythema.  Erythema at the groin; superficial breakdown at the gluteal fold and perianal skin.   Lab Results:  Lab Results  Component Value Date   WBC 2.6 (L) 07/18/2022   HGB 11.2 (L) 07/18/2022   HCT 33.2 (L) 07/18/2022   MCV 92.2 07/18/2022   PLT 182 07/18/2022   NEUTROABS 1.8 07/18/2022    Imaging:  No results found.  Medications: I have reviewed the patient's current medications.  Assessment/Plan: Anal cancer Anal mass noted on digital exam and colonoscopy 05/09/2022-biopsy consistent with squamous cell carcinoma CTs 05/12/2022-no evidence of thoracic metastatic disease, calcified right pleural plaques, asymmetric thickening of the right posterior wall of the distal rectum/anal canal, no evidence of metastatic adenopathy in the abdomen or pelvis, 2 small hypodense left liver lesions favored to represent benign cysts PET scan 05/30/2022-intense hypermetabolic activity at the anus.  No evidence of metastatic adenopathy.  Indeterminate small, mildly hypermetabolic lesion in the lateral segment of the left hepatic lobe. Radiation 06/06/2022  Cycle one 5-FU/Mitomycin-C 06/06/2022 cycle two 5-FU/Mitomycin-C  07/04/2022 (5-FU dose reduced secondary to diarrhea) MRI liver 06/15/2022-1 cm hypervascular mass in segment 2 of the left hepatic lobe, corresponds to the focus of hypermetabolism on recent PET-CT.  Imaging characteristics atypical for metastatic squamous cell carcinoma.  Follow-up MRI in 3 months Cycle two 5-FU/Mitomycin-C 07/04/2022 (5-FU dose reduced secondary to diarrhea Depression Left breast cancer 1995 Recurrent left breast cancer 2003?  Status post a left mastectomy and TRAM Family history of multiple cancers she reports being negative for a BRCA mutation  Disposition: Alyssa Deleon appears stable.  She completed cycle two 5-FU/Mitomycin-C beginning 07/04/2022.  Overall tolerated well.  She will complete the course of radiation later today.  CBC reviewed.  ANC and platelets in adequate range.  She will return for lab and follow-up in approximately 4 weeks.  She will contact the office in the interim with any problems.  We discussed focusing on nutrition and activity as she completes treatment.  Lonna Cobb ANP/GNP-BC   07/18/2022  9:15 AM

## 2022-07-19 ENCOUNTER — Ambulatory Visit: Payer: Medicare Other

## 2022-07-19 ENCOUNTER — Other Ambulatory Visit: Payer: Self-pay

## 2022-07-19 ENCOUNTER — Ambulatory Visit
Admission: RE | Admit: 2022-07-19 | Discharge: 2022-07-19 | Disposition: A | Discharge status: Home / Self Care | Payer: Medicare Other | Source: Ambulatory Visit | Attending: Radiation Oncology | Admitting: Radiation Oncology

## 2022-07-19 ENCOUNTER — Inpatient Hospital Stay
Admit: 2022-07-19 | Discharge: 2022-07-19 | Payer: BLUE CROSS/BLUE SHIELD | Attending: Diagnostic Radiology | Primary: Internal Medicine

## 2022-07-19 ENCOUNTER — Encounter: Admit: 2022-07-19 | Payer: PRIVATE HEALTH INSURANCE | Attending: Diagnostic Radiology | Primary: Internal Medicine

## 2022-07-19 ENCOUNTER — Telehealth: Admit: 2022-07-19 | Payer: PRIVATE HEALTH INSURANCE | Attending: Surgery | Primary: Internal Medicine

## 2022-07-19 DIAGNOSIS — K529 Noninfective gastroenteritis and colitis, unspecified: Secondary | ICD-10-CM

## 2022-07-19 DIAGNOSIS — Z720 Tobacco use: Secondary | ICD-10-CM

## 2022-07-19 DIAGNOSIS — J449 Chronic obstructive pulmonary disease, unspecified: Secondary | ICD-10-CM

## 2022-07-19 DIAGNOSIS — Z510 Encounter for antineoplastic radiation therapy: Secondary | ICD-10-CM | POA: Diagnosis not present

## 2022-07-19 LAB — RAD ONC ARIA SESSION SUMMARY
Course Elapsed Days: 43
Plan Fractions Treated to Date: 28
Plan Prescribed Dose Per Fraction: 1.8 Gy
Plan Total Fractions Prescribed: 28
Plan Total Prescribed Dose: 50.4 Gy
Reference Point Dosage Given to Date: 50.4 Gy
Reference Point Session Dosage Given: 1.8 Gy
Session Number: 28

## 2022-07-19 MED ORDER — MIDAZOLAM 1 MG/ML INJECTION SOLUTION
1 mg/mL | Status: CP
Start: 2022-07-19 — End: ?

## 2022-07-19 MED ORDER — FENTANYL (PF) 50 MCG/ML INJECTION SOLUTION
50 mcg/mL | Status: CP
Start: 2022-07-19 — End: ?

## 2022-07-19 NOTE — Radiation Completion Notes (Addendum)
  Radiation Oncology         [phone] ________________________________  Name: RONEKA GILPIN MRN: [id number]  Date of Service: 07/19/2022  DOB: [birth year]  End of Treatment Note  Diagnosis:  Squamous Cell Carcinoma of the Anus      ==========DELIVERED PLANS==========  First Treatment Date: 2022-06-06 - Last Treatment Date: 2022-07-19   Plan Name: Anus Site: Anus Technique: IMRT Mode: Photon Dose Per Fraction: 1.8 Gy Prescribed Dose (Delivered / Prescribed): 50.4 Gy / 50.4 Gy Prescribed Fxs (Delivered / Prescribed): 28 / 28     ==========ON TREATMENT VISIT DATES========== 2022-06-10, 2022-06-17, 2022-06-27, 2022-07-01, 2022-07-08, 2022-07-15   See weekly On Treatment Notes is Epic for details. The patient tolerated radiation. He developed fatigue and anticipated skin changes in the treatment field.   The patient will receive a call in about one month from the radiation oncology department. He will continue follow up with Dr. Truett Perna as well.      Osker Mason, PAC

## 2022-07-19 NOTE — Anesthesia Procedure Notes
History & Physical Attestation and Pre-Procedural Moderate Sedation Assessment History & Physical Attestation: This is a non-emergent procedure. I have completed (or reviewed and attested to) a History and Physical Exam written within the last 30 days, which is in the patient record. The patient has been assessed and examined prior to this procedure and no changes were noted unless otherwise documented here: NonePhysician Airway Assessment:Normal: As the licensed practitioner certified in moderate sedation and based on my review, immediately prior to this procedure, of the airway evaluation and other pertinent documentation in the patient's record, this patient is a suitable candidate for moderate sedation during the planned procedure.Procedural Sedation Risk Assessment:Additional risks, if any, related to procedural sedation are noted here: None

## 2022-07-19 NOTE — Telephone Encounter
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 She also stated she is requesting anesthesia for this procedure she would like to reschedule it for next week.

## 2022-07-19 NOTE — H&P
[facility]   History and Physical - Preprocedure   IR Central Phone :	[phone])HPI:  Courtney Knox is a 67 y.o. female with right lung nodule, here today for [place] guided right lung nodule biopsy.Past Medical History:  She has a past medical history of Colitis, COPD (chronic obstructive pulmonary disease) (HC Code), and Tobacco abuse.Past Surgical History:  She has no past surgical history on file.MEDICATIONSNo current facility-administered medications on file prior to encounter. Current Outpatient Medications on File Prior to Encounter Medication Sig Dispense Refill  alendronate (FOSAMAX) 70 mg tablet Take 1 tablet (70 mg total) by mouth every 7 days. (Patient not taking: Reported on 07/19/2022) 12 tablet 4  buPROPion XL (WELLBUTRIN XL) 150 mg 24 hr tablet Take 1 tablet (150 mg total) by mouth every morning. (Patient not taking: Reported on 06/14/2022) 90 tablet 1 ALLERGIESPenicillinOBJECTIVE:  Vital Signs:	Vitals:  07/19/22 0903 BP: 129/72 Pulse: 70 Resp: 18 Temp: (!) 96.9 ?F (36.1 ?C) 				Physical ExamPhysical ExamNeuro: awake and alert in NADAirway Evaluation: Class II: soft palate, fauces, portion of uvulaHeart:  warm, perfusedLungs:  comfortably conversantAbdomen: Non-tender Labs reviewed:Lab Results Component Value Date  CREATININE 0.47 06/25/2022  BUN 15 06/25/2022  NA 136 06/25/2022  K 4.5 06/25/2022  CL 101 06/25/2022  CO2 26 06/25/2022 Lab Results Component Value Date  ALT 34 06/25/2022  AST 44 (H) 06/25/2022  ALKPHOS 80 06/25/2022  BILITOT 0.5 06/25/2022 Lab Results Component Value Date  WBC 14.2 (H) 06/25/2022  HGB 14.5 06/25/2022  HCT 44.60 06/25/2022  MCV 94.1 06/25/2022  PLT 294 06/25/2022 Lab Results Component Value Date  INR 1.1 07/19/2022 Recent Labs Lab 04/23/240906 INR 1.1  ASSESSMENT/ PLAN:  Courtney Knox is a 67 y.o. female with right lung nodule, here today for [place] guided right lung nodule biopsy.Plan:- Proceed with procedure today.- Discharge post procedure after observation.--------------With urgent questions or concerns, please contact IR at: Inpatient -For [facility]: Page  INTERVENTIONAL RADIOLOGY CONSULT - IR - VASCULAR [facility] via Smart Web-For [facility]: Page  INTERVENTIONAL RADIOLOGY CONSULT - IR - VASCULAR [facility] via Smart Web- For [employer]: Use pager [phone] Outpatient (phone, all locations) - [phone] Alvera Singh, MD 07/19/2022

## 2022-07-19 NOTE — Plan of Care
Plan of Care Overview/ Patient Status    Problem: Procedural Patient Plan of CareGoal: Plan of Care ReviewOutcome: Interventions implemented as appropriateGoal: Patient-Specific Goal (Individualized)Outcome: Interventions implemented as appropriateGoal: Absence of Hospital-Acquired Illness or InjuryOutcome: Interventions implemented as appropriateGoal: Optimal Comfort and WellbeingOutcome: Interventions implemented as appropriateGoal: Readiness for Transition of CareOutcome: Interventions implemented as appropriateGoal: Recovery from Sedation EffectsOutcome: Interventions implemented as appropriate

## 2022-07-20 ENCOUNTER — Ambulatory Visit
Admit: 2022-07-20 | Payer: PRIVATE HEALTH INSURANCE | Attending: Student in an Organized Health Care Education/Training Program | Primary: Internal Medicine

## 2022-07-20 DIAGNOSIS — R918 Other nonspecific abnormal finding of lung field: Secondary | ICD-10-CM

## 2022-07-20 DIAGNOSIS — R911 Solitary pulmonary nodule: Secondary | ICD-10-CM

## 2022-07-20 NOTE — Other
[facility]	 [facility] HealthInterventional Pulmonary Surgically Focused H&PPatient Name: Courtney Knox of Birth: January 14, 1957Surgical Date: 07/21/22 Procedure: bronchoscopyDiagnosis/Indication: new right lung mass, ? Malignancy, lymphadenopathyPast Medical History:Past Medical History: Diagnosis Date  Colitis   COPD (chronic obstructive pulmonary disease) (HC Code)   Tobacco abuse  Medications:No current facility-administered medications for this encounter.Current Outpatient Medications:   alendronate (FOSAMAX) 70 mg tablet, Take 1 tablet (70 mg total) by mouth every 7 days. (Patient not taking: Reported on 07/19/2022), Disp: 12 tablet, Rfl: 4  buPROPion XL (WELLBUTRIN XL) 150 mg 24 hr tablet, Take 1 tablet (150 mg total) by mouth every morning. (Patient not taking: Reported on 06/14/2022), Disp: 90 tablet, Rfl: 1Facility-Administered Medications Ordered in Other Encounters:   fludeoxyglucose ((FDG P/DOSE)) injection 10 millicurie, 10 millicurie, Intravenous, ONCE PRN, Tomasa Rand, Melanie  iohexol (OMNIPAQUE) 350 mg/mL oral solution 25 mL, 25 mL, Oral, Once, Cunningham, MelanieAllergies:PenicillinPhysical Exam:There were no vitals filed for this visit.HEENT: mmmLungs: ctabCor: rrrExt: wwpImaging: Assessment/Plan:Courtney Knox is a 67 y.o. year old female who presents for evaluation of lymphadenopathy in the setting of right lung mass that is pet avid. Highly suspicious for malignancy. Lung mass involved with chest wall invasion.Will proceed with bronchoscopy with linear ebus. Procedure explained, time for questions provided, consent signed.  Please contact via the team Interventional Pulmonary dynamic role in [facility]

## 2022-07-21 ENCOUNTER — Other Ambulatory Visit: Payer: Self-pay

## 2022-07-21 ENCOUNTER — Ambulatory Visit
Admit: 2022-07-21 | Payer: BLUE CROSS/BLUE SHIELD | Attending: Student in an Organized Health Care Education/Training Program | Primary: Internal Medicine

## 2022-07-21 ENCOUNTER — Inpatient Hospital Stay
Admit: 2022-07-21 | Discharge: 2022-07-21 | Payer: BLUE CROSS/BLUE SHIELD | Attending: Internal Medicine | Primary: Internal Medicine

## 2022-07-21 ENCOUNTER — Encounter: Admit: 2022-07-21 | Payer: PRIVATE HEALTH INSURANCE | Attending: Internal Medicine | Primary: Internal Medicine

## 2022-07-21 DIAGNOSIS — R918 Other nonspecific abnormal finding of lung field: Secondary | ICD-10-CM

## 2022-07-21 DIAGNOSIS — Z880 Allergy status to penicillin: Secondary | ICD-10-CM

## 2022-07-21 DIAGNOSIS — J449 Chronic obstructive pulmonary disease, unspecified: Secondary | ICD-10-CM

## 2022-07-21 DIAGNOSIS — I10 Essential (primary) hypertension: Secondary | ICD-10-CM

## 2022-07-21 DIAGNOSIS — K529 Noninfective gastroenteritis and colitis, unspecified: Secondary | ICD-10-CM

## 2022-07-21 DIAGNOSIS — Z720 Tobacco use: Secondary | ICD-10-CM

## 2022-07-21 DIAGNOSIS — R911 Solitary pulmonary nodule: Secondary | ICD-10-CM

## 2022-07-21 MED ORDER — REMIFENTANIL INFUSION FOR ANESTHESIA
INTRAVENOUS | Status: DC | PRN
Start: 2022-07-21 — End: 2022-07-21
  Administered 2022-07-21: 17:00:00 100.000 mL/h via INTRAVENOUS

## 2022-07-21 MED ORDER — EPHEDRINE SULFATE 5 MG/ML INTRAVENOUS SOLUTION
5 mg/mL | INTRAVENOUS | Status: DC | PRN
Start: 2022-07-21 — End: 2022-07-21
  Administered 2022-07-21: 17:00:00 5 mg/mL via INTRAVENOUS

## 2022-07-21 MED ORDER — SODIUM CHLORIDE 0.9 % (FLUSH) INJECTION SYRINGE
0.9 % | INTRAVENOUS | Status: DC | PRN
Start: 2022-07-21 — End: 2022-07-21

## 2022-07-21 MED ORDER — DIMENHYDRINATE 5 MG/ML IN 0.9% SODIUM CHLORIDE
INTRAVENOUS | Status: DC | PRN
Start: 2022-07-21 — End: 2022-07-21

## 2022-07-21 MED ORDER — LIDOCAINE (PF) 10 MG/ML (1 %) INJECTION SOLUTION
101 mg/mL (1 %) | Freq: Once | TOPICAL | Status: DC
Start: 2022-07-21 — End: 2022-07-21

## 2022-07-21 MED ORDER — LACTATED RINGERS INTRAVENOUS SOLUTION
INTRAVENOUS | Status: DC | PRN
Start: 2022-07-21 — End: 2022-07-21
  Administered 2022-07-21: 17:00:00 via INTRAVENOUS

## 2022-07-21 MED ORDER — PROPOFOL 10 MG/ML INTRAVENOUS EMULSION
10 mg/mL | INTRAVENOUS | Status: DC | PRN
Start: 2022-07-21 — End: 2022-07-21
  Administered 2022-07-21: 17:00:00 10 mL/h via INTRAVENOUS

## 2022-07-21 MED ORDER — ACETAMINOPHEN 325 MG TABLET
325 mg | ORAL | Status: DC | PRN
Start: 2022-07-21 — End: 2022-07-21

## 2022-07-21 MED ORDER — FENTANYL (PF) 50 MCG/ML INJECTION SOLUTION
50 mcg/mL | INTRAVENOUS | Status: DC | PRN
Start: 2022-07-21 — End: 2022-07-21

## 2022-07-21 MED ORDER — ONDANSETRON HCL (PF) 4 MG/2 ML INJECTION SOLUTION
42 mg/2 mL | INTRAVENOUS | Status: DC | PRN
Start: 2022-07-21 — End: 2022-07-21

## 2022-07-21 MED ORDER — REMIFENTANIL 1 MG INTRAVENOUS SOLUTION
1 mg | Status: CP
Start: 2022-07-21 — End: ?

## 2022-07-21 MED ORDER — PROPOFOL 10 MG/ML INTRAVENOUS EMULSION
10 mg/mL | Status: DC | PRN
Start: 2022-07-21 — End: 2022-07-21
  Administered 2022-07-21 (×2): 10 mg/mL

## 2022-07-21 MED ORDER — PHENYLEPHRINE 1 MG/10 ML (100 MCG/ML) IN 0.9 % SOD.CHLORIDE IV SYRINGE
110100 mg/0 mL (00 mcg/mL) | INTRAVENOUS | Status: DC | PRN
Start: 2022-07-21 — End: 2022-07-21
  Administered 2022-07-21: 17:00:00 1 mg/0 mL (00 mcg/mL) via INTRAVENOUS

## 2022-07-21 MED ORDER — SODIUM CHLORIDE 0.9 % INTRAVENOUS SOLUTION
INTRAVENOUS | Status: DC
Start: 2022-07-21 — End: 2022-07-21

## 2022-07-21 MED ORDER — ONDANSETRON HCL (PF) 4 MG/2 ML INJECTION SOLUTION
42 mg/2 mL | INTRAVENOUS | Status: DC | PRN
Start: 2022-07-21 — End: 2022-07-21
  Administered 2022-07-21: 17:00:00 4 mg/2 mL via INTRAVENOUS

## 2022-07-21 MED ORDER — LIDOCAINE (PF) 20 MG/ML (2 %) INTRAVENOUS SOLUTION
202 mg/mL (2 %) | INTRAVENOUS | Status: DC | PRN
Start: 2022-07-21 — End: 2022-07-21
  Administered 2022-07-21: 17:00:00 20 mg/mL (2 %) via INTRAVENOUS

## 2022-07-21 MED ORDER — BUPROPION HCL XL 150 MG 24 HR TABLET, EXTENDED RELEASE
150 | ORAL_TABLET | Freq: Every morning | ORAL | 2 refills | 90.00000 days | Status: AC
Start: 2022-07-21 — End: 2022-10-04

## 2022-07-21 MED ORDER — SODIUM CHLORIDE 0.9 % (FLUSH) INJECTION SYRINGE
0.9 % | Freq: Three times a day (TID) | INTRAVENOUS | Status: DC
Start: 2022-07-21 — End: 2022-07-21

## 2022-07-21 MED ORDER — NALOXONE 0.4 MG/ML INJECTION SOLUTION
0.4 mg/mL | INTRAVENOUS | Status: DC | PRN
Start: 2022-07-21 — End: 2022-07-21

## 2022-07-21 NOTE — Discharge Instructions
[facility] 			Thank you very much for allowing me to see you today.  I appreciate the opportunity to participate in your care.You received sedation today for your procedure.  Do not drive, consume alcohol, make legal decisions, operate heavy machinery or perform strenuous activity today.  You may, however, resume your regular diet today.You may also resume your regular medications today.  No changes were made to your medications.  If you were asked to hold Plavix or Coumadin prior to the procedure, you may restart that medication tonight.You may resume your regular activities tomorrow.  You may have a cough that contains blood for a day or two after the procedure.  The blood will typically be mixed with sputum and less than a handful.  You may also have a fever or chills.  If you have a fever, you may take Tylenol or Ibuprofen at home (unless you typically are not allowed to take these medications).  This should resolve in 48 hours.If you had biopsies taken today, results may take up to a week to be finalized. You should contact your referring physician for results.  If you have not received your results, please call the office at [phone].  Quentin Angst will take a message and direct you to the appropriate individual.  If you have any questions or concerns after your procedure please call this same number.For urgent messages after-hours, please call the hospital operator at [phone].For emergencies  after the procedure, you shouldcall 911.  This includes experiencing worsening shortness of breath after the procedure.  It has been a pleasure being part of your medical care.Geryl Rankins, MDInterventional PulmonaryDepartment of Pulmonology, Critical Care and Sleep MedicineYale-New Chapman Medical Center

## 2022-07-21 NOTE — Other
Post Anesthesia Transfer of Care NotePatient: Courtney JohnsonProcedure(s) Performed: Procedure(s) (LRB):BRONCH EBUS SAMPLNG 3/> NODE (N/A)Last Vitals: I have reviewed the post-operative vital signs during the handoff as noted in the Epic chart.POSTOP HANDOFF :      Patient Location:  PACU     Level of Consciousness:  Awake     VS stable since last recorded intra-op set? Yes       Oxygen source: maskPatient co-morbidities, intra-operative course, intake & output and antibiotics as per Anesthesia record were discussed with the RN.

## 2022-07-21 NOTE — Other
Pt transported to PACU via stretcher with CRNA and RN. Pt HD stable, denies SOB/dizziness/CV complaints/pain at this time. Discharge instructions reviewed with pt at bedside, pt verbalizing understanding. All safety maintained. Pt assisted off unit at time of discharge by unit staff via wheelchair for safety.

## 2022-07-21 NOTE — Other
[facility] Bronchoscopy Procedure [place] [id number] Date of procedure: 4/25/2024Attending: Geryl Rankins, MDIndication: PET Avid Lung LesionsProcedure(s) Performed:Flexible bronchoscopyEBUS - 11L, 7, 4RAfter informed consent was obtained, a time out for patient safety was performed per protocol.  The patient was induced by the anesthesia team.The scope was advanced through the airway.  Aerosolized 1% lidocaine was used to anesthetize the vocal cords, carina and bilateral hila.  The scope was then advanced to the left and right tracheobronchial trees.  The airways were normal to the level of subsegmental bronchi.  The convex probe endobronchial ultrasound (EBUS) scope was advanced through the vocal cords. Complete EBUS scanning of the mediastinum and bilateral hila revealed present, normal appearing, 11R, 7,and 11L nodes. Sub-32mm nodes were seen in the remaining stations.   After identifying the targets, a 21g needle was used to perform TBNA with EBUS-guidance. The needle was passed through the target 20-30 times. This was repeated three times per station.  No further diagnostic or therapeutic procedures were indicated and the procedure was concluded.  No immediate complications were noted.  Images from the procedure were saved into Provation and are available for review.The procedure was performed by myself with the assistance of Peter Minium, MD.Taiki Buckwalter Gwenevere Abbot, MD Interventional Pulmonary AttendingDivision of Pulmonary, Critical Care and Sleep MedicineOffice:  [phone] contact via the team Interventional Pulmonary dynamic role in [facility]

## 2022-07-21 NOTE — Anesthesia Post-Procedure Evaluation
Anesthesia Post-op NotePatient: Courtney JohnsonProcedure(s):  Procedure(s) (LRB):BRONCH EBUS SAMPLNG 3/> NODE (N/A) Last Vitals:  I have reviewed the post-operative vital signs as noted in the Epic chart.POSTOP EVALUATION:      Patient Recovery Location:  PACU     Vital Signs Status:  Stable     Patient Participation:  Patient participated     Mental Status:  Awake, oriented and alert     Respiratory Status:  Acceptable     Airway Patency:  Patent     Cardiovascular/Hydration Status:  Stable     Pain Management:  Satisfactory to patient     Nausea/Vomiting Status:  Satisfactory to patientThere were no known notable events for this encounter.

## 2022-07-21 NOTE — OR Nursing
Pt. Brought to room 3 and timeout performed. Pt. Sedated, LMA placed, and monitored by anesthesia. Lidocaine to airways report to [facility].n.

## 2022-07-21 NOTE — Anesthesia Pre-Procedure Evaluation
This is a 67 y.o. female scheduled for BRONCH EBUS SAMPLNG 3/> NODE.Review of Systems/ Medical HistoryPatient summary, Labs, pre-procedure vitals, height, weight and NPO status reviewed.No previous anesthesia concernsAnesthesia Evaluation:   No history of anesthetic complications  Estimated body mass index is 16.92 kg/m? as calculated from the following:  Height as of 07/19/22: 5' 4 (1.626 m).  Weight as of this encounter: 44.7 kg. CC/HPI: 73F presents for evaluation of lymphadenopathy in the setting of right lung mass, [facility] EBUSPast Medical History:No date: ColitisNo date: COPD (chronic obstructive pulmonary disease) (HC Code)No date: Tobacco abuseAllergies: -- Penicillin -- UnknownPast Surgical History:  No past anesthesia history on file.No past surgical history on file.Cardiovascular:    -Exercise tolerance: >4 METS -Vascular Disease:  Negative    Respiratory: -Lung Disorders:      -COPD:  yes-Comments: PET [place] Skull to Thigh Area Initial - 4/20/24Impression1. Intensely hypermetabolic right upper lobe pleural-based lung mass with chest wall and right 5-6th ribs invasion, highly suspicious for malignancy. Further correlation with tissue sampling.2. Biapical hypermetabolic scarring with more prominent on the left apical nodularity, nonspecific but concerning for malignant involvement. Clinical correlation.3. Mildly hypermetabolic small subcarinal node is nonspecific and could be reactive although metastasis not excluded.[place] CHEST WO IV CONTRAST Date: 07/01/2022 6:33 PMFINDINGS:?Lungs/Airways/Pleura: Right upper lobe mass measures 51 x 33 mm in maximal axial dimension and 49 mm in craniocaudal dimension measured on the sagittal images (4:08). 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 There is severe scarring with volume loss and bronchiectasis in the upper lobes extending to the apices associated pleural fluid and pleural thickening and calcification.  The lungs are hyperaerated with severe flattening of the diaphragms and increased AP diameter of the chest.  No pneumothorax. Mediastinum/Lymph nodes: No enlarged lymph nodes.Heart and Vessels:  Normal heart size.  No pericardial effusion. No thoracic aortic aneurysm.  Mild atherosclerotic calcifications of the coronary arteries.Visualized Upper Abdomen: Severe upper abdominal vascular calcifications.Bones and Soft Tissues: Severe compression deformity at T12. Cachexia.IMPRESSION:Right upper lobe mass with chest wall invasion.HEENT: Negative.Neuromuscular: NegativeSkeletal/Skin:  NegativeGastrointestinal/Genitourinary: -Nutritional Disorders: Pt is cachectic per BMI definition, (BMI <20).Hematological/Lymphatic: -Anemia:  Patient does not have anemia.-Coagulopathy:  She has no thrombocytopenia.Behavioral/Social/Psychiatric & Syndromes: NegativeAdditional Findings: Lab Results     Component                Value               Date                     WBC                      14.2 (H)            06/25/2022               HGB                      14.5                06/25/2022               HCT                      44.60  06/25/2022               PLT                      294                 06/25/2022               ALT                      34                  06/25/2022               AST                      44 (H)              06/25/2022               NA                       136                 06/25/2022               K                        4.5                 06/25/2022               CL                       101                 06/25/2022               CREATININE               0.47 06/25/2022               BUN                      15                  06/25/2022               CO2                      26                  06/25/2022               GLU                      87                  07/16/2022               HGBA1C                   5.6                 06/02/2022               TSH  2.090               06/02/2022               INR                      1.1                 07/19/2022          Physical ExamCardiovascular:    normal exam  Pulmonary:  normal exam  Airway:  Mallampati: ITM distance: >3 FBNeck ROM: fullMouth Opening: >3cmDental:  Dentition: missingAnesthesia PlanASA 3 The primary anesthesia plan is  general. Perioperative Code Status confirmed: It is my understanding that the patient is currently designated as 'Full Code' and will remain so throughout the perioperative period.Anesthesia informed consent obtained. Consent obtained from: patientUse of blood products: consented  The post operative pain plan is IV analgesics.Plan discussed with Resident and Attending.Anesthesiologist's Pre Op NoteI personally evaluated and examined the patient prior to the intra-operative phase of care on the day of the procedure.Marland Kitchen

## 2022-07-21 NOTE — Other
Report from [place], [place]. EBUS in progress. FNA 11L, station 7, 11R.No active bleeding in airways at end of procedure. LMA removed by anesthesia. Pt transferred to RR and report given to PACU RN.

## 2022-07-21 NOTE — Other
Operative Diagnosis:Pre-op:   Lung nodule  Patient Coded Diagnosis   Pre-op diagnosis: Lung nodule  Post-op diagnosis: Lung nodule  Patient Diagnosis   None    Post-op diagnosis:   * Lung nodule [R91.1]Operative Procedure(s) :Procedure(s) (LRB):BRONCH EBUS SAMPLNG 3/> NODE (N/A)Post-op Procedure & Diagnosis ConfirmationPost-op Diagnosis: Post-op Diagnosis updated (see notes)     - Lung nodule Post-op Procedure: Post-op Procedure updated (see notes)     - BRONCH EBUS SAMPLNG 3/> NODE

## 2022-07-22 ENCOUNTER — Inpatient Hospital Stay: Admit: 2022-07-22 | Discharge: 2022-07-22 | Payer: BLUE CROSS/BLUE SHIELD | Primary: Internal Medicine

## 2022-07-22 ENCOUNTER — Encounter: Admit: 2022-07-22 | Payer: PRIVATE HEALTH INSURANCE | Attending: Radiation Oncology | Primary: Internal Medicine

## 2022-07-22 DIAGNOSIS — J449 Chronic obstructive pulmonary disease, unspecified: Secondary | ICD-10-CM

## 2022-07-22 DIAGNOSIS — K529 Noninfective gastroenteritis and colitis, unspecified: Secondary | ICD-10-CM

## 2022-07-22 DIAGNOSIS — Z720 Tobacco use: Secondary | ICD-10-CM

## 2022-07-22 DIAGNOSIS — R918 Other nonspecific abnormal finding of lung field: Secondary | ICD-10-CM

## 2022-07-22 NOTE — Progress Notes
Re: Aveena Dephillips ([birth year])MRN: [id number] Provider: Jaclyn Prime, MDReferring Provider: Dr. Gladis Riffle of service: 07/22/2022 RADIATION ONCOLOGY CONSULTATION REPORTIDENTIFICATION / CHIEF COMPLAINT: Ms. Kaira Hamiter is a 67 y.o. 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 Possible N2 subcarinal node, possible M1a contralateral lung nodule versus separate primary.The patient is referred by Dr. Lennox Grumbles, MD for initial consultation, opinion, and discussion of radiation therapy.HISTORY OF PRESENT ILLNESS: The patient initially presented with incidentally found lung mass. 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 She first noticed right rib pain around 5 months ago. Subsequently referred to Dr. Clydene Pugh for consultation.06/25/2022 [place] A/P showed findings concerning for colitis predominant involvement of the right colon and cecum.07/01/2022 [place] chest showed RIGHT upper lobe mass with chest wall invasion. Mass measures 5.1 x 3.3 x 4.9 cm. This mass erodes into the posterior chest wall with adjacent erosion of the RIGHT posterior fifth and sixth ribs.07/06/2022 FEV1 37% predicted, FEV1/FVC 35%, DLCO 20%. Severe obstruction, severely impaired diffusing capacity.07/09/2022 MRI brain with no evidence of metastatic disease to the brain.07/16/2022 FDG PET/ showed intensely hypermetabolic RIGHT upper lobe pleural based lung mass with chest wall and right 5-6th ribs invasion. Biapical hypermetabolic scarring with more prominent on the LEFT apical nodularity, nonspecific but concerning for malignant involvement.07/21/2022 EBUS with path pendingToday, she is feeling fairly well overall and in good spirits. She confirms the above history. Today her pain is tolerable and she has some pain that wraps around bilateral shoulders. She has a chronic cough. She had some mild hemoptysis since her EBUS, but had no hemoptysis prior to this. She has some mild exertional dyspnea, but is able to complete all of her ADLs and climb stairs. She continues to care for her horse at her home. RADIATION THERAPY HISTORY: History of colitis, no ulcerative colitis or crohn's.The patient denies any previous history of radiation therapy, collagen-vascular disease, or pacemaker placement.  PAST MEDICAL HISTORY: Past Medical History: Diagnosis Date  Colitis   COPD (chronic obstructive pulmonary disease) (HC Code)   Tobacco abuse  PAST SURGICAL HISTORY: No past surgical history on file.ALLERGIES: Allergies Allergen Reactions  Penicillin Unknown                            MEDICATIONS: Current Outpatient Medications on File Prior to Encounter Medication Sig Dispense Refill  buPROPion XL (WELLBUTRIN XL) 150 mg 24 hr tablet Take 1 tablet (150 mg total) by mouth every morning. 90 tablet 1 Current Facility-Administered Medications on File Prior to Encounter Medication Dose Route Frequency Provider Last Rate Last Admin  [DISCONTINUED] acetaminophen (TYLENOL) tablet 650 mg  650 mg Oral Q4H PRN Hilda Blades, MD      [DISCONTINUED] dimenhyDRINATE (DRAMAMINE) 25 mg in sodium chloride 0.9% PF 5 mL Injection  25 mg IV Push Q15 MIN PRN Hilda Blades, MD      [DISCONTINUED] ePHEDrine sulfate (EMERPHED) 5 mg/mL injection   IV Push PRN Hilda Blades, MD   10 mg at 07/21/22 1313  [DISCONTINUED] fentaNYL PF (SUBLIMAZE) injection 50 mcg  50 mcg IV Push Q10 MIN PRN Hilda Blades, MD      [DISCONTINUED] lactated ringers infusion   Intravenous Continuous PRN Hilda Blades, MD  Stopped at 07/21/22 1333  [DISCONTINUED] lidocaine (PF) (XYLOCAINE) 10 mg/mL (1 %) injection 15 mL  15 mL Topical (Top) Once, Intra Procedure Peter Minium, MD      [DISCONTINUED] lidocaine injection Soln   Intravenous PRN Hilda Blades, MD   100 mg at 07/21/22 1305  [DISCONTINUED] naloxone (NARCAN) injection 0.04 mg  0.04 mg IV Push Q5 MIN PRN Hilda Blades, MD      [DISCONTINUED] ondansetron (PF) (ZOFRAN) injection 4 mg  4 mg IV Push Q10 MIN PRN Hilda Blades, MD      [DISCONTINUED] ondansetron (PF) (ZOFRAN) injection   Intravenous PRN Hilda Blades, MD   4 mg at 07/21/22 1313  [DISCONTINUED] phenylephrine HCl in 0.9% NaCl 1 mg/10 mL (100 mcg/mL) syringe   Intravenous PRN Hilda Blades, MD   100 mcg at 07/21/22 1305  [DISCONTINUED] Propofol - Injection (DIPRIVAN) IV push only   Intravenous Continuous PRN Hilda Blades, MD   Stopped at 07/21/22 1330  [DISCONTINUED] Propofol - Injection (DIPRIVAN) IV push only   Other PRN Hilda Blades, MD   50 mg at 07/21/22 1309  [DISCONTINUED] remifentaniL (ULTIVA) 1 mg in sodium chloride 0.9% 100 mL (8 mcg/mL) infusion   Intravenous Continuous PRN Hilda Blades, MD   Stopped at 07/21/22 1330  [DISCONTINUED] sodium chloride 0.9 % flush 3 mL  3 mL IV Push Q8H Brahmandam, Sruti, MD      [DISCONTINUED] sodium chloride 0.9 % flush 3 mL  3 mL IV Push PRN for Line Care Peter Minium, MD      [DISCONTINUED] sodium chloride 0.9% infusion  50 mL/hr Intravenous Continuous Brahmandam, Sruti, MD     SOCIAL HISTORY:  Social History Tobacco Use  Smoking status: Every Day   Current packs/day: 1.00   Average packs/day: 1 pack/day for 51.3 years (51.3 ttl pk-yrs)   Types: Cigarettes   Start date: 1973  Smokeless tobacco: Never Vaping Use  Vaping Use: Never used Substance Use Topics  Alcohol use: Yes  Drug use: Never FAMILY HISTORY: Family History Problem Relation Age of Onset  Lung cancer Father   Lymphoma Brother   Cancer Maternal Aunt   REVIEW OF SYSTEMS:A comprehensive 12-point review of systems was queried and is otherwise negative.PHYSICAL EXAM:ECOG PS: 1Pain Score:   2BP 115/70  - Pulse 70  - Temp 97.4 ?F (36.3 ?C) (Temporal)  - Resp 18  - Wt 46.7 kg  - SpO2 100%  - BMI 17.68 kg/m? General: Patient appears well, in no apparent distress.HEENT: Normocephalic, atraumatic. Cardiovascular: Regular rate. Respiratory: Clear, unlabored breathing. Abdomen: Non-distended. Extremities: No clubbing, cyanosis, or edema. Tenderness to palpation in the RIGHT posterior chest wallDermatologic: No apparent rashes. Neurologic: Awake, alert, and oriented. Affect appropriate. LABS:I have reviewed the patient?s pertinent labs as resulted in the EMR.ASSESSMENT/PLAN:Ms. Quanna Schollmeyer is a 67 y.o. 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 Possible N2 subcarinal node, possible M1a contralateral lung nodule versus separate primary.Seen today with her daughter, Ms. Bute is doing fairly well overall and in good spirits. We discussed her clinical course to date including presentation and imaging reports. We discussed management for likely locally advanced lung cancer including surgery, systemic therapy and radiation therapy. We discussed the logistics and role of radiation therapy with the benefit of significantly improving locoregional control and the curative intent if this is non-metastatic disease.We discussed the possible adverse effects of radiation therapy, which can include but are not limited to fatigue; dermatitis;  cytopenia; pneumonitis; esophagitis; hemorrhage; necrosis; fistula; perforation; stricture; fibrosis; rib fracture; chest wall pain; nausea; vomiting; hypothyroidism; brachial plexopathy; cardiac toxicity; and development of secondary cancers.She expressed understanding of these risks and benefits and has made an informed decision to proceed with treatment.At this time, she has pending pathology from EBUS and FNA of 11L/R and station 7 nodes. She has unbiopsied likely primary in the RIGHT lung and unbiopsied questionable process in the LEFT lung. 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 We will plan for multidisciplinary tumor board discussion on 08/01/22 to allow for pathology from EBUS on 4/25 to result and to determine consensus recommendation for further work up and treatment.She is in agreement with this plan. We encouraged her to contact us at any time if additional questions or concerns arise.Recommendations:MDTB 5/6/24Biopsy of bilateral lung Maple Hudson, MD, MSPGY3, Radiation OncologyDepartment of Therapeutic RadiologyYale School of MedicineATTENDING ADDENDUM: I have personally seen and evaluated Rodena Medin with Dr. Ralph Leyden. I have personally reviewed the patient's prior records and all available imaging noted in the HPI. I have incorporated my edits and agree with the above documented history, physical examination, assessment, and plan.  In brief, Ms. Kawana Roseland is a 67 y.o. 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 Possible N2 subcarinal node, possible M1a contralateral lung nodule versus separate primary. 07/01/2022 [place] chest showed RIGHT upper lobe mass with chest wall invasion. Mass measures 5.1 x 3.3 x 4.9 cm. This mass erodes into the posterior chest wall with adjacent erosion of the RIGHT posterior fifth and sixth ribs. 07/09/2022 MRI brain with no evidence of metastatic disease to the brain. 07/16/2022 FDG PET/[place] showed intensely hypermetabolic RIGHT upper lobe pleural based lung mass with chest wall and right 5-6th ribs invasion. Biapical hypermetabolic but with focal hypermetabolic uptake on the LEFT apical nodularity, nonspecific but concerning for malignant involvement. 07/21/2022 EBUS with path pendingShe feels well overall. Has chronic cough. Mild hemoptysis since procedure yesterday. Exam: She is in NAD. Breathing comfortably. A/P: The patient has an at least cT3N0 RUL presumed lung cancer. Nodal eval is pending. Left upper lobe scarring with focal uptake (SUVm ~6.0) is concerning for malignant involvement (?second primary vs related process).  I have discussed that we will at least need tissue from the RUL lesion and potentially the LUL lesion. I will work with IR and IP to discuss best approach. Seeing Dr. Maxcine Ham 07/26/22. Tumor board Monday.Alphonzo Dublin, MD, PhDYale Radiation OncologyApril 26, 202410:33 [facility]: Dr. Clydene Pugh

## 2022-07-25 ENCOUNTER — Telehealth: Admit: 2022-07-25 | Payer: PRIVATE HEALTH INSURANCE | Primary: Internal Medicine

## 2022-07-25 ENCOUNTER — Encounter: Admit: 2022-07-25 | Payer: PRIVATE HEALTH INSURANCE | Primary: Internal Medicine

## 2022-07-25 ENCOUNTER — Telehealth
Admit: 2022-07-25 | Payer: PRIVATE HEALTH INSURANCE | Attending: Student in an Organized Health Care Education/Training Program | Primary: Internal Medicine

## 2022-07-25 NOTE — Telephone Encounter
Called per patient request and LVM to call us back.

## 2022-07-25 NOTE — Telephone Encounter
Initial phone call made to patient prior to scheduled office visit with [name]. Patient did not answer, therefore left voicemail and abstract completed with information available for chart review. Mychart access: Active  Reason for referral: Newly diagnosed NSCL Referring provider: Sim Boast, MDSocial History: Tobacco use: Everyday 1 ppd smoker x51 yearsETOH use: YesSubstance use: No Initial nursing assessment and distress screening to be reviewed with patient at the time of the visit. Contact information provided for any questions/concerns prior to or after clinic visit.

## 2022-07-25 NOTE — Telephone Encounter
[facility] MESSAGETime of call:   1:58 PMCaller:   DebraCaller's relationship to patient:  self  Calling from (pharmacy, hospital, agency, etc.):  n/a   Reason for call:   Patient calling to say she need to speak to someone regarding her X-Rays she feels like she last office notes are not correct. Patient would like to speak to someone to clear up something's for her ins Please Advise.If not feeling well, what are symptoms:  n/a   If having symptoms, how long have the symptoms been present:  n/a   Does caller request to speak to someone urgently?  no   If yes, warm transferred to:  n/aProvider:  Dr. Scherry Ran clinic visit date: n/aBest telephone number for callback:   [phone] Best time to return call (encourage patient to be available for callback):   Any Permission to leave message:  yes   [facility] Referral Specialist

## 2022-07-25 NOTE — Telephone Encounter
Patient call back. Patient will discuss with Dr Chales Abrahams on 5/10 about results Xrays and his note.

## 2022-07-26 ENCOUNTER — Telehealth: Admit: 2022-07-26 | Payer: PRIVATE HEALTH INSURANCE | Primary: Internal Medicine

## 2022-07-26 ENCOUNTER — Encounter: Admit: 2022-07-26 | Payer: PRIVATE HEALTH INSURANCE | Attending: Hematology | Primary: Internal Medicine

## 2022-07-26 ENCOUNTER — Ambulatory Visit: Admit: 2022-07-26 | Payer: BLUE CROSS/BLUE SHIELD | Attending: Hematology | Primary: Internal Medicine

## 2022-07-26 DIAGNOSIS — Z720 Tobacco use: Secondary | ICD-10-CM

## 2022-07-26 DIAGNOSIS — R918 Other nonspecific abnormal finding of lung field: Secondary | ICD-10-CM

## 2022-07-26 DIAGNOSIS — K529 Noninfective gastroenteritis and colitis, unspecified: Secondary | ICD-10-CM

## 2022-07-26 DIAGNOSIS — J449 Chronic obstructive pulmonary disease, unspecified: Secondary | ICD-10-CM

## 2022-07-26 NOTE — Telephone Encounter
Radiation Oncology Phone DocumentationMs. Courtney Knox is a 67 y.o. woman who recently established care in our department with Dr. Hillard Danker for a diagnosis of a RIGHT upper lobe presumed NSCLC at least cT3 with invasion of RIGHT posterior chest wall and ribs 5-6. She additionally has a possible cN2 subcarinal node, possible cM1a contralateral lung nodule versus synchronous primary. I called [name] today to relay the recommendations from the tumor board discussion about her case that took place yesterday, 4/29.  She did not answer the phone.  I left a voicemail requesting a call back.Will alert Dr. Valora Corporal.Herold Harms, MDResident Physician, PGY-2Department of Therapeutic RadiologyMHB: [phone]: Courtney Knox called me back around 4:30 PM.  I was able to relay to her the tumor board's recommendation for a [place] guided biopsy of the RIGHT sided lesion.  She was agreeable to this on the condition it can be done under anesthesia.  I will re-order the biopsy and specify in the order that it must be done under anesthesia.All questions answered to apparent satisfaction.  Will discuss with Dr. Devra Dopp, MDResident Physician, PGY-2Department of Therapeutic RadiologyMHB: [phone]

## 2022-07-27 ENCOUNTER — Encounter: Admit: 2022-07-27 | Payer: PRIVATE HEALTH INSURANCE | Attending: Radiation Oncology | Primary: Internal Medicine

## 2022-07-27 DIAGNOSIS — R918 Other nonspecific abnormal finding of lung field: Secondary | ICD-10-CM

## 2022-07-28 ENCOUNTER — Encounter: Admit: 2022-07-28 | Payer: PRIVATE HEALTH INSURANCE | Primary: Internal Medicine

## 2022-07-28 DIAGNOSIS — R918 Other nonspecific abnormal finding of lung field: Secondary | ICD-10-CM

## 2022-07-29 ENCOUNTER — Inpatient Hospital Stay: Admit: 2022-07-29 | Discharge: 2022-07-29 | Payer: BLUE CROSS/BLUE SHIELD | Primary: Internal Medicine

## 2022-07-29 ENCOUNTER — Ambulatory Visit: Admit: 2022-07-29 | Payer: BLUE CROSS/BLUE SHIELD | Attending: Radiation Oncology | Primary: Internal Medicine

## 2022-07-29 ENCOUNTER — Encounter
Admit: 2022-07-29 | Payer: BLUE CROSS/BLUE SHIELD | Attending: Student in an Organized Health Care Education/Training Program | Primary: Internal Medicine

## 2022-07-29 DIAGNOSIS — R918 Other nonspecific abnormal finding of lung field: Secondary | ICD-10-CM

## 2022-07-29 LAB — BASIC METABOLIC PANEL
BKR ANION GAP: 11 pg (ref 7–17)
BKR BUN / CREAT RATIO: 36.5 — ABNORMAL HIGH (ref 8.0–23.0)
BKR CALCIUM: 9.5 mg/dL (ref 8.8–10.2)
BKR CO2: 25 mmol/L (ref 20–30)
BKR CREATININE: 0.52 mg/dL (ref 0.40–1.30)
BKR EGFR, CREATININE (CKD-EPI 2021): >60 mL/min/{1.73_m2} (ref >=60–50.0)
BKR GLUCOSE: 90 mg/dL (ref 70–100)
BKR POTASSIUM: 4.3 mmol/L (ref 3.3–5.3)
BKR SODIUM: 142 mmol/L (ref 136–144)

## 2022-07-29 LAB — CBC WITH AUTO DIFFERENTIAL
BKR BLOOD UREA NITROGEN: 12 % (ref 11.0–15.0)
BKR CHLORIDE: 43.1 % (ref 35.00–45.00)
BKR WAM ABSOLUTE IMMATURE GRANULOCYTES.: 0.02 x 1000/??L (ref 0.00–0.30)
BKR WAM ABSOLUTE LYMPHOCYTE COUNT.: 1.73 x 1000/??L (ref 0.60–3.70)
BKR WAM ABSOLUTE NRBC (2 DEC): 0 x 1000/??L (ref 0.00–1.00)
BKR WAM ANALYZER ANC: 4.3 x 1000/??L (ref 2.00–7.60)
BKR WAM BASOPHILS: 0.9 % (ref 0.0–1.4)
BKR WAM EOSINOPHIL ABSOLUTE COUNT.: 0.26 x 1000/ÂµL (ref 0.00–1.00)
BKR WAM EOSINOPHILS: 3.8 % (ref 0.0–5.0)
BKR WAM HEMATOCRIT (2 DEC): 43.1 % (ref 35.00–45.00)
BKR WAM HEMOGLOBIN: 14 g/dL (ref 11.7–15.5)
BKR WAM IMMATURE GRANULOCYTES: 0.3 % (ref 0.0–1.0)
BKR WAM LYMPHOCYTES: 25 % (ref 17.0–50.0)
BKR WAM MCH (PG): 31 pg (ref 27.0–33.0)
BKR WAM MCHC: 32.5 g/dL (ref 31.0–36.0)
BKR WAM MCV: 95.4 fL (ref 80.0–100.0)
BKR WAM MONOCYTE ABSOLUTE COUNT.: 0.54 x 1000/ÂµL (ref 0.00–1.00)
BKR WAM MONOCYTES: 7.8 % (ref 4.0–12.0)
BKR WAM MPV: 9.4 fL (ref 8.0–12.0)
BKR WAM NEUTROPHILS: 62.2 % — ABNORMAL HIGH (ref 39.0–72.0)
BKR WAM NUCLEATED RED BLOOD CELLS: 0 % (ref 0.0–1.0)
BKR WAM PLATELETS: 362 x1000/??L (ref 150–420)
BKR WAM RDW-CV: 12 % (ref 11.0–15.0)
BKR WAM RED BLOOD CELL COUNT.: 4.52 M/ÂµL (ref 4.00–6.00)
BKR WAM WHITE BLOOD CELL COUNT: 6.9 x1000/??L (ref 4.0–11.0)

## 2022-07-29 LAB — PROTIME AND INR
BKR INR: 0.99 (ref 0.86–1.12)
BKR PROTHROMBIN TIME: 11 seconds (ref 9.6–12.3)

## 2022-07-29 MED ORDER — GADOTERATE MEGLUMINE 0.5 MMOL/ML (376.9 MG/ML) INTRAVENOUS SOLUTION
0.5376.9 mmol/mL (376.9 mg/mL) | Freq: Once | INTRAVENOUS | Status: CP | PRN
Start: 2022-07-29 — End: ?
  Administered 2022-07-29: 22:00:00 0.5 mL via INTRAVENOUS

## 2022-07-30 NOTE — Progress Notes
INITIAL STUDIES:06/25/2022 [place] A/P showed findings concerning for colitis predominant involvement of the right colon and cecum.07/01/2022 [place] chest showed RIGHT upper lobe mass with chest wall invasion. Mass measures 5.1 x 3.3 x 4.9 cm. This mass erodes into the posterior chest wall with adjacent erosion of the RIGHT posterior fifth and sixth ribs.07/06/2022 FEV1 37% predicted, FEV1/FVC 35%, DLCO 20%. Severe obstruction, severely impaired diffusing capacity.07/09/2022 MRI brain with no evidence of metastatic disease to the brain.07/16/2022 FDG PET/[place] showed intensely hypermetabolic RIGHT upper lobe pleural based lung mass with chest wall and right 5-6th ribs invasion. Biapical hypermetabolic scarring with more prominent on the LEFT apical nodularity, nonspecific but concerning for malignant involvement.07/21/22 Path penAccession #: [id number]        1) LYMPH NODE, 11L, FINE NEEDLE ASPIRATION/WASH:           - NEGATIVE FOR MALIGNANT CELLS      - POLYMORPHOUS POPULATION OF LYMPHOCYTES. 2) LYMPH NODE, STATION 7, FINE NEEDLE ASPIRATION/WASH:           - NEGATIVE FOR MALIGNANT CELLS      - POLYMORPHOUS POPULATION OF LYMPHOCYTES. 3) LYMPH NODE, 11R, FINE NEEDLE ASPIRATION/WASH:           - NEGATIVE FOR MALIGNANT CELLS      - POLYMORPHOUS POPULATION OF LYMPHOCYTES. PAST MEDICAL/ SURGICAL HISTORY:  Colitis, COPD (chronic obstructive pulmonary disease), osteoporosis. Left wrist surgery. 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 Continues to smoke with roughly 40 pack year history.FAMILY HISTORY:  Father, lung cancer; brother, lymphomaMEDICATIONS:  Medication list reviewedALLERGIES:  PCNOn the day of this patient's encounter, over 60 minutes were personally spent by me (either directly with patient, or reviewing lab, imaging, other provider notes/ procedures and/or communicating directly with other specialties regarding patient). This does not include any resident/fellow teaching time, or any time spent performing a procedural service.REVIEW OF SYSTEMS:  As described below and in chart.  Remaining review of systems is negative.Courtney Knox appears well. Maintains an ECOG performance status of one. Reports persistent right lateral chest comfort over the last several months. Cough worsened after recent bronchoscopy with some hemoptysis, which has resolved. No worsening dyspnea on exertion. Appetite and energy fair.Imaging reviewed at recent thoracic oncology tumor board where consensus was T3N0 right lung cancer with questionable process in the left, inflammatory/infectious vs. unrelated second primary lung cancer. Recommendation of Tumor Board was to pursue stereotactic radiosurgery if feasible to the right lung lesion with continued observation of the left lung lesion. I did review again with radiation oncology who are not sure that SBRT could be done. If not, we will consider weekly concurrent carboplatin/ paclitaxel with standard thoracic radiation. Radiation oncology has scheduled a biopsy of the right lung lesion considering recent bronchoscopy failed to yield any malignant cells in thoracic lymph nodes. We will additionally draw blood today for circulating free DNA analysis.

## 2022-08-02 ENCOUNTER — Other Ambulatory Visit: Payer: Self-pay | Admitting: Physician Assistant

## 2022-08-02 NOTE — Telephone Encounter (Signed)
Last OV: 05/26/22  Next OV: none scheduled  Prev Filled by historical provider  December 2022  Called and spoke with patient and she is done with treatments for two weeks now. Patient states she was prescribed 4 a day and went back to one a day so she had plenty but ran out. Please advise. Pt is scheduling appt to come in after her appts next couple weeks. States in previous appts maybe discussed Lyrica but would rather stick to Gabapentin for now.

## 2022-08-03 ENCOUNTER — Ambulatory Visit: Admit: 2022-08-03 | Payer: BLUE CROSS/BLUE SHIELD | Attending: Anesthesiology | Primary: Internal Medicine

## 2022-08-03 DIAGNOSIS — R918 Other nonspecific abnormal finding of lung field: Secondary | ICD-10-CM

## 2022-08-04 ENCOUNTER — Other Ambulatory Visit (HOSPITAL_COMMUNITY): Payer: Self-pay

## 2022-08-04 ENCOUNTER — Encounter: Admit: 2022-08-04 | Payer: PRIVATE HEALTH INSURANCE | Attending: Diagnostic Radiology | Primary: Internal Medicine

## 2022-08-04 ENCOUNTER — Inpatient Hospital Stay
Admit: 2022-08-04 | Discharge: 2022-08-04 | Payer: BLUE CROSS/BLUE SHIELD | Attending: Diagnostic Radiology | Primary: Internal Medicine

## 2022-08-04 ENCOUNTER — Ambulatory Visit: Admit: 2022-08-04 | Payer: BLUE CROSS/BLUE SHIELD | Primary: Internal Medicine

## 2022-08-04 ENCOUNTER — Ambulatory Visit: Admit: 2022-08-04 | Payer: BLUE CROSS/BLUE SHIELD | Attending: Anesthesiology | Primary: Internal Medicine

## 2022-08-04 DIAGNOSIS — Z801 Family history of malignant neoplasm of trachea, bronchus and lung: Secondary | ICD-10-CM

## 2022-08-04 DIAGNOSIS — C3411 Malignant neoplasm of upper lobe, right bronchus or lung: Secondary | ICD-10-CM

## 2022-08-04 DIAGNOSIS — F17210 Nicotine dependence, cigarettes, uncomplicated: Secondary | ICD-10-CM

## 2022-08-04 DIAGNOSIS — Z880 Allergy status to penicillin: Secondary | ICD-10-CM

## 2022-08-04 DIAGNOSIS — J449 Chronic obstructive pulmonary disease, unspecified: Secondary | ICD-10-CM

## 2022-08-04 DIAGNOSIS — Z79899 Other long term (current) drug therapy: Secondary | ICD-10-CM

## 2022-08-04 DIAGNOSIS — K529 Noninfective gastroenteritis and colitis, unspecified: Secondary | ICD-10-CM

## 2022-08-04 DIAGNOSIS — Z720 Tobacco use: Secondary | ICD-10-CM

## 2022-08-04 DIAGNOSIS — R918 Other nonspecific abnormal finding of lung field: Secondary | ICD-10-CM

## 2022-08-04 MED ORDER — PROPOFOL 10 MG/ML INTRAVENOUS EMULSION
10 mg/mL | INTRAVENOUS | Status: DC | PRN
Start: 2022-08-04 — End: 2022-08-04
  Administered 2022-08-04: 17:00:00 10 mL/h via INTRAVENOUS

## 2022-08-04 MED ORDER — LIDOCAINE (PF) 20 MG/ML (2 %) INJECTION SOLUTION
202 mg/mL (2 %) | INTRAVENOUS | Status: DC | PRN
Start: 2022-08-04 — End: 2022-08-04
  Administered 2022-08-04 (×2): 20 mg/mL (2 %) via INTRAVENOUS

## 2022-08-04 MED ORDER — LACTATED RINGERS INTRAVENOUS SOLUTION
INTRAVENOUS | Status: DC | PRN
Start: 2022-08-04 — End: 2022-08-04
  Administered 2022-08-04: 17:00:00 via INTRAVENOUS

## 2022-08-04 MED ORDER — FENTANYL (PF) 50 MCG/ML INJECTION SOLUTION
50 mcg/mL | INTRAVENOUS | Status: DC | PRN
Start: 2022-08-04 — End: 2022-08-04
  Administered 2022-08-04 (×3): 50 mcg/mL via INTRAVENOUS

## 2022-08-04 MED ORDER — FENTANYL (PF) 50 MCG/ML INJECTION SOLUTION
50 mcg/mL | Status: CP
Start: 2022-08-04 — End: ?

## 2022-08-04 MED ORDER — VITAMIN B COMPLEX TABLET
Freq: Every day | ORAL | Status: AC
Start: 2022-08-04 — End: ?

## 2022-08-04 MED ORDER — CALCIUM WITH VITAMIN D3 ORAL
Freq: Every day | ORAL | Status: AC
Start: 2022-08-04 — End: ?

## 2022-08-04 MED ORDER — EPHEDRINE SULFATE 5 MG/ML INTRAVENOUS SOLUTION
5 mg/mL | INTRAVENOUS | Status: DC | PRN
Start: 2022-08-04 — End: 2022-08-04
  Administered 2022-08-04: 18:00:00 5 mg/mL via INTRAVENOUS

## 2022-08-04 NOTE — Procedures
[facility]scBrief Procedure Note:Patient name: Courtney Knox NAT:[id number] Date of procedure: 5/9/2024Procedure(s): [place] guided right posterior upper lobe lung mass biopsy/aspirationSedation method: Monitored Anesthesia CarePre Procedure Diagnosis:right upper lobe lung massPost Procedure Diagnosis:mass with fluidIndications: diagnosticPrimary Attending: Surgeon(s) and Role:   Volney American, DO - PrimaryAssistant: None- unless otherwise specifiedAnesthesia: None- unless otherwise specifiedSpecimen(s) removed:fragmented 18g core samples and 8ml cloudy serosanguineous fluid  Additional studies ordered: surg path, cytology, cultureDrains: None- unless otherwise specifiedEstimated Blood Loss: Minimal- unless otherwise specifiedComplications: None- unless otherwise specifiedVascular closure method: NAFindings/Notes/Comments:Posterior right upper lobe pleural based mass localized with [place] imaging. Several passes with an 18g core core biopsy device were made with only fragmented pieces of tissue obtained, placed in formalin and sent to pathology. Cloudy serosanguineous fluid was noted from the needle hub and a total of 8ml were aspirated. Samples were sent for cytology and culture. No PTX on post biopsy [place] imaging and decrease in size of the posterior right uppe lung mass noted. Pt tolerated procedure well. Plan:CXR in 1-2 hours. Verbal Orders and Supervisory Attestation: As the procedural physician / APP, I verify I was present throughout the entire procedure and provided physician orders as documented in the specialty electronic documentation system. Furthermore I directly supervised all non physicians providers who assisted me during the procedure and I accept delegation of supervisory responsibility for all post procedure patient care activity related to this patient that I request from a [facility] PA/APRN/staff member.Volney American, DO5/9/20241:55 PM

## 2022-08-04 NOTE — Plan of Care
Plan of Care Overview/ Patient Status    Problem: Procedural Patient Plan of CareGoal: Plan of Care ReviewOutcome: Interventions implemented as appropriateGoal: Patient-Specific Goal (Individualized)Outcome: Interventions implemented as appropriateGoal: Absence of Hospital-Acquired Illness or InjuryOutcome: Interventions implemented as appropriateGoal: Optimal Comfort and WellbeingOutcome: Interventions implemented as appropriateGoal: Readiness for Transition of CareOutcome: Interventions implemented as appropriateGoal: Recovery from Sedation EffectsOutcome: Interventions implemented as appropriate Problem: Procedural Patient Plan of CareGoal: Plan of Care ReviewOutcome: Interventions implemented as appropriateGoal: Patient-Specific Goal (Individualized)Outcome: Interventions implemented as appropriateGoal: Absence of Hospital-Acquired Illness or InjuryOutcome: Interventions implemented as appropriateGoal: Optimal Comfort and WellbeingOutcome: Interventions implemented as appropriateGoal: Readiness for Transition of CareOutcome: Interventions implemented as appropriateGoal: Recovery from Sedation EffectsOutcome: Interventions implemented as appropriate

## 2022-08-04 NOTE — Other
HVC HANDOFF REPORTTo view medication administration, contrast administration,vital signs, and procedure details:Procedure documented in: Epic: see flowsheets, eMAR and Procedure log: (To access Procedure Log: go to chart review - imaging - double click on appropriate procedure - click on Procedure Log on right side of window)Procedural Nurse/Contact Number: Tacey Ruiz RN MHB: [phone]     Performing Provider/Contact Number:  Attending: MarinoProcedure performed: R lung bxAdditional comments: Was procedure done with anesthesia Support? Yes (Please refer to anesthesia report in Epic: to review anesthesia documentation, go to chart review, encounters and open the correlating anesthesia event)Was procedure performed with moderate sedation: NoProcedural Access Site: Non-Vascular: R lung See Epic documentation for LDAsElectronically signed:Keyarah Mcroy Dahlia Client 08/04/2022

## 2022-08-04 NOTE — Anesthesia Post-Procedure Evaluation
Anesthesia Post-op NotePatient: Courtney JohnsonProcedure(s):  Procedure(s) (LRB):IR [place] GUIDED BIOPSY LUNG/MEDIASTINUM (Right) Last Vitals:  I have reviewed the post-operative vital signs as noted in the Epic chart.POSTOP EVALUATION:      Patient Recovery Location:  Procedure Room     Vital Signs Status:  Stable     Patient Participation:  Patient participated     Mental Status:  Awake, alert and oriented     Respiratory Status:  Acceptable     Airway Patency:  Patent     Cardiovascular/Hydration Status:  Stable     Pain Management:  Satisfactory to patient     Nausea/Vomiting Status:  Satisfactory to patientThere were no known notable events for this encounter.

## 2022-08-04 NOTE — Other
Post Anesthesia Transfer of Care NotePatient: Courtney JohnsonProcedure(s) Performed: Procedure(s) (LRB):IR [place] GUIDED BIOPSY LUNG/MEDIASTINUM (Right)Last Vitals: I have reviewed the post-operative vital signs during the handoff as noted in the Epic chart.POSTOP HANDOFF :      Patient Location:  Procedure Room     [place] Assessment (score of 8 or greater qualifies pt for phase II location):         Activity:  2 - able to move 4 extremities voluntarily or on command        Respiratory:  2 - able to breathe deeply and cough freely        Circulatory:  2 - blood pressure +/- 20% of pre-anesthetic level        Consciousness:  2 - fully awake        O2 Saturation:  2 - able to maintain oxygen saturation > 92% on room air             TOTAL:  10     Level of Consciousness:  Awake, alert and oriented     VS stable since last recorded intra-op set? Yes       Oxygen source: room airPatient co-morbidities, intra-operative course, intake & output and antibiotics as per Anesthesia record were discussed with the RN.

## 2022-08-04 NOTE — Discharge Instructions
Lung BiopsyPROCEDURE A lung biopsy is a procedure that involves obtaining a small piece of lung tissue, which is then analyzed in the laboratory. Lung biopsy may be recommended to diagnose a changes in the lung such as a lesion, nodule or mass.  It can occur that the samples taken do not correlate with the clinical problem and a repeat biopsy may be neccessary. ACTIVITYNo driving, operating heavy machinery, or making important decisions for the rest of the day. Take it easy for the rest of the day. No strenuous activity (e.g. running, heavy lifting). You may experience some right shoulder discomfort, this is normal. You may resume regular activities tomorrow.DIET/MEDICATIONSYou may resume your usual diet and medications. You may resume any blood thinning medications (anticoagulants) the day after the procedure.  CARE INSTRUCTIONSYou may remove the dressing over your incision tomorrow and replace it with a Band-Aid if necessary. You may shower tomorrow. FOLLOW-UPYour referring physician will notify you of your biopsy result. You should contact your IR physician if you experience any of the following:Severe pain at the biopsy site, abdomen, or shoulderChest pain or shortness of breathNew or worsening abdominal painFever of greater than 100.4?FThere is bleeding for the site that does not stop with pressureThere is pus or redness around the insertion siteCONTACT INFORMATIONIf you have any questions or problems, please call: Interventional Radiology at [phone] ([phone]).NEW or INCREASING SHORTNESS OF BREATH SHOULD CONSIDERED AN EMERGENCY-CALL 911 FOR IMMEDIATE PARAMEDIC RESPONSE OR GO DIRECTLY TO THE NEAREST EMERGENCY ROOM

## 2022-08-04 NOTE — Other
Patient present to IR today for [place] guided right lung mass biopsy, I have reassessed the patient by examination and review of relevant data pertaining to the planned procedure. I have verified the planned procedure and there are no relevant changes since the History & Physical performed on 07/19/22. Heart: RRRLungs: [place] normal, clear

## 2022-08-04 NOTE — Other
HVC HANDOFF REPORTTo view medication administration, contrast administration,vital signs, and procedure details:Procedure documented in: Epic: see flowsheets, eMAR and Procedure log: (To access Procedure Log: go to chart review - imaging - double click on appropriate procedure - click on Procedure Log on right side of window)Verbal Report given to: 45 Procedural Nurse/Contact Number: [phone]     Performing Provider/Contact Number: [phone] Family contact information: See EpicProcedure performed: Renal BiospyAdditional comments: Was procedure done with anesthesia Support? No Last Vital Signs:Heart Rate: 60Rhythm: SRB/P: 122/89B/P Method: automatic Respiratory Rate: 8  O2 Sat.: 95%Pain Level/ Intervention: 0/10LOC and/or current Mental Status: A = O x 4 Was procedure performed with moderate sedation: Yes: see procedural documentation for medications administeredPatient transferring on Oxygen: NoProcedural Access Site: Non-Vascular: posterior right lateral back  See Epic documentation for LDAsElectronically signed:Cheri Ayotte, RN 08/04/2022, 1:49 PM

## 2022-08-04 NOTE — Other
Operative Diagnosis:Pre-op:   Lung mass Patient Coded Diagnosis   Pre-op diagnosis: Lung mass  Post-op diagnosis: Lung mass  Patient Diagnosis   Pre-op diagnosis: Lung mass  Post-op diagnosis:     Post-op diagnosis:   * Lung mass [R51.8]Operative Procedure(s) :Procedure(s) (LRB):IR [place] GUIDED BIOPSY LUNG/MEDIASTINUM (Right)Post-op Procedure & Diagnosis ConfirmationPost-op Diagnosis: Post-op Diagnosis updated (see notes)     - Lung massPost-op Procedure: Post-op Procedure updated (see notes)     - IR [place] GUIDED BIOPSY LUNG/MEDIASTINUM

## 2022-08-04 NOTE — Anesthesia Pre-Procedure Evaluation
This is a 67 y.o. female scheduled for IR [place] GUIDED BIOPSY LUNG/MEDIASTINUM (Right).Review of Systems/ Medical HistoryPatient summary, EKG/Cardiac Studies , Labs, pre-procedure vitals, height, weight and NPO status reviewed.No previous anesthesia concernsAnesthesia Evaluation:   No history of anesthetic complications  Estimated body mass index is 15.96 kg/m? as calculated from the following:  Height as of this encounter: 5' 4 (1.626 m).  Weight as of this encounter: 42.2 kg. CC/HPI: 81 F for [place] guided lung/mediastinum biopsyBCCA skin faceCompression fracture thoracic vertebraLung massCurrent home meds: Vit B complexWellbutrinCalcium with vit D3Past Surgical History:  Bronch/EBUS 4/24/24Cardiovascular:  Patient has no history of orthopnea, palpitations or syncope.   No dyspnea. -Vascular Disease:  Negative    Respiratory:      -Nicotine Dependence: yes-Airway Infections: The patient had a no recent URI  -Lung Disorders:      -COPD:  yes (Bronchiectasis)-Comments: PET [place] Skull to Thigh Area Initial - 4/20/24Impression1. Intensely hypermetabolic right upper lobe pleural-based lung mass with chest wall and right 5-6th ribs invasion, highly suspicious for malignancy. Further correlation with tissue sampling.2. Biapical hypermetabolic scarring with more prominent on the left apical nodularity, nonspecific but concerning for malignant involvement. Clinical correlation.3. Mildly hypermetabolic small subcarinal node is nonspecific and could be reactive although metastasis not excluded.[place] CHEST WO IV CONTRAST Date: 07/01/2022 6:33 PMFINDINGS:?Lungs/Airways/Pleura: Right upper lobe mass measures 51 x 33 mm in maximal axial dimension and 49 mm in craniocaudal dimension measured on the sagittal images (4:08). 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 There is severe scarring with volume loss and bronchiectasis in the upper lobes extending to the apices associated pleural fluid and pleural thickening and calcification.  The lungs are hyperaerated with severe flattening of the diaphragms and increased AP diameter of the chest.  No pneumothorax. Mediastinum/Lymph nodes: No enlarged lymph nodes.Heart and Vessels:  Normal heart size.  No pericardial effusion. No thoracic aortic aneurysm.  Mild atherosclerotic calcifications of the coronary arteries.Visualized Upper Abdomen: Severe upper abdominal vascular calcifications.Bones and Soft Tissues: Severe compression deformity at T12. Cachexia.IMPRESSION:Right upper lobe mass with chest wall invasion.HEENT: Negative.Neuromuscular: NegativeSkeletal/Skin:  Negative-Joint and Skeletal Disorders: osteoporosisGastrointestinal/Genitourinary: -Hepatic Disorders:  She has hepatitis (antibody positive). Hepatitis type: C.-Nutritional Disorders: Pt is cachectic per BMI definition, (BMI <20).Hematological/Lymphatic: -Anemia:  Patient does not have anemia.-Coagulopathy:  She has no thrombocytopenia.Behavioral/Social/Psychiatric & Syndromes: Negative Patient has depression (Wellbutrin).Additional Findings: Lab Results     Component                Value               Date                     WBC                      14.2 (H)            06/25/2022               HGB                      14.5                06/25/2022               HCT  44.60               06/25/2022               PLT                      294                 06/25/2022               ALT                      34                  06/25/2022               AST                      44 (H)              06/25/2022               NA                       136                 06/25/2022 K                        4.5                 06/25/2022               CL                       101                 06/25/2022               CREATININE               0.47                06/25/2022               BUN                      15                  06/25/2022               CO2                      26                  06/25/2022               GLU                      87                  07/16/2022               HGBA1C                   5.6                 06/02/2022  TSH                      2.090               06/02/2022               INR                      1.1                 07/19/2022          Physical ExamCardiovascular:    normal exam  Rhythm: regularHeart Sounds: S1 present and S2 present.Pulmonary:    rhonchi and decreased breath sounds  No findings of wheezing or rales on physical exam.Airway:  Mallampati: ITM distance: >3 FBNeck ROM: fullMouth Opening: >3cmDental:  Dentition: missingOther Findings: Barrel-chested, cachecticNot in resp distressNo pedal edemaAnesthesia PlanASA 3 The primary anesthesia plan is  MAC. TIVA-MAC/GA with propofol, SASAMPerioperative Code Status confirmed: It is my understanding that the patient is currently designated as 'Full Code' and will remain so throughout the perioperative period.Anesthesia informed consent obtained. Consent obtained from: patientUse of blood products: consented    Consent Comment: Anesthetic plan discussed with the patient in detail. He/She understands the benefits & risks of anesthesia, and he/she consented to the planned anesthetic. Questions answered and all concerns addressedAnesthesiologist's Pre Op NoteI personally evaluated and examined the patient prior to the intra-operative phase of care on the day of the procedure.Marland Kitchen

## 2022-08-05 ENCOUNTER — Ambulatory Visit
Admit: 2022-08-05 | Payer: BLUE CROSS/BLUE SHIELD | Attending: Student in an Organized Health Care Education/Training Program | Primary: Internal Medicine

## 2022-08-05 ENCOUNTER — Encounter
Admit: 2022-08-05 | Payer: PRIVATE HEALTH INSURANCE | Attending: Student in an Organized Health Care Education/Training Program | Primary: Internal Medicine

## 2022-08-05 DIAGNOSIS — J449 Chronic obstructive pulmonary disease, unspecified: Secondary | ICD-10-CM

## 2022-08-05 DIAGNOSIS — S22000A Wedge compression fracture of unspecified thoracic vertebra, initial encounter for closed fracture: Secondary | ICD-10-CM

## 2022-08-05 DIAGNOSIS — K529 Noninfective gastroenteritis and colitis, unspecified: Secondary | ICD-10-CM

## 2022-08-05 DIAGNOSIS — Z720 Tobacco use: Secondary | ICD-10-CM

## 2022-08-05 NOTE — Progress Notes
Patient name: 	 Kynadee Dam MR:	 [id number] Date of birth:	 [birth year]Date of visit:	 5/10/2024Provider:	 Feather Berrie Chales Abrahams, MDHistory of present illness:Ms. Slates is a 67 y.o. female who presents today in follow-up with a chief complaint of T12 closed fracture (subsequent encounter)Last appointments with [place]: 4/12/24Our documentation 07/08/22:When suffered fracture: 2008 when she fell from a ladderNoted to have right lung mass on [place] [facility], for which she saw Thoracic Surgery and has been given referral to Medical Oncology and RadOnc, and plan for EBUS biopsyOsteoporosis treatments: Calcium and D3, previously tried Fosamax but did not tolerate.The patient, with a history of thoracic spine fracture in 2008, presents with persistent back pain. The pain, initially localized to the right rib area, has since radiated to the mid to lower thoracic spine and around the right side of the torso. The patient denies any pain radiating to the front of the body. The pain is aggravated by certain activities, particularly those involving the use of their fingers. The patient also reports numbness in the toes and fingers, but denies any weakness in the legs or any loss of sensation in the thighs, genital area, or rectal area.The patient has been managing the pain with naproxen and has not sought any other treatments for the back pain. They have a history of physical therapy for the back pain, but this was several years ago. The patient also reports taking calcium and vitamin D supplements for osteoporosis, as advised by their primary care provider.The patient also mentions a history of colitis and recent Mohs surgery for a skin condition. They were prescribed OxyContin for post-operative pain, of which they took a few doses. The patient also reports a weight restriction of 25 pounds due to their back condition.Last Oncology visit 07/26/22:Ms. Gilden appears well. Maintains an ECOG performance status of one. Reports persistent right lateral chest comfort over the last several months. Cough worsened after recent bronchoscopy with some hemoptysis, which has resolved. No worsening dyspnea on exertion. Appetite and energy fair.Imaging reviewed at recent thoracic oncology tumor board where consensus was T3N0 right lung cancer with questionable process in the left, inflammatory/infectious vs. unrelated second primary lung cancer. Recommendation of Tumor Board was to pursue stereotactic radiosurgery if feasible to the right lung lesion with continued observation of the left lung lesion. I did review again with radiation oncology who are not sure that SBRT could be done. If not, we will consider weekly concurrent carboplatin/ paclitaxel with standard thoracic radiation. Radiation oncology has scheduled a biopsy of the right lung lesion considering recent bronchoscopy failed to yield any malignant cells in thoracic lymph nodes. We will additionally draw blood today for circulating free DNA analysis.-Interim:-Had IR lung biopsy yesterday-Pain: minimal back pain but does have some pain at biopsy site-PT and Endocrine: still working on getting these appointments. Has not taken Fosamax againReview of Systems:Constitutional: Negative for chills, fatigue, fever and unexpected weight change. HENT: Negative for hearing loss, tinnitus, trouble swallowing and voice change.  Eyes: Negative for photophobia and visual disturbance. Respiratory: Negative for cough and shortness of breath.  Cardiovascular: Negative for chest pain and leg swelling. Gastrointestinal: Negative for nausea and vomiting. Endocrine: Negative for cold intolerance and heat intolerance. Musculoskeletal: Positive for back pain and neck pain as above. Negative for joint pain, stiffness, or joint swelling.Skin: Negative for wound, rash or other lesions.Allergic/Immunologic: Negative for immunocompromised state.Neurological: Positive for feet numbness and fingers pain as above. Negative for dizziness, tremors, seizures, syncope, facial asymmetry, speech difficulty, light-headedness and headaches. Hematological: Does not bruise/bleed easily. Psychiatric/Behavioral: Negative for  confusion, sleep disturbance or anxiety.Exam: Ms. Keys vital signs are: BP 122/78  - Pulse 78  - Temp 97.8 ?F (36.6 ?C) (Oral)  - SpO2 96% General: alert, oriented, no acute distressHEENT: NCATChest: Normal respiratory effort, no distressSkin: no obvious lesions or rashesAbdomen: Non-tender non-distended abdomen MSK/SPINENo evidence of prior surgical incisionsTenderness to palpation: none over spineStands unassisted with appropriate sagittal and coronal balance, slightly accentuated thoracic kyphosisPain with cervical flexion/extension, lateral bending, rotation: nonePain with thoracolumbar flexion/extension, lateral bending, rotation: none NEURO:CN: PERRL, EOMI, FS, SSS, TML. No tongue fasciculationsTone: WNL x4Strength: 5/5 x4Atrophy: none obviousSensation to light touch: grossly intact x4Reflexes: 2+ through bilat bicep, BR, tri, patella & achillesProprioception: grossly intact in bilat great toes & thumbsStraight-leg raise: neg bilatSpurling: neg bilatTinel: neg bilat wrists & cubital tunnels & lat fem headsGait: grossly WNL casual, tandem, heel & toeNegative Hoffman'sNegative Romberg'sNo evidence of clonusLhermitte: negRapid finger tap: WNL bilatNo dysdiadochokinesiaImaging interpreted by me at today's visit:*MRI entire spine w/wo contrast 07/29/22: no obvious metastases. T12 fracture appears chronic. No obvious high-grade canal stenosis*MRI brain w/wo contrast 07/09/22: no obvious metastasesImaging from prior visits or interpreted separately:*Entire spine standing scoli AP/Lat x-rays 07/08/2022: T12 fracture grossly stable from prior. Otherwise grossly preserved global sag and cor alignment. Diffuse bone density loss*Thoracic MRI without contrast 06/18/22: chronic-appearing T12 burst fracture with minimal retropulsion, canal patent, no obvious posterior elements or ligamentous involvement*Chest [place] noncon 07/01/22: right upper lobe mass 5.1cm. T12 chronic burst fracture with minimal retropulsion or extension into posterior elements*DEXA 06/06/22: lowest T-score -4.1 at L fem neckImpression and plan:Ms. Lagan is a pleasant 67 y.o. female with 40 pack-year active smoking and new right lung mass:#T12 burst fracture: chronic. Likely traumatic + osteoporotic in nature, no evidence of neoplastic involvement of spine from thoracic/cervical MRI#Osteoporosis: only on Ca and Vit D3. Has not taken Fosamax for some time#Right lung mass: Thoracic advised against surgery due to low lung volumes from emphysema, referred to Oncology and RadOnc. EBUS biopsy performed and non-diagnostic, subsequently had IR needle biopsy on 08/04/22#Tobacco dependence: identified increased risk factors from my H&P based on patient being a longstanding active smoker. I discussed the benefits of quitting smoking and provided resources for smoking cessation programs. I encouraged the patient to set a quit date and provided support through the process. The patient elected to defer this for now. I have discussed the pathophysiology, natural history, return precautions and treatment options extensively at today's visit.-Continue follow-up with Thoracic, Oncology & RadOnc. Follow up right lung mass biopsy. No contraindication from our standpoint to pursuing chemotherapy, radiation etc. No brace or specific activity restrictions from our standpoint; may mobilize as tolerated. Surveillance/staging imaging per Oncology-Endocrine and PT referrals already in place. Provided patient with information on scheduling these appointments-RTC 8 weeks, will repeat XR at that time---Electronically signed VH:QIONG Manual Meier.D.Attending/Spine Surgeon, Neurological SurgeryAssistant Professor, [employer]---

## 2022-08-09 ENCOUNTER — Encounter: Admit: 2022-08-09 | Payer: PRIVATE HEALTH INSURANCE | Attending: Hematology | Primary: Internal Medicine

## 2022-08-09 ENCOUNTER — Ambulatory Visit: Admit: 2022-08-09 | Payer: BLUE CROSS/BLUE SHIELD | Attending: Hematology | Primary: Internal Medicine

## 2022-08-09 DIAGNOSIS — K529 Noninfective gastroenteritis and colitis, unspecified: Secondary | ICD-10-CM

## 2022-08-09 DIAGNOSIS — C3490 Malignant neoplasm of unspecified part of unspecified bronchus or lung: Secondary | ICD-10-CM

## 2022-08-09 DIAGNOSIS — J449 Chronic obstructive pulmonary disease, unspecified: Secondary | ICD-10-CM

## 2022-08-09 DIAGNOSIS — Z720 Tobacco use: Secondary | ICD-10-CM

## 2022-08-09 LAB — BODY FLUID CULTURE     (BH GH L LMW YH)
BKR BODY FLUID CULTURE: NO GROWTH x 1000/??L (ref 0.00–1.00)
BKR GRAM STAIN (ABNORMAL): NONE SEEN
BKR WAM BASOPHIL ABSOLUTE COUNT.: NONE SEEN x 1000/??L (ref 0.00–1.00)

## 2022-08-11 ENCOUNTER — Telehealth: Admit: 2022-08-11 | Payer: PRIVATE HEALTH INSURANCE | Attending: Adult Health | Primary: Internal Medicine

## 2022-08-11 ENCOUNTER — Telehealth: Admit: 2022-08-11 | Payer: PRIVATE HEALTH INSURANCE | Attending: Hematology | Primary: Internal Medicine

## 2022-08-11 MED ORDER — PROCHLORPERAZINE MALEATE 10 MG TABLET
10 mg | ORAL_TABLET | Freq: Four times a day (QID) | ORAL | 2 refills | Status: AC | PRN
Start: 2022-08-11 — End: ?

## 2022-08-11 NOTE — Telephone Encounter
Spoke w/ Courtney Knox who reported she spoke w/ GI, Dr. Leana Roe who sent her to get blood work done today at [facility] for her Hep C. Patient is also being sent for Abdominal/ Liver US at [facility] tomorrow and asked Dr. Alvera Singh to forward [place] the information. Patient reported that she did not want to wait until 5/24 to be seen by [facility] provider, although she did report that she was offered an appt tomorrow, but this was in [facility] so she declined. Patient will have all information forwarded to Dr. Maxcine Ham to review prior to her start of treatment.

## 2022-08-11 NOTE — Telephone Encounter
Returned pt call and she explained she is going to see another provider for her Hepatitis C . She is already scheduled for labs today and ultra sound through Dr. Valentina Shaggy . She would like [place] to cancel her 5/24 app with Kizzie Ide . I explained I would cancel her app for her . Pt appreciative of call back

## 2022-08-11 NOTE — Other
START ON PATHWAY REGIMEN - Non-Small Cell Lung    Paclitaxel     Carboplatin **Always confirm dose/schedule in your pharmacy ordering system**Patient Characteristics:Preoperative or Nonsurgical Candidate (Clinical Staging), Stage III - Nonsurgical Candidate (Nonsquamous and Squamous), PS = 0, 1Therapeutic Status: Preoperative or Nonsurgical Candidate (Clinical Staging)AJCC T Category: cTXAJCC N Category: cNXAJCC M Category: cM0AJCC 8 Stage Grouping: IIIAECOG Performance Status: 1Intent of Therapy:Curative Intent, Discussed with Patient

## 2022-08-11 NOTE — Telephone Encounter
Received call from [place] she stated she isn't waiting until the end of the month to get her liver test done she is going to [facility] today to receive labs ordered by one of her other providers. She is requesting a call back from nursing

## 2022-08-11 NOTE — Telephone Encounter
Patient is calling regarding 08/19/22 new patient appointment with Kizzie Ide, states that she spoke with Tobi Bastos yesterday. She would like to know who ordered this appointment for her, states she is having bloodwork done today at [facility] ordered by [facility]. She would like to know if this appointment was requested by Dr.Gettinger or Dr.Hayman. States she will not keep this appointment until she speaks with Tobi Bastos, please return call to [phone]

## 2022-08-12 ENCOUNTER — Inpatient Hospital Stay: Admit: 2022-08-12 | Discharge: 2022-08-12 | Payer: BLUE CROSS/BLUE SHIELD | Primary: Internal Medicine

## 2022-08-12 ENCOUNTER — Encounter: Admit: 2022-08-12 | Payer: PRIVATE HEALTH INSURANCE | Attending: Radiation Oncology | Primary: Internal Medicine

## 2022-08-12 DIAGNOSIS — Z720 Tobacco use: Secondary | ICD-10-CM

## 2022-08-12 DIAGNOSIS — C3491 Malignant neoplasm of unspecified part of right bronchus or lung: Secondary | ICD-10-CM

## 2022-08-12 DIAGNOSIS — J449 Chronic obstructive pulmonary disease, unspecified: Secondary | ICD-10-CM

## 2022-08-12 DIAGNOSIS — K529 Noninfective gastroenteritis and colitis, unspecified: Secondary | ICD-10-CM

## 2022-08-12 MED ORDER — EPINEPHRINE 0.3 MG/0.3 ML INJECTION, AUTO-INJECTOR
0.30.3 mg/ mL | Freq: Once | INTRAMUSCULAR | Status: AC | PRN
Start: 2022-08-12 — End: 2022-08-17

## 2022-08-12 MED ORDER — SODIUM CHLORIDE 0.9 % (FLUSH) INJECTION SYRINGE
0.9 % | Freq: Three times a day (TID) | INTRAVENOUS | Status: AC
Start: 2022-08-12 — End: 2022-08-17

## 2022-08-12 MED ORDER — ALBUTEROL SULFATE 2.5 MG/3 ML (0.083 %) SOLUTION FOR NEBULIZATION
2.530.083 mg /3 mL (0.083 %) | Freq: Once | RESPIRATORY_TRACT | Status: AC | PRN
Start: 2022-08-12 — End: 2022-08-17

## 2022-08-12 MED ORDER — SODIUM CHLORIDE 0.9 % (FLUSH) INJECTION SYRINGE
0.9 % | INTRAVENOUS | Status: AC | PRN
Start: 2022-08-12 — End: 2022-08-17

## 2022-08-12 MED ORDER — METHYLPREDNISOLONE 125 MG/2 ML INJECTION
Freq: Once | INTRAVENOUS | Status: AC | PRN
Start: 2022-08-12 — End: 2022-08-17

## 2022-08-12 MED ORDER — DIPHENHYDRAMINE 50 MG/ML INJECTION (WRAPPED E-RX)
50 mg/mL | Freq: Once | INTRAVENOUS | Status: AC | PRN
Start: 2022-08-12 — End: 2022-08-17

## 2022-08-12 MED ORDER — IOHEXOL 350 MG IODINE/ML INTRAVENOUS SOLUTION
350 mg iodine/mL | Freq: Once | INTRAVENOUS | Status: CP
Start: 2022-08-12 — End: ?
  Administered 2022-08-12: 19:00:00 350 mL via INTRAVENOUS

## 2022-08-12 NOTE — Progress Notes
Re: Courtney Knox (22-May-1955)MRN: [id number] Provider: Jaclyn Prime, MDDate of service: 5/17/2024[facility] CenterDepartment of Therapeutic Radiology RADIATION ONCOLOGY CONSULTATION REPORTREASON FOR VISIT: ConsultIDENTIFICATION/CHIEF COMPLAINT: Courtney Knox is a 67 y.o. female active smoker (~50 pack-years) with a history of COPD and a now biopsy-proven NSCLC (keratinizing SCC) of the RUL with invasion of the posterior chest wall and ribs 5-6 (PD-L1 TPS 5%, at least cT3N0). She additionally has lesion in the LEFT lung currently under surveillance. She has been referred by Belva Agee, MD for opinion/consultation and consideration of radiation therapy.HISTORY OF PRESENT ILLNESS: Courtney Knox was previously seen in our department on 07/22/2022.  For the initial details of her HPI, please refer to our note on that date.Shortly thereafter, following a multidisciplinary tumor board discussion, it was recommended that her LEFT lung lesion undergo continued surveillance for now, and it was also recommended that she undergo a [place] guided biopsy of the lesion in her RIGHT lung.  This was eventually accomplished on 08/04/2022.07/16/2022 - Seen and evaluated by [name].  07/29/2022 - Patient underwent MR total spine imaging with impression as follows:1. No evidence of metastatic disease in the spine.2. Chronic severe T12 compression fracture with focal kyphotic deformity. No evidence of underlying marrow replacing lesion.3. Degenerative changes, as described, with mild spinal canal stenosis and severe left neural foraminal narrowing at C5-C6.08/04/2022 - Patient underwent [place] guided biopsy of her RIGHT lung lesion while under anesthesia revealing NSCLC (SCC, PD-L1 TPS 5%).08/09/2022 - Seen and evaluated by [name].  No new concerns.Today, she is presenting to clinic alone.  She reports feeling well overall.  She had some pain at her biopsy site immediately after her procedure but this has since resolved.  She has some discomfort in her RIGHT back.  She denied any difficulty breathing more than usual.  Her weight has been stable.  She notes some postprandial muscle spasms in her abdomen.Wt: 08/12/22 45.2 kg 08/09/22 44.2 kg 08/04/22 42.2 kg 07/26/22 42.4 kg 07/22/22 46.7 kg 07/21/22 44.7 kg RADIATION THERAPY HISTORY: 1.) No history of collagen vascular disease, inflammatory bowel disease, or pacemaker placement. 2.) No prior history of radiation therapy.PAST MEDICAL HISTORY: Past Medical History: Diagnosis Date  Colitis   COPD (chronic obstructive pulmonary disease) (HC Code)   Tobacco abuse  PAST SURGICAL HISTORY: No past surgical history on file.ALLERGIES: PenicillinMEDICATIONS: Current Outpatient Medications:   b complex vitamins, 1 tablet, Oral, Daily  buPROPion XL, 150 mg, Oral, QAM (Patient not taking: Reported on 08/04/2022)  calcium carbonate/vitamin D3 (CALCIUM WITH VITAMIN D3 ORAL), Take by mouth daily.SOCIAL HISTORY: She  reports that she has been smoking cigarettes. She started smoking about 51 years ago. She has a 51.4 pack-year smoking history. She has never used smokeless tobacco. She reports that she does not currently use alcohol. She reports that she does not use drugs.FAMILY HISTORY: Her family history includes Cancer in her maternal aunt; Lung cancer in her father; Lymphoma in her brother.RELEVANT IMAGING: Relevant imaging reviewed. RELEVANT PATHOLOGY:Relevant pathology reviewed. RELEVANT LABS:  Relevant labs reviewed. REVIEW OF SYSTEMS:A comprehensive 12-point review of systems (including constitutional, eyes, HEENT, cardiovascular, respiratory, gastrointestinal, genitourinary, musculoskeletal, neurologic, psychiatric, hematologic, skin) was queried and is otherwise negative.PHYSICAL EXAM:BP 114/76  - Pulse 82  - Temp 97.9 ?F (36.6 ?C) (Temporal)  - Resp 18  - Wt 45.2 kg  - SpO2 96%  - BMI 17.10 kg/m? ECOG Performance Status: 1 Pain Score:   5 (pt complains of discomfort)General: Well-appearing female in no  apparent distress.  HEENT: Normocephalic, atraumatic.  No conjunctival injection.  Anicteric sclera. Extra-ocular movements grossly intact.  Vision is grossly intact.  Tongue is freely mobile, midline.  Oropharynx is grossly clear to visual inspection with moist mucous membranes.Respiratory: Diffusely diminished breath sounds auscultated bilaterally.  Patent airway.  No signs of respiratory distress. On room air.Cardiac: RRR, normal S1/S2. No cyanosis.Lymphatic survey: No palpable cervical, submandibular, submental, or supraclavicular adenopathy.Abdomen: Non-distended.  Extremities: Moves all extremities spontaneously.Neurologic: Alert.  Oriented to person, place, and time.  Hearing is grossly intact.  Affect appropriate.  Face symmetric.  Speech fluent.  No obvious focal deficit.  Ambulatory.  Gait not assessed. Skin: Warm, dry.  No jaundice.  ASSESSMENT / PLAN: Courtney Knox is a 67 y.o. female active smoker (~50 pack-years) with a history of COPD and a now biopsy-proven NSCLC (keratinizing SCC) of the RUL with invasion of the posterior chest wall and ribs 5-6 (PD-L1 TPS 5%, at least cT3N0). She additionally has lesion in the LEFT lung currently under surveillance. We had a long discussion today with the patient about the diagnosis, natural history of the disease, work-up and treatment thus far. We discussed the role of radiotherapy in the setting of her disease process. It was emphasized that the primary intent is to improve local control and reduce the risk of local recurrence.  Treatment will involve external beam radiotherapy +/- concurrent systemic therapy.We discussed the logistics of external beam radiotherapy including the need for [place] simulation followed by daily (M-F) fractionated treatments for approximately 7 weeks.We discussed the possible adverse effects of radiation therapy, which can include but are not limited to fatigue; dermatitis; cytopenia; pneumonitis; esophagitis; hemorrhage; necrosis; fistula; perforation; stricture; fibrosis; rib fracture; chest wall pain; nausea; vomiting; hypothyroidism; brachial plexopathy; cardiac toxicity; and development of secondary cancers.Ms. Arbogast asked appropriate questions and voiced understanding about the procedure, logistics, alternatives, expected benefits, and possible side effects. She has agreed to undergo treatment and informed consent was subsequently obtained. [place] simulation will immediately follow this appointment today with treatment planned to start shortly thereafter once her plan has undergone QA. Ms. Hernandez was encouraged to contact us in the interim should she have any questions or concerns.Herold Harms, MDResident Physician, PGY-2Department of Therapeutic RadiologyMHB: [phone]: Belva Agee, MDATTENDING ADDENDUM: I have personally seen and evaluated Rodena Medin with Dr. Simonne Maffucci. I have personally reviewed the patient's prior records and all available imaging noted in the HPI. I have incorporated my edits and agree with the above documented history, physical examination, assessment, and plan.  In brief, Ms. Thirza Pellicano is a 67 y.o. 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 Please see our original note for full history.I presented the case at our multi-D thoracic tumor board. The consensus was to proceed with definitive RT to the cT3N0 NSCLC of the RUL and to follow the LUL lesion. Should this progress we will pursue ION bronch guided biopsy of this lesion. 07/21/22 EBUS: level 7, 11L, and 11R all negative for malignant cells (positive for polymorphous lymphocytes).08/04/22 [place] guided RUL biopsy: squamous cell carcinoma. PD-L1 TPS 5%. Today she notes continued right posterior chest wall pain. No changes in breathing. A/P; The patient has a cT3N0 of the RUL with significant chest wall invasion and invasion of ribs. This is causing her pain and as such  we will plan to proceed with [place] sim today to avoid further delay and expedite RT planning as RT should help with pain. Given significant chest wall invasion I would not recommend SBRT and would rather proceed with conventionally fractionated RT with chemotherapy. I have discussed this with Dr. Maxcine Ham and he agrees with this plan. We will follow the LUL area. Consent signed . [place] sim today. Alphonzo Dublin, MD, PhDYale Radiation OncologyMay 17, 20243:17 PM

## 2022-08-16 ENCOUNTER — Inpatient Hospital Stay: Payer: Medicare Other | Admitting: Oncology

## 2022-08-16 ENCOUNTER — Inpatient Hospital Stay: Payer: Medicare Other | Attending: Oncology

## 2022-08-16 ENCOUNTER — Telehealth: Payer: Self-pay | Admitting: Radiation Oncology

## 2022-08-16 VITALS — BP 94/68 | HR 100 | Temp 98.2°F | Resp 18 | Ht 67.0 in | Wt 163.8 lb

## 2022-08-16 DIAGNOSIS — R197 Diarrhea, unspecified: Secondary | ICD-10-CM | POA: Diagnosis not present

## 2022-08-16 DIAGNOSIS — Z853 Personal history of malignant neoplasm of breast: Secondary | ICD-10-CM | POA: Diagnosis not present

## 2022-08-16 DIAGNOSIS — C210 Malignant neoplasm of anus, unspecified: Secondary | ICD-10-CM

## 2022-08-16 LAB — CBC WITH DIFFERENTIAL (CANCER CENTER ONLY)
Abs Immature Granulocytes: 0.04 10*3/uL (ref 0.00–0.07)
Basophils Absolute: 0 10*3/uL (ref 0.0–0.1)
Basophils Relative: 0 %
Eosinophils Absolute: 0.1 10*3/uL (ref 0.0–0.5)
Eosinophils Relative: 1 %
HCT: 37.3 % (ref 36.0–46.0)
Hemoglobin: 12.4 g/dL (ref 12.0–15.0)
Immature Granulocytes: 1 %
Lymphocytes Relative: 14 %
Lymphs Abs: 0.7 10*3/uL (ref 0.7–4.0)
MCH: 32 pg (ref 26.0–34.0)
MCHC: 33.2 g/dL (ref 30.0–36.0)
MCV: 96.4 fL (ref 80.0–100.0)
Monocytes Absolute: 0.7 10*3/uL (ref 0.1–1.0)
Monocytes Relative: 15 %
Neutro Abs: 3.3 10*3/uL (ref 1.7–7.7)
Neutrophils Relative %: 69 %
Platelet Count: 206 10*3/uL (ref 150–400)
RBC: 3.87 MIL/uL (ref 3.87–5.11)
RDW: 17.2 % — ABNORMAL HIGH (ref 11.5–15.5)
WBC Count: 4.8 10*3/uL (ref 4.0–10.5)
nRBC: 0 % (ref 0.0–0.2)

## 2022-08-16 NOTE — Progress Notes (Signed)
[facility] OFFICE PROGRESS NOTE   Diagnosis: Anal cancer  INTERVAL HISTORY:   Alyssa Deleon returns as scheduled.  Skin changes at the groin and perineum have improved.  She continues to have multiple loose stools per day.  These are variable in volume.  She is taking Imodium, up to 4/day.  She is not eating due to the fear of diarrhea.  Objective:  Vital signs in last 24 hours:  Blood pressure 94/68, pulse 100, temperature 98.2 F (36.8 C), temperature source Tympanic, resp. rate 18, height 5\' 7"  (1.702 m), weight 163 lb 12.8 oz (74.3 kg), SpO2 98 %.    HEENT: No thrush or ulcers Resp: Lungs clear bilaterally Cardio: Regular rate and rhythm GI: No hepatosplenomegaly, mild tenderness in the right mid abdomen, no mass Vascular: No leg edema Skin: Radiation hyperpigmentation at the groin and perineum, no skin breakdown Rectal: External exam reveals no skin breakdown at the anal margin, firm hemorrhoid at the left anal margin  Lab Results:  Lab Results  Component Value Date   WBC 4.8 08/16/2022   HGB 12.4 08/16/2022   HCT 37.3 08/16/2022   MCV 96.4 08/16/2022   PLT 206 08/16/2022   NEUTROABS 3.3 08/16/2022    CMP  Lab Results  Component Value Date   NA 142 07/18/2022   K 4.3 07/18/2022   CL 105 07/18/2022   CO2 27 07/18/2022   GLUCOSE 94 07/18/2022   BUN 9 07/18/2022   CREATININE 0.63 07/18/2022   CALCIUM 9.0 07/18/2022   PROT 6.6 07/18/2022   ALBUMIN 4.0 07/18/2022   AST 12 (L) 07/18/2022   ALT 8 07/18/2022   ALKPHOS 46 07/18/2022   BILITOT 0.7 07/18/2022   GFRNONAA >60 07/18/2022    Medications: I have reviewed the patient's current medications.   Assessment/Plan:  Anal cancer Anal mass noted on digital exam and colonoscopy 05/09/2022-biopsy consistent with squamous cell carcinoma CTs 05/12/2022-no evidence of thoracic metastatic disease, calcified right pleural plaques, asymmetric thickening of the right posterior wall of the distal  rectum/anal canal, no evidence of metastatic adenopathy in the abdomen or pelvis, 2 small hypodense left liver lesions favored to represent benign cysts PET scan 05/30/2022-intense hypermetabolic activity at the anus.  No evidence of metastatic adenopathy.  Indeterminate small, mildly hypermetabolic lesion in the lateral segment of the left hepatic lobe. Radiation 06/06/2022-07/19/2022 Cycle one 5-FU/Mitomycin-C 06/06/2022 cycle two 5-FU/Mitomycin-C 07/04/2022 (5-FU dose reduced secondary to diarrhea) MRI liver 06/15/2022-1 cm hypervascular mass in segment 2 of the left hepatic lobe, corresponds to the focus of hypermetabolism on recent PET-CT.  Imaging characteristics atypical for metastatic squamous cell carcinoma.  Follow-up MRI in 3 months Cycle two 5-FU/Mitomycin-C 07/04/2022 (5-FU dose reduced secondary to diarrhea Depression Left breast cancer 1995 Recurrent left breast cancer 2003?  Status post a left mastectomy and TRAM Family history of multiple cancers she reports being negative for a BRCA mutation   Disposition: Alyssa Deleon completed treatment for anal cancer 1 month ago.  Radiation skin changes have improved.  She continues to have diarrhea, likely secondary to radiation.  I recommended she increase the Imodium up to 8/day.  She will add Lomotil.  She will contact us if these maneuvers did not help.  I encouraged her to increase her diet as tolerated.  She will return for an office and lab visit in 3 weeks.  She is scheduled to see Dr. Maisie Fus for restaging of the anal tumor.  [name], MD  08/16/2022  10:16 AM

## 2022-08-16 NOTE — Telephone Encounter (Signed)
I called the patient to find out more about her bowel habits. We were notified that she was struggling with diarrhea despite lomotil and imodium. I had to leave a VM asking her to call us back.

## 2022-08-18 ENCOUNTER — Telehealth: Admit: 2022-08-18 | Payer: PRIVATE HEALTH INSURANCE | Primary: Internal Medicine

## 2022-08-18 DIAGNOSIS — C3491 Malignant neoplasm of unspecified part of right bronchus or lung: Secondary | ICD-10-CM

## 2022-08-18 NOTE — Telephone Encounter
Radiation Oncology Phone Call DocumentationMs. Courtney Knox is a 67 y.o. female active smoker (~50 pack-years) and patient of Dr. 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 She additionally has a lesion in the LEFT lung currently under surveillance. She is planned for ChemoRT to her RIGHT-sided lesion. During radiation treatment planning, a suspicious, mildly FDG avid RIGHT SCV lymph node was noted.  This was reviewed with radiology who agreed that it appeared suspicious.  In light of this, we are recommending a biopsy of this lesion to confirm whether or not it is involved in her malignant process.  This would affect the details of her radiotherapy plan.I called Courtney Knox around 1:35 PM on 5/23.  We connected for a few seconds, but service was poor and I was unable to make out what she was saying.  I told her I would call her right back, at which time there was no answer and I was redirected to voicemail.  I attempted to reconnect two or three times before leaving a voicemail requesting a call back.  I included my direct phone number and indicated that the issue is time-sensitive.Will alert Dr. Valora Corporal.Herold Harms, MDResident Physician, PGY-2Department of Therapeutic RadiologyMHB: [phone]:  Courtney Knox called me back at roughly 1:56 PM.I relayed the recommendation for an FNA biopsy of this lymph node.  She was agreeable to this; she requested general anesthesia for this procedure and I informed her this is not typical practice.  She remained agreeable to the procedure.  I will provide the patient with a referral to [facility] for an FNA of this RIGHT-sided SCV node and will arrange for it to be faxed over today.All questions answered to apparent satisfaction.  Will alert Dr. Valora Corporal.Herold Harms, MDResident Physician, PGY-2Department of Therapeutic RadiologyMHB: [phone]

## 2022-08-19 ENCOUNTER — Telehealth: Payer: Self-pay | Admitting: Physician Assistant

## 2022-08-19 ENCOUNTER — Other Ambulatory Visit: Payer: Self-pay | Admitting: Physician Assistant

## 2022-08-19 ENCOUNTER — Encounter: Admit: 2022-08-19 | Payer: BLUE CROSS/BLUE SHIELD | Attending: Adult Health | Primary: Internal Medicine

## 2022-08-19 ENCOUNTER — Encounter: Admit: 2022-08-19 | Payer: PRIVATE HEALTH INSURANCE | Attending: Gastroenterology | Primary: Internal Medicine

## 2022-08-19 MED ORDER — TRAZODONE HCL 150 MG PO TABS: 150.0000 mg | ORAL_TABLET | Freq: Every day | ORAL | 1 refills | Status: DC

## 2022-08-19 NOTE — Telephone Encounter (Signed)
Pt notified and advised Rx sent to prescription

## 2022-08-19 NOTE — Telephone Encounter (Signed)
Patient states she has been taking 2 Trazone tablets per day (states she thinks she needs to take 3 tablets per day). Patient requests new RX with increased dosage for  traZODone (DESYREL) 50 MG tablet Be sent to:  [id number] - [place], [place] - [address] AT [address] OF [facility] [address]

## 2022-08-19 NOTE — Telephone Encounter (Signed)
Please see pt msg and advise 

## 2022-08-23 ENCOUNTER — Other Ambulatory Visit (HOSPITAL_COMMUNITY): Payer: Self-pay

## 2022-08-23 ENCOUNTER — Telehealth: Admit: 2022-08-23 | Payer: PRIVATE HEALTH INSURANCE | Attending: Hematology | Primary: Internal Medicine

## 2022-08-23 ENCOUNTER — Telehealth: Admit: 2022-08-23 | Payer: PRIVATE HEALTH INSURANCE | Primary: Internal Medicine

## 2022-08-23 ENCOUNTER — Ambulatory Visit: Admit: 2022-08-23 | Payer: BLUE CROSS/BLUE SHIELD | Attending: Hematology | Primary: Internal Medicine

## 2022-08-23 DIAGNOSIS — C3490 Malignant neoplasm of unspecified part of unspecified bronchus or lung: Secondary | ICD-10-CM

## 2022-08-23 NOTE — Telephone Encounter
Received call from:  [name] -  would like to go over resultsPt would like to know these results for treatment planResults for recent test: recent labs and USTest was completed at: 5/16 for labs and 5/24 for the USTest was completed on: [facility] / [facility]ADBest telephone number for call back: [phone]

## 2022-08-23 NOTE — Telephone Encounter
Radiation Oncology Call Documentation Received a call from [name].  She is a 67 y.o. female active smoker (~50 pack-years) and patient of Dr. 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 She additionally has a lesion in the LEFT lung currently under surveillance. She is planned for ChemoRT to her RIGHT-sided lesion.  During radiation treatment planning, a suspicious, mildly FDG avid RIGHT SCV lymph node was noted. This was reviewed with radiology who agreed that it appeared suspicious. In light of this, we are recommending a biopsy of this lesion to confirm whether or not it is involved in her malignant process. This would affect the details of her radiotherapy plan. As documented on 5/23, we discussed this with the patient and referred her to [facility] and [facility] for an FNA of this lesion.She calls to report that she has yet to hear from [facility] regarding the scheduling of her biopsy. I told the patient I would call them directly to prompt scheduling.  I did so immediately after hanging up with [name] and left a message with the practice requesting a call back.All questions answered to apparent satisfaction. Will alert Dr Valora Corporal. Herold Harms, MDPGY-2, Radiation OncologyDepartment of Therapeutic RadiologyYale School of Medicine

## 2022-08-24 ENCOUNTER — Telehealth: Admit: 2022-08-24 | Payer: PRIVATE HEALTH INSURANCE | Primary: Internal Medicine

## 2022-08-24 ENCOUNTER — Inpatient Hospital Stay: Admit: 2022-08-24 | Payer: PRIVATE HEALTH INSURANCE | Primary: Internal Medicine

## 2022-08-24 ENCOUNTER — Encounter
Admit: 2022-08-24 | Payer: PRIVATE HEALTH INSURANCE | Attending: Vascular and Interventional Radiology | Primary: Internal Medicine

## 2022-08-24 ENCOUNTER — Encounter: Admit: 2022-08-24 | Payer: PRIVATE HEALTH INSURANCE | Attending: Radiation Oncology | Primary: Internal Medicine

## 2022-08-24 DIAGNOSIS — C3491 Malignant neoplasm of unspecified part of right bronchus or lung: Secondary | ICD-10-CM

## 2022-08-24 NOTE — Telephone Encounter
Radiation Oncology Phone CallI called Ms. Rodena Medin today.  She is a 67 y.o. female active smoker (~50 pack-years) and patient of Dr. 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 She additionally has a lesion in the LEFT lung currently under surveillance. She is planned for ChemoRT to her RIGHT-sided lesion.  During radiation treatment planning, a suspicious, mildly FDG avid RIGHT SCV lymph node was noted. This was reviewed with radiology who agreed that it appeared suspicious. In light of this, we are recommending a biopsy of this lesion to confirm whether or not it is involved in her malignant process. This would affect the details of her radiotherapy plan. As documented on 5/23 and 5/28, we discussed this with the patient and referred her to [facility] and [facility] for an FNA of this lesion, but as of yesterday [name] has yet to hear from [place] and [place] regarding scheduling.  Our department called their office yesterday and they are apparently working through a backlog of referrals after the long weekend.I called [name] today to ask if she has heard from [place] and [place] yet.  I also called to let her know that we placed another order for this procedure to be done here at [facility], and that we'd like her to get it done at whatever location can give her a sooner appointment. Unfortunately, [name] did not answer the phone.  I left a voicemail encouraging her to accept the soonest appointment she is offered from either of these offices.  I asked her to call me back when she gets my message.Will discuss with Dr. Valora Corporal.Herold Harms, MDResident Physician, PGY-2Department of Therapeutic RadiologyMHB: [phone]

## 2022-08-25 ENCOUNTER — Other Ambulatory Visit: Payer: Self-pay

## 2022-08-25 ENCOUNTER — Other Ambulatory Visit: Payer: Self-pay | Admitting: Physician Assistant

## 2022-08-25 ENCOUNTER — Other Ambulatory Visit (HOSPITAL_COMMUNITY): Payer: Self-pay

## 2022-08-25 ENCOUNTER — Encounter: Admit: 2022-08-25 | Payer: PRIVATE HEALTH INSURANCE | Primary: Internal Medicine

## 2022-08-25 ENCOUNTER — Telehealth: Admit: 2022-08-25 | Payer: PRIVATE HEALTH INSURANCE | Primary: Internal Medicine

## 2022-08-25 MED ORDER — PROLIA 60 MG/ML ~~LOC~~ SOSY
60.0000 mg | PREFILLED_SYRINGE | Freq: Once | SUBCUTANEOUS | 0 refills | Status: AC
  Filled 2022-09-05: qty 1, 1d supply, fill #0
  Filled 2022-09-05 – 2022-09-09 (×2): qty 1, 180d supply, fill #0
  Filled ????-??-??: fill #0

## 2022-08-25 NOTE — Telephone Encounter
Radiation Oncology Phone CallI called Ms. Rodena Medin today.  She is a 67 y.o. female active smoker (~50 pack-years) and patient of Dr. 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 She additionally has a lesion in the LEFT lung currently under surveillance. She is planned for ChemoRT to her RIGHT-sided lesion.  During radiation treatment planning, a suspicious, mildly FDG avid RIGHT SCV lymph node was noted. This was reviewed with radiology who agreed that it appeared suspicious. In light of this, we are recommending a biopsy of this lesion to confirm whether or not it is involved in her malignant process. This would affect the details of her radiotherapy plan. As documented on 5/23 and 5/28, we discussed this with the patient and referred her to [facility] and [facility] for an FNA of this lesion.I called [name] and asked if she has heard from [place] and [place] yet.  She has not.  I reiterated the plan I left on her voicemail yesterday she she expressed her understanding.I called [facility] shortly after I spoke with [name] to request expedited scheduling.  They did not answer the phone and I left a message for them as I previously did on Tuesday, 5/28.I asked [name] to call me back tomorrow at lunchime if she does not have this procedure scheduled with either office before then.All questions answered.  Will alert Dr. Valora Corporal.  Herold Harms, MDResident Physician, PGY-2Department of Therapeutic RadiologyMHB: [phone]: [name] called me back just before 2:15 PM on 5/30 to let me know she [facility] and was offered appointments on 6/18 and 6/20; she took the 6/20 one thinking she might be scheduled for Chemotherapy on 6/18.I asked [name] to call them back to see if she can get moved to 6/18 instead.  I explained that we ideally can't start chemotherapy and radiation until this biopsy is done.  She expressed an understanding and will call me back if there are any issues.Herold Harms, MDResident Physician, PGY-2Department of Therapeutic RadiologyMHB: [phone]

## 2022-08-25 NOTE — Progress Notes
INITIAL STUDIES:06/25/2022 [place] A/P showed findings concerning for colitis predominant involvement of the right colon and cecum.07/01/2022 [place] chest showed RIGHT upper lobe mass with chest wall invasion. Mass measures 5.1 x 3.3 x 4.9 cm. This mass erodes into the posterior chest wall with adjacent erosion of the RIGHT posterior fifth and sixth ribs.07/06/2022 FEV1 37% predicted, FEV1/FVC 35%, DLCO 20%. Severe obstruction, severely impaired diffusing capacity.07/09/2022 MRI brain with no evidence of metastatic disease to the brain.07/16/2022 FDG PET/[employer] showed intensely hypermetabolic RIGHT upper lobe pleural based lung mass with chest wall and right 5-6th ribs invasion. Biapical hypermetabolic scarring with more prominent on the LEFT apical nodularity, nonspecific but concerning for malignant involvement.07/21/22 Path penAccession #: [id number]        1) LYMPH NODE, 11L, FINE NEEDLE ASPIRATION/WASH:           - NEGATIVE FOR MALIGNANT CELLS      - POLYMORPHOUS POPULATION OF LYMPHOCYTES. 2) LYMPH NODE, STATION 7, FINE NEEDLE ASPIRATION/WASH:           - NEGATIVE FOR MALIGNANT CELLS      - POLYMORPHOUS POPULATION OF LYMPHOCYTES. 3) LYMPH NODE, 11R, FINE NEEDLE ASPIRATION/WASH:           - NEGATIVE FOR MALIGNANT CELLS      - POLYMORPHOUS POPULATION OF LYMPHOCYTES. PAST MEDICAL/ SURGICAL HISTORY:  Colitis, COPD (chronic obstructive pulmonary disease), osteoporosis. Left wrist surgery. 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 Continues to smoke with roughly 40 pack year history.FAMILY HISTORY:  Father, lung cancer; brother, lymphomaMEDICATIONS:  Medication list reviewedALLERGIES:  PCNREVIEW OF SYSTEMS:  As described below and in chart.  Remaining review of systems is negative.Spoke with Ms. Dotterweich by phone. No new or worsening complaints. Scheduled to see gastroenterology tomorrow with tentative plan to initiate EPCLUSA for hepatitis C. Also being scheduled for ultrasound guided biopsy of supraclavicular lymph node which was appreciated during [place] simulation for radiation. She will return to clinic to initiate concurrent chemotherapy/ radiation on June 4.TELEPHONE VISIT: For this visit the clinician and patient were present via telephone (audio only).Patient counseled on available options for visit type; Patient elected telephone visit (audio only); Patient consent given for telephone (audio only) visit : YesState patient is located in: CTThe clinician is appropriately licensed in the above state to provide care for this visitPatient Identity was confirmed during this call.  Other individuals actively participating in the telephone encounter and their name/relation to the patient: noneTotal time spent in medical telephone (audio only) discussion: 11 minBecause this visit was completed over telephone, a hands-on physical exam was not performed.  Patient understands and knows to call back if condition changes.

## 2022-08-26 ENCOUNTER — Encounter: Admit: 2022-08-26 | Payer: PRIVATE HEALTH INSURANCE | Primary: Internal Medicine

## 2022-08-26 ENCOUNTER — Telehealth: Admit: 2022-08-26 | Payer: PRIVATE HEALTH INSURANCE | Primary: Internal Medicine

## 2022-08-26 DIAGNOSIS — C3491 Malignant neoplasm of unspecified part of right bronchus or lung: Secondary | ICD-10-CM

## 2022-08-26 NOTE — Other
5/31 12:35 PM Per order IR ultrasound guided biopsy of right SCV lymph node. ~1-1.5cm in short axis. Seen on PET/[place] from July 16, 2022 (image 58). It is immediately superior to lung apex on the patients right. This is mildly FDG avid with SUVm ~2.5-3 and has contrast enhancment on [place] simulation scan. for Staging and treatment determination.. U/S guided biospy of this lesion is likely not possible, [place] guided or combined +U/S might be possible. If they would like [place] to try please have them allow IR to choose the imaging modality to perform the Bx. TRS. Case reviewed and approved by Dr. Simonne Maffucci in agreement with this plan as recommended by Dr. Helaine Chess. Diamantina Providence, RN

## 2022-08-26 NOTE — Other
Courtney Medin DEBBIEPreferred Name: Courtney Knox y.o.Female, 1957/12/06Last Weight: 45.2 kgPhone: [phone]: Biopsy RequestReceived: Ina Homes, MD  Diamantina Providence, RN; Haywood Pao; Jerrilyn Cairo, MD; Jamal Maes, [facility] Michelle,Thank you so much for your timely reply!  Yes, it's definitely okay for this biopsy to be [place]-guided with [place] on hand if that would be more appropriate.  Please feel free to call or email me with any other questions or issues.Herold Harms, MDResident Physician, PGY-2Department of Therapeutic RadiologyMHB: [phone]    Previous Messages ----- Message -----From: Diamantina Providence, RNSent: 08/26/2022  11:59 AM EDTTo: Wardell Heath; Jamal Maes, MD; *Subject: Biopsy Request                              Good morning Dr. Valora Corporal and Dr. Teressa Lower. Schlachter reviewed all of the available imaging for this patient and he reports the following;U/S guided biospy of this lesion is likely not possible, [place] guided or combined [place]+U/S might be possible. If they would like [place] to try please have them allow IR to choose the imaging modality to perform the Bx..May we move forward and schedule this patient for a [place] guided (with [place] available in the room) Biopsy of right SCV lymph node. ~1-1.5cm in short axis. Seen on PET/[place] from 07/16/2022 (image 58). It is immediately superior to lung apex on the patients right. This is mildly FDG avid with SUVm ~2.5-3 and has contrast enhancment on [place] simulation scan for this patient?Thank you.MicheleMichele Sofie Hartigan, RN 08/26/2022 12:28 PM

## 2022-08-27 NOTE — Telephone Encounter
Radiation Oncology Phone Call I called [name] today.  She is a 67 y.o. female active smoker (~50 pack-years) and patient of Dr. 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 She additionally has a lesion in the LEFT lung currently under surveillance. She is planned for ChemoRT to her RIGHT-sided lesion.  During radiation treatment planning, a suspicious, mildly FDG avid RIGHT SCV lymph node was noted. This was reviewed with radiology who agreed that it appeared suspicious. In light of this, we are recommending a biopsy of this lesion to confirm whether or not it is involved in her malignant process. This would affect the details of her radiotherapy plan. As documented on 5/23 and 5/28, we discussed this with the patient and referred her to [facility] and [facility] for an FNA of this lesion.  She has since been scheduled at the next available appointment at [facility], 6/18.  Waiting for this appointment would unfortunately delay the definitive management of [name]'s cancer.[facility] was able to schedule [name] for a significantly sooner appointment on the AM of 6/11.  I called her to let her know of this appointment at 7:00 AM on 6/11 at  [facility], and asked her to not eat or drink anything after midnight the evening prior.  I also asked [name] to go to any [place] lab and get the bloodwork I ordered for this biopsy drawn at her convenience before 6/11.  She was agreeable to this.I told [name] that someone from IR should call her the day prior to discuss logistics in more detail.I asked [name] to keep her [facility] appointment on the books in case they are able to offer her an FNA before 6/11.  We agreed to call them and cancel that appointment only after [facility] successful completion of the biopsy.All questions answered to apparent satisfaction. Will alert Dr. Valora Corporal.Herold Harms, MDResident Physician, PGY-2Department of Therapeutic RadiologyMHB: [phone]

## 2022-08-29 ENCOUNTER — Telehealth: Admit: 2022-08-29 | Payer: PRIVATE HEALTH INSURANCE | Attending: Hematology | Primary: Internal Medicine

## 2022-08-29 DIAGNOSIS — C3411 Malignant neoplasm of upper lobe, right bronchus or lung: Secondary | ICD-10-CM

## 2022-08-29 DIAGNOSIS — C3490 Malignant neoplasm of unspecified part of unspecified bronchus or lung: Secondary | ICD-10-CM

## 2022-08-29 NOTE — Telephone Encounter
Called patient - she has several appts on the schedule that need to be cancelled as her concurrent chemo/RT has been delayed pending a biopsy of a suspicious LN. Currently the bx is set for 6/11 - will cancel her chemo appts tomorrow. Called rad -onc, asked for them to cancel the RT appts this week, waiting for a call back. Discussed w Dr Maxcine Ham who recommended getting urgent PET scan to further eval the LN this week. If not possible to get PET arranged then will do [place] CAP and  Neck. Orders placed, TOP secretaries to assist with scheduling. Called to alert patient to the above. Still waiting on final decision regarding treatment start date pending scan and biopsy results.

## 2022-08-29 NOTE — Telephone Encounter
Received call from:  Courtney Knox Patient called to clarify her Biopsy scheduled and treatment plan. States she received a call that these dated may change and she is now confused. Best telephone number for call back:  [phone]

## 2022-08-30 ENCOUNTER — Ambulatory Visit: Admit: 2022-08-30 | Payer: BLUE CROSS/BLUE SHIELD | Primary: Internal Medicine

## 2022-08-30 ENCOUNTER — Telehealth: Admit: 2022-08-30 | Payer: PRIVATE HEALTH INSURANCE | Attending: Hematology | Primary: Internal Medicine

## 2022-08-30 ENCOUNTER — Ambulatory Visit: Admit: 2022-08-30 | Payer: BLUE CROSS/BLUE SHIELD | Attending: Hematology | Primary: Internal Medicine

## 2022-08-30 DIAGNOSIS — C3490 Malignant neoplasm of unspecified part of unspecified bronchus or lung: Secondary | ICD-10-CM

## 2022-08-30 NOTE — Telephone Encounter
Received call from:  Darlyn Read office upset regarding bill she received from [facility]. States she refuses to pay bill. Patient states she will not be responsible for bill, and does not understand why MD ordered test. Best telephone number for call back: [phone]

## 2022-08-30 NOTE — Telephone Encounter
Courtney Knox calling back.  She is all set w the [facility] billing. She spoke directly w them.  Says no need to call her back.

## 2022-08-31 ENCOUNTER — Ambulatory Visit: Admit: 2022-08-31 | Payer: BLUE CROSS/BLUE SHIELD | Primary: Internal Medicine

## 2022-09-01 ENCOUNTER — Encounter: Admit: 2022-09-01 | Payer: PRIVATE HEALTH INSURANCE | Attending: Adult Health | Primary: Internal Medicine

## 2022-09-01 ENCOUNTER — Ambulatory Visit: Admit: 2022-09-01 | Payer: BLUE CROSS/BLUE SHIELD | Primary: Internal Medicine

## 2022-09-01 ENCOUNTER — Inpatient Hospital Stay: Admit: 2022-09-01 | Discharge: 2022-09-01 | Payer: BLUE CROSS/BLUE SHIELD | Primary: Internal Medicine

## 2022-09-01 ENCOUNTER — Telehealth: Admit: 2022-09-01 | Payer: PRIVATE HEALTH INSURANCE | Attending: Adult Health | Primary: Internal Medicine

## 2022-09-01 DIAGNOSIS — C3411 Malignant neoplasm of upper lobe, right bronchus or lung: Secondary | ICD-10-CM

## 2022-09-01 DIAGNOSIS — Z5111 Encounter for antineoplastic chemotherapy: Secondary | ICD-10-CM

## 2022-09-01 DIAGNOSIS — Z510 Encounter for antineoplastic radiation therapy: Secondary | ICD-10-CM

## 2022-09-01 DIAGNOSIS — C3490 Malignant neoplasm of unspecified part of unspecified bronchus or lung: Secondary | ICD-10-CM

## 2022-09-01 MED ORDER — IOHEXOL 350 MG IODINE/ML INTRAVENOUS SOLUTION
350 mg iodine/mL | Freq: Once | INTRAVENOUS | Status: CP | PRN
Start: 2022-09-01 — End: ?
  Administered 2022-09-01: 12:00:00 350 mL via INTRAVENOUS

## 2022-09-01 MED ORDER — SODIUM CHLORIDE 0.9 % LARGE VOLUME SYRINGE FOR AUTOINJECTOR
Freq: Once | INTRAVENOUS | Status: CP | PRN
Start: 2022-09-01 — End: ?
  Administered 2022-09-01: 12:00:00 via INTRAVENOUS

## 2022-09-01 NOTE — Telephone Encounter
Called patient to let her know that Dr Maxcine Ham and Dr Valora Corporal looked at her [place] results from this morning. Her case will be presented at TB on Monday AM - will most likely not need a biopsy of the [facility] LN, and will proceed with treatment on Tuesday as planned. In the event a biopsy is recommended, will leave the bx appointment scheduled for now, also on Tuesday 6/11.Will cancel either the bx or treatment appts pending TB discussion. Updated patient on the above info, will touch base on Monday with a final plan for Tuesday.

## 2022-09-02 ENCOUNTER — Ambulatory Visit: Admit: 2022-09-02 | Payer: BLUE CROSS/BLUE SHIELD | Primary: Internal Medicine

## 2022-09-02 ENCOUNTER — Encounter: Admit: 2022-09-02 | Payer: PRIVATE HEALTH INSURANCE | Primary: Internal Medicine

## 2022-09-02 ENCOUNTER — Encounter: Admit: 2022-09-02 | Payer: PRIVATE HEALTH INSURANCE | Attending: Radiation Oncology | Primary: Internal Medicine

## 2022-09-02 ENCOUNTER — Ambulatory Visit: Admit: 2022-09-02 | Payer: BLUE CROSS/BLUE SHIELD | Attending: Radiation Oncology | Primary: Internal Medicine

## 2022-09-02 DIAGNOSIS — Z5111 Encounter for antineoplastic chemotherapy: Secondary | ICD-10-CM

## 2022-09-02 DIAGNOSIS — C3491 Malignant neoplasm of unspecified part of right bronchus or lung: Secondary | ICD-10-CM

## 2022-09-02 DIAGNOSIS — Z510 Encounter for antineoplastic radiation therapy: Secondary | ICD-10-CM

## 2022-09-05 ENCOUNTER — Other Ambulatory Visit (HOSPITAL_COMMUNITY): Payer: Self-pay

## 2022-09-05 ENCOUNTER — Telehealth: Admit: 2022-09-05 | Payer: PRIVATE HEALTH INSURANCE | Primary: Internal Medicine

## 2022-09-05 ENCOUNTER — Encounter: Admit: 2022-09-05 | Payer: PRIVATE HEALTH INSURANCE | Primary: Internal Medicine

## 2022-09-05 ENCOUNTER — Ambulatory Visit: Admit: 2022-09-05 | Payer: BLUE CROSS/BLUE SHIELD | Primary: Internal Medicine

## 2022-09-05 DIAGNOSIS — Z5111 Encounter for antineoplastic chemotherapy: Secondary | ICD-10-CM

## 2022-09-05 DIAGNOSIS — Z510 Encounter for antineoplastic radiation therapy: Secondary | ICD-10-CM

## 2022-09-05 NOTE — Telephone Encounter
Radiation Oncology Phone Call I called Courtney Knox today.  She is a 67 y.o. female active smoker (~50 pack-years) and patient of Dr. 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 She additionally has a lesion in the LEFT lung currently under surveillance. She is planned for ChemoRT to her RIGHT-sided lesion.  During radiation treatment planning, a suspicious, mildly FDG avid RIGHT SCV lymph node was noted. After tumor board discussion today (6/10) it was decided that this lymph node can be treated empirically as involved and that additional tissue was not necessary.  We have canceled her IR-guided FNA scheduled for 6/11 and also a US-guided FNA tentatively scheduled at [facility] for 6/18.I called [name] today to relay the tumor board's recommendations and that her planned biopsy has been canceled.  She is agreeable to start ChemoRT as scheduled tomorrow (6/11).  I informed [name] to come to [facility] directly from her chemotherapy appointment if possible and to ignore the 6:00 and 6:15 PM appointments in her MyChart.  [name] indicated that she is planning on stopping off at home between Chemotherapy and Radiotherapy but did indicate her intent to come to [facility] shortly thereafter.All questions answered to apparent satisfaction.  Will alert Dr. Valora Corporal.Herold Harms, MDResident Physician, PGY-2Department of Therapeutic RadiologyMHB: [phone]

## 2022-09-05 NOTE — Telephone Encounter
Called patient to discuss plan - No Biopsy needed following discussion at TB.She will come in for treatment tomorrow as planned. Called IR and let them know to cancel the bx appt tomorrow.

## 2022-09-06 ENCOUNTER — Telehealth: Payer: Self-pay

## 2022-09-06 ENCOUNTER — Inpatient Hospital Stay: Payer: Medicare Other | Attending: Oncology

## 2022-09-06 ENCOUNTER — Inpatient Hospital Stay: Payer: Medicare Other | Admitting: Nurse Practitioner

## 2022-09-06 ENCOUNTER — Encounter: Payer: Self-pay | Admitting: Nurse Practitioner

## 2022-09-06 ENCOUNTER — Encounter: Admit: 2022-09-06 | Payer: PRIVATE HEALTH INSURANCE | Primary: Internal Medicine

## 2022-09-06 ENCOUNTER — Inpatient Hospital Stay: Admit: 2022-09-06 | Discharge: 2022-09-06 | Payer: BLUE CROSS/BLUE SHIELD | Primary: Internal Medicine

## 2022-09-06 ENCOUNTER — Ambulatory Visit: Admit: 2022-09-06 | Payer: BLUE CROSS/BLUE SHIELD | Primary: Internal Medicine

## 2022-09-06 ENCOUNTER — Ambulatory Visit: Admit: 2022-09-06 | Payer: BLUE CROSS/BLUE SHIELD | Attending: Hematology | Primary: Internal Medicine

## 2022-09-06 ENCOUNTER — Encounter: Admit: 2022-09-06 | Payer: PRIVATE HEALTH INSURANCE | Attending: Hematology | Primary: Internal Medicine

## 2022-09-06 VITALS — BP 101/72 | HR 100 | Temp 98.1°F | Resp 18 | Ht 67.0 in | Wt 162.9 lb

## 2022-09-06 DIAGNOSIS — Z79899 Other long term (current) drug therapy: Secondary | ICD-10-CM

## 2022-09-06 DIAGNOSIS — J449 Chronic obstructive pulmonary disease, unspecified: Secondary | ICD-10-CM

## 2022-09-06 DIAGNOSIS — Z510 Encounter for antineoplastic radiation therapy: Secondary | ICD-10-CM

## 2022-09-06 DIAGNOSIS — K529 Noninfective gastroenteritis and colitis, unspecified: Secondary | ICD-10-CM

## 2022-09-06 DIAGNOSIS — Z5111 Encounter for antineoplastic chemotherapy: Secondary | ICD-10-CM

## 2022-09-06 DIAGNOSIS — C3490 Malignant neoplasm of unspecified part of unspecified bronchus or lung: Secondary | ICD-10-CM

## 2022-09-06 DIAGNOSIS — Z720 Tobacco use: Secondary | ICD-10-CM

## 2022-09-06 DIAGNOSIS — T451X5A Adverse effect of antineoplastic and immunosuppressive drugs, initial encounter: Secondary | ICD-10-CM | POA: Diagnosis not present

## 2022-09-06 DIAGNOSIS — K769 Liver disease, unspecified: Secondary | ICD-10-CM

## 2022-09-06 DIAGNOSIS — C210 Malignant neoplasm of anus, unspecified: Secondary | ICD-10-CM | POA: Insufficient documentation

## 2022-09-06 DIAGNOSIS — R197 Diarrhea, unspecified: Secondary | ICD-10-CM | POA: Insufficient documentation

## 2022-09-06 LAB — CBC WITH AUTO DIFFERENTIAL
BKR WAM ABSOLUTE IMMATURE GRANULOCYTES.: 0.01 x 1000/ÂµL (ref 0.00–0.30)
BKR WAM ABSOLUTE LYMPHOCYTE COUNT.: 1.67 x 1000/ÂµL (ref 0.60–3.70)
BKR WAM ABSOLUTE NRBC (2 DEC): 0 x 1000/ÂµL (ref 0.00–1.00)
BKR WAM ANALYZER ANC: 3.97 x 1000/ÂµL (ref 2.00–7.60)
BKR WAM BASOPHIL ABSOLUTE COUNT.: 0.05 x 1000/ÂµL (ref 0.00–1.00)
BKR WAM BASOPHILS: 0.8 % (ref 0.0–1.4)
BKR WAM EOSINOPHIL ABSOLUTE COUNT.: 0.31 x 1000/ÂµL (ref 0.00–1.00)
BKR WAM EOSINOPHILS: 4.8 % (ref 0.0–5.0)
BKR WAM HEMATOCRIT (2 DEC): 50.2 % — ABNORMAL HIGH (ref 35.00–45.00)
BKR WAM HEMOGLOBIN: 16 g/dL — ABNORMAL HIGH (ref 11.7–15.5)
BKR WAM IMMATURE GRANULOCYTES: 0.2 % (ref 0.0–1.0)
BKR WAM LYMPHOCYTES: 25.7 % (ref 17.0–50.0)
BKR WAM MCH (PG): 30.5 pg (ref 27.0–33.0)
BKR WAM MCHC: 31.9 g/dL (ref 31.0–36.0)
BKR WAM MCV: 95.6 fL (ref 80.0–100.0)
BKR WAM MONOCYTE ABSOLUTE COUNT.: 0.48 x 1000/ÂµL (ref 0.00–1.00)
BKR WAM MONOCYTES: 7.4 % (ref 4.0–12.0)
BKR WAM MPV: 8.7 fL (ref 8.0–12.0)
BKR WAM NEUTROPHILS: 61.1 % (ref 39.0–72.0)
BKR WAM NUCLEATED RED BLOOD CELLS: 0 % (ref 0.0–1.0)
BKR WAM PLATELETS: 293 x1000/ÂµL (ref 150–420)
BKR WAM RDW-CV: 12.2 % (ref 11.0–15.0)
BKR WAM RED BLOOD CELL COUNT.: 5.25 M/ÂµL (ref 4.00–6.00)
BKR WAM WHITE BLOOD CELL COUNT: 6.5 x1000/ÂµL (ref 4.0–11.0)

## 2022-09-06 LAB — COMPREHENSIVE METABOLIC PANEL
BKR A/G RATIO: 1.1 (ref 1.0–2.2)
BKR ALANINE AMINOTRANSFERASE (ALT): 18 U/L (ref 10–35)
BKR ALBUMIN: 4.4 g/dL (ref 3.6–5.1)
BKR ALKALINE PHOSPHATASE: 69 U/L (ref 9–122)
BKR ANION GAP: 11 (ref 7–17)
BKR ASPARTATE AMINOTRANSFERASE (AST): 36 U/L — ABNORMAL HIGH (ref 10–35)
BKR AST/ALT RATIO: 2
BKR BILIRUBIN TOTAL: 0.4 mg/dL (ref <=1.2)
BKR BLOOD UREA NITROGEN: 25 mg/dL — ABNORMAL HIGH (ref 8–23)
BKR BUN / CREAT RATIO: 45.5 — ABNORMAL HIGH (ref 8.0–23.0)
BKR CALCIUM: 10 mg/dL (ref 8.8–10.2)
BKR CHLORIDE: 102 mmol/L (ref 98–107)
BKR CO2: 30 mmol/L (ref 20–30)
BKR CREATININE: 0.55 mg/dL (ref 0.40–1.30)
BKR EGFR, CREATININE (CKD-EPI 2021): >60 mL/min/{1.73_m2} (ref >=60)
BKR GLOBULIN: 3.9 g/dL (ref 2.0–3.9)
BKR GLUCOSE: 89 mg/dL (ref 70–100)
BKR POTASSIUM: 4.2 mmol/L (ref 3.3–5.3)
BKR PROTEIN TOTAL: 8.3 g/dL (ref 5.9–8.3)
BKR SODIUM: 143 mmol/L (ref 136–144)

## 2022-09-06 LAB — CMP (CANCER CENTER ONLY)
ALT: <5 U/L (ref 0–44)
AST: 9 U/L — ABNORMAL LOW (ref 15–41)
Albumin: 4 g/dL (ref 3.5–5.0)
Alkaline Phosphatase: 58 U/L (ref 38–126)
Anion gap: 7 (ref 5–15)
BUN: 11 mg/dL (ref 8–23)
CO2: 29 mmol/L (ref 22–32)
Calcium: 9.4 mg/dL (ref 8.9–10.3)
Chloride: 104 mmol/L (ref 98–111)
Creatinine: 0.86 mg/dL (ref 0.44–1.00)
GFR, Estimated: >60 mL/min (ref >60)
Glucose, Bld: 107 mg/dL — ABNORMAL HIGH (ref 70–99)
Potassium: 4.9 mmol/L (ref 3.5–5.1)
Sodium: 140 mmol/L (ref 135–145)
Total Bilirubin: 0.6 mg/dL (ref 0.3–1.2)
Total Protein: 6.4 g/dL — ABNORMAL LOW (ref 6.5–8.1)

## 2022-09-06 LAB — MAGNESIUM: Magnesium: 2 mg/dL (ref 1.7–2.4)

## 2022-09-06 MED ORDER — EPINEPHRINE 0.3 MG/0.3 ML INJECTION, AUTO-INJECTOR
0.30.3 mg/ mL | INTRAMUSCULAR | Status: DC | PRN
Start: 2022-09-06 — End: 2022-09-11

## 2022-09-06 MED ORDER — FAMOTIDINE 4 MG/ML IN 0.9% SODIUM CHLORIDE (ADULT)
Freq: Once | INTRAVENOUS | Status: DC | PRN
Start: 2022-09-06 — End: 2022-09-11

## 2022-09-06 MED ORDER — ALBUTEROL SULFATE 2.5 MG/3 ML (0.083 %) SOLUTION FOR NEBULIZATION
2.530.083 mg /3 mL (0.083 %) | RESPIRATORY_TRACT | Status: DC | PRN
Start: 2022-09-06 — End: 2022-09-11

## 2022-09-06 MED ORDER — DEXAMETHASONE SODIUM PHOSPHATE (PF) 10 MG/ML INJECTION SOLUTION
10 mg/mL | Freq: Once | INTRAVENOUS | Status: CP
Start: 2022-09-06 — End: ?
  Administered 2022-09-06: 14:00:00 10 mL via INTRAVENOUS

## 2022-09-06 MED ORDER — FAMOTIDINE 20 MG TABLET
20 mg | Freq: Once | ORAL | Status: CP
Start: 2022-09-06 — End: ?
  Administered 2022-09-06: 14:00:00 20 mg via ORAL

## 2022-09-06 MED ORDER — PACLITAXEL 0-75 MG IN 100 ML INFUSION
Freq: Once | INTRAVENOUS | Status: CP
Start: 2022-09-06 — End: ?
  Administered 2022-09-06: 14:00:00 100.000 mL/h via INTRAVENOUS

## 2022-09-06 MED ORDER — PALONOSETRON 0.25 MG/5 ML INTRAVENOUS SOLUTION
0.255 mg/5 mL | Freq: Once | INTRAVENOUS | Status: CP
Start: 2022-09-06 — End: ?
  Administered 2022-09-06: 14:00:00 0.25 mL via INTRAVENOUS

## 2022-09-06 MED ORDER — DIPHENHYDRAMINE 25 MG CAPSULE
25 mg | Freq: Once | ORAL | Status: CP
Start: 2022-09-06 — End: ?
  Administered 2022-09-06: 14:00:00 25 mg via ORAL

## 2022-09-06 MED ORDER — DIPHENHYDRAMINE 50 MG/ML INJECTION (WRAPPED E-RX)
50 mg/mL | Freq: Once | INTRAVENOUS | Status: DC | PRN
Start: 2022-09-06 — End: 2022-09-11

## 2022-09-06 MED ORDER — SODIUM CHLORIDE 0.9 % BOLUS (NEW BAG)
0.9 % | Freq: Once | INTRAVENOUS | Status: DC | PRN
Start: 2022-09-06 — End: 2022-09-11

## 2022-09-06 MED ORDER — HYDROCORTISONE SODIUM SUCCINATE 100 MG SOLUTION FOR INJECTION
100 mg | Freq: Once | INTRAVENOUS | Status: DC | PRN
Start: 2022-09-06 — End: 2022-09-11

## 2022-09-06 MED ORDER — CARBOPLATIN INFUSION (BY AUC) (DOSE>=125MG)
Freq: Once | INTRAVENOUS | Status: CP
Start: 2022-09-06 — End: ?
  Administered 2022-09-06: 16:00:00 250.000 mL/h via INTRAVENOUS

## 2022-09-06 NOTE — Progress Notes (Signed)
  [facility] OFFICE PROGRESS NOTE   Diagnosis: Anal cancer  INTERVAL HISTORY:   Alyssa Deleon returns as scheduled.  Diarrhea is better.  She estimates taking about 3 Imodium per day.  She intermittently feels bloated.  Abdominal soreness has moved from the right side to the left side.  Objective:  Vital signs in last 24 hours:  Blood pressure 101/72, pulse 100, temperature 98.1 F (36.7 C), temperature source Oral, resp. rate 18, height 5\' 7"  (1.702 m), weight 162 lb 14.4 oz (73.9 kg), SpO2 98 %.    Lymphatics: No palpable cervical, supraclavicular, axillary or inguinal lymph nodes. Resp: Lungs clear bilaterally. Cardio: Regular rate and rhythm. GI: No hepatosplenomegaly.  Tenderness at the left lower abdomen.  No mass. Vascular: No leg edema. Skin: Radiation hyperpigmentation at the groin and perineum.  No skin breakdown.   Lab Results:  Lab Results  Component Value Date   WBC 4.8 08/16/2022   HGB 12.4 08/16/2022   HCT 37.3 08/16/2022   MCV 96.4 08/16/2022   PLT 206 08/16/2022   NEUTROABS 3.3 08/16/2022    Imaging:  No results found.  Medications: I have reviewed the patient's current medications.  Assessment/Plan: Anal cancer Anal mass noted on digital exam and colonoscopy 05/09/2022-biopsy consistent with squamous cell carcinoma CTs 05/12/2022-no evidence of thoracic metastatic disease, calcified right pleural plaques, asymmetric thickening of the right posterior wall of the distal rectum/anal canal, no evidence of metastatic adenopathy in the abdomen or pelvis, 2 small hypodense left liver lesions favored to represent benign cysts PET scan 05/30/2022-intense hypermetabolic activity at the anus.  No evidence of metastatic adenopathy.  Indeterminate small, mildly hypermetabolic lesion in the lateral segment of the left hepatic lobe. Radiation 06/06/2022-07/19/2022 Cycle one 5-FU/Mitomycin-C 06/06/2022 cycle two 5-FU/Mitomycin-C 07/04/2022 (5-FU dose reduced  secondary to diarrhea) MRI liver 06/15/2022-1 cm hypervascular mass in segment 2 of the left hepatic lobe, corresponds to the focus of hypermetabolism on recent PET-CT.  Imaging characteristics atypical for metastatic squamous cell carcinoma.  Follow-up MRI in 3 months Cycle two 5-FU/Mitomycin-C 07/04/2022 (5-FU dose reduced secondary to diarrhea) 08/29/2022 anoscopy, Dr. Maisie Fus, smaller posterior midline rectal mass approximately in the dentate line Depression Left breast cancer 1995 Recurrent left breast cancer 2003?  Status post a left mastectomy and TRAM Family history of multiple cancers she reports being negative for a BRCA mutation  Disposition: Alyssa Deleon continues to recover from radiation and chemotherapy.  The diarrhea is better.  She is tapering off of Imodium.   Recent anoscopy showed the rectal mass was smaller.  She will see Dr. Maisie Fus in approximately 3 months.  We are referring her for a follow-up MRI of the liver to reevaluate the 1 cm left hepatic lobe mass on prior MRI.  She will return for follow-up in 4 months.    Alyssa Deleon ANP/GNP-BC   09/06/2022  10:45 AM

## 2022-09-06 NOTE — Telephone Encounter (Signed)
The appointment for MR has been scheduled and the patient has been informed.

## 2022-09-06 NOTE — Progress Notes
Pt alert and oriented x3. Pt arrived to clinic for C1D1. PIV#[id number] placed via RFA, brisk blood return noted, labs drawn and sent for processing. Pt was seen by Dr. Maxcine Ham, labs reviewed and pt cleared for treatment. Consent obtained by Provider and in chart. Pt premedicated with decadron, aloxi, pepcid and benadryl as ordered. Pt received chemotherapy and anitemetic teaching at chairside by Ebony Hail Pharmacist. Teaching reinforced during treatment. Pt tolerated Taxol infusion over 1 hour without adverse reactions. Followed by Carboplatin infusion over 30 minutes without adverse reactions. PIV flushed and removed, clean dry dsg applied. Pt d/c in stable condition.

## 2022-09-06 NOTE — Consults
Oncology Pharmacist Counseling NoteOn September 06, 2022, Courtney Knox was counseled by their oncology pharmacist Marga Melnick, PharmD.Cancer Therapy: Carboplatin + Paclitaxel - Weekly with radiationCycle 1 Day 1The patient's anti-cancer therapy was reviewed. The following concerns and questions were addressed:Patient aware to contact clinic/come directly to the hospital if experienced a fever > 100.4 F, or for unusual signs of bleeding/bruising, including, but not limited to: uncontrolled nose bleeds, blood in urine, black tarry stool, etc. The patient's take home medications were reviewed. The following concerns and questions were addressed:Prochlorperazine 10 mg by mouth every 6 hours as needed for nausea/vomitingEducation handouts were provided from [facility].The most common side effects of Carboplatin and Paclitaxel discussed include:MyelosuppressionInfusion reactionsPeripheral neuropathyNausea/vomitingApproximate amount of time spent counseling: 30 minutesDarren Dresden Knox, PharmDJune 11, 2024

## 2022-09-06 NOTE — Other
PHARMACIST: ANTICANCER TREATMENT NOTEPatient Diagnosis: NSCLCAttending: Scott GettingerProtocol/Regimen Name: Carbo + paclitaxel + XRTCycle:  1Treatment plan medications: Paclitaxel 45 mg/m2 IV day 1 Carboplatin AUC2 IV day 1Cycle repeats: continuous Supportive Care:Emesis prophylaxis: palonosetron, dexamethasone  Drug Interaction assessment: no pertinent DDIs Current Outpatient Medications on File Prior to Encounter Medication Sig Dispense Refill  b complex vitamins (B COMPLEX) tablet Take 1 tablet by mouth daily.    buPROPion XL (WELLBUTRIN XL) 150 mg 24 hr tablet Take 1 tablet (150 mg total) by mouth every morning. (Patient not taking: Reported on 08/04/2022) 90 tablet 1  calcium carbonate/vitamin D3 (CALCIUM WITH VITAMIN D3 ORAL) Take by mouth daily.    prochlorperazine (COMPAZINE) 10 mg tablet Take 1 tablet (10 mg total) by mouth every 6 (six) hours as needed (for nausea or vomiting). 60 tablet 1 No current facility-administered medications on file prior to encounter. Signed consent:  6/11/24Prior Auth: 8/14/24Patient Description: 67 yo femaleWeight: 42.6 kg                Height: 5'4BSA: 1.39 m2BMI: 16.14 kg/m2Monitoring ParametersCBC and CMP reviewedCBC:Recent Results (from the past 8736 hour(s)) CBC auto differential  Collection Time: 09/06/22  8:09 AM Result Value Ref Range  WBC 6.5 4.0 - 11.0 x1000/?L  RBC 5.25 4.00 - 6.00 M/?L  Hemoglobin 16.0 (H) 11.7 - 15.5 g/dL  Hematocrit 54.09 (H) 81.19 - 45.00 %  MCV 95.6 80.0 - 100.0 fL  MCH 30.5 27.0 - 33.0 pg  MCHC 31.9 31.0 - 36.0 g/dL  RDW-CV 14.7 82.9 - 56.2 %  Platelets 293 150 - 420 x1000/?L  MPV 8.7 8.0 - 12.0 fL  Neutrophils 61.1 39.0 - 72.0 %  Lymphocytes 25.7 17.0 - 50.0 %  Monocytes 7.4 4.0 - 12.0 %  Eosinophils 4.8 0.0 - 5.0 %  Basophil 0.8 0.0 - 1.4 %  Immature Granulocytes 0.2 0.0 - 1.0 %  nRBC 0.0 0.0 - 1.0 %  ANC(Abs Neutrophil Count) 3.97 2.00 - 7.60 x 1000/?L  Absolute Lymphocyte Count 1.67 0.60 - 3.70 x 1000/?L  Monocyte Absolute Count 0.48 0.00 - 1.00 x 1000/?L  Eosinophil Absolute Count 0.31 0.00 - 1.00 x 1000/?L  Basophil Absolute Count 0.05 0.00 - 1.00 x 1000/?L  Absolute Immature Granulocyte Count 0.01 0.00 - 0.30 x 1000/?L  Absolute nRBC 0.00 0.00 - 1.00 x 1000/?L [id number] Results (from the past 8736 hour(s)) Comprehensive metabolic panel  Collection Time: 09/06/22  8:09 AM Result Value Ref Range  Sodium 143 136 - 144 mmol/L  Potassium 4.2 3.3 - 5.3 mmol/L  Chloride 102 98 - 107 mmol/L  CO2 30 20 - 30 mmol/L  Anion Gap 11 7 - 17  Glucose 89 70 - 100 mg/dL  BUN 25 (H) 8 - 23 mg/dL  Creatinine 6.96 2.95 - 1.30 mg/dL  Calcium 28.4 8.8 - 13.2 mg/dL  BUN/Creatinine Ratio 44.0 (H) 8.0 - 23.0  Total Protein 8.3 5.9 - 8.3 g/dL  Albumin 4.4 3.6 - 5.1 g/dL  Total Bilirubin 0.4 <=1.0 mg/dL  Alkaline Phosphatase 69 9 - 122 U/L  Alanine Aminotransferase (ALT) 18 10 - 35 U/L  Aspartate Aminotransferase (AST) 36 (H) 10 - 35 U/L  Globulin 3.9 2.0 - 3.9 g/dL  A/G Ratio 1.1 1.0 - 2.2  AST/ALT Ratio 2.0 Reference Range Not Established  eGFR (Creatinine) >60 >=60 mL/min/1.43m2 LDH: No results found for this or any previous visit (from the past 8736 hour(s)).eClCr = 52 ml/min Anticancer Treatment History: 09/06/2022: Deidre Ala Ledell Noss + paclitaxel + XRT Anticancer treatment note completed  [id number] Signed by Evern Core, PharmD, September 06, 2022

## 2022-09-07 ENCOUNTER — Other Ambulatory Visit: Payer: Self-pay

## 2022-09-07 ENCOUNTER — Ambulatory Visit: Admit: 2022-09-07 | Payer: BLUE CROSS/BLUE SHIELD | Primary: Internal Medicine

## 2022-09-07 ENCOUNTER — Inpatient Hospital Stay: Admit: 2022-09-07 | Discharge: 2022-09-07 | Payer: BLUE CROSS/BLUE SHIELD | Primary: Internal Medicine

## 2022-09-07 DIAGNOSIS — Z5111 Encounter for antineoplastic chemotherapy: Secondary | ICD-10-CM

## 2022-09-07 DIAGNOSIS — Z510 Encounter for antineoplastic radiation therapy: Secondary | ICD-10-CM

## 2022-09-07 NOTE — Telephone Encounter (Signed)
Patient called in regards to prolia. States she is due and has been told by [facility] that they have it in stock but are waiting for our message to send it to PCP office. Please Advise.

## 2022-09-08 ENCOUNTER — Other Ambulatory Visit (HOSPITAL_COMMUNITY): Payer: Self-pay

## 2022-09-08 ENCOUNTER — Telehealth: Payer: Self-pay

## 2022-09-08 ENCOUNTER — Ambulatory Visit: Admit: 2022-09-08 | Payer: BLUE CROSS/BLUE SHIELD | Primary: Internal Medicine

## 2022-09-08 ENCOUNTER — Inpatient Hospital Stay: Admit: 2022-09-08 | Discharge: 2022-09-08 | Payer: BLUE CROSS/BLUE SHIELD | Primary: Internal Medicine

## 2022-09-08 DIAGNOSIS — Z5111 Encounter for antineoplastic chemotherapy: Secondary | ICD-10-CM

## 2022-09-08 DIAGNOSIS — Z510 Encounter for antineoplastic radiation therapy: Secondary | ICD-10-CM

## 2022-09-08 NOTE — Telephone Encounter (Signed)
Prolia VOB initiated via [email or web address]  Last Prolia inj: 03/10/22 Next Prolia inj DUE: 09/07/22

## 2022-09-08 NOTE — Telephone Encounter (Signed)
Pharmacy Patient Advocate Encounter  Insurance verification completed.    The patient is insured through [employer]   Ran test claims for: PROLIA 60MG .  Pharmacy benefit copay: $150.00   **This test claim was processed through [facility]- copay amounts may vary at other pharmacies due to pharmacy/plan contracts, or as the patient moves through the different stages of their insurance plan.**

## 2022-09-08 NOTE — Telephone Encounter (Signed)
Created new encounter for Prolia BIV. Will route encounter back once benefit verification is complete.  

## 2022-09-09 ENCOUNTER — Other Ambulatory Visit (HOSPITAL_COMMUNITY): Payer: Self-pay

## 2022-09-09 ENCOUNTER — Emergency Department (HOSPITAL_BASED_OUTPATIENT_CLINIC_OR_DEPARTMENT_OTHER): Payer: Medicare Other | Admitting: Radiology

## 2022-09-09 ENCOUNTER — Encounter (HOSPITAL_BASED_OUTPATIENT_CLINIC_OR_DEPARTMENT_OTHER): Payer: Self-pay | Admitting: Emergency Medicine

## 2022-09-09 ENCOUNTER — Other Ambulatory Visit: Payer: Self-pay

## 2022-09-09 ENCOUNTER — Emergency Department (HOSPITAL_BASED_OUTPATIENT_CLINIC_OR_DEPARTMENT_OTHER)
Admission: EM | Admit: 2022-09-09 | Discharge: 2022-09-09 | Disposition: A | Discharge status: Home / Self Care | Payer: Medicare Other | Attending: Emergency Medicine | Admitting: Emergency Medicine

## 2022-09-09 ENCOUNTER — Inpatient Hospital Stay: Admit: 2022-09-09 | Discharge: 2022-09-09 | Payer: BLUE CROSS/BLUE SHIELD | Primary: Internal Medicine

## 2022-09-09 ENCOUNTER — Encounter: Admit: 2022-09-09 | Payer: PRIVATE HEALTH INSURANCE | Attending: Radiation Oncology | Primary: Internal Medicine

## 2022-09-09 DIAGNOSIS — Z510 Encounter for antineoplastic radiation therapy: Secondary | ICD-10-CM

## 2022-09-09 DIAGNOSIS — J449 Chronic obstructive pulmonary disease, unspecified: Secondary | ICD-10-CM

## 2022-09-09 DIAGNOSIS — Z5111 Encounter for antineoplastic chemotherapy: Secondary | ICD-10-CM

## 2022-09-09 DIAGNOSIS — Z720 Tobacco use: Secondary | ICD-10-CM

## 2022-09-09 DIAGNOSIS — K529 Noninfective gastroenteritis and colitis, unspecified: Secondary | ICD-10-CM

## 2022-09-09 DIAGNOSIS — R0789 Other chest pain: Secondary | ICD-10-CM | POA: Insufficient documentation

## 2022-09-09 DIAGNOSIS — Z8673 Personal history of transient ischemic attack (TIA), and cerebral infarction without residual deficits: Secondary | ICD-10-CM | POA: Diagnosis not present

## 2022-09-09 LAB — BASIC METABOLIC PANEL
Anion gap: 10 (ref 5–15)
BUN: 13 mg/dL (ref 8–23)
CO2: 25 mmol/L (ref 22–32)
Calcium: 9.3 mg/dL (ref 8.9–10.3)
Chloride: 104 mmol/L (ref 98–111)
Creatinine, Ser: 0.83 mg/dL (ref 0.44–1.00)
GFR, Estimated: >60 mL/min (ref >60)
Glucose, Bld: 92 mg/dL (ref 70–99)
Potassium: 4 mmol/L (ref 3.5–5.1)
Sodium: 139 mmol/L (ref 135–145)

## 2022-09-09 LAB — CBC
HCT: 31.5 % — ABNORMAL LOW (ref 36.0–46.0)
Hemoglobin: 10.3 g/dL — ABNORMAL LOW (ref 12.0–15.0)
MCH: 31.9 pg (ref 26.0–34.0)
MCHC: 32.7 g/dL (ref 30.0–36.0)
MCV: 97.5 fL (ref 80.0–100.0)
Platelets: 208 10*3/uL (ref 150–400)
RBC: 3.23 MIL/uL — ABNORMAL LOW (ref 3.87–5.11)
RDW: 15.4 % (ref 11.5–15.5)
WBC: 5.2 10*3/uL (ref 4.0–10.5)
nRBC: 0 % (ref 0.0–0.2)

## 2022-09-09 LAB — TROPONIN I (HIGH SENSITIVITY)
Troponin I (High Sensitivity): 2 ng/L (ref <18)
Troponin I (High Sensitivity): 2 ng/L (ref <18)

## 2022-09-09 MED ORDER — EPCLUSA 400 MG-100 MG TABLET
400-100 mg | Status: AC
Start: 2022-09-09 — End: ?

## 2022-09-09 NOTE — Discharge Instructions (Addendum)
I am glad you are feeling better.  Your workup was nonconcerning for heart attack at this time.  Chest x-ray and blood work was reassuring. I recommend following up with cardiology given your chest pain - I have sent in a referral and the cardiology office should reach out to you in the next couple of days.  Please seek emergency care if experiencing any new or worsening symptoms.

## 2022-09-09 NOTE — Telephone Encounter (Signed)
Authorization Number: [id number] 09/09/22-09/09/23

## 2022-09-09 NOTE — Telephone Encounter (Signed)
Pt ready for scheduling for PROLIA on or after : 09/09/22  Out-of-pocket cost due at time of visit: $332  Primary: [employer]MEDICARE Prolia co-insurance: 20% Admin fee co-insurance: $30  Secondary: --- Prolia co-insurance:  Admin fee co-insurance:   Medical Benefit Details: Date Benefits were checked: 09/08/22 Deductible: NO/ Coinsurance: 20%/ Admin Fee: $30  Prior Auth: APPROVED PA# [id number]  Expiration Date: 09/09/22-09/09/23   Pharmacy benefit: Copay $150 If patient wants fill through the pharmacy benefit please send prescription to: [facility], and include estimated need by date in rx notes. Pharmacy will ship medication directly to the office.  Patient NOT eligible for Prolia Copay Card. Copay Card can make patient's cost as little as $25. Link to apply: [email or web address]://[email or web address].[email or web address]/copay  ** This summary of benefits is an estimation of the patient's out-of-pocket cost. Exact cost may very based on individual plan coverage.

## 2022-09-09 NOTE — ED Triage Notes (Signed)
Pt presents to ED POV. Pt c/o mid to R CP that began ~1400 today. Pt reports that pain worsen w/ movement. Pt reports she is currently being treated for anal cancer.

## 2022-09-09 NOTE — ED Notes (Signed)
Discharge instructions discussed with pt. Pt verbalized understanding with no additional questions at this time. Pt to go home with friend.

## 2022-09-09 NOTE — ED Provider Notes (Signed)
[place] EMERGENCY DEPARTMENT AT [facility] Provider Note   CSN: [id number] Arrival date & time: 09/09/22  1718     History  Chief Complaint  Patient presents with   Chest Pain    Alyssa Deleon is a 67 y.o. female with PMHx stroke, enlarged heart, panic attacks who presents to ED complaining of intermittent right sided chest pain for 4 hours. Pain worsens with neck movement. She endorses panic attacks where chest pain is similar but usually radiates to throat. Hx anal cancer - finished radiation in April, now on chemo with last treatment around 86mo ago.  Denies fever, dyspnea, cough, abdominal pain, nausea, vomiting. Denies hx DVT/PE, recent trauma/surgeries/immobilization, hemoptysis, calf tenderness/swelling.   Chest Pain      Home Medications Prior to Admission medications   Medication Sig Start Date End Date Taking? Authorizing Provider  ALPRAZolam Prudy Feeler) 1 MG tablet TAKE 2 TABLETS BY MOUTH EVERY  NIGHT AT BEDTIME AS NEEDED FOR  AXIETY 05/02/22   Allwardt, Crist Infante, PA-C  aspirin EC 81 MG tablet Take 1 tablet (81 mg total) by mouth daily. Swallow whole. Patient not taking: Reported on 06/13/2022 05/31/21   Lyn Records, MD  buPROPion (WELLBUTRIN XL) 300 MG 24 hr tablet TAKE 1 TABLET BY MOUTH DAILY 03/18/22   Allwardt, Crist Infante, PA-C  Calcium Carbonate-Vitamin D 600-5 MG-MCG CAPS Take by mouth.    [provider]  denosumab (PROLIA) 60 MG/ML SOSY injection Inject 60 mg into the skin every 6 (six) months.    [provider]  denosumab (PROLIA) 60 MG/ML SOSY injection Inject 60 mg into the skin once for 1 dose. 08/25/22 09/10/22  Allwardt, Crist Infante, PA-C  diphenoxylate-atropine (LOMOTIL) 2.5-0.025 MG tablet Take 1 tablet by mouth 4 (four) times daily as needed for diarrhea or loose stools. Patient not taking: Reported on 08/16/2022 07/11/22   Ronny Bacon, PA-C  DULoxetine (CYMBALTA) 60 MG capsule TAKE 1 CAPSULE BY MOUTH DAILY 03/18/22    Allwardt, Crist Infante, PA-C  ezetimibe (ZETIA) 10 MG tablet Take 1 tablet (10 mg total) by mouth daily. 04/21/22   Allwardt, Crist Infante, PA-C  gabapentin (NEURONTIN) 300 MG capsule TAKE 1 CAPSULE BY MOUTH TWICE  DAILY 08/02/22   Allwardt, Alyssa M, PA-C  loperamide (IMODIUM A-D) 2 MG tablet Take 2 mg by mouth 4 (four) times daily.    [provider]  LORazepam (ATIVAN) 0.5 MG tablet Take 1 tablet by mouth or dissolve q 4-6 hours as needed for nausea if Zofran/Compazine are not working. Do not take Xanax within 4 hours. Patient not taking: Reported on 08/16/2022 07/11/22   Ronny Bacon, PA-C  magic mouthwash (nystatin, diphenhydrAMINE, alum & mag hydroxide) suspension mixture Swish and spit 5 mL by mouth as needed for mouth pain. Patient not taking: Reported on 06/17/2022 06/06/22     ondansetron (ZOFRAN) 8 MG tablet Take 1 tablet (8 mg total) by mouth every 8 (eight) hours as needed for nausea or vomiting. Patient not taking: Reported on 07/04/2022 06/03/22   Ladene Artist, MD  prochlorperazine (COMPAZINE) 10 MG tablet Take 1 tablet (10 mg total) by mouth every 6 (six) hours as needed for nausea or vomiting. Patient not taking: Reported on 07/04/2022 06/03/22   Ladene Artist, MD  simvastatin (ZOCOR) 40 MG tablet TAKE 1 TABLET BY MOUTH DAILY 01/24/22   Allwardt, Crist Infante, PA-C  traZODone (DESYREL) 150 MG tablet Take 1 tablet (150 mg total) by mouth at bedtime. 08/19/22  Allwardt, Alyssa M, PA-C  TYRVAYA 0.03 MG/ACT SOLN Place 1 spray into both nostrils 2 (two) times daily. 02/22/22   [provider]      Allergies    Hydrocodone-acetaminophen, Klonopin [clonazepam], Morphine sulfate, Rosuvastatin, Venlafaxine, Grass pollen(k-o-r-t-swt vern), Seasonal ic [cholestatin], Ezetimibe-simvastatin, and Sulfa antibiotics    Review of Systems   Review of Systems  Cardiovascular:  Positive for chest pain.    Physical Exam Updated Vital Signs BP 105/64   Pulse 81   Temp 98 F (36.7 C)  (Oral)   Resp 18   SpO2 93%  Physical Exam Vitals and nursing note reviewed.  Constitutional:      General: She is not in acute distress.    Appearance: She is not ill-appearing or toxic-appearing.  HENT:     Head: Normocephalic and atraumatic.     Mouth/Throat:     Mouth: Mucous membranes are moist.     Pharynx: No oropharyngeal exudate or posterior oropharyngeal erythema.  Eyes:     General: No scleral icterus.       Right eye: No discharge.        Left eye: No discharge.     Conjunctiva/sclera: Conjunctivae normal.  Neck:     Vascular: No JVD.  Cardiovascular:     Rate and Rhythm: Normal rate and regular rhythm.     Pulses: Normal pulses.          Radial pulses are 2+ on the right side and 2+ on the left side.       Dorsalis pedis pulses are 2+ on the right side and 2+ on the left side.     Heart sounds: Normal heart sounds. No murmur heard. Pulmonary:     Effort: Pulmonary effort is normal. No respiratory distress.     Breath sounds: Normal breath sounds. No wheezing, rhonchi or rales.  Chest:     Comments: Tenderness to palpation of right chest Abdominal:     General: Bowel sounds are normal.     Palpations: Abdomen is soft. There is no mass.     Tenderness: There is no abdominal tenderness.  Musculoskeletal:     Right lower leg: No tenderness. No edema.     Left lower leg: No tenderness. No edema.  Skin:    General: Skin is warm and dry.     Capillary Refill: Capillary refill takes less than 2 seconds.     Findings: No rash.  Neurological:     General: No focal deficit present.     Mental Status: She is alert and oriented to person, place, and time. Mental status is at baseline.     Motor: No weakness.  Psychiatric:        Mood and Affect: Mood normal.     ED Results / Procedures / Treatments   Labs (all labs ordered are listed, but only abnormal results are displayed) Labs Reviewed  CBC - Abnormal; Notable for the following components:      Result Value    RBC 3.23 (*)    Hemoglobin 10.3 (*)    HCT 31.5 (*)    All other components within normal limits  BASIC METABOLIC PANEL  TROPONIN I (HIGH SENSITIVITY)  TROPONIN I (HIGH SENSITIVITY)    EKG None  Radiology DG Chest Port 1 View  Result Date: 09/09/2022 CLINICAL DATA:  Right-sided chest pain today. Being treated for cancer. EXAM: PORTABLE CHEST 1 VIEW COMPARISON:  03/29/2021 FINDINGS: Emphysematous changes in the lungs. Cardiac enlargement. No vascular  congestion or edema. Calcified granuloma in the right mid lung. Linear scarring or atelectasis in the left mid lung. No developing consolidation or airspace disease. No pleural effusions. No pneumothorax. Mediastinal contours appear intact. Calcification of the aorta. Thoracic scoliosis convex towards the right. Surgical clips in the left axilla. IMPRESSION: Cardiac enlargement.  No evidence of active pulmonary disease. Electronically Signed   By: Burman Nieves M.D.   On: 09/09/2022 18:10    Procedures Procedures    Medications Ordered in ED Medications - No data to display  ED Course/ Medical Decision Making/ A&P                             Medical Decision Making Amount and/or Complexity of Data Reviewed Labs: ordered. Radiology: ordered.   This patient presents to the ED for concern of chest pain, this involves an extensive number of treatment options, and is a complaint that carries with it a high risk of complications and morbidity.  The differential diagnosis includes acute coronary syndrome, congestive heart failure, pericarditis, pneumonia, pulmonary embolism, tension pneumothorax, esophageal rupture, aortic dissection, cardiac tamponade, musculoskeletal   Co morbidities that complicate the patient evaluation  Stroke, anxiety, cancer     Lab Tests:  I Ordered, and personally interpreted labs.  The pertinent results include:  - Troponin: initial and repeat within normal limits - BMP: no concern for electrolyte  abnormality; no concern for kidney damage - CBC: No concern for anemia or leukocytosis     Imaging Studies ordered:  I ordered imaging studies including  -chest xray: enlarged heart (which is baseline for patient) no acute cardiopulmonary disease I independently visualized and interpreted imaging  I agree with the radiologist interpretation   Cardiac Monitoring: / EKG:  The patient was maintained on a cardiac monitor.  I personally viewed and interpreted the cardiac monitored which showed an underlying rhythm of: sinus rhythm without acute ST changes or arrhythmias    Problem List / ED Course / Critical interventions / Medication management  Patient presented for chest pain.  Initial and repeat troponin, EKG, and chest x-ray reassuring.  Chest x-ray showing enlarged heart, which patient says is baseline for her-physical exam not concerning for CHF.  Physical exam showing tenderness to palpation of right chest. Upon sharing results with patient, patient states that her symptoms have resolved without medical intervention.  Patient stating that she believes her pain is from her chemotherapy. Given patient's age and chest pain, I recommended her following up with cardiology.  Provided cardiology information in discharge paperwork.  Patient verbally agreed to plan. I have reviewed the patients home medicines and have made adjustments as needed Patient was given return precautions. Patient stable for discharge at this time.  Patient verbalized understanding of plan.  Ddx:  These are considered less likely due to history of present illness and physical exam findings.  Acute coronary syndrome: EKG and troponins within normal limits  Congestive heart failure: patient denies orthopnea, cough, and leg edema Pericarditis: pain is not positional and patient denies orthopnea and recent illness Pneumonia: lungs are clear to auscultation bilaterally Pulmonary embolism: no recent surgeries, blood  clot hx, hemoptysis, cancer hx, vitals stable Pneumothorax: lungs are clear to auscultation bilaterally Esophageal rupture: patient denies vomiting, heavy drinking, and hx of GERD Aortic dissection: vital signs are stable, no variation in pulse pressure Cardiac tamponade: absence of hypotension, JVD, and muffled heart sounds   Social Determinants of Health:  none  Final Clinical Impression(s) / ED Diagnoses Final diagnoses:  Other chest pain    Rx / DC Orders ED Discharge Orders     None         Margarita Rana 09/09/22 2105    Vanetta Mulders, MD 09/12/22 1216

## 2022-09-09 NOTE — Telephone Encounter
Called patient to confirm appts for Monday. She is aware of [place] location. Will discuss lab appts and other questions about her future appts during the visit.

## 2022-09-12 ENCOUNTER — Ambulatory Visit (HOSPITAL_COMMUNITY)
Admission: RE | Admit: 2022-09-12 | Discharge: 2022-09-12 | Disposition: A | Discharge status: Home / Self Care | Payer: Medicare Other | Source: Ambulatory Visit | Attending: Nurse Practitioner | Admitting: Nurse Practitioner

## 2022-09-12 ENCOUNTER — Inpatient Hospital Stay: Admit: 2022-09-12 | Discharge: 2022-09-12 | Payer: BLUE CROSS/BLUE SHIELD | Primary: Internal Medicine

## 2022-09-12 ENCOUNTER — Encounter: Admit: 2022-09-12 | Payer: PRIVATE HEALTH INSURANCE | Attending: Adult Health | Primary: Internal Medicine

## 2022-09-12 ENCOUNTER — Telehealth: Admit: 2022-09-12 | Payer: PRIVATE HEALTH INSURANCE | Attending: Adult Health | Primary: Internal Medicine

## 2022-09-12 ENCOUNTER — Ambulatory Visit: Admit: 2022-09-12 | Payer: BLUE CROSS/BLUE SHIELD | Attending: Adult Health | Primary: Internal Medicine

## 2022-09-12 ENCOUNTER — Inpatient Hospital Stay: Admit: 2022-09-12 | Discharge: 2022-09-13 | Payer: BLUE CROSS/BLUE SHIELD

## 2022-09-12 DIAGNOSIS — Z510 Encounter for antineoplastic radiation therapy: Secondary | ICD-10-CM

## 2022-09-12 DIAGNOSIS — Z5111 Encounter for antineoplastic chemotherapy: Secondary | ICD-10-CM

## 2022-09-12 DIAGNOSIS — C3490 Malignant neoplasm of unspecified part of unspecified bronchus or lung: Secondary | ICD-10-CM

## 2022-09-12 DIAGNOSIS — J449 Chronic obstructive pulmonary disease, unspecified: Secondary | ICD-10-CM

## 2022-09-12 DIAGNOSIS — R109 Unspecified abdominal pain: Secondary | ICD-10-CM

## 2022-09-12 DIAGNOSIS — Z720 Tobacco use: Secondary | ICD-10-CM

## 2022-09-12 DIAGNOSIS — K529 Noninfective gastroenteritis and colitis, unspecified: Secondary | ICD-10-CM

## 2022-09-12 DIAGNOSIS — K769 Liver disease, unspecified: Secondary | ICD-10-CM | POA: Diagnosis present

## 2022-09-12 LAB — COMPREHENSIVE METABOLIC PANEL
BKR A/G RATIO: 1.2 x 1000/ÂµL (ref 1.0–2.2)
BKR ALANINE AMINOTRANSFERASE (ALT): 14 U/L (ref 10–35)
BKR ALBUMIN: 3.9 g/dL (ref 3.6–5.1)
BKR ALKALINE PHOSPHATASE: 62 U/L (ref 9–122)
BKR ANION GAP: 12 g/dL (ref 7–17)
BKR ASPARTATE AMINOTRANSFERASE (AST): 27 U/L (ref 10–35)
BKR AST/ALT RATIO: 1.9 x 1000/ÂµL (ref 0.00–1.00)
BKR BILIRUBIN TOTAL: 0.8 mg/dL (ref 0.0–<=1.2)
BKR BLOOD UREA NITROGEN: 17 mg/dL (ref 8–23)
BKR BUN / CREAT RATIO: 33.3 % — ABNORMAL HIGH (ref 8.0–23.0)
BKR CALCIUM: 9.9 mg/dL — ABNORMAL HIGH (ref 8.8–10.2)
BKR CHLORIDE: 99 mmol/L (ref 98–107)
BKR CO2: 25 mmol/L (ref 20–30)
BKR CREATININE: 0.51 mg/dL (ref 0.40–1.30)
BKR EGFR, CREATININE (CKD-EPI 2021): >60 mL/min/{1.73_m2} (ref >=60–1.00)
BKR GLOBULIN: 3.3 g/dL (ref 2.0–3.9)
BKR GLUCOSE: 84 mg/dL (ref 70–100)
BKR POTASSIUM: 4.5 mmol/L (ref 3.3–5.3)
BKR PROTEIN TOTAL: 7.2 g/dL (ref 5.9–8.3)
BKR SODIUM: 136 mmol/L (ref 136–144)

## 2022-09-12 LAB — CBC WITH AUTO DIFFERENTIAL
BKR WAM ABSOLUTE IMMATURE GRANULOCYTES.: 0.06 x 1000/ÂµL (ref 0.00–0.30)
BKR WAM ABSOLUTE LYMPHOCYTE COUNT.: 1.31 x 1000/ÂµL (ref 0.60–3.70)
BKR WAM ABSOLUTE NRBC (2 DEC): 0 x 1000/ÂµL (ref 0.00–1.00)
BKR WAM ANALYZER ANC: 5.99 x 1000/ÂµL (ref 2.00–7.60)
BKR WAM BASOPHIL ABSOLUTE COUNT.: 0.04 x 1000/ÂµL (ref 0.00–1.00)
BKR WAM BASOPHILS: 0.5 % (ref 0.0–1.4)
BKR WAM EOSINOPHIL ABSOLUTE COUNT.: 0.26 x 1000/ÂµL (ref 0.00–1.00)
BKR WAM EOSINOPHILS: 3.2 % (ref 0.0–5.0)
BKR WAM HEMATOCRIT (2 DEC): 42.2 % (ref 35.00–45.00)
BKR WAM HEMOGLOBIN: 13.9 g/dL (ref 11.7–15.5)
BKR WAM IMMATURE GRANULOCYTES: 0.7 % (ref 0.0–1.0)
BKR WAM LYMPHOCYTES: 16.2 % — ABNORMAL LOW (ref 17.0–50.0)
BKR WAM MCH (PG): 30.3 pg (ref 27.0–33.0)
BKR WAM MCHC: 32.9 g/dL (ref 31.0–36.0)
BKR WAM MCV: 91.9 fL (ref 80.0–100.0)
BKR WAM MONOCYTE ABSOLUTE COUNT.: 0.45 x 1000/ÂµL (ref 0.00–1.00)
BKR WAM MONOCYTES: 5.5 % (ref 4.0–12.0)
BKR WAM MPV: 9 fL (ref 8.0–12.0)
BKR WAM NEUTROPHILS: 73.9 % — ABNORMAL HIGH (ref 39.0–72.0)
BKR WAM NUCLEATED RED BLOOD CELLS: 0 % (ref 0.0–1.0)
BKR WAM PLATELETS: 273 x1000/ÂµL (ref 150–420)
BKR WAM RDW-CV: 12 % (ref 11.0–15.0)
BKR WAM RED BLOOD CELL COUNT.: 4.59 M/ÂµL (ref 4.00–6.00)
BKR WAM WHITE BLOOD CELL COUNT: 8.1 x1000/ÂµL (ref 4.0–11.0)

## 2022-09-12 LAB — LIPASE: BKR LIPASE: 26 U/L (ref 11–55)

## 2022-09-12 LAB — AMYLASE: BKR AMYLASE: 49 U/L (ref 9–136)

## 2022-09-12 MED ORDER — FLUDEOXYGLUCOSE P/DOSE (FDG) INJECTION
Freq: Once | INTRAVENOUS | Status: CP | PRN
Start: 2022-09-12 — End: ?

## 2022-09-12 MED ORDER — GADOBUTROL 1 MMOL/ML IV SOLN
7.0000 mL | Freq: Once | INTRAVENOUS | Status: AC | PRN
  Administered 2022-09-12: 7 mL via INTRAVENOUS

## 2022-09-12 NOTE — Progress Notes
Radiation Oncology Problem VisitI was called to clinic today to see Courtney Knox.  She is a 67 y.o. female active smoker (~50 pack-years) and patient of Dr. 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  She is currently undergoing ChemoRT for this, 6600 cGy in 33 fx.  She is s/p 5/33 treatments.Legrand Rams, APRN, of medical oncology called me to alert me to new abdominal pain Courtney Knox has developed.  She had apparently counseled Courtney Knox extensively and asked she go to the ED but Courtney Knox refused and instead came to her XRT appointment.  I spoke with Courtney Knox after her XRT treatment today.Courtney Knox reports new 10/10 abdominal pain since Friday, associated with mucous like stools.  She denied nausea and vomiting.  She additionally denied diarrhea stating her stools look different than that.  She took some Pepto-bismol for this with minimal effect.  She has been able to eat including an omelette last night but she states she feels like food passes right through her so she has been avoiding eating a bit.  She has not had any fevers, chest pain, or unusual or new difficulty breathing.  PHYSICAL EXAM:BP 110/66  - Pulse 89  - Temp 97.9 ?F (36.6 ?C) (Temporal)  - Resp 18  - SpO2 96% ECOG Performance Status: 1 Pain Score:  10 - Worst pain everGeneral: This appearing woman in no acute distressHEENT: Normocephalic, atraumatic.  No conjunctival injection.  Sclera anicteric.  Vision is grossly intact.Respiratory: Patent airway.  No signs of respiratory distress on room air.Cardiac: No cyanosis.Abdominal: Soft, non-distended.  Tender to light palpation in the LLQ; some lesser tenderness in the RLQ.  Otherwise non-tender.  No rebound.  No palpable HSMRectal: DeferredNeuro: Alert.  Oriented to person, place, time.  Affect appropriate.  Face symmetric.  Speech fluent.  Hearing is grossly intact.  Ambulatory.  Gait not assessed.In light of these findings, I recommended an evaluation in the ED.  I stated to Courtney Knox that I am not sure what is going on or causing her symptoms, but that presenting to the ED for additional workup including an abdominal ultrasound, infectious workup, and other imaging prn may help [place] get to the bottom of this.  Courtney Knox stated that she absolutely cannot go directly to the ED but I did reiterate that this is my formal recommendation at this time. She did eventually express that she will reconsider presenting later tonight after she can take care of some things at home including her horse.  She understands that not presenting to the ED now is AGAINST MEDICAL ADVICE.We reviewed ED precautions and reasons to drop everything and go to the emergency room this evening including developing any new symptoms but especially new fever, vomiting, nausea, chest pain, or difficulty breathing.  Courtney Knox was agreeable to this and voiced an understanding.  She additionally stated that she would be even more open to an ED evaluation if she continues to have symptoms tomorrow.I encouraged p.o. hydration as well.All questions answered to apparent satisfaction.  Will alert Dr. Valora Corporal.Herold Harms, MDResident Physician, PGY-2Department of Therapeutic RadiologyMHB: [phone]

## 2022-09-12 NOTE — Progress Notes
ON TREATMENT VISIT NOTE Diagnosis: NSCLCSite: RUL LungCurrent RT dose: 800 cGyPlanned RT Dose: 6600 cGyFx: 4Nursing Assessment: PT tolerating treatment well.Doing well with no new pulmonary complaints. PE:BP 116/77  - Temp 98.2 ?F (36.8 ?C) (Oral)  - Resp 18  - Wt 44 kg  - SpO2 95%  - BMI 16.65 kg/m? Pain Score:   0 - No painWt Readings from Last 3 Encounters: 09/09/22 44 kg 09/06/22 42.6 kg 08/12/22 45.2 kg   In NAD. Karnofsky Score: Normal activity with effort,Karnofsky Score: 80Data Review:Lab Results Component Value Date  WBC 6.5 09/06/2022  RBC 5.25 09/06/2022  HGB 16.0 (H) 09/06/2022  HCT 50.20 (H) 09/06/2022  MCV 95.6 09/06/2022  MCH 30.5 09/06/2022  MCHC 31.9 09/06/2022  PLT 293 09/06/2022  MPV 8.7 09/06/2022 Lab Results Component Value Date  GLU 89 09/06/2022  BUN 25 (H) 09/06/2022  CREATININE 0.55 09/06/2022  CO2 30 09/06/2022  CL 102 09/06/2022  NA 143 09/06/2022  K 4.2 09/06/2022  CALCIUM 10.0 09/06/2022 Lab Results Component Value Date  GLU 89 09/06/2022  BUN 25 (H) 09/06/2022  CREATININE 0.55 09/06/2022  NA 143 09/06/2022  K 4.2 09/06/2022  CL 102 09/06/2022  CO2 30 09/06/2022  ALBUMIN 4.4 09/06/2022  PROT 8.3 09/06/2022  BILITOT 0.4 09/06/2022  ALKPHOS 69 09/06/2022  ALT 18 09/06/2022  GLOB 3.9 09/06/2022  CALCIUM 10.0 09/06/2022  ASSESSMENT AND PLAN:Continue RT. We again discussed that we are treating RUL mass and SCV lymph node given discussions at tumor board and with Dr. Maxcine Ham. We explained that RT will hopefully help with chest wall pain (pain plan). Continue radiation therapy..A review of treatment set-up, dosimetry, and portal images has been performed.

## 2022-09-12 NOTE — Telephone Encounter
call from answering service patient calling staing she has strong pain in her stomach. Please [phone] or [phone]

## 2022-09-12 NOTE — Progress Notes
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 Encinas  Is 67 y.o. female active smoker (~50 pack-years) with a history of COPD and a now biopsy-proven NSCLC (keratinizing SCC) of the RUL with invasion of the posterior chest wall and ribs 5-6 (PD-L1 TPS 5%, at least cT3N0). She additionally has lesion in the LEFT lung currently under surveillance. 06/25/2022 [place] A/P showed findings concerning for colitis predominant involvement of the right colon and cecum. 07/01/2022 [place] chest showed RIGHT upper lobe mass with chest wall invasion. Mass measures 5.1 x 3.3 x 4.9 cm. This mass erodes into the posterior chest wall with adjacent erosion of the RIGHT posterior fifth and sixth ribs. 07/06/2022 FEV1 37% predicted, FEV1/FVC 35%, DLCO 20%. Severe obstruction, severely impaired diffusing capacity. 07/09/2022 MRI brain with no evidence of metastatic disease to the brain. 07/16/2022 [facility] PET/[place] showed intensely hypermetabolic RIGHT upper lobe pleural based lung mass with chest wall and right 5-6th ribs invasion. Biapical hypermetabolic scarring with more prominent on the LEFT apical nodularity, nonspecific but concerning for malignant involvement.She met with Dr Maxcine Ham (med-onc) and Dr Valora Corporal (Rad onc) - decision reached ot proceed with concurrent chemo/RTCURRENT THERAPY: C1D1 carbo/taxol weekly w RT on 6/11/24INTERIM HISTORY: [name] started concurrent therapy last week. Starting 2 days ago she developed acute abdominal pain (10/10 per vitals today), noted a change in stool consistency. Now paste/mucous that just comes out wearing a pad. Urine output has been minimal, but per her report is is normal colored. She has had very little PO food intake, is trying to keep up fluid intake with juice/water combination.She is slightly nauseous but thinks it is from not eating anything all day today. Pain to her right upper back is also severe, she feels like my nerves are on fire. This is worse since starting on radiation treatment last week. She denies significant cough or shortness of breath. She denies headaches, dizziness, fevers, chills, rash and neuropathy. REVIEW OF PAST MEDICAL,SURGICAL,SOCIAL,FAMILY HISTORY: reviewed and unchangedShe  has a past medical history of Colitis, COPD (chronic obstructive pulmonary disease) (HC Code), and Tobacco abuse.She has had a Left wrist surgery. 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 Continues to smoke with roughly 40 pack year history. FAMILY HISTORY:  Father, lung cancer; brother, lymphomaReview of SystemsPertinent items are noted in HPI.A 12- point review of systems, was otherwise negative except as stated in the HPI. ECOG 1ALLERGIES: PenicillinMEDICATIONS: Patient has a current medication list which includes the following prescription(s): b complex vitamins (B COMPLEX) tablet - Take 1 tablet by mouth daily, buPROPion XL (WELLBUTRIN XL) 150 mg 24 hr tablet - Take 1 tablet (150 mg total) by mouth every morning (Patient not taking: Reported on 08/04/2022), calcium carbonate/vitamin D3 (CALCIUM WITH VITAMIN D3 ORAL) - Take by mouth daily, EPCLUSA 400-100 mg per tablet, and prochlorperazine (COMPAZINE) 10 mg tablet - Take 1 tablet (10 mg total) by mouth every 6 (six) hours as needed (for nausea or vomiting) (Patient not taking: Reported on 09/09/2022).PHYSICAL EXAM:BP 108/71  - Pulse 83  - Temp 97.8 ?F (36.6 ?C) (Temporal)  - Wt 42 kg Comment: shoes off - SpO2 99%  - BMI 15.89 kg/m? Physical ExamConstitutional:     Appearance: Normal appearance. HENT:    Head: Normocephalic and atraumatic. Eyes:    Conjunctiva/sclera: Conjunctivae normal. Cardiovascular: Rate and Rhythm: Normal rate and regular rhythm. Pulmonary:  Effort: Pulmonary effort is normal.    Breath sounds: No wheezing, rhonchi or rales. Abdominal:    General: Bowel sounds are normal. There is no distension.    Palpations: Abdomen is soft.    Tenderness: There is abdominal tenderness (TTP to abdomen, particularly lower abdomen). Musculoskeletal:       General: Normal range of motion.    Cervical back: Normal range of motion.    Right lower leg: No edema.    Left lower leg: No edema.    Comments: TTP to right upper back Lymphadenopathy:    Cervical: No cervical adenopathy. Skin:   General: Skin is warm and dry.    Findings: No erythema or rash. Neurological:    General: No focal deficit present.    Mental Status: She is alert and oriented to person, place, and time. Data Review:Lab Results Component Value Date  WBC 8.1 09/12/2022  RBC 4.59 09/12/2022  HGB 13.9 09/12/2022  HCT 42.20 09/12/2022  MCV 91.9 09/12/2022  MCH 30.3 09/12/2022  MCHC 32.9 09/12/2022  PLT 273 09/12/2022  MPV 9.0 09/12/2022 Lab Results Component Value Date  GLU 84 09/12/2022  BUN 17 09/12/2022  CREATININE 0.51 09/12/2022  NA 136 09/12/2022  K 4.5 09/12/2022  CL 99 09/12/2022  CO2 25 09/12/2022  ALBUMIN 3.9 09/12/2022  PROT 7.2 09/12/2022  BILITOT 0.8 09/12/2022  ALKPHOS 62 09/12/2022  ALT 14 09/12/2022  GLOB 3.3 09/12/2022  CALCIUM 9.9 09/12/2022  IMPRESSION:  Malignant neoplasm of bronchus and lung (HC Code)  (primary encounter diagnosis)Ms Weiskopf is a 67 yer old woman diagnosed in the spring of 2024 with 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 She reports severe abdominal pain, changes to her bowel habits and pain to her right upper back in the area of known tumor. We discussed that her symptoms may be due to the chemotherapy she got last week, the med she started about 10 days ago for Hep C or some other cause. She does have a history of colitis this past April but states these symptoms are very different. We discussed that her symptoms do warrant additional work up, I recommended imaging (either abd [place] or [place] abd/pel, stool studies and UA/culture). Added amylase/lipase to her labs. Reviewed her symptoms with Dr Maxcine Ham who agreed with further testing. Patient was aggressive, yelling at times. Frustrated that she is not feeling well and overwhelmed by her health condition. Offered much emotional support. Labs, vitals, ROS reviewed, physical exam completed. Patient meets criteria to proceed with treatment today. PLAN: NSCLC - Labs acceptable for treatment tomorrow but may hold treatment if abdominal pain worsens.  Can continue daily RT at this time.	- Will touch base with her tomorrow in [place] [facility] if she does not go to the ED tonight. Pain - Strongly recommended that patient go to the ED for urgent evaluation given severe abdominal pain, changes to stool consistency, and poor PO intake. Despite much encouragement, she refused to present to the ED but also declined to let me arrange outpatient work up. She left to go to her radiation visit. I called and alerted radiation team to her symptoms. 	- added amylase/lipase to her labs, results pendingPatient to call with new or worsening symptoms prior to the next visit. RTC weekly for chemotherapy. On the day of this patient's encounter, a total of 52 minutes was personally  spent by me.  This does not include any resident/fellow teaching time, or any time spent performing a procedural service.

## 2022-09-12 NOTE — Telephone Encounter
Patient c/o abdominal pain and being unable to digest her food. Per patient, she is very uncomfortable from the pain, is feeling unwell. Patient was in the [facility] parking lot and is coming up to get evaluated as planned.

## 2022-09-13 ENCOUNTER — Ambulatory Visit: Payer: Medicare Other | Admitting: Physician Assistant

## 2022-09-13 ENCOUNTER — Encounter: Payer: Self-pay | Admitting: Physician Assistant

## 2022-09-13 ENCOUNTER — Encounter: Admit: 2022-09-13 | Payer: PRIVATE HEALTH INSURANCE | Primary: Internal Medicine

## 2022-09-13 ENCOUNTER — Ambulatory Visit: Admit: 2022-09-13 | Payer: BLUE CROSS/BLUE SHIELD | Primary: Internal Medicine

## 2022-09-13 ENCOUNTER — Ambulatory Visit: Admit: 2022-09-13 | Payer: BLUE CROSS/BLUE SHIELD | Attending: Adult Health | Primary: Internal Medicine

## 2022-09-13 ENCOUNTER — Encounter: Admit: 2022-09-13 | Payer: PRIVATE HEALTH INSURANCE | Attending: Radiation Oncology | Primary: Internal Medicine

## 2022-09-13 ENCOUNTER — Inpatient Hospital Stay: Admit: 2022-09-13 | Discharge: 2022-09-13 | Payer: BLUE CROSS/BLUE SHIELD | Primary: Internal Medicine

## 2022-09-13 ENCOUNTER — Telehealth: Admit: 2022-09-13 | Payer: PRIVATE HEALTH INSURANCE | Primary: Internal Medicine

## 2022-09-13 VITALS — BP 94/60 | HR 85 | Temp 97.3°F | Ht 67.0 in | Wt 160.8 lb

## 2022-09-13 DIAGNOSIS — Z880 Allergy status to penicillin: Secondary | ICD-10-CM

## 2022-09-13 DIAGNOSIS — Z79633 Long term (current) use of mitotic inhibitor: Secondary | ICD-10-CM

## 2022-09-13 DIAGNOSIS — Z79899 Other long term (current) drug therapy: Secondary | ICD-10-CM

## 2022-09-13 DIAGNOSIS — J449 Chronic obstructive pulmonary disease, unspecified: Secondary | ICD-10-CM

## 2022-09-13 DIAGNOSIS — Z720 Tobacco use: Secondary | ICD-10-CM

## 2022-09-13 DIAGNOSIS — K529 Noninfective gastroenteritis and colitis, unspecified: Secondary | ICD-10-CM

## 2022-09-13 DIAGNOSIS — G893 Neoplasm related pain (acute) (chronic): Secondary | ICD-10-CM

## 2022-09-13 DIAGNOSIS — Z5111 Encounter for antineoplastic chemotherapy: Secondary | ICD-10-CM

## 2022-09-13 DIAGNOSIS — C3490 Malignant neoplasm of unspecified part of unspecified bronchus or lung: Secondary | ICD-10-CM

## 2022-09-13 DIAGNOSIS — Z510 Encounter for antineoplastic radiation therapy: Secondary | ICD-10-CM

## 2022-09-13 DIAGNOSIS — C3411 Malignant neoplasm of upper lobe, right bronchus or lung: Secondary | ICD-10-CM

## 2022-09-13 DIAGNOSIS — Z79630 Long term (current) use of alkylating agent: Secondary | ICD-10-CM

## 2022-09-13 DIAGNOSIS — Z801 Family history of malignant neoplasm of trachea, bronchus and lung: Secondary | ICD-10-CM

## 2022-09-13 DIAGNOSIS — R109 Unspecified abdominal pain: Secondary | ICD-10-CM

## 2022-09-13 DIAGNOSIS — F17210 Nicotine dependence, cigarettes, uncomplicated: Secondary | ICD-10-CM

## 2022-09-13 DIAGNOSIS — M810 Age-related osteoporosis without current pathological fracture: Secondary | ICD-10-CM | POA: Diagnosis not present

## 2022-09-13 DIAGNOSIS — C210 Malignant neoplasm of anus, unspecified: Secondary | ICD-10-CM

## 2022-09-13 DIAGNOSIS — G629 Polyneuropathy, unspecified: Secondary | ICD-10-CM

## 2022-09-13 DIAGNOSIS — R531 Weakness: Secondary | ICD-10-CM | POA: Diagnosis not present

## 2022-09-13 DIAGNOSIS — R296 Repeated falls: Secondary | ICD-10-CM | POA: Diagnosis not present

## 2022-09-13 DIAGNOSIS — J438 Other emphysema: Secondary | ICD-10-CM

## 2022-09-13 LAB — URINALYSIS-MACROSCOPIC W/REFLEX MICROSCOPIC
BKR BILIRUBIN, UA: NEGATIVE
BKR BLOOD, UA: NEGATIVE
BKR GLUCOSE, UA: NEGATIVE
BKR KETONES, UA: NEGATIVE
BKR LEUKOCYTE ESTERASE, UA: NEGATIVE
BKR NITRITE, UA: NEGATIVE
BKR PH, UA: 5.5 (ref 5.5–7.5)
BKR PROTEIN, UA: NEGATIVE
BKR SPECIFIC GRAVITY, UA: 1.007 (ref 1.005–1.030)
BKR UROBILINOGEN, UA (MG/DL): <2 mg/dL (ref <=2.0)

## 2022-09-13 MED ORDER — DIPHENHYDRAMINE 25 MG CAPSULE
25 mg | Freq: Once | ORAL | Status: CP
Start: 2022-09-13 — End: ?
  Administered 2022-09-13: 14:00:00 25 mg via ORAL

## 2022-09-13 MED ORDER — FAMOTIDINE 4 MG/ML IN 0.9% SODIUM CHLORIDE (ADULT)
Freq: Once | INTRAVENOUS | Status: DC | PRN
Start: 2022-09-13 — End: 2022-09-13

## 2022-09-13 MED ORDER — EPINEPHRINE 0.3 MG/0.3 ML INJECTION, AUTO-INJECTOR
0.3 mg/ mL | INTRAMUSCULAR | Status: DC | PRN
Start: 2022-09-13 — End: 2022-09-13

## 2022-09-13 MED ORDER — FAMOTIDINE 20 MG TABLET
20 mg | Freq: Once | ORAL | Status: CP
Start: 2022-09-13 — End: ?
  Administered 2022-09-13: 14:00:00 20 mg via ORAL

## 2022-09-13 MED ORDER — DEXAMETHASONE SODIUM PHOSPHATE (PF) 10 MG/ML INJECTION SOLUTION
10 mg/mL | Freq: Once | INTRAVENOUS | Status: CP
Start: 2022-09-13 — End: ?
  Administered 2022-09-13: 14:00:00 10 mL via INTRAVENOUS

## 2022-09-13 MED ORDER — HYDROCORTISONE SODIUM SUCCINATE 100 MG SOLUTION FOR INJECTION
100 mg | Freq: Once | INTRAVENOUS | Status: DC | PRN
Start: 2022-09-13 — End: 2022-09-13

## 2022-09-13 MED ORDER — SODIUM CHLORIDE 0.9 % INTRAVENOUS SOLUTION
INTRAVENOUS | Status: AC
Start: 2022-09-13 — End: ?
  Administered 2022-09-13: 14:00:00 via INTRAVENOUS

## 2022-09-13 MED ORDER — PALONOSETRON 0.25 MG/5 ML INTRAVENOUS SOLUTION
0.25 mg/5 mL | Freq: Once | INTRAVENOUS | Status: CP
Start: 2022-09-13 — End: ?
  Administered 2022-09-13: 14:00:00 0.25 mL via INTRAVENOUS

## 2022-09-13 MED ORDER — CARBOPLATIN INFUSION (BY AUC) (DOSE>=125MG)
Freq: Once | INTRAVENOUS | Status: CP
Start: 2022-09-13 — End: ?
  Administered 2022-09-13: 16:00:00 250.000 mL/h via INTRAVENOUS

## 2022-09-13 MED ORDER — PACLITAXEL 0-75 MG IN 100 ML INFUSION
Freq: Once | INTRAVENOUS | Status: CP
Start: 2022-09-13 — End: ?
  Administered 2022-09-13: 14:00:00 100.000 mL/h via INTRAVENOUS

## 2022-09-13 MED ORDER — SODIUM CHLORIDE 0.9 % BOLUS (NEW BAG)
0.9 % | Freq: Once | INTRAVENOUS | Status: DC | PRN
Start: 2022-09-13 — End: 2022-09-13

## 2022-09-13 MED ORDER — DIPHENHYDRAMINE 50 MG/ML INJECTION (WRAPPED E-RX)
50 mg/mL | Freq: Once | INTRAVENOUS | Status: DC | PRN
Start: 2022-09-13 — End: 2022-09-13

## 2022-09-13 MED ORDER — ALBUTEROL SULFATE 2.5 MG/3 ML (0.083 %) SOLUTION FOR NEBULIZATION
2.5 mg /3 mL (0.083 %) | RESPIRATORY_TRACT | Status: DC | PRN
Start: 2022-09-13 — End: 2022-09-13

## 2022-09-13 MED ORDER — DENOSUMAB 60 MG/ML ~~LOC~~ SOSY
60.0000 mg | PREFILLED_SYRINGE | Freq: Once | SUBCUTANEOUS | Status: AC
Start: 2022-09-13 — End: 2022-09-13
  Administered 2022-09-13: 60 mg via SUBCUTANEOUS

## 2022-09-13 NOTE — Assessment & Plan Note (Signed)
Following with oncology 

## 2022-09-13 NOTE — Patient Instructions (Addendum)
Referral to our PT ladies at [facility]  Referral to [facility]   Keep working on strength and confidence! You've got this.

## 2022-09-13 NOTE — Assessment & Plan Note (Signed)
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  She has been on gabapentin for nearly 30 years she says.  Continuing gabapentin 300 mg BID. Cymbalta 60 mg daily.  PT will be most helpful for her at this time. Referral sent.

## 2022-09-13 NOTE — Assessment & Plan Note (Signed)
PT will be most helpful for her at this time

## 2022-09-13 NOTE — Assessment & Plan Note (Signed)
Feels like she is having more difficulty breathing lately, would like to see pulmonology. Referral sent.

## 2022-09-13 NOTE — Progress Notes (Signed)
Subjective:    Patient ID: Alyssa Deleon, female    DOB: [birth year], 67 y.o.   MRN: [id number]  Chief Complaint  Patient presents with   Peripheral Neuropathy    Pt in office for follow up for Neuropathy; pt Prolia injection delivered yesterday and administering during visit; Pt states MRI yesterday and spots on liver and may be cyst; pt sates the neuropathy has got bad and cause issues with balance and gait with falling; knees buckling.     HPI Patient is in today for follow-up and discussion about weakness / neuropathy / recurrent falls. See A/P for details.   Past Medical History:  Diagnosis Date   Breast cancer (HCC)    Cataract, left 09/29/2017   Chronic pain syndrome, on Neurontin 09/30/2017   Dry eye syndrome of both eyes, on Restasis 09/30/2017   Emphysema lung (HCC)    Family history of breast cancer    Family history of melanoma    Family history of ovarian cancer    Family history of prostate cancer    Family history of throat cancer    Frequent urinary tract infections    GAD (generalized anxiety disorder) 03/11/2015   Genital herpes    History of chicken pox    History of degenerative disc disease 10/27/2002   Kidney stone    Knee osteonecrosis, 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   Major depression, recurrent, chronic (HCC), followed by Psych, on Wellbutrin, Cymbalta 09/30/2017   Mixed hyperlipidemia, on Zetia and Zocor 09/30/2017   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   MVP (mitral valve prolapse) 03/13/2015   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   Nuclear sclerosis 06/10/2013   Panic disorder, followed by Psych, on Xanax 09/30/2017   Personal history of breast cancer    Psoriasis    PTSD (post-traumatic stress disorder) 03/11/2015   Stroke (HCC)    Total knee replacement status, 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   Vitamin D deficiency 09/30/2017    Past Surgical History:  Procedure Laterality Date   AUGMENTATION MAMMAPLASTY Bilateral    BREAST BIOPSY      KIDNEY STONE SURGERY  01/2022   MASTECTOMY Left 1995   has a tram flap   RECONSTRUCTION BREAST W/ TRAM FLAP Left 2003    Family History  Problem Relation Age of Onset   Cancer - Other Mother    Miscarriages / [place] Mother    Alzheimer's disease Mother    Breast cancer Mother    Skin cancer Mother    Cancer - Prostate Father        metastatic   Hyperlipidemia Father    Throat cancer Father    Cancer - Other Sister    Depression Sister    Hearing loss Sister    Hyperlipidemia Sister    Breast cancer Sister        both breasts   Skin cancer Sister    Cancer - Other Sister    Breast cancer Sister 47   Cancer - Other Brother    Throat cancer Brother    Breast cancer Paternal Aunt    Prostate cancer Paternal Uncle    Breast cancer Maternal Grandmother    Alzheimer's disease Maternal Grandfather    Ovarian cancer Paternal Grandmother    Heart attack Paternal Grandfather    Breast cancer Other        both breasts dx 42   Liver disease Neg Hx    Colon cancer Neg Hx    Esophageal cancer Neg Hx  Rectal cancer Neg Hx    Stomach cancer Neg Hx     Social History   Tobacco Use   Smoking status: Former    Packs/day: 0.75    Years: 42.00    Additional pack years: 0.00    Total pack years: 31.50    Types: Cigarettes    Quit date: 11/06/2020    Years since quitting: 1.8    Passive exposure: Past   Smokeless tobacco: Never  Vaping Use   Vaping Use: Never used  Substance Use Topics   Alcohol use: Yes    Comment: Special Occasions   Drug use: Never     Allergies  Allergen Reactions   Hydrocodone-Acetaminophen Shortness Of Breath    SOB   Klonopin [Clonazepam] Swelling   Morphine Sulfate Itching   Rosuvastatin Other (See Comments)    Myalgias  Muscle cramps    Myalgias   Venlafaxine Other (See Comments)    Weight gain   Grass Pollen(K-O-R-T-Swt Vern) Other (See Comments)   Seasonal Ic [Cholestatin]     Pollen, sinus coughing etc.    Ezetimibe-Simvastatin Other (See Comments)    Myalgias  Causes cramps   Sulfa Antibiotics Diarrhea and Other (See Comments)    unknown    Stomach ache    Review of Systems NEGATIVE UNLESS OTHERWISE INDICATED IN HPI      Objective:     BP 94/60 (BP Location: Left Arm)   Pulse 85   Temp (!) 97.3 F (36.3 C) (Temporal)   Ht 5\' 7"  (1.702 m)   Wt 160 lb 12.8 oz (72.9 kg)   SpO2 98%   BMI 25.18 kg/m   Wt Readings from Last 3 Encounters:  09/13/22 160 lb 12.8 oz (72.9 kg)  09/06/22 162 lb 14.4 oz (73.9 kg)  08/16/22 163 lb 12.8 oz (74.3 kg)    BP Readings from Last 3 Encounters:  09/13/22 94/60  09/09/22 100/63  09/06/22 101/72     Physical Exam Vitals and nursing note reviewed.  Constitutional:      Appearance: Normal appearance.  Eyes:     Extraocular Movements: Extraocular movements intact.     Conjunctiva/sclera: Conjunctivae normal.     Pupils: Pupils are equal, round, and reactive to light.  Cardiovascular:     Rate and Rhythm: Normal rate and regular rhythm.  Pulmonary:     Effort: Pulmonary effort is normal.     Breath sounds: Normal breath sounds.  Neurological:     Mental Status: She is alert.     Gait: Gait abnormal (unsteady).  Psychiatric:        Mood and Affect: Mood normal.        Behavior: Behavior normal.        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  She has been on gabapentin for nearly 30 years she says.  Continuing gabapentin 300 mg BID. Cymbalta 60 mg daily.  PT will be most helpful for her at this time. Referral sent.   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   Orders: -     Ambulatory referral to Physical Therapy -     Ambulatory referral to Pulmonology  Other emphysema [facility]) Assessment & Plan: Feels like she is having more difficulty breathing lately, would like to see pulmonology. Referral sent.   Orders: -     Ambulatory referral to Pulmonology        Return in about 3 months (around 12/14/2022) for recheck/follow-up.    Fidelia Cathers M Ceairra Mccarver, PA-C

## 2022-09-13 NOTE — Unmapped
Courtney Knox arrived to clinic for day 8, cycle 1 Paclitaxel + Carboplatin. Due labs collected yesterday and reviewed by Aprn Duffield. Pt reports that abdominal pain is improved today and was also able to have a small bowel movement. Denies fever, chills, nausea/vomiting, sob, rash, bleeding. Aprn Duffield also assessed Pt at chairside today and gave ok for treatment. Order of 500 mL NS hydration also ordered. Periph iv placed on right forearm, flushed w/o difficulty and +blood return. Pt pre medicated with po Benadryl, po Pepcid, iv Aloxi, and iv Decadron, 30 minute wait time observed. 500 mL 0.9% NS administered over 1 hour without incident. Paclitaxel administered over 1 hour without incident. Followed by Carboplatin over 30 minutes without incident. Confirmed +blood return from periph iv before and after treatment. At completion, periph iv flushed with NS and removed. Guaze and cohesive wrap applied to site. Pt without question and understands to call clinic if any concerns arise. Will return 09/20/22 for follow up and next treatment. Stable and ambulatory at time of discharge.CMPLab Results Component Value Date  GLU 84 09/12/2022  CALCIUM 9.9 09/12/2022  ALBUMIN 3.9 09/12/2022  PROT 7.2 09/12/2022  NA 136 09/12/2022  CO2 25 09/12/2022  CL 99 09/12/2022  BUN 17 09/12/2022  CREATININE 0.51 09/12/2022  ALKPHOS 62 09/12/2022  ALT 14 09/12/2022  AST 27 09/12/2022  BILITOT 0.8 09/12/2022  BILIDIR <0.2 06/25/2022 CBC/Platelets/differentialLab Results Component Value Date  WBC 8.1 09/12/2022  RBC 4.59 09/12/2022  HGB 13.9 09/12/2022  HCT 42.20 09/12/2022  MCV 91.9 09/12/2022  MCHC 32.9 09/12/2022  RDWCV 12.0 09/12/2022  PLT 273 09/12/2022  MPV 9.0 09/12/2022  ANCANC 5.99 09/12/2022  NEUTROPHILS 73.9 (H) 09/12/2022  LYMPHOCYTES 16.2 (L) 09/12/2022  LYMPHABS 1.31 09/12/2022  MONOCYTES 5.5 09/12/2022  MONOABS 0.45 09/12/2022  EOSINOPHILS 3.2 09/12/2022  EOSABS 0.26 09/12/2022  BASOY 0.5 09/12/2022  BASOABS 0.04 09/12/2022 TODAY YOU RECEIVED:   palonosetron (ALOXI) injection 0.25 mg     dexamethasone (DECADRON) injection 10 mg     diphenhydrAMINE (BENADRYL) capsule 25 mg     famotidine (PEPCID) tablet 20 mg     PACLitaxeL (TAXOL) 66 mg in sodium chloride 0.9 % (DEHP-FREE) 100 mL infusion     sodium chloride 0.9% infusion     CARBOplatin (PARAPLATIN) 160 mg in sodium chloride 0.9% 250 mL infusion  .

## 2022-09-13 NOTE — Unmapped
ON TREATMENT VISIT NOTE Diagnosis: RUL NSCLCSite: RULCurrent RT dose: 1200 cGyPlanned RT Dose: 6600 cGyFx: 6Nursing Assessment: Pt tolerating treatments well/abdominal pain has improvedPhysical Exam:BP 103/62  - Pulse (!) 92  - Temp 97 ?F (36.1 ?C) (Temporal)  - Resp 18  - Wt 42.2 kg  - SpO2 98%  - BMI 16.90 kg/m? Pain Score:   0 - No pain Wt: 09/13/22 42.2 kg 09/12/22 42 kg 09/12/22 42 kg 09/09/22 44 kg 09/06/22 42.6 kg 08/12/22 45.2 kg A focused physical exam was performed and there were no focal deficits beyond baseline.Data Review:I have reviewed the patient's pertinent labs as resulted in the EMR.  Physician Assessment: Stable course with expected RT side effects. ED yesterday for abdominal pain x 3 days but left due to long waiting time. Already starting to feel better today. Pain generally stable but perhaps slightly better in R back since starting RT. No esophageal or respiratory toxicities yet. Plan:Continue RT.Pain management: continue current pain regimen. A review of treatment set-up, dosimetry, and IGRT images has been performed. Alferd Apa, MD, MPHAssociate Professor, Radiation OncologyYale School of Medicine

## 2022-09-13 NOTE — Unmapped
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 Osgood  Is 67 y.o. female active smoker (~50 pack-years) with a history of COPD and a now biopsy-proven NSCLC (keratinizing SCC) of the RUL with invasion of the posterior chest wall and ribs 5-6 (PD-L1 TPS 5%, at least cT3N0). She additionally has lesion in the LEFT lung currently under surveillance. 06/25/2022 [place] A/P showed findings concerning for colitis predominant involvement of the right colon and cecum. 07/01/2022 [place] chest showed RIGHT upper lobe mass with chest wall invasion. Mass measures 5.1 x 3.3 x 4.9 cm. This mass erodes into the posterior chest wall with adjacent erosion of the RIGHT posterior fifth and sixth ribs. 07/06/2022 FEV1 37% predicted, FEV1/FVC 35%, DLCO 20%. Severe obstruction, severely impaired diffusing capacity. 07/09/2022 MRI brain with no evidence of metastatic disease to the brain. 07/16/2022 FDG PET/[place] showed intensely hypermetabolic RIGHT upper lobe pleural based lung mass with chest wall and right 5-6th ribs invasion. Biapical hypermetabolic scarring with more prominent on the LEFT apical nodularity, nonspecific but concerning for malignant involvement.She met with Dr Maxcine Ham (med-onc) and Dr Valora Corporal (Rad onc) - decision reached ot proceed with concurrent chemo/RTCURRENT THERAPY: C1D1 carbo/taxol weekly w RT on 6/11/24INTERIM HISTORY: [name] presents to continue concurrent therapy today. Her abdominal pain is much improved. She was able to eat a roast beef sandwich last night. Did go tto the ED but left before any testing was done. She had a small formed normal BM this morning. Still has some pain to her right upper back related to her tumor. It responds to naproxen ut she is avoiding using this unless pain is severe. She denies significant cough or shortness of breath. She denies headaches, dizziness, fevers, chills, rash and neuropathy. REVIEW OF PAST MEDICAL,SURGICAL,SOCIAL,FAMILY HISTORY: reviewed and unchangedShe  has a past medical history of Colitis, COPD (chronic obstructive pulmonary disease) (HC Code), and Tobacco abuse.She has had a Left wrist surgery. 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 Continues to smoke with roughly 40 pack year history. FAMILY HISTORY:  Father, lung cancer; brother, lymphomaReview of SystemsPertinent items are noted in HPI.A 12- point review of systems, was otherwise negative except as stated in the HPI. ECOG 1ALLERGIES: PenicillinMEDICATIONS: Patient has a current medication list which includes the following prescription(s): b complex vitamins (B COMPLEX) tablet - Take 1 tablet by mouth daily, buPROPion XL (WELLBUTRIN XL) 150 mg 24 hr tablet - Take 1 tablet (150 mg total) by mouth every morning (Patient not taking: Reported on 08/04/2022), calcium carbonate/vitamin D3 (CALCIUM WITH VITAMIN D3 ORAL) - Take by mouth daily, EPCLUSA 400-100 mg per tablet, and prochlorperazine (COMPAZINE) 10 mg tablet - Take 1 tablet (10 mg total) by mouth every 6 (six) hours as needed (for nausea or vomiting) (Patient not taking: Reported on 09/09/2022), and the following Facility-Administered Medications: albuterol neb sol 2.5 mg/3 mL (0.083%) (PROVENTIL,VENTOLIN), CARBOplatin (PARAPLATIN) 160 mg in sodium chloride 0.9% 250 mL infusion, diphenhydrAMINE (BENADRYL) injection 50 mg, EPINEPHrine (EPI-PEN) auto-injector 0.3 mg, EPINEPHrine (EPI-PEN) auto-injector 0.3 mg, famotidine (PF) (PEPCID) 20 mg in sodium chloride 0.9% PF 5 mL Injection, hydrocortisone sodium succinate (Solu-CORTEF) injection 100 mg, PACLitaxeL (TAXOL) 66 mg in sodium chloride 0.9 % (DEHP-FREE) 100 mL infusion, and sodium chloride 0.9 % (new bag) bolus 500 mL.PHYSICAL EXAM:There were no vitals taken for this visit.Temp:  [97.4 ?F (36.3 ?C)-98.7 ?F (37.1 ?C)]  97.4 ?F (36.3 ?C)Pulse:  [82-89] 82Resp:  [18-20] 18BP: (108-111)/(66-71) 108/68SpO2:  [96 %-100 %] 96 %Device (Oxygen Therapy): room airPhysical ExamConstitutional:     Appearance: Normal appearance. HENT:    Head: Normocephalic and atraumatic. Eyes:    Conjunctiva/sclera: Conjunctivae normal. Cardiovascular:    Rate and Rhythm: Normal rate and regular rhythm. Pulmonary:    Effort: Pulmonary effort is normal.    Breath sounds: No wheezing, rhonchi or rales. Abdominal:    General: Bowel sounds are normal. There is no distension.    Palpations: Abdomen is soft.    Tenderness: There is abdominal tenderness (minimal residual tenderness). There is no guarding. Musculoskeletal:       General: Normal range of motion.    Cervical back: Normal range of motion.    Right lower leg: No edema.    Left lower leg: No edema.    Comments: TTP to right upper back Lymphadenopathy:    Cervical: No cervical adenopathy. Skin:   General: Skin is warm and dry.    Findings: No erythema or rash. Neurological:    General: No focal deficit present.    Mental Status: She is alert and oriented to person, place, and time. Psychiatric:       Mood and Affect: Mood normal.       Behavior: Behavior normal.       Thought Content: Thought content normal.       Judgment: Judgment normal. Data Review:Lab Results Component Value Date  WBC 8.1 09/12/2022  RBC 4.59 09/12/2022  HGB 13.9 09/12/2022  HCT 42.20 09/12/2022  MCV 91.9 09/12/2022  MCH 30.3 09/12/2022  MCHC 32.9 09/12/2022  PLT 273 09/12/2022  MPV 9.0 09/12/2022 Lab Results Component Value Date  GLU 84 09/12/2022  BUN 17 09/12/2022  CREATININE 0.51 09/12/2022  NA 136 09/12/2022  K 4.5 09/12/2022  CL 99 09/12/2022  CO2 25 09/12/2022 ALBUMIN 3.9 09/12/2022  PROT 7.2 09/12/2022  BILITOT 0.8 09/12/2022  ALKPHOS 62 09/12/2022  ALT 14 09/12/2022  GLOB 3.3 09/12/2022  CALCIUM 9.9 09/12/2022  IMPRESSION:  Malignant neoplasm of bronchus and lung (HC Code)  (primary encounter diagnosis)Ms Armand is a 33 yer old woman diagnosed in the spring of 2024 with 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 She did ultimately go to the ED but left before any testing was performed. 09/13/22 - Today she presents to continue with her second week of chemotherapy. Her abdominal pain is much improved, she was able to have a small normal BM this morning, and was able to eat a sandwich last night. Overall feeling much better today. Encouraged her to monitor her symptoms and to call for evaluation sooner should the abdominal pain return. Labs, vitals, ROS reviewed, physical exam completed. Patient meets criteria to proceed with treatment today. PLAN: NSCLC - Proceed with week 2 carbo/taxol today- Can continue daily RT- Will hydrate w saline today, order entered.Pain - abdominal pain much improved	- still has tumor related pain to her right upper back. Responds to naproxen but she is using this sparingly. Encouraged her to be more liberal with this if needed. Patient to call with new or worsening symptoms prior to the next visit. RTC weekly for chemotherapy.

## 2022-09-13 NOTE — Unmapped
Outreach to [employer]  to speak about recent ED Visit for abdominal pain. Outreach attempt was Successful Patient states they left the emergency room prior to being seen because wait time. Patient states that they are currently Feeling better Courtney Knox  was advised to follow up with PCP. Patient verbalizes understandingAfter reviewing the chart and speaking with the patient, Patient states they will follow up with PCP or specialistPt referred to ED by thoracic surg for abdominal pain, diarrhea and low appetite. She left after a long wait time. She states she is feeling much better today, has multiple appts today and is following up with her PCP/specialists. Pt will continue to monitor and call back with any new or worsening symptoms.Protocol launched:No

## 2022-09-13 NOTE — Unmapped
12:11 AM patient left after being bought to treatment area.

## 2022-09-14 ENCOUNTER — Other Ambulatory Visit: Payer: Self-pay

## 2022-09-14 ENCOUNTER — Inpatient Hospital Stay: Admit: 2022-09-14 | Discharge: 2022-09-14 | Payer: BLUE CROSS/BLUE SHIELD | Primary: Internal Medicine

## 2022-09-14 ENCOUNTER — Ambulatory Visit: Admit: 2022-09-14 | Payer: BLUE CROSS/BLUE SHIELD | Primary: Internal Medicine

## 2022-09-14 DIAGNOSIS — J449 Chronic obstructive pulmonary disease, unspecified: Secondary | ICD-10-CM

## 2022-09-14 DIAGNOSIS — Z510 Encounter for antineoplastic radiation therapy: Secondary | ICD-10-CM

## 2022-09-14 DIAGNOSIS — Z5111 Encounter for antineoplastic chemotherapy: Secondary | ICD-10-CM

## 2022-09-14 DIAGNOSIS — C3411 Malignant neoplasm of upper lobe, right bronchus or lung: Secondary | ICD-10-CM

## 2022-09-14 MED ORDER — TRAZODONE HCL 150 MG PO TABS: 150.0000 mg | ORAL_TABLET | Freq: Every day | ORAL | 1 refills | Status: DC

## 2022-09-15 ENCOUNTER — Encounter: Payer: Self-pay | Admitting: Nurse Practitioner

## 2022-09-15 ENCOUNTER — Inpatient Hospital Stay: Admit: 2022-09-15 | Discharge: 2022-09-15 | Payer: BLUE CROSS/BLUE SHIELD | Primary: Internal Medicine

## 2022-09-15 ENCOUNTER — Encounter: Admit: 2022-09-15 | Payer: PRIVATE HEALTH INSURANCE | Primary: Internal Medicine

## 2022-09-15 ENCOUNTER — Telehealth: Admit: 2022-09-15 | Payer: PRIVATE HEALTH INSURANCE | Attending: Adult Health | Primary: Internal Medicine

## 2022-09-15 ENCOUNTER — Telehealth: Admit: 2022-09-15 | Payer: PRIVATE HEALTH INSURANCE | Primary: Internal Medicine

## 2022-09-15 DIAGNOSIS — J449 Chronic obstructive pulmonary disease, unspecified: Secondary | ICD-10-CM

## 2022-09-15 DIAGNOSIS — Z5111 Encounter for antineoplastic chemotherapy: Secondary | ICD-10-CM

## 2022-09-15 DIAGNOSIS — C3411 Malignant neoplasm of upper lobe, right bronchus or lung: Secondary | ICD-10-CM

## 2022-09-15 DIAGNOSIS — Z510 Encounter for antineoplastic radiation therapy: Secondary | ICD-10-CM

## 2022-09-15 NOTE — Unmapped
INITIAL STUDIES:06/25/2022 [place] A/P showed findings concerning for colitis predominant involvement of the right colon and cecum.07/01/2022 [place] chest showed RIGHT upper lobe mass with chest wall invasion. Mass measures 5.1 x 3.3 x 4.9 cm. This mass erodes into the posterior chest wall with adjacent erosion of the RIGHT posterior fifth and sixth ribs.07/06/2022 FEV1 37% predicted, FEV1/FVC 35%, DLCO 20%. Severe obstruction, severely impaired diffusing capacity.07/09/2022 MRI brain with no evidence of metastatic disease to the brain.07/16/2022 FDG PET/[employer] showed intensely hypermetabolic RIGHT upper lobe pleural based lung mass with chest wall and right 5-6th ribs invasion. Biapical hypermetabolic scarring with more prominent on the LEFT apical nodularity, nonspecific but concerning for malignant involvement.07/21/22 Path penAccession #: [id number]        1) LYMPH NODE, 11L, FINE NEEDLE ASPIRATION/WASH:           - NEGATIVE FOR MALIGNANT CELLS      - POLYMORPHOUS POPULATION OF LYMPHOCYTES. 2) LYMPH NODE, STATION 7, FINE NEEDLE ASPIRATION/WASH:           - NEGATIVE FOR MALIGNANT CELLS      - POLYMORPHOUS POPULATION OF LYMPHOCYTES. 3) LYMPH NODE, 11R, FINE NEEDLE ASPIRATION/WASH:           - NEGATIVE FOR MALIGNANT CELLS      - POLYMORPHOUS POPULATION OF LYMPHOCYTES. PAST MEDICAL/ SURGICAL HISTORY:  Colitis, COPD (chronic obstructive pulmonary disease), osteoporosis. Left wrist surgery. 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 Continues to smoke with roughly 40 pack year history.FAMILY HISTORY:  Father, lung cancer; brother, lymphomaMEDICATIONS:  Medication list reviewedALLERGIES:  PCNREVIEW OF SYSTEMS:  As described below and in chart.  Remaining review of systems is negative.Ms. Krass appears well. Maintains an ECOG performance status of one. No new or worsening cough/ dyspnea on exertion, chest pain, abdominal pain, diarrhea or peripheral neuropathy. Appetite/ energy fair. Recently initiated therapy for hepatitis C.[place] imaging demonstrating slight progression of lung cancer discussedBloodwork reviewed. 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 Hepatitis CBlood work reviewed. Cycle 1 day 1 concurrent chemotherapy with daily thoracic irradiation today

## 2022-09-15 NOTE — Unmapped
[name] called with a recurrence of her abdominal pain.  Will plan to see her in clinic after XRT today as a problem visit; she was agreeable to this.Herold Harms, MDResident Physician, PGY-2Department of Therapeutic RadiologyMHB: [phone]

## 2022-09-15 NOTE — Unmapped
Pt having reoccuring symptoms after chemo and dosent want to go to the er and is requesting a call back [phone] or [phone] answering service

## 2022-09-15 NOTE — Unmapped
Patient c/o feeling constipated with RLQ and RUQ pain. Per patient, LBM 6/19 before RT, per patient, during and after RT, she felt something move in her right side of abdomen which she feels is due to something being blocked. Patient reported she also felt disoriented during and after RT, she did call and report this and will be seeing Dr. Simonne Maffucci today who will further work up these matters. Patient feels pressure in her head and abdomen, she is hydrating well, weight is stable, decrease intake of foods due to concern about constipation.Advised her to try foods w/ fiber and use Miralax once daily to see if this helps with her sxs. Patient can call to f/u if no BM by tomorrow.Patient also had a question about a bill for DOS 6/6. Reviewed chart w/ patient and informed her 6/6 DOS was for her [place] scans.

## 2022-09-15 NOTE — Unmapped
Radiation Oncology Problem Visit I again saw Courtney Knox before her XRT treatment today.  She is a 67 y.o. female active smoker (~50 pack-years) and patient of Dr. 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  She is currently undergoing ChemoRT for this, 6600 cGy in 33 fx.  After today, she will be s/p 8/33 treatments.I previously saw Courtney Knox earlier this week for abdominal pain.  At the time, I recommended an evaluation in the ED.  She initially refused but presented later that evening for evaluation, but left without being seen due to the wait.  See my prior documentation from 6/17 for more.Since then, she has had some improvement in her symptoms (See OTV note from 6/18) but she called the office today reporting recurrent abdominal pain.  She made it absolutely clear that she will not go to the emergency room for evaluation no matter what I say.  I offered to see her after her XRT treatment today to examine her and discuss next steps, to which she was agreeable.In clinic today, she was initially combative, refusing an exam and to stay in the hospital for imaging or further evaluation.  I was able to discuss her symptoms briefly.  She denied fever, nausea, and vomiting.  She denied diarrhea.  She states her abdominal pain is totally on the LEFT and not on the RIGHT at all.  Her last bowel movement was yesterday morning (6/19).  She states it is typically hard, round stools of varied size.I reviewed recent labwork from 6/17 and 6/18, including a normal urinalysis and a normal serum amylase and lipase.  I reviewed most recent liver function labs in CareEverywhere which predate both HCV antiviral therapy and ChemoRT (08/11/2022).She initially refused a physical exam altogether but after some discussion she allowed for a limited abdominal exam.PHYSICAL EXAM:Patient refused vitals.ECOG: 0General: Thin appearing woman in no apparent distress.HEENT: Normocephalic, atraumatic.  No conjunctival injection.  Sclera anicteric.  Vision grossly intact.Cardio: No cyanosis.Pulmonary: Patent airway.  No signs of respiratory distress.  On room air.Abdominal: Soft, non-distended.  Some tenderness to light palpation with associated fullness in the LLQ.  Diffusely non-tender otherwise.  No rebound or guarding.  No flank tenderness.Neuro: Awake.  Alert.  Affect appropriate.  Face symmetric.  Speech fluent.  Hearing is grossly intact.  Ambulatory.  Gait not assessed.In light of these findings, I recommended a trial of Miralax over the weekend.  I also encouraged increased p.o. hydration.  We will continue XRT today.At this time my differential includes constipation, medication effect, diverticulitis,diverticulosis, gastroenteritis, colitis.  I doubt cystitis, pyelonephritis, and nephrolithiasis due to recent normal UA.  Will discuss with Dr. Valora Corporal.Herold Harms, MDResident Physician, PGY-2Department of Therapeutic RadiologyMHB: [phone]

## 2022-09-16 ENCOUNTER — Inpatient Hospital Stay: Admit: 2022-09-16 | Discharge: 2022-09-16 | Payer: BLUE CROSS/BLUE SHIELD | Primary: Internal Medicine

## 2022-09-16 ENCOUNTER — Ambulatory Visit: Admit: 2022-09-16 | Payer: BLUE CROSS/BLUE SHIELD | Attending: Radiation Oncology | Primary: Internal Medicine

## 2022-09-16 DIAGNOSIS — C3411 Malignant neoplasm of upper lobe, right bronchus or lung: Secondary | ICD-10-CM

## 2022-09-16 DIAGNOSIS — Z5111 Encounter for antineoplastic chemotherapy: Secondary | ICD-10-CM

## 2022-09-16 DIAGNOSIS — Z510 Encounter for antineoplastic radiation therapy: Secondary | ICD-10-CM

## 2022-09-16 DIAGNOSIS — J449 Chronic obstructive pulmonary disease, unspecified: Secondary | ICD-10-CM

## 2022-09-17 ENCOUNTER — Ambulatory Visit: Admit: 2022-09-17 | Payer: BLUE CROSS/BLUE SHIELD | Primary: Internal Medicine

## 2022-09-17 DIAGNOSIS — Z5111 Encounter for antineoplastic chemotherapy: Secondary | ICD-10-CM

## 2022-09-17 DIAGNOSIS — C3411 Malignant neoplasm of upper lobe, right bronchus or lung: Secondary | ICD-10-CM

## 2022-09-17 DIAGNOSIS — J449 Chronic obstructive pulmonary disease, unspecified: Secondary | ICD-10-CM

## 2022-09-17 DIAGNOSIS — Z510 Encounter for antineoplastic radiation therapy: Secondary | ICD-10-CM

## 2022-09-19 ENCOUNTER — Ambulatory Visit
Admission: RE | Admit: 2022-09-19 | Discharge: 2022-09-19 | Disposition: A | Discharge status: Home / Self Care | Payer: Medicare Other | Source: Ambulatory Visit | Attending: Radiation Oncology | Admitting: Radiation Oncology

## 2022-09-19 ENCOUNTER — Telehealth: Payer: Self-pay | Admitting: Nurse Practitioner

## 2022-09-19 ENCOUNTER — Other Ambulatory Visit: Payer: Self-pay | Admitting: Nurse Practitioner

## 2022-09-19 ENCOUNTER — Encounter: Admit: 2022-09-19 | Payer: PRIVATE HEALTH INSURANCE | Primary: Internal Medicine

## 2022-09-19 ENCOUNTER — Inpatient Hospital Stay: Admit: 2022-09-19 | Discharge: 2022-09-19 | Payer: BLUE CROSS/BLUE SHIELD | Primary: Internal Medicine

## 2022-09-19 ENCOUNTER — Ambulatory Visit: Admit: 2022-09-19 | Payer: BLUE CROSS/BLUE SHIELD | Primary: Internal Medicine

## 2022-09-19 DIAGNOSIS — C3411 Malignant neoplasm of upper lobe, right bronchus or lung: Secondary | ICD-10-CM

## 2022-09-19 DIAGNOSIS — Z510 Encounter for antineoplastic radiation therapy: Secondary | ICD-10-CM

## 2022-09-19 DIAGNOSIS — Z5111 Encounter for antineoplastic chemotherapy: Secondary | ICD-10-CM

## 2022-09-19 DIAGNOSIS — C210 Malignant neoplasm of anus, unspecified: Secondary | ICD-10-CM

## 2022-09-19 DIAGNOSIS — J449 Chronic obstructive pulmonary disease, unspecified: Secondary | ICD-10-CM

## 2022-09-19 NOTE — Progress Notes (Signed)
Oley Balm, MD  Leodis Rains D PROCEDURE / BIOPSY REVIEW Date: 09/19/22  Requested Biopsy site: liver seg 2 lesion Reason for request: enlarging met? Imaging review: Best seen on MR 09/12/2022 and PETCT 05/30/22  Decision: Approved Imaging modality to perform: Ultrasound Schedule with: Moderate Sedation Schedule for: Any VIR  Additional comments: @VIR : prob good for subcostal approach with [place], also visible on non-con CT if needed  Please contact me with questions, concerns, or if issue pertaining to this request arise.  Dayne Oley Balm, MD [facility]

## 2022-09-19 NOTE — Telephone Encounter (Signed)
I contacted Alyssa Deleon regarding the recent MRI liver which shows enlargement of a liver lesion.  She understands Dr. Kalman Drape recommendation is to proceed with a biopsy and obtain a PET scan.  She is in agreement.  Referrals placed.

## 2022-09-19 NOTE — Progress Notes (Signed)
  Radiation Oncology         [phone] ________________________________  Name: Alyssa Deleon MRN: [id number]  Date of Service: 09/19/2022  DOB: 12-09-1955  Post Treatment Telephone Note  Diagnosis:   Squamous Cell Carcinoma of the Anus (as documented in provider EOT note)   The patient was not available for call today. Voicemail left.  The patient has scheduled follow up with her medical oncologist Dr. Truett Perna for ongoing surveillance as well, and follow up with physical therapy. The patient was encouraged to call if she develops concerns or questions regarding radiation.    Ruel Favors, LPN

## 2022-09-19 NOTE — Unmapped
SOCIAL WORK NOTEPatient Name: Courtney Knox Record Number: [id number] Date of Birth: May 18, 1957Medical Social Work Follow Up  [employer] Most Recent Value Admission Information  Document Type Progress Note Reason for Current Social Work Involvement Support/Coping Source of Information Patient, Medical Team Medical Team Comment thoracic oncology Record Reviewed Yes Level of Care Ambulatory What medium(s) of communication were used with patient/family/caregiver? Face-to-Face / In-Person Psychosocial issues requiring intervention introduction to social work services Psychosocial interventions 20 minutes were spent face to face with Courtney Knox during her infusion appointment. This is a late entry following our encounter on 6/18, during which I introduced myself and the role and scope of social work services in the ambulatory setting at [facility] and within the thoracic oncology team. Ms. Jaimes was initially pleasant during our encounter, discussing his lung cancer diagnosis. However, she quickly became somewhat agitated, describing frustration around her treatment today. I offered support surrounding her frustration with timing and her need to leave to perform certain errands, but tried to help her understand the background work in relation to administration of her treatment and what can contribute to delays. Ms. Vinal did not identify specific needs, and was otherwise eager to work with nursing in an effort to start her next medication. I provided her with my direct contact information and encouraged her to reach out for support or assistance surrounding any emerging needs. Collaborations thoracic oncology Specific referrals to enhance community supports (include existing and new resources) none Handoff Required? No Next Steps/Plan (including hand-off): Social work intervention is complete. Please reconsult social work for assistance with any emerging needs. Was the authorized person Charity fundraiser, legal guardian, and/or healthcare representative) notified? Other Signature: Jerral Ralph, LCSW Contact Information: [phone]

## 2022-09-20 ENCOUNTER — Other Ambulatory Visit: Payer: Self-pay | Admitting: Physician Assistant

## 2022-09-20 ENCOUNTER — Inpatient Hospital Stay: Admit: 2022-09-20 | Discharge: 2022-09-20 | Payer: BLUE CROSS/BLUE SHIELD | Primary: Internal Medicine

## 2022-09-20 ENCOUNTER — Ambulatory Visit: Admit: 2022-09-20 | Payer: BLUE CROSS/BLUE SHIELD | Primary: Internal Medicine

## 2022-09-20 ENCOUNTER — Encounter: Admit: 2022-09-20 | Payer: PRIVATE HEALTH INSURANCE | Attending: Adult Health | Primary: Internal Medicine

## 2022-09-20 ENCOUNTER — Ambulatory Visit: Admit: 2022-09-20 | Payer: BLUE CROSS/BLUE SHIELD | Attending: Adult Health | Primary: Internal Medicine

## 2022-09-20 DIAGNOSIS — Z720 Tobacco use: Secondary | ICD-10-CM

## 2022-09-20 DIAGNOSIS — Z5111 Encounter for antineoplastic chemotherapy: Secondary | ICD-10-CM

## 2022-09-20 DIAGNOSIS — Z510 Encounter for antineoplastic radiation therapy: Secondary | ICD-10-CM

## 2022-09-20 DIAGNOSIS — J449 Chronic obstructive pulmonary disease, unspecified: Secondary | ICD-10-CM

## 2022-09-20 DIAGNOSIS — K529 Noninfective gastroenteritis and colitis, unspecified: Secondary | ICD-10-CM

## 2022-09-20 DIAGNOSIS — C3411 Malignant neoplasm of upper lobe, right bronchus or lung: Secondary | ICD-10-CM

## 2022-09-20 DIAGNOSIS — F063 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, unspecified: Secondary | ICD-10-CM

## 2022-09-20 LAB — COMPREHENSIVE METABOLIC PANEL
BKR A/G RATIO: 1.2 x 1000/ÂµL (ref 1.0–2.2)
BKR ALANINE AMINOTRANSFERASE (ALT): 9 U/L (ref 10–35)
BKR ALBUMIN: 3.6 g/dL (ref 3.6–5.1)
BKR ALKALINE PHOSPHATASE: 54 U/L (ref 9–122)
BKR ANION GAP: 9 (ref 7–17)
BKR ASPARTATE AMINOTRANSFERASE (AST): 17 U/L (ref 10–35)
BKR AST/ALT RATIO: 1.9
BKR BILIRUBIN TOTAL: 0.5 mg/dL (ref <=1.2)
BKR BLOOD UREA NITROGEN: 12 mg/dL (ref 8–23)
BKR BUN / CREAT RATIO: 26.7 — ABNORMAL HIGH (ref 8.0–23.0)
BKR CALCIUM: 8.9 mg/dL (ref 8.8–10.2)
BKR CHLORIDE: 102 mmol/L (ref 98–107)
BKR CO2: 26 mmol/L (ref 20–30)
BKR CREATININE: 0.45 mg/dL (ref 0.40–1.30)
BKR EGFR, CREATININE (CKD-EPI 2021): >60 mL/min/{1.73_m2} (ref >=60)
BKR GLOBULIN: 3.1 g/dL (ref 2.0–3.9)
BKR GLUCOSE: 88 mg/dL (ref 70–100)
BKR POTASSIUM: 4.1 mmol/L (ref 3.3–5.3)
BKR PROTEIN TOTAL: 6.7 g/dL (ref 5.9–8.3)
BKR SODIUM: 137 mmol/L (ref 136–144)

## 2022-09-20 LAB — CBC WITH AUTO DIFFERENTIAL
BKR WAM ABSOLUTE IMMATURE GRANULOCYTES.: 0.02 x 1000/ÂµL (ref 0.00–0.30)
BKR WAM ABSOLUTE LYMPHOCYTE COUNT.: 0.84 x 1000/ÂµL (ref 0.60–3.70)
BKR WAM ABSOLUTE NRBC (2 DEC): 0 x 1000/ÂµL (ref 0.00–1.00)
BKR WAM ANALYZER ANC: 2.89 x 1000/ÂµL (ref 2.00–7.60)
BKR WAM BASOPHIL ABSOLUTE COUNT.: 0.03 x 1000/ÂµL (ref 0.00–1.00)
BKR WAM BASOPHILS: 0.7 % (ref 0.0–1.4)
BKR WAM EOSINOPHIL ABSOLUTE COUNT.: 0.18 x 1000/ÂµL (ref 0.00–1.00)
BKR WAM EOSINOPHILS: 4.1 % — ABNORMAL LOW (ref 0.0–5.0)
BKR WAM HEMATOCRIT (2 DEC): 37.2 % (ref 35.00–45.00)
BKR WAM HEMOGLOBIN: 12.5 g/dL (ref 11.7–15.5)
BKR WAM IMMATURE GRANULOCYTES: 0.5 % — ABNORMAL HIGH (ref 0.0–1.0)
BKR WAM LYMPHOCYTES: 19 % (ref 17.0–50.0)
BKR WAM MCH (PG): 30.6 pg (ref 27.0–33.0)
BKR WAM MCHC: 33.6 g/dL (ref 31.0–36.0)
BKR WAM MCV: 91.2 fL (ref 80.0–100.0)
BKR WAM MONOCYTE ABSOLUTE COUNT.: 0.46 x 1000/ÂµL (ref 0.00–1.00)
BKR WAM MONOCYTES: 10.4 % — ABNORMAL HIGH (ref 4.0–12.0)
BKR WAM MPV: 8.5 fL — ABNORMAL LOW (ref 8.0–12.0)
BKR WAM NEUTROPHILS: 65.3 % — ABNORMAL LOW (ref 39.0–72.0)
BKR WAM NUCLEATED RED BLOOD CELLS: 0 % — ABNORMAL LOW (ref 0.0–1.0)
BKR WAM PLATELETS: 313 x1000/ÂµL — ABNORMAL HIGH (ref 150–420)
BKR WAM RDW-CV: 12.1 % — ABNORMAL LOW (ref 11.0–15.0)
BKR WAM RED BLOOD CELL COUNT.: 4.08 M/ÂµL (ref 4.00–6.00)
BKR WAM WHITE BLOOD CELL COUNT: 4.4 x1000/ÂµL (ref 4.0–11.0)

## 2022-09-20 MED ORDER — PALONOSETRON 0.25 MG/5 ML INTRAVENOUS SOLUTION
0.25 mg/5 mL | Freq: Once | INTRAVENOUS | Status: CP
Start: 2022-09-20 — End: ?
  Administered 2022-09-20: 15:00:00 0.25 mL via INTRAVENOUS

## 2022-09-20 MED ORDER — FAMOTIDINE 4 MG/ML IN 0.9% SODIUM CHLORIDE (ADULT)
Freq: Once | INTRAVENOUS | Status: DC | PRN
Start: 2022-09-20 — End: 2022-09-20

## 2022-09-20 MED ORDER — ALBUTEROL SULFATE 2.5 MG/3 ML (0.083 %) SOLUTION FOR NEBULIZATION
2.5 mg /3 mL (0.083 %) | RESPIRATORY_TRACT | Status: DC | PRN
Start: 2022-09-20 — End: 2022-09-20

## 2022-09-20 MED ORDER — HYDROCORTISONE SOD SUCCINATE (PF) 100 MG/2 ML SOLUTION FOR INJECTION
100 mg/2 mL | Freq: Once | INTRAVENOUS | Status: DC | PRN
Start: 2022-09-20 — End: 2022-09-20

## 2022-09-20 MED ORDER — EPINEPHRINE 0.3 MG/0.3 ML INJECTION, AUTO-INJECTOR
0.3 mg/ mL | INTRAMUSCULAR | Status: DC | PRN
Start: 2022-09-20 — End: 2022-09-20

## 2022-09-20 MED ORDER — SODIUM CHLORIDE 0.9 % BOLUS (NEW BAG)
0.9 % | Freq: Once | INTRAVENOUS | Status: DC | PRN
Start: 2022-09-20 — End: 2022-09-20

## 2022-09-20 MED ORDER — DIPHENHYDRAMINE 50 MG/ML INJECTION (WRAPPED E-RX)
50 mg/mL | Freq: Once | INTRAVENOUS | Status: DC | PRN
Start: 2022-09-20 — End: 2022-09-20

## 2022-09-20 MED ORDER — DEXAMETHASONE 4 MG TABLET
4 mg | Freq: Once | ORAL | Status: CP
Start: 2022-09-20 — End: ?
  Administered 2022-09-20: 15:00:00 4 mg via ORAL

## 2022-09-20 MED ORDER — CARBOPLATIN INFUSION (BY AUC) (DOSE>=125MG)
Freq: Once | INTRAVENOUS | Status: CP
Start: 2022-09-20 — End: ?
  Administered 2022-09-20: 17:00:00 250.000 mL/h via INTRAVENOUS

## 2022-09-20 MED ORDER — PACLITAXEL 0-75 MG IN 100 ML INFUSION
Freq: Once | INTRAVENOUS | Status: CP
Start: 2022-09-20 — End: ?
  Administered 2022-09-20: 16:00:00 100.000 mL/h via INTRAVENOUS

## 2022-09-20 MED ORDER — SODIUM CHLORIDE 0.9 % INTRAVENOUS SOLUTION
INTRAVENOUS | Status: AC
Start: 2022-09-20 — End: ?

## 2022-09-20 NOTE — Unmapped
Patient arrived for D15C1 Paclitaxel + Carboplatin weekly with XRTPIV placed and labs drawn on 2nd floor labPatient evaluated and cleared for treatment with Legrand Rams APRNPatient reports that constipation improved with miralax. Reports moderate PO intake. Denies nausea/vomiting at home. Reports dry mouth - advised to increase fluid intake. Assessment completed in flowsheetsPatient requested to not have IVF today for hydration. Legrand Rams APRN awarePremedicated with aloxi and PO decadronPaclitaxel 60mg  administered over 1 hour- pt tolerated without issueCarboplatin 150mg  administered over 30 minutes -pt tolerated without issuePIV flushed pre and post each infusion- brisk blood return presentPIV removedPatient discharged home in stable condition

## 2022-09-20 NOTE — Unmapped
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 female active smoker (~50 pack-years) with a history of COPD and a now biopsy-proven NSCLC (keratinizing SCC) of the RUL with invasion of the posterior chest wall and ribs 5-6 (PD-L1 TPS 5%, at least cT3N0). She additionally has lesion in the LEFT lung currently under surveillance. 06/25/2022 [place] A/P showed findings concerning for colitis predominant involvement of the right colon and cecum. 07/01/2022 [place] chest showed RIGHT upper lobe mass with chest wall invasion. Mass measures 5.1 x 3.3 x 4.9 cm. This mass erodes into the posterior chest wall with adjacent erosion of the RIGHT posterior fifth and sixth ribs. 07/06/2022 FEV1 37% predicted, FEV1/FVC 35%, DLCO 20%. Severe obstruction, severely impaired diffusing capacity. 07/09/2022 MRI brain with no evidence of metastatic disease to the brain. 07/16/2022 FDG PET/[place] showed intensely hypermetabolic RIGHT upper lobe pleural based lung mass with chest wall and right 5-6th ribs invasion. Biapical hypermetabolic scarring with more prominent on the LEFT apical nodularity, nonspecific but concerning for malignant involvement.She met with Dr Maxcine Ham (med-onc) and Dr Valora Corporal (Rad onc) - decision reached ot proceed with concurrent chemo/RTCURRENT THERAPY: C1D1 carbo/taxol weekly w RT on 6/11/24INTERIM HISTORY: 67 Courtney Knox presents to continue concurrent therapy today. Her abdominal pain is much improved with miralax. Took it for about 4 days and reports that her constipation is much improved. Reminded her she will need to restart this after treatment today due constipating to effect of anti-emetics. She has mild esophagitis, is managing this by selecting softer foods, smaller bites. Still has some pain to her right upper back related to her tumor. She has been off naproxen for a few weeks and is only using this unless pain is severe. She denies significant cough or shortness of breath. Mouth is dry, trying to increase fluid intake.She denies headaches, dizziness, fevers, chills, rash and neuropathy. REVIEW OF PAST MEDICAL,SURGICAL,SOCIAL,FAMILY HISTORY: reviewed and unchangedShe  has a past medical history of Colitis, COPD (chronic obstructive pulmonary disease) (HC Code), and Tobacco abuse.She has had a Left wrist surgery. 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 Continues to smoke with roughly 40 pack year history. FAMILY HISTORY:  Father, lung cancer; brother, lymphomaReview of SystemsPertinent items are noted in HPI.A 12- point review of systems, was otherwise negative except as stated in the HPI. ECOG 1ALLERGIES: PenicillinMEDICATIONS: Patient has a current medication list which includes the following prescription(s): b complex vitamins (B COMPLEX) tablet - Take 1 tablet by mouth daily, buPROPion XL (WELLBUTRIN XL) 150 mg 24 hr tablet - Take 1 tablet (150 mg total) by mouth every morning (Patient not taking: Reported on 08/04/2022), calcium carbonate/vitamin D3 (CALCIUM WITH VITAMIN D3 ORAL) - Take by mouth daily, EPCLUSA 400-100 mg per tablet, and prochlorperazine (COMPAZINE) 10 mg tablet - Take 1 tablet (10 mg total) by mouth every 6 (six) hours as needed (for nausea or vomiting) (Patient not taking: Reported on 09/09/2022).PHYSICAL EXAM:BP 119/65 (Site: r a, Position: Sitting)  - Pulse 75  - Temp 98.2 ?F (36.8 ?C) (Temporal)  - Resp 18  - Ht 5' 1.5 (1.562 m)  - Wt 40.7 kg  - SpO2 99%  - BMI 16.69 kg/m?  Physical ExamConstitutional:     Appearance: Normal appearance. HENT:    Head: Normocephalic and atraumatic. Eyes:    Conjunctiva/sclera: Conjunctivae normal. Cardiovascular:  Rate and Rhythm: Normal rate and regular rhythm. Pulmonary:    Effort: Pulmonary effort is normal.    Breath sounds: No wheezing, rhonchi or rales. Abdominal:    General: Bowel sounds are normal. There is no distension.    Palpations: Abdomen is soft.    Tenderness: There is no abdominal tenderness. Musculoskeletal:       General: Normal range of motion.    Cervical back: Normal range of motion.    Right lower leg: No edema.    Left lower leg: No edema.    Comments: TTP to right upper back Lymphadenopathy:    Cervical: No cervical adenopathy. Skin:   General: Skin is warm and dry.    Findings: No erythema or rash. Neurological:    General: No focal deficit present.    Mental Status: She is alert and oriented to person, place, and time. Psychiatric:       Mood and Affect: Mood normal.       Behavior: Behavior normal.       Thought Content: Thought content normal.       Judgment: Judgment normal. Data Review:Lab Results Component Value Date  WBC 8.1 09/12/2022  RBC 4.59 09/12/2022  HGB 13.9 09/12/2022  HCT 42.20 09/12/2022  MCV 91.9 09/12/2022  MCH 30.3 09/12/2022  MCHC 32.9 09/12/2022  PLT 273 09/12/2022  MPV 9.0 09/12/2022 Lab Results Component Value Date  GLU 84 09/12/2022  BUN 17 09/12/2022  CREATININE 0.51 09/12/2022  NA 136 09/12/2022  K 4.5 09/12/2022  CL 99 09/12/2022  CO2 25 09/12/2022  ALBUMIN 3.9 09/12/2022  PROT 7.2 09/12/2022  BILITOT 0.8 09/12/2022  ALKPHOS 62 09/12/2022  ALT 14 09/12/2022  GLOB 3.3 09/12/2022  CALCIUM 9.9 09/12/2022  IMPRESSION:  Malignant neoplasm of bronchus and lung (HC Code)  (primary encounter diagnosis)Courtney Knox is a 67 yer old woman diagnosed in the spring of 2024 with 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 She did ultimately go to the ED but left before any testing was performed. 09/13/22 - Due for the second week of chemotherapy. Her abdominal pain is much improved, she was able to have a small normal BM this morning, and was able to eat a sandwich last night. Overall feeling much better today. Encouraged her to monitor her symptoms and to call for evaluation sooner should the abdominal pain return. 09/20/22 - Today she presents to continue treatment. Reports side effects and symptoms as noted above. Constipation and abdominal pain much improved with miralax. Labs, vitals, ROS reviewed, physical exam completed. Patient meets criteria to proceed with treatment today. PLAN: NSCLC - Proceed with week 2 carbo/taxol today- Can continue daily RT- Will hydrate w saline today, order entered.Constipation - continue miralax daily as needed. Esophagitis - mild. Low threshold to start lidocaine if this worsens.Pain - abdominal pain much improved/resolved	- still has tumor related pain to her right upper back. Responds to naproxen but she is using this sparingly. Again encouraged her to be more liberal with this if needed. PO intake decreased - offered nutrition assistance but she declined. 		- will focus on frequent small meals, maximizing calorie intake. Patient to call with new or worsening symptoms prior to the next visit. RTC weekly for chemotherapy.

## 2022-09-21 ENCOUNTER — Encounter (HOSPITAL_COMMUNITY): Payer: Medicare Other

## 2022-09-21 ENCOUNTER — Inpatient Hospital Stay: Admit: 2022-09-21 | Discharge: 2022-09-21 | Payer: BLUE CROSS/BLUE SHIELD | Primary: Internal Medicine

## 2022-09-21 DIAGNOSIS — Z510 Encounter for antineoplastic radiation therapy: Secondary | ICD-10-CM

## 2022-09-22 ENCOUNTER — Telehealth: Admit: 2022-09-22 | Payer: PRIVATE HEALTH INSURANCE | Attending: Adult Health | Primary: Internal Medicine

## 2022-09-22 ENCOUNTER — Inpatient Hospital Stay: Admit: 2022-09-22 | Discharge: 2022-09-22 | Payer: BLUE CROSS/BLUE SHIELD | Primary: Internal Medicine

## 2022-09-22 DIAGNOSIS — Z510 Encounter for antineoplastic radiation therapy: Secondary | ICD-10-CM

## 2022-09-22 NOTE — Unmapped
Patient is aware of appointment added (infusion) on 7/1 in [place]. Patient is aware of time difference between provider and treatment visit.

## 2022-09-23 ENCOUNTER — Encounter (HOSPITAL_COMMUNITY)
Admission: RE | Admit: 2022-09-23 | Discharge: 2022-09-23 | Disposition: A | Discharge status: Home / Self Care | Payer: Medicare Other | Source: Ambulatory Visit | Attending: Nurse Practitioner | Admitting: Nurse Practitioner

## 2022-09-23 ENCOUNTER — Inpatient Hospital Stay: Admit: 2022-09-23 | Discharge: 2022-09-23 | Payer: BLUE CROSS/BLUE SHIELD | Primary: Internal Medicine

## 2022-09-23 DIAGNOSIS — Z510 Encounter for antineoplastic radiation therapy: Secondary | ICD-10-CM

## 2022-09-23 DIAGNOSIS — C210 Malignant neoplasm of anus, unspecified: Secondary | ICD-10-CM | POA: Insufficient documentation

## 2022-09-23 LAB — GLUCOSE, CAPILLARY: Glucose-Capillary: 102 mg/dL — ABNORMAL HIGH (ref 70–99)

## 2022-09-23 MED ORDER — FLUDEOXYGLUCOSE F - 18 (FDG) INJECTION
7.7800 | Freq: Once | INTRAVENOUS | Status: AC
  Administered 2022-09-23: 7.78 via INTRAVENOUS

## 2022-09-23 NOTE — Unmapped
ON TREATMENT VISIT NOTE Diagnosis: NSCLCSite: RULCurrent RT dose: 2800 cGyPlanned RT Dose: 6000 cGyFx: 14Nursing Assessment: Patient denies skin issues, reports sore throat and mild fatigue, abdominal pain resolvedDoing well. Abdominal pain resolved. Intermittent odynophagia. Chest wall pain improving. PE:BP 118/74  - Pulse (!) 14  - Temp 98.1 ?F (36.7 ?C)  - Resp 18  - Wt 40.7 kg  - SpO2 98%  - BMI 16.69 kg/m? Pain Score:   0 - No painWt Readings from Last 3 Encounters: 09/23/22 40.7 kg 09/20/22 40.7 kg 09/13/22 42.2 kg   In NAD. Karnofsky Score: Normal activity with effort,Karnofsky Score: 80Data Review:Lab Results Component Value Date  WBC 4.4 09/20/2022  RBC 4.08 09/20/2022  HGB 12.5 09/20/2022  HCT 37.20 09/20/2022  MCV 91.2 09/20/2022  MCH 30.6 09/20/2022  MCHC 33.6 09/20/2022  PLT 313 09/20/2022  MPV 8.5 09/20/2022 Lab Results Component Value Date  GLU 88 09/20/2022  BUN 12 09/20/2022  CREATININE 0.45 09/20/2022  CO2 26 09/20/2022  CL 102 09/20/2022  NA 137 09/20/2022  K 4.1 09/20/2022  CALCIUM 8.9 09/20/2022 Lab Results Component Value Date  GLU 88 09/20/2022  BUN 12 09/20/2022  CREATININE 0.45 09/20/2022  NA 137 09/20/2022  K 4.1 09/20/2022  CL 102 09/20/2022  CO2 26 09/20/2022  ALBUMIN 3.6 09/20/2022  PROT 6.7 09/20/2022  BILITOT 0.5 09/20/2022  ALKPHOS 54 09/20/2022  ALT 9 (L) 09/20/2022  GLOB 3.1 09/20/2022  CALCIUM 8.9 09/20/2022  ASSESSMENT AND PLAN:Tolerating RT well. Start viscous lidocaine for pain plan. Continue radiation therapy.A review of treatment set-up, dosimetry, and portal images has been performed.

## 2022-09-25 DIAGNOSIS — K5900 Constipation, unspecified: Secondary | ICD-10-CM

## 2022-09-25 DIAGNOSIS — Z79630 Long term (current) use of alkylating agent: Secondary | ICD-10-CM

## 2022-09-25 DIAGNOSIS — Z9221 Personal history of antineoplastic chemotherapy: Secondary | ICD-10-CM

## 2022-09-25 DIAGNOSIS — C3411 Malignant neoplasm of upper lobe, right bronchus or lung: Secondary | ICD-10-CM

## 2022-09-25 DIAGNOSIS — K209 Esophagitis, unspecified without bleeding: Secondary | ICD-10-CM

## 2022-09-25 DIAGNOSIS — F17210 Nicotine dependence, cigarettes, uncomplicated: Secondary | ICD-10-CM

## 2022-09-25 DIAGNOSIS — Z923 Personal history of irradiation: Secondary | ICD-10-CM

## 2022-09-25 DIAGNOSIS — C3490 Malignant neoplasm of unspecified part of unspecified bronchus or lung: Secondary | ICD-10-CM

## 2022-09-25 DIAGNOSIS — Z79899 Other long term (current) drug therapy: Secondary | ICD-10-CM

## 2022-09-25 DIAGNOSIS — J449 Chronic obstructive pulmonary disease, unspecified: Secondary | ICD-10-CM

## 2022-09-25 DIAGNOSIS — Z801 Family history of malignant neoplasm of trachea, bronchus and lung: Secondary | ICD-10-CM

## 2022-09-25 DIAGNOSIS — R682 Dry mouth, unspecified: Secondary | ICD-10-CM

## 2022-09-25 DIAGNOSIS — Z880 Allergy status to penicillin: Secondary | ICD-10-CM

## 2022-09-25 DIAGNOSIS — G893 Neoplasm related pain (acute) (chronic): Secondary | ICD-10-CM

## 2022-09-25 DIAGNOSIS — C7951 Secondary malignant neoplasm of bone: Secondary | ICD-10-CM

## 2022-09-25 DIAGNOSIS — Z5111 Encounter for antineoplastic chemotherapy: Secondary | ICD-10-CM

## 2022-09-25 DIAGNOSIS — M810 Age-related osteoporosis without current pathological fracture: Secondary | ICD-10-CM

## 2022-09-25 DIAGNOSIS — Z79633 Long term (current) use of mitotic inhibitor: Secondary | ICD-10-CM

## 2022-09-25 DIAGNOSIS — Z510 Encounter for antineoplastic radiation therapy: Secondary | ICD-10-CM

## 2022-09-26 ENCOUNTER — Encounter: Admit: 2022-09-26 | Payer: PRIVATE HEALTH INSURANCE | Primary: Internal Medicine

## 2022-09-26 ENCOUNTER — Inpatient Hospital Stay: Admit: 2022-09-26 | Discharge: 2022-09-26 | Payer: BLUE CROSS/BLUE SHIELD | Primary: Internal Medicine

## 2022-09-26 ENCOUNTER — Encounter: Admit: 2022-09-26 | Payer: PRIVATE HEALTH INSURANCE | Attending: Adult Health | Primary: Internal Medicine

## 2022-09-26 ENCOUNTER — Ambulatory Visit: Admit: 2022-09-26 | Payer: BLUE CROSS/BLUE SHIELD | Attending: Adult Health | Primary: Internal Medicine

## 2022-09-26 DIAGNOSIS — C3490 Malignant neoplasm of unspecified part of unspecified bronchus or lung: Secondary | ICD-10-CM

## 2022-09-26 DIAGNOSIS — C3411 Malignant neoplasm of upper lobe, right bronchus or lung: Secondary | ICD-10-CM

## 2022-09-26 DIAGNOSIS — Z923 Personal history of irradiation: Secondary | ICD-10-CM

## 2022-09-26 DIAGNOSIS — Z87891 Personal history of nicotine dependence: Secondary | ICD-10-CM

## 2022-09-26 DIAGNOSIS — J449 Chronic obstructive pulmonary disease, unspecified: Secondary | ICD-10-CM

## 2022-09-26 DIAGNOSIS — Z801 Family history of malignant neoplasm of trachea, bronchus and lung: Secondary | ICD-10-CM

## 2022-09-26 DIAGNOSIS — Z807 Family history of other malignant neoplasms of lymphoid, hematopoietic and related tissues: Secondary | ICD-10-CM

## 2022-09-26 DIAGNOSIS — K209 Esophagitis, unspecified without bleeding: Secondary | ICD-10-CM

## 2022-09-26 DIAGNOSIS — Z880 Allergy status to penicillin: Secondary | ICD-10-CM

## 2022-09-26 DIAGNOSIS — M546 Pain in thoracic spine: Secondary | ICD-10-CM

## 2022-09-26 DIAGNOSIS — Z79630 Long term (current) use of alkylating agent: Secondary | ICD-10-CM

## 2022-09-26 DIAGNOSIS — Z9221 Personal history of antineoplastic chemotherapy: Secondary | ICD-10-CM

## 2022-09-26 DIAGNOSIS — Z510 Encounter for antineoplastic radiation therapy: Secondary | ICD-10-CM

## 2022-09-26 DIAGNOSIS — Z5111 Encounter for antineoplastic chemotherapy: Secondary | ICD-10-CM

## 2022-09-26 DIAGNOSIS — Z79633 Long term (current) use of mitotic inhibitor: Secondary | ICD-10-CM

## 2022-09-26 DIAGNOSIS — K5900 Constipation, unspecified: Secondary | ICD-10-CM

## 2022-09-26 DIAGNOSIS — Z79899 Other long term (current) drug therapy: Secondary | ICD-10-CM

## 2022-09-26 DIAGNOSIS — Z720 Tobacco use: Secondary | ICD-10-CM

## 2022-09-26 DIAGNOSIS — K529 Noninfective gastroenteritis and colitis, unspecified: Secondary | ICD-10-CM

## 2022-09-26 LAB — COMPREHENSIVE METABOLIC PANEL
BKR A/G RATIO: 1.3 (ref 1.0–2.2)
BKR ALANINE AMINOTRANSFERASE (ALT): 12 U/L (ref 10–35)
BKR ALBUMIN: 4.1 g/dL (ref 3.6–5.1)
BKR ALKALINE PHOSPHATASE: 58 U/L (ref 9–122)
BKR ANION GAP: 11 % (ref 7–17)
BKR ASPARTATE AMINOTRANSFERASE (AST): 27 U/L (ref 10–35)
BKR AST/ALT RATIO: 2.3
BKR BILIRUBIN TOTAL: 0.4 mg/dL (ref <=1.2)
BKR BLOOD UREA NITROGEN: 13 mg/dL (ref 8–23)
BKR BUN / CREAT RATIO: 28.9 — ABNORMAL HIGH (ref 8.0–23.0)
BKR CALCIUM: 9.6 mg/dL (ref 8.8–10.2)
BKR CHLORIDE: 102 mmol/L (ref 98–107)
BKR CO2: 25 mmol/L (ref 20–30)
BKR CREATININE: 0.45 mg/dL (ref 0.40–1.30)
BKR EGFR, CREATININE (CKD-EPI 2021): >60 mL/min/{1.73_m2} (ref >=60)
BKR GLOBULIN: 3.2 g/dL (ref 2.0–3.9)
BKR GLUCOSE: 102 mg/dL — ABNORMAL HIGH (ref 70–100)
BKR POTASSIUM: 4.1 mmol/L (ref 3.3–5.3)
BKR PROTEIN TOTAL: 7.3 g/dL (ref 5.9–8.3)
BKR SODIUM: 138 mmol/L (ref 136–144)

## 2022-09-26 LAB — CBC WITH AUTO DIFFERENTIAL
BKR WAM ABSOLUTE IMMATURE GRANULOCYTES.: 0.02 x 1000/ÂµL (ref 0.00–0.30)
BKR WAM ABSOLUTE LYMPHOCYTE COUNT.: 0.52 x 1000/ÂµL (ref 0.60–3.70)
BKR WAM ABSOLUTE NRBC (2 DEC): 0 x 1000/ÂµL (ref 0.00–1.00)
BKR WAM ANALYZER ANC: 2.88 x 1000/ÂµL (ref <=0.9)
BKR WAM BASOPHIL ABSOLUTE COUNT.: 0.04 x 1000/ÂµL (ref 0.00–1.00)
BKR WAM BASOPHILS: 1.1 % (ref 0.0–1.4)
BKR WAM EOSINOPHIL ABSOLUTE COUNT.: 0.08 x 1000/ÂµL (ref 0.00–1.00)
BKR WAM EOSINOPHILS: 2.1 % (ref 0.0–5.0)
BKR WAM HEMATOCRIT (2 DEC): 41.5 % (ref 35.00–45.00)
BKR WAM HEMOGLOBIN: 13.9 g/dL (ref <46.0)
BKR WAM IMMATURE GRANULOCYTES: 0.5 % (ref 0.0–1.0)
BKR WAM LYMPHOCYTES: 13.7 % (ref 17.0–50.0)
BKR WAM MCH (PG): 30.4 pg (ref 27.0–33.0)
BKR WAM MCHC: 33.5 g/dL (ref 31.0–36.0)
BKR WAM MCV: 90.8 fL (ref 80.0–100.0)
BKR WAM MONOCYTE ABSOLUTE COUNT.: 0.25 x 1000/ÂµL (ref 0.00–1.00)
BKR WAM MONOCYTES: 6.6 % (ref 4.0–12.0)
BKR WAM MPV: 8.8 fL (ref 8.0–12.0)
BKR WAM NEUTROPHILS: 76 % (ref 39.0–72.0)
BKR WAM NUCLEATED RED BLOOD CELLS: 0 % (ref 0.0–1.0)
BKR WAM PLATELETS: 323 x1000/ÂµL (ref 150–420)
BKR WAM RDW-CV: 12.5 % (ref 11.0–15.0)
BKR WAM RED BLOOD CELL COUNT.: 4.57 M/ÂµL (ref 4.00–6.00)
BKR WAM WHITE BLOOD CELL COUNT: 3.8 x1000/ÂµL (ref 4.0–11.0)

## 2022-09-26 MED ORDER — PALONOSETRON 0.25 MG/5 ML INTRAVENOUS SOLUTION
0.25 mg/5 mL | Freq: Once | INTRAVENOUS | Status: CP
Start: 2022-09-26 — End: ?
  Administered 2022-09-26: 17:00:00 0.25 mL via INTRAVENOUS

## 2022-09-26 MED ORDER — DEXAMETHASONE 4 MG TABLET
4 mg | Freq: Once | ORAL | Status: CP
Start: 2022-09-26 — End: ?
  Administered 2022-09-26: 17:00:00 4 mg via ORAL

## 2022-09-26 MED ORDER — CARBOPLATIN INFUSION (BY AUC) (DOSE>=125MG)
Freq: Once | INTRAVENOUS | Status: CP
Start: 2022-09-26 — End: ?
  Administered 2022-09-26: 19:00:00 250.000 mL/h via INTRAVENOUS

## 2022-09-26 MED ORDER — PACLITAXEL 0-75 MG IN 100 ML INFUSION
Freq: Once | INTRAVENOUS | Status: CP
Start: 2022-09-26 — End: ?
  Administered 2022-09-26: 17:00:00 100.000 mL/h via INTRAVENOUS

## 2022-09-26 NOTE — Unmapped
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 female active smoker (~50 pack-years) with a history of COPD and a now biopsy-proven NSCLC (keratinizing SCC) of the RUL with invasion of the posterior chest wall and ribs 5-6 (PD-L1 TPS 5%, at least cT3N0). She additionally has lesion in the LEFT lung currently under surveillance. 06/25/2022 [place] A/P showed findings concerning for colitis predominant involvement of the right colon and cecum. 07/01/2022 [place] chest showed RIGHT upper lobe mass with chest wall invasion. Mass measures 5.1 x 3.3 x 4.9 cm. This mass erodes into the posterior chest wall with adjacent erosion of the RIGHT posterior fifth and sixth ribs. 07/06/2022 FEV1 37% predicted, FEV1/FVC 35%, DLCO 20%. Severe obstruction, severely impaired diffusing capacity. 07/09/2022 MRI brain with no evidence of metastatic disease to the brain. 07/16/2022 FDG PET/[place] showed intensely hypermetabolic RIGHT upper lobe pleural based lung mass with chest wall and right 5-6th ribs invasion. Biapical hypermetabolic scarring with more prominent on the LEFT apical nodularity, nonspecific but concerning for malignant involvement.She met with Dr Maxcine Ham (med-onc) and Dr Valora Corporal (Rad onc) - decision reached ot proceed with concurrent chemo/RTCURRENT THERAPY: C1D1 carbo/taxol weekly w RT on 6/11/24INTERIM HISTORY: Courtney Knox presents to continue concurrent therapy today. Esophagitis pain is improved with topical meds she picked up OTC. Rad onc discussed sending viscous lidocaine but prescription was not yet sent. I offered to send this today but she declined, prefers to continue the meds she has. Will continue diet modifications (selecting softer foods, smaller bites) to help her increase calorie intake. Her abdominal pain is much improved, did not need to take any more doses of miralax. Again reminded her that she need to restart this after treatment today due constipating to effect of anti-emetics. Still has some pain to her right upper back related to her tumor. She has been off naproxen for a few weeks and is only using this unless pain is severe. She denies significant cough or shortness of breath. Her husband has rash - combination of bug bites, chiggers and poison ivy per patient. He was seen at urgent care and given several meds. She thinks she has some as well to her right upper chest and upper back. Unclear if this may be due to RT as well. Recommended that she check with rad-onc prior to using any topical meds.  She has had decreased hearing int he left ear for several months, is trying to see ENT locally but is overwhelmed with so many medical appts. I offered to refer her to [facility] ENT program, she wants to think about this. Provided mychart message with link to [place] [facility] providers for her to research per her request. Also provided letter to excuse her from jury duty summons due to health concerns. She denies headaches, dizziness, fevers, chills, rash and neuropathy. REVIEW OF PAST MEDICAL,SURGICAL,SOCIAL,FAMILY HISTORY: reviewed and unchangedShe  has a past medical history of Colitis, COPD (chronic obstructive pulmonary disease) (HC Code), and Tobacco abuse.She has had a Left wrist surgery. 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 Continues to smoke with roughly 40 pack year history. FAMILY HISTORY:  Father, lung cancer; brother, lymphomaReview of SystemsPertinent items are noted in HPI.A 12- point review of systems, was otherwise negative except as stated in the HPI. ECOG 1ALLERGIES: PenicillinMEDICATIONS: Patient has a current medication list which includes the  following prescription(s): b complex vitamins (B COMPLEX) tablet - Take 1 tablet by mouth daily, buPROPion XL (WELLBUTRIN XL) 150 mg 24 hr tablet - Take 1 tablet (150 mg total) by mouth every morning (Patient not taking: Reported on 08/04/2022), calcium carbonate/vitamin D3 (CALCIUM WITH VITAMIN D3 ORAL) - Take by mouth daily, EPCLUSA 400-100 mg per tablet, and prochlorperazine (COMPAZINE) 10 mg tablet - Take 1 tablet (10 mg total) by mouth every 6 (six) hours as needed (for nausea or vomiting) (Patient not taking: Reported on 09/09/2022).PHYSICAL EXAM:BP (!) 95/56  - Pulse 80  - Temp 97 ?F (36.1 ?C) (Temporal)  - Resp 17  - Wt 40.5 kg  - SpO2 94%  - BMI 16.58 kg/m?  Physical ExamConstitutional:     Appearance: Normal appearance. HENT:    Head: Normocephalic and atraumatic. Eyes:    Conjunctiva/sclera: Conjunctivae normal. Cardiovascular:    Rate and Rhythm: Normal rate and regular rhythm. Pulmonary:    Effort: Pulmonary effort is normal.    Breath sounds: No wheezing, rhonchi or rales.    Comments: Breath sounds decreased to lung basesAbdominal:    General: Bowel sounds are normal. There is no distension.    Palpations: Abdomen is soft.    Tenderness: There is no abdominal tenderness. Musculoskeletal:       General: Normal range of motion.    Cervical back: Normal range of motion.    Right lower leg: No edema.    Left lower leg: No edema.    Comments: TTP to right upper back Lymphadenopathy:    Cervical: No cervical adenopathy. Skin:   General: Skin is warm and dry.    Findings: Rash (small red bumps to right upper chest and upper back) present. No erythema. Neurological:    General: No focal deficit present.    Mental Status: She is alert and oriented to person, place, and time. Psychiatric:       Mood and Affect: Mood normal.       Behavior: Behavior normal.       Thought Content: Thought content normal.       Judgment: Judgment normal. Data Review:Lab Results Component Value Date  WBC 3.8 (L) 09/26/2022  RBC 4.57 09/26/2022  HGB 13.9 09/26/2022  HCT 41.50 09/26/2022  MCV 90.8 09/26/2022  MCH 30.4 09/26/2022  MCHC 33.5 09/26/2022  PLT 323 09/26/2022  MPV 8.8 09/26/2022 Lab Results Component Value Date  GLU 102 (H) 09/26/2022  BUN 13 09/26/2022  CREATININE 0.45 09/26/2022  NA 138 09/26/2022  K 4.1 09/26/2022  CL 102 09/26/2022  CO2 25 09/26/2022  ALBUMIN 4.1 09/26/2022  PROT 7.3 09/26/2022  BILITOT 0.4 09/26/2022  ALKPHOS 58 09/26/2022  ALT 12 09/26/2022  GLOB 3.2 09/26/2022  CALCIUM 9.6 09/26/2022  IMPRESSION:  Esophagitis  (primary encounter diagnosis) Malignant neoplasm of bronchus and lung (HC Code)Courtney Knox is a 8 yer old woman diagnosed in the spring of 2024 with 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 She did ultimately go to the ED but left before any testing was performed. 09/13/22 - Due for the second week of chemotherapy. Her abdominal pain is much improved, she was able to have a small normal BM this morning, and was able to eat a sandwich last night. Overall feeling much better today. Encouraged her to monitor her  symptoms and to call for evaluation sooner should the abdominal pain return. 09/26/22 - Today she presents to continue treatment. Reports side effects and symptoms as noted above. Mild esophagitis managed with OTC meds, does not want to start lidocaine yet. Labs, vitals, ROS reviewed, physical exam completed. Patient meets criteria to proceed with treatment today. PLAN: NSCLC - Proceed with day 22 carbo/taxol today (week 4 of tx)- Can continue daily RT- Will hydrate w saline today due to low BP, order entered.Constipation - continue miralax daily as needed. Esophagitis - mild. Low threshold to start lidocaine if this worsens. Continue OTC topical meds for now.Pain - abdominal pain much improved/resolved	- still has tumor related pain to her right upper back. Responds to naproxen but she is using this sparingly. Again encouraged her to be more liberal with this if needed. PO intake decreased - offered nutrition assistance but she declined. 		- will focus on frequent small meals, maximizing calorie intake. Decreased hearing - will consider referral to ENT/audiology at next visit. Patient to call with new or worsening symptoms prior to the next visit. RTC weekly for chemotherapy. On the day of this patient's encounter, a total of 58 minutes was personally spent by me.  This does not include any resident/fellow teaching time, or any time spent performing a procedural service.

## 2022-09-26 NOTE — Unmapped
Received Pt from home for C1-D22 of Taxol/Carbo given via [facility] with positive blood return. Tolerated infusion without incidence. Piv IV was removeand Pt was discharged to XRT  in stable condition.

## 2022-09-27 ENCOUNTER — Telehealth: Payer: Self-pay | Admitting: Nurse Practitioner

## 2022-09-27 ENCOUNTER — Ambulatory Visit: Admit: 2022-09-27 | Payer: BLUE CROSS/BLUE SHIELD | Primary: Internal Medicine

## 2022-09-27 ENCOUNTER — Inpatient Hospital Stay: Admit: 2022-09-27 | Discharge: 2022-09-27 | Payer: BLUE CROSS/BLUE SHIELD | Primary: Internal Medicine

## 2022-09-27 ENCOUNTER — Telehealth: Admit: 2022-09-27 | Payer: PRIVATE HEALTH INSURANCE | Primary: Internal Medicine

## 2022-09-27 DIAGNOSIS — Z510 Encounter for antineoplastic radiation therapy: Secondary | ICD-10-CM

## 2022-09-27 DIAGNOSIS — C3411 Malignant neoplasm of upper lobe, right bronchus or lung: Secondary | ICD-10-CM

## 2022-09-27 DIAGNOSIS — Z5111 Encounter for antineoplastic chemotherapy: Secondary | ICD-10-CM

## 2022-09-27 NOTE — Unmapped
Chief Complaint Patient presents with  Abdominal Pain   Patient c/o abdominal pain  since Friday. Patient diagnosed with lung cancer. Had chemo 6 days ago and had radiation today. (-) nausea. (-) vomiting. HPI/PE:66 y/o F PMHx COPD and NSCLC getting carbo/taxol weekly w RT (last session 6/11)  presenting with abd pain x 2days.+ decreased PO intake, decreased UOP, loose stools, right upper back pain which worsened s/p radiation  Physical ExamED Triage Vitals [09/12/22 2034]BP: 111/69Pulse: 87Pulse from  O2 sat: n/aResp: 20Temp: 98.7 ?F (37.1 ?C)Temp src: OralSpO2: 100 % BP 111/69  - Pulse 87  - Temp 98.7 ?F (37.1 ?C) (Oral)  - Resp 20  - Wt 42 kg  - SpO2 100%  - BMI 15.88 kg/m? Physical Exam ProceduresAttestation/Critical Care  ED DispositionNo disposition selected since last refresh of note.

## 2022-09-27 NOTE — Unmapped
TCT to Infection Prevention on 09/26/22. Patient informed treating RN that her significant other had Chagas and she may be infected as well.Per Infection Prevention mode of transmission for Chagas is not from one human to another as its etiology is a parasite. There are no additional cleaning guidelines of the treatment chair or pod required; standard cleaning per unit protocol is recommended.

## 2022-09-28 ENCOUNTER — Other Ambulatory Visit: Payer: Self-pay | Admitting: Radiology

## 2022-09-28 ENCOUNTER — Telehealth: Admit: 2022-09-28 | Payer: PRIVATE HEALTH INSURANCE | Attending: Hematology | Primary: Internal Medicine

## 2022-09-28 ENCOUNTER — Ambulatory Visit: Admit: 2022-09-28 | Payer: BLUE CROSS/BLUE SHIELD | Primary: Internal Medicine

## 2022-09-28 DIAGNOSIS — Z5111 Encounter for antineoplastic chemotherapy: Secondary | ICD-10-CM

## 2022-09-28 DIAGNOSIS — C3411 Malignant neoplasm of upper lobe, right bronchus or lung: Secondary | ICD-10-CM

## 2022-09-28 DIAGNOSIS — K769 Liver disease, unspecified: Secondary | ICD-10-CM

## 2022-09-28 DIAGNOSIS — Z510 Encounter for antineoplastic radiation therapy: Secondary | ICD-10-CM

## 2022-09-28 NOTE — Unmapped
RTC to pt who needed her appt date and time .

## 2022-09-28 NOTE — Unmapped
Received call from [name] she stated she would like a call back from [place] she has some questions about her treatment.

## 2022-09-29 NOTE — Progress Notes (Addendum)
Referring Physician: Doyce Loose  Supervising Physician: Suttle,D  Patient Status:  [facility]  Chief Complaint:  "I'm getting a liver biopsy"  Subjective:  Pt known to IR team from PICC placements on 06/03/22 and 07/01/22. She is a 67 yo female with PMH sig for remote left  breast cancer, chronic pain syndrome, COPD, GAD, DDD, renal stones, depression, HLD, psoriasis, PTSD, stroke and 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 Recent PET scan has revealed:  1. 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 2. Tracer avid lesion involving the anus is again noted, slightly decreased in size and tracer avidity. 3. No tracer avid lymph nodes within the neck, chest, abdomen or pelvis. 4. Aortic Atherosclerosis (ICD10-I70.0) and Emphysema  She is scheduled today for image guided liver lesion biopsy. She denies fever,HA, back pain,N/V or bleeding; she does have some occ ant lower chest /epigastric pain, abdominal bloating, diarrhea, dyspnea with exertion, peripheral neuropathy.     Past Medical History:  Diagnosis Date   Breast cancer (HCC)    Cataract, left 09/29/2017   Chronic pain syndrome, on Neurontin 09/30/2017   Dry eye syndrome of both eyes, on Restasis 09/30/2017   Emphysema lung (HCC)    Family history of breast cancer    Family history of melanoma    Family history of ovarian cancer    Family history of prostate cancer    Family history of throat cancer    Frequent urinary tract infections    GAD (generalized anxiety disorder) 03/11/2015   Genital herpes    History of chicken pox    History of degenerative disc disease 10/27/2002   Kidney stone    Knee osteonecrosis, 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   Major depression, recurrent, chronic (HCC), followed by Psych, on Wellbutrin, Cymbalta 09/30/2017   Mixed hyperlipidemia, on Zetia and Zocor  09/30/2017   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   MVP (mitral valve prolapse) 03/13/2015   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   Nuclear sclerosis 06/10/2013   Panic disorder, followed by Psych, on Xanax 09/30/2017   Personal history of breast cancer    Psoriasis    PTSD (post-traumatic stress disorder) 03/11/2015   Stroke (HCC)    Total knee replacement status, 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 Authorizing Provider  ALPRAZolam Prudy Feeler) 1 MG tablet TAKE 2 TABLETS BY MOUTH EVERY  NIGHT AT BEDTIME AS NEEDED FOR  ANXIETY 09/20/22   Allwardt, Crist Infante, PA-C  aspirin EC 81 MG tablet Take 1 tablet (81 mg total) by mouth daily. Swallow whole. 05/31/21   Lyn Records, MD  buPROPion (WELLBUTRIN XL) 300 MG 24 hr tablet TAKE 1 TABLET BY MOUTH DAILY 03/18/22   Allwardt, Crist Infante, PA-C  Calcium Carbonate-Vitamin D 600-5 MG-MCG CAPS Take by mouth.    [provider]  denosumab (PROLIA) 60 MG/ML SOSY injection Inject 60 mg into  the skin every 6 (six) months.    [provider]  diphenoxylate-atropine (LOMOTIL) 2.5-0.025 MG tablet Take 1 tablet by mouth 4 (four) times daily as needed for diarrhea or loose stools. 07/11/22   Ronny Bacon, PA-C  DULoxetine (CYMBALTA) 60 MG capsule TAKE 1 CAPSULE BY MOUTH DAILY 03/18/22   Allwardt, Crist Infante, PA-C  ezetimibe (ZETIA) 10 MG tablet Take 1 tablet (10 mg total) by mouth  daily. 04/21/22   Allwardt, Crist Infante, PA-C  gabapentin (NEURONTIN) 300 MG capsule TAKE 1 CAPSULE BY MOUTH TWICE  DAILY 08/02/22   Allwardt, Alyssa M, PA-C  loperamide (IMODIUM A-D) 2 MG tablet Take 2 mg by mouth 4 (four) times daily.    [provider]  LORazepam (ATIVAN) 0.5 MG tablet Take 1 tablet by mouth or dissolve q 4-6 hours as needed for nausea if Zofran/Compazine are not working. Do not take Xanax within 4 hours. 07/11/22   Ronny Bacon, PA-C  magic mouthwash (nystatin, diphenhydrAMINE, alum & mag hydroxide) suspension mixture Swish and spit 5 mL by mouth as needed for mouth pain. Patient not taking: Reported on 09/13/2022 06/06/22     ondansetron (ZOFRAN) 8 MG tablet Take 1 tablet (8 mg total) by mouth every 8 (eight) hours as needed for nausea or vomiting. 06/03/22   Ladene Artist, MD  prochlorperazine (COMPAZINE) 10 MG tablet Take 1 tablet (10 mg total) by mouth every 6 (six) hours as needed for nausea or vomiting. 06/03/22   Ladene Artist, MD  simvastatin (ZOCOR) 40 MG tablet TAKE 1 TABLET BY MOUTH DAILY 01/24/22   Allwardt, Alyssa M, PA-C  traZODone (DESYREL) 150 MG tablet Take 1 tablet (150 mg total) by mouth at bedtime. 09/14/22   Allwardt, Alyssa M, PA-C  TYRVAYA 0.03 MG/ACT SOLN Place 1 spray into both nostrils 2 (two) times daily. 02/22/22   [provider]     Vital Signs: Today's Vitals   09/30/22 1148 09/30/22 1207  BP: 112/66   Pulse: 79   Resp: 20   Temp: 98.1 F (36.7 C)   TempSrc: Oral   SpO2: 93%   Weight:  160 lb 12.8 oz (72.9 kg)  Height:  5\' 7"  (1.702 m)   Body mass index is 25.18 kg/m.;s    Code Status: FULL CODE  Physical Exam: awake/alert; chest- CTA bilat; heart- RRR; abd- soft,+BS, some diffuse tenderness to palpation;no LE edema  Imaging: No results found.  Labs:  CBC: Recent Labs    07/12/22 1353 07/18/22 0825 08/16/22 0937 09/09/22 1742  WBC 3.1* 2.6* 4.8 5.2  HGB 10.5* 11.2* 12.4 10.3*  HCT 31.6* 33.2*  37.3 31.5*  PLT 183 182 206 208    COAGS: No results for input(s): "INR", "APTT" in the last 8760 hours.  BMP: Recent Labs    07/12/22 1353 07/18/22 0825 09/06/22 1022 09/09/22 1742  NA 140 142 140 139  K 3.7 4.3 4.9 4.0  CL 104 105 104 104  CO2 28 27 29 25   GLUCOSE 103* 94 107* 92  BUN 10 9 11 13   CALCIUM 9.0 9.0 9.4 9.3  CREATININE 0.62 0.63 0.86 0.83  GFRNONAA >60 >60 >60 >60    LIVER FUNCTION TESTS: Recent Labs    06/17/22 0816 07/04/22 0859 07/18/22 0825 09/06/22 1022  BILITOT 0.6 0.7 0.7 0.6  AST 11* 10* 12* 9*  ALT 6 6 8  <5  ALKPHOS 50 52 46 58  PROT 6.6 6.3* 6.6 6.4*  ALBUMIN 4.2 4.2 4.0 4.0  Assessment and Plan: 67 yo female with PMH sig for remote left  breast cancer, chronic pain syndrome, COPD, GAD, DDD, renal stones, depression, panic disorder, HLD, psoriasis, PTSD, stroke and 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 Recent PET scan has revealed:  1. 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 2. Tracer avid lesion involving the anus is again noted, slightly decreased in size and tracer avidity. 3. No tracer avid lymph nodes within the neck, chest, abdomen or pelvis. 4. Aortic Atherosclerosis (ICD10-I70.0) and Emphysema  She is scheduled today for image guided liver lesion biopsy.Risks and benefits of procedure was discussed with the patient  including, but not limited to bleeding, infection, damage to adjacent structures or low yield requiring additional tests.  All of the questions were answered and there is agreement to proceed.  Consent signed and in chart.    Electronically Signed: D. Jeananne Rama, PA-C 09/29/2022, 4:41 PM   I spent a total of 25 Minutes at the the patient's bedside AND on the patient's hospital floor or unit, greater than 50% of which was counseling/coordinating care for image  guided liver lesion biopsy

## 2022-09-30 ENCOUNTER — Other Ambulatory Visit: Payer: Self-pay

## 2022-09-30 ENCOUNTER — Ambulatory Visit (HOSPITAL_COMMUNITY)
Admission: RE | Admit: 2022-09-30 | Discharge: 2022-09-30 | Disposition: A | Discharge status: Home / Self Care | Payer: Medicare Other | Source: Ambulatory Visit | Attending: Nurse Practitioner | Admitting: Nurse Practitioner

## 2022-09-30 ENCOUNTER — Encounter (HOSPITAL_COMMUNITY): Payer: Self-pay

## 2022-09-30 ENCOUNTER — Ambulatory Visit: Admit: 2022-09-30 | Payer: BLUE CROSS/BLUE SHIELD | Attending: Radiation Oncology | Primary: Internal Medicine

## 2022-09-30 ENCOUNTER — Telehealth
Admit: 2022-09-30 | Payer: PRIVATE HEALTH INSURANCE | Attending: Student in an Organized Health Care Education/Training Program | Primary: Internal Medicine

## 2022-09-30 ENCOUNTER — Telehealth: Admit: 2022-09-30 | Payer: PRIVATE HEALTH INSURANCE | Attending: Hematology | Primary: Internal Medicine

## 2022-09-30 ENCOUNTER — Ambulatory Visit: Admit: 2022-09-30 | Payer: BLUE CROSS/BLUE SHIELD | Primary: Internal Medicine

## 2022-09-30 DIAGNOSIS — Z510 Encounter for antineoplastic radiation therapy: Secondary | ICD-10-CM

## 2022-09-30 DIAGNOSIS — Z5111 Encounter for antineoplastic chemotherapy: Secondary | ICD-10-CM

## 2022-09-30 DIAGNOSIS — C3411 Malignant neoplasm of upper lobe, right bronchus or lung: Secondary | ICD-10-CM

## 2022-09-30 DIAGNOSIS — L409 Psoriasis, unspecified: Secondary | ICD-10-CM | POA: Insufficient documentation

## 2022-09-30 DIAGNOSIS — Z8673 Personal history of transient ischemic attack (TIA), and cerebral infarction without residual deficits: Secondary | ICD-10-CM | POA: Diagnosis not present

## 2022-09-30 DIAGNOSIS — I700 Atherosclerosis of aorta: Secondary | ICD-10-CM | POA: Insufficient documentation

## 2022-09-30 DIAGNOSIS — F411 Generalized anxiety disorder: Secondary | ICD-10-CM | POA: Diagnosis not present

## 2022-09-30 DIAGNOSIS — Z853 Personal history of malignant neoplasm of breast: Secondary | ICD-10-CM | POA: Insufficient documentation

## 2022-09-30 DIAGNOSIS — J439 Emphysema, unspecified: Secondary | ICD-10-CM | POA: Diagnosis not present

## 2022-09-30 DIAGNOSIS — C210 Malignant neoplasm of anus, unspecified: Secondary | ICD-10-CM | POA: Diagnosis present

## 2022-09-30 DIAGNOSIS — F4310 Post-traumatic stress disorder, unspecified: Secondary | ICD-10-CM | POA: Insufficient documentation

## 2022-09-30 DIAGNOSIS — G894 Chronic pain syndrome: Secondary | ICD-10-CM | POA: Diagnosis not present

## 2022-09-30 DIAGNOSIS — E785 Hyperlipidemia, unspecified: Secondary | ICD-10-CM | POA: Insufficient documentation

## 2022-09-30 DIAGNOSIS — C787 Secondary malignant neoplasm of liver and intrahepatic bile duct: Secondary | ICD-10-CM | POA: Diagnosis not present

## 2022-09-30 DIAGNOSIS — Z87442 Personal history of urinary calculi: Secondary | ICD-10-CM | POA: Diagnosis not present

## 2022-09-30 DIAGNOSIS — K769 Liver disease, unspecified: Secondary | ICD-10-CM

## 2022-09-30 HISTORY — DX: Unspecified osteoarthritis, unspecified site: M19.90

## 2022-09-30 LAB — CBC WITH DIFFERENTIAL/PLATELET
Abs Immature Granulocytes: 0.02 K/uL (ref 0.00–0.07)
Basophils Absolute: 0 K/uL (ref 0.0–0.1)
Basophils Relative: 0 %
Eosinophils Absolute: 0.1 K/uL (ref 0.0–0.5)
Eosinophils Relative: 2 %
HCT: 32.6 % — ABNORMAL LOW (ref 36.0–46.0)
Hemoglobin: 10.4 g/dL — ABNORMAL LOW (ref 12.0–15.0)
Immature Granulocytes: 1 %
Lymphocytes Relative: 12 %
Lymphs Abs: 0.5 K/uL — ABNORMAL LOW (ref 0.7–4.0)
MCH: 32.5 pg (ref 26.0–34.0)
MCHC: 31.9 g/dL (ref 30.0–36.0)
MCV: 101.9 fL — ABNORMAL HIGH (ref 80.0–100.0)
Monocytes Absolute: 0.5 K/uL (ref 0.1–1.0)
Monocytes Relative: 11 %
Neutro Abs: 3.2 K/uL (ref 1.7–7.7)
Neutrophils Relative %: 74 %
Platelets: 183 K/uL (ref 150–400)
RBC: 3.2 MIL/uL — ABNORMAL LOW (ref 3.87–5.11)
RDW: 13.1 % (ref 11.5–15.5)
WBC: 4.3 K/uL (ref 4.0–10.5)
nRBC: 0 % (ref 0.0–0.2)

## 2022-09-30 LAB — COMPREHENSIVE METABOLIC PANEL WITH GFR
ALT: 6 U/L (ref 0–44)
AST: 13 U/L — ABNORMAL LOW (ref 15–41)
Albumin: 3.6 g/dL (ref 3.5–5.0)
Alkaline Phosphatase: 61 U/L (ref 38–126)
Anion gap: 8 (ref 5–15)
BUN: 10 mg/dL (ref 8–23)
CO2: 25 mmol/L (ref 22–32)
Calcium: 8.5 mg/dL — ABNORMAL LOW (ref 8.9–10.3)
Chloride: 104 mmol/L (ref 98–111)
Creatinine, Ser: 0.8 mg/dL (ref 0.44–1.00)
GFR, Estimated: >60 mL/min
Glucose, Bld: 111 mg/dL — ABNORMAL HIGH (ref 70–99)
Potassium: 3.7 mmol/L (ref 3.5–5.1)
Sodium: 137 mmol/L (ref 135–145)
Total Bilirubin: 0.6 mg/dL (ref 0.3–1.2)
Total Protein: 6.7 g/dL (ref 6.5–8.1)

## 2022-09-30 LAB — PROTIME-INR
INR: 1.1 (ref 0.8–1.2)
Prothrombin Time: 14.2 seconds (ref 11.4–15.2)

## 2022-09-30 MED ORDER — LIDOCAINE HCL 1 % IJ SOLN
INTRAMUSCULAR | Status: AC
  Filled 2022-09-30: qty 20

## 2022-09-30 MED ORDER — MIDAZOLAM HCL 2 MG/2ML IJ SOLN
INTRAMUSCULAR | Status: AC | PRN
  Administered 2022-09-30: 1 mg via INTRAVENOUS

## 2022-09-30 MED ORDER — FENTANYL CITRATE (PF) 100 MCG/2ML IJ SOLN
INTRAMUSCULAR | Status: AC | PRN
  Administered 2022-09-30 (×2): 50 ug via INTRAVENOUS

## 2022-09-30 MED ORDER — DIPHENHYDRAMINE HCL 50 MG/ML IJ SOLN
INTRAMUSCULAR | Status: AC | PRN
  Administered 2022-09-30: 25 mg via INTRAVENOUS

## 2022-09-30 MED ORDER — DIPHENHYDRAMINE HCL 50 MG/ML IJ SOLN
INTRAMUSCULAR | Status: AC
  Filled 2022-09-30: qty 1

## 2022-09-30 MED ORDER — MIDAZOLAM HCL 2 MG/2ML IJ SOLN
INTRAMUSCULAR | Status: AC
  Filled 2022-09-30: qty 2

## 2022-09-30 MED ORDER — SODIUM CHLORIDE 0.9 % IV SOLN: INTRAVENOUS | Status: DC

## 2022-09-30 MED ORDER — GELATIN ABSORBABLE 12-7 MM EX MISC
CUTANEOUS | Status: AC
  Filled 2022-09-30: qty 1

## 2022-09-30 MED ORDER — FENTANYL CITRATE (PF) 100 MCG/2ML IJ SOLN
INTRAMUSCULAR | Status: AC
  Filled 2022-09-30: qty 2

## 2022-09-30 NOTE — Procedures (Signed)
Interventional Radiology Procedure Note  Procedure: Ultrasound guided liver mass biopsy  Findings: Please refer to procedural dictation for full description. 18 ga core x2 from left lobe mass.  Gelfoam slurry needle track embolization.  Complications: None immediate  Estimated Blood Loss: < 5 mL  Recommendations: 3 hour bedrest. Follow up Pathology results.   Marliss Coots, MD

## 2022-09-30 NOTE — Discharge Instructions (Signed)
Liver Biopsy, Care After  May remove dressing or bandaid and shower tomorrow.  Keep site clean and dry. Replace with clean dressing or bandaid as necessary.   Urgent needs - Interventional Radiology clinic [phone]  After a liver biopsy, it is common to have these things in the area where the biopsy was done. You may: Have pain. Feel sore. Have bruising. You may also feel tired for a few days. Follow these instructions at home: Medicines Take over-the-counter and prescription medicines only as told by your doctor. If you were prescribed an antibiotic medicine, take it as told by your doctor. Do not stop taking the antibiotic, even if you start to feel better. Do not take medicines that may thin your blood. These medicines include aspirin and ibuprofen. Take them only if your doctor tells you to. If told, take steps to prevent problems with pooping (constipation). You may need to: Drink enough fluid to keep your pee (urine) pale yellow. Take medicines. You will be told what medicines to take. Eat foods that are high in fiber. These include beans, whole grains, and fresh fruits and vegetables. Limit foods that are high in fat and sugar. These include fried or sweet foods. Ask your doctor if you should avoid driving or using machines while you are taking your medicine. Caring for your incision Follow instructions from your doctor about how to take care of your cut from surgery (incisions). Make sure you: Wash your hands with soap and water for at least 20 seconds before and after you change your bandage. If you cannot use soap and water, use hand sanitizer. Change your bandage. Leavestitches or skin glue in place for at least two weeks. Leave tape strips alone unless you are told to take them off. You may trim the edges of the tape strips if they curl up. Check your incision every day for signs of infection. Check for: Redness, swelling, or more pain. Fluid or blood. Warmth. Pus or a  bad smell. Do not take baths, swim, or use a hot tub. Ask your doctor about taking showers or sponge baths. Activity Rest at home for 1-2 days, or as told by your doctor. Get up to take short walks every 1 to 2 hours. Ask for help if you feel weak or unsteady. Do not lift anything that is heavier than 10 lb (4.5 kg), or the limit that you are told. Do not play contact sports for 2 weeks after the procedure. Return to your normal activities as told by your doctor. Ask what activities are safe for you. General instructions Do not drink alcohol in the first week after the procedure. Plan to have a responsible adult care for you for the time you are told after you leave the hospital or clinic. This is important. It is up to you to get the results of your procedure. Ask how to get your results when they are ready. Keep all follow-up visits.   Contact a doctor if: You have more bleeding in your incision. Your incision swells, or is red and more painful. You have fluid that comes from your incision. You develop a rash. You have fever or chills. Get help right away if: You have swelling, bloating, or pain in your belly (abdomen). You get dizzy or faint. You vomit or you feel like vomiting. You have trouble breathing or feel short of breath. You have chest pain. You have problems talking or seeing. You have trouble with your balance or moving your arms or   legs. These symptoms may be an emergency. Get help right away. Call your local emergency services (911 in the [place] Do not wait to see if the symptoms will go away. Do not drive yourself to the hospital. Summary After the procedure, it is common to have pain, soreness, bruising, and tiredness. Your doctor will tell you how to take care of yourself at home. Change your bandage, take your medicines, and limit your activities as told by your doctor. Call your doctor if you have symptoms of infection. Get help right away if your belly swells,  your cut bleeds a lot, or you have trouble talking or breathing. This information is not intended to replace advice given to you by your health care provider. Make sure you discuss any questions you have with your healthcare provider. Document Revised: 01/27/2020 Document Reviewed: 01/27/2020 Elsevier Patient Education  2022 Elsevier Inc.  Moderate Conscious Sedation, Adult, Care After This sheet gives you information about how to care for yourself after your procedure. Your health care provider may also give you more specific instructions. If you have problems or questions, contact your health careprovider. What can I expect after the procedure? After the procedure, it is common to have: Sleepiness for several hours. Impaired judgment for several hours. Difficulty with balance. Vomiting if you eat too soon. Follow these instructions at home: For the time period you were told by your health care provider: Rest. Do not participate in activities where you could fall or become injured. Do not drive or use machinery. Do not drink alcohol. Do not take sleeping pills or medicines that cause drowsiness. Do not make important decisions or sign legal documents. Do not take care of children on your own. Eating and drinking  Follow the diet recommended by your health care provider. Drink enough fluid to keep your urine pale yellow. If you vomit: Drink water, juice, or soup when you can drink without vomiting. Make sure you have little or no nausea before eating solid foods.  General instructions Take over-the-counter and prescription medicines only as told by your health care provider. Have a responsible adult stay with you for the time you are told. It is important to have someone help care for you until you are awake and alert. Do not smoke. Keep all follow-up visits as told by your health care provider. This is important. Contact a health care provider if: You are still sleepy or having  trouble with balance after 24 hours. You feel light-headed. You keep feeling nauseous or you keep vomiting. You develop a rash. You have a fever. You have redness or swelling around the IV site. Get help right away if: You have trouble breathing. You have new-onset confusion at home. Summary After the procedure, it is common to feel sleepy, have impaired judgment, or feel nauseous if you eat too soon. Rest after you get home. Know the things you should not do after the procedure. Follow the diet recommended by your health care provider and drink enough fluid to keep your urine pale yellow. Get help right away if you have trouble breathing or new-onset confusion at home. This information is not intended to replace advice given to you by your health care provider. Make sure you discuss any questions you have with your healthcare provider. Document Revised: 07/12/2019 Document Reviewed: 02/07/2019 Elsevier Patient Education  2022 Elsevier Inc.       

## 2022-09-30 NOTE — Telephone Encounter
Revd call from daughter  Hospital doctor. They are very concerned for Courtney Knox.  Says she is not  eating, sleeping and is very confused and forgetful. Says that [name] is very angry  and frustrated w everyone. Her Dad is w [name] now, she is on her way over but they are looking for any information they can get on how to handle this sitiuation.This all started this week. Last 4 daysSay she Hyper focused and things she can't remember,.

## 2022-09-30 NOTE — Telephone Encounter
We received a message today from the patient's daughter, [name], regarding concerns about patient's overall health status and reluctance to continue with chemoradiation. After our intake coordinator spoke with [name], we reached out to speak with the patient's husband, [name] who reiterated similar concerns regarding a change in the patient's behavior and concerns about psychosis. I was able to directly speak with the patient after the phone was handed to her by her husband. She reported having a difficult time tolerating the side effects of chemoradiation and that it was also simultaneously difficult to keep track of certain household tasks. She reported having a loss of taste and fatigue leading to significant weight loss since starting treatment. She stated that her phone and computer were both not working and that nobody was available to get them fixed. I advised her to come to the hospital to undergo evaluation but she declined. When asked if she understood the potential consequences of stopping treatment including likely clinical deterioration and hospitalization, she expressed that she is aware but that it is not possible to resume treatment given how occupied she currently is with addressing household matters. She expressed an interest to keep living and ultimately was agreeable to re-evaluating early next week about resuming treatment. Of note, I asked for permission to speak with her family members, which she declined. This message was conveyed to our staff as a means of ensuring the confidentiality of patient's current medical status. Discussed with Dr. Valora Corporal. Carter Kitten, MDChief Resident, PGY-5Department of Therapeutic [phone]) [phone]

## 2022-09-30 NOTE — Telephone Encounter
RTC to pt's daughter earlier today.  Per daughter pt is very angry, yelling, can't remember where she puts things, and pt thinking the stove is backwards, her cell phone and home phone not working, her car is broke and can't be driven.  Per daughter you cannot reason with her.  Pt is receiving carbo/takol weekly along with XRT, per daughter pt has missed the last 2 RT appt's.  Daughter was unable to answer any clinical questions as she does not live in her parents home.  I than called the husband who reported the same things.  Husband was driving so I told him I would call back to ask Deb some questions.  I than called later and spoke with pt who was very irrational stating her phones are broke, her car is broke.  Pt yelling at me stating I am keeping her from doing things.  I asked pt about H/A, visual changes, n/v, balance issues.  She denied all s/s except visual changes.  Pt continued to yell at everyone in the home about various things.  I again spoke to husband stated I am not sure what is going on except she is acting psychotic.  I agreed with husband and I advised that pt go to ED to be assessed and I am afraid for everyone's safety.  Husband refused.  I again stated it is the medical team's advice that pt go to ED.  I than called the daughter to let her know what I thought and advised that they call an ambulance to transport her to ED.

## 2022-10-03 ENCOUNTER — Inpatient Hospital Stay
Admit: 2022-10-03 | Discharge: 2022-10-04 | Payer: BLUE CROSS/BLUE SHIELD | Attending: Internal Medicine | Admitting: Internal Medicine

## 2022-10-03 ENCOUNTER — Telehealth: Admit: 2022-10-03 | Payer: PRIVATE HEALTH INSURANCE | Attending: Adult Health | Primary: Internal Medicine

## 2022-10-03 ENCOUNTER — Emergency Department: Admit: 2022-10-03 | Payer: BLUE CROSS/BLUE SHIELD | Primary: Internal Medicine

## 2022-10-03 ENCOUNTER — Encounter: Admit: 2022-10-03 | Payer: PRIVATE HEALTH INSURANCE | Attending: Internal Medicine | Primary: Internal Medicine

## 2022-10-03 ENCOUNTER — Ambulatory Visit: Admit: 2022-10-03 | Payer: BLUE CROSS/BLUE SHIELD | Primary: Internal Medicine

## 2022-10-03 ENCOUNTER — Telehealth: Admit: 2022-10-03 | Payer: PRIVATE HEALTH INSURANCE | Attending: Hematology | Primary: Internal Medicine

## 2022-10-03 DIAGNOSIS — C3411 Malignant neoplasm of upper lobe, right bronchus or lung: Secondary | ICD-10-CM

## 2022-10-03 DIAGNOSIS — R1310 Dysphagia, unspecified: Secondary | ICD-10-CM

## 2022-10-03 DIAGNOSIS — Z510 Encounter for antineoplastic radiation therapy: Secondary | ICD-10-CM

## 2022-10-03 DIAGNOSIS — Z5111 Encounter for antineoplastic chemotherapy: Secondary | ICD-10-CM

## 2022-10-03 DIAGNOSIS — R410 Disorientation, unspecified: Secondary | ICD-10-CM

## 2022-10-03 DIAGNOSIS — J449 Chronic obstructive pulmonary disease, unspecified: Secondary | ICD-10-CM

## 2022-10-03 DIAGNOSIS — K529 Noninfective gastroenteritis and colitis, unspecified: Secondary | ICD-10-CM

## 2022-10-03 DIAGNOSIS — C3490 Malignant neoplasm of unspecified part of unspecified bronchus or lung: Secondary | ICD-10-CM

## 2022-10-03 DIAGNOSIS — Z720 Tobacco use: Secondary | ICD-10-CM

## 2022-10-03 DIAGNOSIS — B1920 Unspecified viral hepatitis C without hepatic coma: Secondary | ICD-10-CM

## 2022-10-03 LAB — CBC WITH AUTO DIFFERENTIAL
BKR WAM ABSOLUTE IMMATURE GRANULOCYTES.: 0.03 x 1000/ÂµL (ref 0.00–0.30)
BKR WAM ABSOLUTE LYMPHOCYTE COUNT.: 0.49 x 1000/ÂµL — ABNORMAL LOW (ref 0.60–3.70)
BKR WAM ABSOLUTE NRBC (2 DEC): 0 x 1000/ÂµL (ref 0.00–1.00)
BKR WAM ANALYZER ANC: 3.11 x 1000/ÂµL (ref 2.00–7.60)
BKR WAM BASOPHIL ABSOLUTE COUNT.: 0.03 x 1000/ÂµL (ref 0.00–1.00)
BKR WAM BASOPHILS: 0.7 % (ref 0.0–1.4)
BKR WAM EOSINOPHIL ABSOLUTE COUNT.: 0.09 x 1000/ÂµL (ref 0.00–1.00)
BKR WAM EOSINOPHILS: 2.2 % (ref 0.0–5.0)
BKR WAM HEMATOCRIT (2 DEC): 36.7 % (ref 35.00–45.00)
BKR WAM HEMOGLOBIN: 12.1 g/dL (ref 11.7–15.5)
BKR WAM IMMATURE GRANULOCYTES: 0.7 % (ref 0.0–1.0)
BKR WAM LYMPHOCYTES: 12 % — ABNORMAL LOW (ref 17.0–50.0)
BKR WAM MCH (PG): 30.7 pg (ref 27.0–33.0)
BKR WAM MCHC: 33 g/dL (ref 31.0–36.0)
BKR WAM MCV: 93.1 fL (ref 80.0–100.0)
BKR WAM MONOCYTE ABSOLUTE COUNT.: 0.34 x 1000/ÂµL (ref 0.00–1.00)
BKR WAM MONOCYTES: 8.3 % (ref 4.0–12.0)
BKR WAM MPV: 8.8 fL (ref 8.0–12.0)
BKR WAM NEUTROPHILS: 76.1 % — ABNORMAL HIGH (ref 39.0–72.0)
BKR WAM NUCLEATED RED BLOOD CELLS: 0 % (ref 0.0–1.0)
BKR WAM PLATELETS: 210 x1000/ÂµL (ref 150–420)
BKR WAM RDW-CV: 13.2 % (ref 11.0–15.0)
BKR WAM RED BLOOD CELL COUNT.: 3.94 M/ÂµL — ABNORMAL LOW (ref 4.00–6.00)
BKR WAM WHITE BLOOD CELL COUNT: 4.1 x1000/ÂµL (ref 4.0–11.0)

## 2022-10-03 LAB — HEPATIC FUNCTION PANEL
BKR A/G RATIO: 1.4 (ref 1.0–2.2)
BKR ALANINE AMINOTRANSFERASE (ALT): 16 U/L (ref 10–35)
BKR ALBUMIN: 3.8 g/dL (ref 3.6–5.1)
BKR ALKALINE PHOSPHATASE: 54 U/L (ref 9–122)
BKR ASPARTATE AMINOTRANSFERASE (AST): 31 U/L (ref 10–35)
BKR AST/ALT RATIO: 1.9
BKR BILIRUBIN DIRECT: <0.2 mg/dL (ref <=0.3)
BKR BILIRUBIN TOTAL: 0.3 mg/dL (ref <=1.2)
BKR GLOBULIN: 2.7 g/dL (ref 2.0–3.9)
BKR PROTEIN TOTAL: 6.5 g/dL (ref 5.9–8.3)

## 2022-10-03 LAB — URINALYSIS WITH CULTURE REFLEX      (BH LMW YH)
BKR BILIRUBIN, UA: NEGATIVE
BKR BLOOD, UA: NEGATIVE
BKR GLUCOSE, UA: NEGATIVE
BKR KETONES, UA: NEGATIVE
BKR NITRITE, UA: NEGATIVE
BKR PH, UA: 6 (ref 5.5–7.5)
BKR SPECIFIC GRAVITY, UA: 1.022 (ref 1.005–1.030)
BKR UROBILINOGEN, UA (MG/DL): <2 mg/dL (ref <=2.0)

## 2022-10-03 LAB — BASIC METABOLIC PANEL
BKR ANION GAP: 10 (ref 7–17)
BKR BLOOD UREA NITROGEN: 15 mg/dL (ref 8–23)
BKR BUN / CREAT RATIO: 33.3 — ABNORMAL HIGH (ref 8.0–23.0)
BKR CALCIUM: 9.2 mg/dL (ref 8.8–10.2)
BKR CHLORIDE: 103 mmol/L (ref 98–107)
BKR CO2: 28 mmol/L (ref 20–30)
BKR CREATININE: 0.45 mg/dL (ref 0.40–1.30)
BKR EGFR, CREATININE (CKD-EPI 2021): >60 mL/min/{1.73_m2} (ref >=60)
BKR GLUCOSE: 121 mg/dL — ABNORMAL HIGH (ref 70–100)
BKR POTASSIUM: 3.5 mmol/L (ref 3.3–5.3)
BKR SODIUM: 141 mmol/L (ref 136–144)

## 2022-10-03 LAB — URINE MICROSCOPIC     (BH GH LMW YH)
BKR RBC/HPF INSTRUMENT: 1 /HPF (ref 0–2)
BKR URINE SQUAMOUS EPITHELIAL CELLS, UA (NUMERIC): <1 /HPF (ref 0–5)
BKR WBC/HPF INSTRUMENT: 6 /HPF — ABNORMAL HIGH (ref 0–5)

## 2022-10-03 LAB — UA REFLEX CULTURE

## 2022-10-03 LAB — MAGNESIUM: BKR MAGNESIUM: 2.3 mg/dL (ref 1.7–2.4)

## 2022-10-03 MED ORDER — FAMOTIDINE 20 MG TABLET
20 mg | Freq: Two times a day (BID) | ORAL | Status: DC
Start: 2022-10-03 — End: 2022-10-04

## 2022-10-03 MED ORDER — LACTATED RINGERS IV BOLUS (NEW BAG)
Freq: Once | INTRAVENOUS | Status: CP
Start: 2022-10-03 — End: ?
  Administered 2022-10-03: 19:00:00 500.000 mL/h via INTRAVENOUS

## 2022-10-03 MED ORDER — CALCIUM CARBONATE 500 MG-VITAMIN D3 5 MCG (200 UNIT) TABLET
1250500-200 mg (500 mg calcium) - 200 unit | Freq: Every day | ORAL | Status: DC
Start: 2022-10-03 — End: 2022-10-04

## 2022-10-03 MED ORDER — ENOXAPARIN 40 MG/0.4 ML SUBCUTANEOUS SYRINGE
400.4 mg/0.4 mL | SUBCUTANEOUS | Status: DC
Start: 2022-10-03 — End: 2022-10-04

## 2022-10-03 NOTE — Telephone Encounter
Call received from pt daughter [name]- they're on their wait to ED

## 2022-10-03 NOTE — ED Notes
2:10 PM Pt transported to imaging.

## 2022-10-03 NOTE — ED Notes
Floor Handoff Telemetry: 	[]  Yes		[x]  NoCode Status:   [x]  Full		[]  DNR		[]  DNI		Other (specify):Safety Precautions: []  Fall Risk  []  Sitter   []  Restraints	[]  Suicidal	[x]  None	Other (specify):Mentation/Orientation:	 A&O (Self, person, place, time) x     2 to self and place. Disoriented to time. Sometimes forgetful. Unsure of certain times and dates   	 Disoriented to:                    	 Special Accommodations: []  Hearing impaired   []  Blind  []  Nonverbal  []  Cognitive impairmentOxygenation Upon Admission: [x]  RA	[]  NC	[]  Venti  []  Simple Mask []  Other	Baseline O2 Status? [x]  Yes	[]  NoAmbulation: [x]  Independent	[]  Cane   []  Walker	[]  Wheelchair	[]  Bedbound		[]  Hemiplegic	[]  Paraplegic	[]  QuadraplegicEliminiation: [x]  Independent	[]  Commode	[]  Bedpan/Urinal  []  Straight Cath []  Foley cath			[]  Urostomy	[]  Colostomy	Other (specify):Diarrhea/Loose stool : []  1x within 24h  []  2x within 24h  []  3x within 24h  [x]  None 	C.Diff Order: 	[]  Ordered- needs to be collected             []  Collected-sent to lab             []  Resulted - Negative C.Diff             []  Resulted - Positive C.Diff[]  Not Ordered   [x]  N/ASkin Alteration: []  Pressure Injury []  Wound []  None [x]  Skin not assessedDiet: [x]  Regular/No order placed	[]  NPO		Other (specify):IV Access: [x]  PIV   []  PICC    []  Port    [] Central line    []  A-line    Other (specify)IVF/GTT Running Upon ED Departure? [x]  No	    []  Yes (specify):Outstanding Meds/Treatments/Tests:Patient Belongings:Are the belongings documented?          [x]  No	    []  YesIs someone taking belongings home?   [x]  No     []  Yes  Who? (specify)                                   Hector Brunswick, RN

## 2022-10-03 NOTE — Utilization Review (ED)
UM Status: Commercial - IPPatient admitted for altered mental status. + Significant PMH including lung cancer on active treatment. Imaging and labs reviewed, + urinalysis- urine culture pending. IV fluids ordered.

## 2022-10-03 NOTE — Telephone Encounter
This RN spoke with patient's daughter Courtney Knox who expressed concerns about her mother's mentation. Per daughter, as of Monday/ Tuesday last week patient has shown sxs of confusion and memory loss which has gotten progressively worse since Wednesday last week. Daughter gave exmaples of patient reporting that the stove was backwards, the car and phones aren't working when they are in fact working and being unable to dial numbers. Per daughter, patient comes in and out of lucidity. She cannot properly name objects and has very minimal PO intake. Patient was also noted to be slurring words last week per daughter. Daughter was concerned that this patient is severely dehydrated and possibly has a UTI causing these acute sxs. This RN expressed concerns to daughter as well and informed her that the ED would be the best option, however, per patient's daughter, she refuses as she is fearful of the ED. Patient's daughter is agreeable to transporting her mother to the office or ED if this RN can get patient to agree w/ plan.> 10 minutes spent on call.This RN reached out to Courtney Knox, who was accompanied by her husband, Courtney Knox. Patient was able to verbalize her name DOB, husband and daguhter's names correctly. Patient did not appear actuely confused during time of call, however, was having difficulty in describing her skin concerns on her back. Patient reported that she is not eating or drinking well, she denied CP, SOB, FCNVDC. Patient reported that her urine has been bright yellow, which can be r/t dehydration status. This RN discussed concerns with patient, who was reluctant to go to ED. Patient verbalized being overwhelmed with so many appts, times changes and plans of care.This RN offered patient empathy in the situation and informed patient that APP will be contacted to determine if patient can at minimum be seen for hydration/labs/evaluation. Patient's husband was in the background, audible, telling patient that she needs to be seen. Patient became upset and argumentative with husband, Courtney Knox, and refused to let this RN speak with him personally.This RN ended call and reported off to APP Courtney Knox who will reach out to patient w/ further plan/ phone assessment. > 22 minutes spent on call. Electronically Signed by Courtney Mascot, RN, October 03, 2022

## 2022-10-03 NOTE — Telephone Encounter
Received call from [name] she stated Dajanique is very very confused, her words her not coming out correctly and if she is contacting nursing or if nursing is calling they would also need  to contact Amber. They have convinced Marlaysia to come into see Dr. Maxcine Ham but she unable to dial the correct numbers which Amber has to do for her. She has been cancelling various appointments because of her confusion Amber also stated if any appointment or instructions is relayed to Jniya it has to also be relayed to [name] to make sure it is correct. Amber is requesting Kema be seen by Dr. Maxcine Ham immediately before she receives anymore treatment

## 2022-10-03 NOTE — Telephone Encounter
Recvd call from [place].  Says she needs to speak w Tresa Endo.  In re; to apts. Says this will be in Re: to arranging apts.. says she spoke w kelly about this.  I see she has apt w a Dr. Ocie Bob. Im not quite sure what she asking.She is is really adamant. Wants a call back asap to discussAlso trying to contact nurse

## 2022-10-03 NOTE — ED Notes
7:13 PM Pt's daughter requesting to speak with the MD. RN called MD who is aware.

## 2022-10-03 NOTE — Telephone Encounter
Call received from pt's Husband returning APRN missed call [name] [phone]

## 2022-10-03 NOTE — ED Notes
12:18 PM Pt received from triage with c/o AMS. Pt's daughter at bedside reports that the pt had chemo 1 week ago on Monday and had radiation Monday and Tuesday. She reports that since starting the treatments, the pt has become increasingly forgetful and confused. He daughter states that she calls people at all hours, forgets words, is disoriented to time, and forgets how to do daily things like using the phone. Pt is argumentative with daughter about remembering things.Pt also had a skin tear and abrasion to the right hand which she reports she hits against things sometimes. Pt had wrapped it in tape. RN unwrapped the hand and rewrapped with bacitracin, gauze, and coban.Chief Complaint Patient presents with  Altered Mental Status Hx Lung CA. Team instructed patient to report tto ED. Confusion, poor appetite, not sleeping. Patient on Active Chemo, last tx x1 week ago. Radiation. Past Medical History: Diagnosis Date  Colitis   COPD (chronic obstructive pulmonary disease) (HC Code)   Tobacco abuse

## 2022-10-03 NOTE — Telephone Encounter
Called patients phone - no answer left message. Then called and spoke with patients daughter [name] and husband on separate calls. Explained that the ECC is unable to accommodate her, again recommended that she be evaluated in the ED and will likely be admitted from there. I'm concerned about her cognitive changes as well as the lack of PO intake for the past several days. Husband describes it alzheimer's like behavior that came on suddenly. Daughter was at her home and not with her Mom when I called. Her husband stated she had gone downstairs to get trash cans, wasn't sure where she was, refused to put her on the phone with me. He is frustrated that she is doing poorly, but states the ED is not an option. I continued to explain the process for evaluation and admission through the emergency room. Explained options of coming in by car vs ambulance. He is going to discuss further with patient and Amber and see what [name] wants to do.

## 2022-10-04 ENCOUNTER — Ambulatory Visit: Admit: 2022-10-04 | Payer: BLUE CROSS/BLUE SHIELD | Attending: Adult Health | Primary: Internal Medicine

## 2022-10-04 ENCOUNTER — Inpatient Hospital Stay: Admit: 2022-10-04 | Discharge: 2022-10-04 | Payer: BLUE CROSS/BLUE SHIELD | Primary: Internal Medicine

## 2022-10-04 ENCOUNTER — Ambulatory Visit: Admit: 2022-10-04 | Payer: BLUE CROSS/BLUE SHIELD | Primary: Internal Medicine

## 2022-10-04 DIAGNOSIS — E43 Unspecified severe protein-calorie malnutrition: Secondary | ICD-10-CM

## 2022-10-04 DIAGNOSIS — Z510 Encounter for antineoplastic radiation therapy: Secondary | ICD-10-CM

## 2022-10-04 DIAGNOSIS — G4700 Insomnia, unspecified: Secondary | ICD-10-CM

## 2022-10-04 DIAGNOSIS — R4182 Altered mental status, unspecified: Secondary | ICD-10-CM

## 2022-10-04 DIAGNOSIS — Z880 Allergy status to penicillin: Secondary | ICD-10-CM

## 2022-10-04 DIAGNOSIS — B1920 Unspecified viral hepatitis C without hepatic coma: Secondary | ICD-10-CM

## 2022-10-04 DIAGNOSIS — C3490 Malignant neoplasm of unspecified part of unspecified bronchus or lung: Secondary | ICD-10-CM

## 2022-10-04 DIAGNOSIS — C7951 Secondary malignant neoplasm of bone: Secondary | ICD-10-CM

## 2022-10-04 DIAGNOSIS — F17210 Nicotine dependence, cigarettes, uncomplicated: Secondary | ICD-10-CM

## 2022-10-04 DIAGNOSIS — K208 Other esophagitis without bleeding: Secondary | ICD-10-CM

## 2022-10-04 DIAGNOSIS — Z5111 Encounter for antineoplastic chemotherapy: Secondary | ICD-10-CM

## 2022-10-04 DIAGNOSIS — J449 Chronic obstructive pulmonary disease, unspecified: Secondary | ICD-10-CM

## 2022-10-04 DIAGNOSIS — Z79899 Other long term (current) drug therapy: Secondary | ICD-10-CM

## 2022-10-04 DIAGNOSIS — T451X5A Adverse effect of antineoplastic and immunosuppressive drugs, initial encounter: Secondary | ICD-10-CM

## 2022-10-04 DIAGNOSIS — C3411 Malignant neoplasm of upper lobe, right bronchus or lung: Secondary | ICD-10-CM

## 2022-10-04 DIAGNOSIS — K1231 Oral mucositis (ulcerative) due to antineoplastic therapy: Secondary | ICD-10-CM

## 2022-10-04 DIAGNOSIS — Z681 Body mass index (BMI) 19 or less, adult: Secondary | ICD-10-CM

## 2022-10-04 DIAGNOSIS — Z1152 Encounter for screening for COVID-19: Secondary | ICD-10-CM

## 2022-10-04 LAB — BASIC METABOLIC PANEL
BKR ANION GAP: 8 (ref 7–17)
BKR BLOOD UREA NITROGEN: 8 mg/dL (ref 8–23)
BKR BUN / CREAT RATIO: 22.2 (ref 8.0–23.0)
BKR CALCIUM: 8.6 mg/dL — ABNORMAL LOW (ref 8.8–10.2)
BKR CHLORIDE: 104 mmol/L (ref 98–107)
BKR CO2: 26 mmol/L (ref 20–30)
BKR CREATININE: 0.36 mg/dL — ABNORMAL LOW (ref 0.40–1.30)
BKR EGFR, CREATININE (CKD-EPI 2021): >60 mL/min/{1.73_m2} (ref >=60)
BKR GLUCOSE: 91 mg/dL (ref 70–100)
BKR POTASSIUM: 3.7 mmol/L (ref 3.3–5.3)
BKR SODIUM: 138 mmol/L (ref 136–144)

## 2022-10-04 LAB — RESPIRATORY VIRUS PCR PANEL     (BH GH LMW Q YH)
BKR ADENOVIRUS: NOT DETECTED
BKR BORDETELLA PARAPERTUSSIS: NOT DETECTED
BKR BORDETELLA PERTUSSIS: NOT DETECTED
BKR CHLAMYDIA PNEUMONIAE: NOT DETECTED
BKR CORONAVIRUS 229E: NOT DETECTED
BKR CORONAVIRUS HKU1: NOT DETECTED
BKR CORONAVIRUS NL63: NOT DETECTED
BKR CORONAVIRUS OC43: NOT DETECTED
BKR HUMAN METAPNEUMOVIRUS (HMPV): NOT DETECTED
BKR INFLUENZA A: NOT DETECTED
BKR INFLUENZA B: NOT DETECTED
BKR MYCOPLASMA PNEUMONIAE: NOT DETECTED
BKR PARAINFLUENZA VIRUS 1: NOT DETECTED
BKR PARAINFLUENZA VIRUS 2: NOT DETECTED
BKR PARAINFLUENZA VIRUS 3: NOT DETECTED
BKR PARAINFLUENZA VIRUS 4: NOT DETECTED
BKR RESPIRATORY SYNCYTIAL VIRUS: NOT DETECTED
BKR RHINOVIRUS/ENTEROVIRUS: NOT DETECTED
BKR SARS-COV-2 RNA (COVID-19) (YH): NOT DETECTED

## 2022-10-04 LAB — CBC WITH AUTO DIFFERENTIAL
BKR WAM ABSOLUTE IMMATURE GRANULOCYTES.: 0.02 x 1000/ÂµL (ref 0.00–0.30)
BKR WAM ABSOLUTE LYMPHOCYTE COUNT.: 0.44 x 1000/ÂµL — ABNORMAL LOW (ref 0.60–3.70)
BKR WAM ABSOLUTE NRBC (2 DEC): 0 x 1000/ÂµL (ref 0.00–1.00)
BKR WAM ANALYZER ANC: 2.4 x 1000/ÂµL (ref 2.00–7.60)
BKR WAM BASOPHIL ABSOLUTE COUNT.: 0.04 x 1000/ÂµL (ref 0.00–1.00)
BKR WAM BASOPHILS: 1.2 % (ref 0.0–1.4)
BKR WAM EOSINOPHIL ABSOLUTE COUNT.: 0.13 x 1000/ÂµL (ref 0.00–1.00)
BKR WAM EOSINOPHILS: 3.8 % (ref 0.0–5.0)
BKR WAM HEMATOCRIT (2 DEC): 38.5 % (ref 35.00–45.00)
BKR WAM HEMOGLOBIN: 12.6 g/dL (ref 11.7–15.5)
BKR WAM IMMATURE GRANULOCYTES: 0.6 % (ref 0.0–1.0)
BKR WAM LYMPHOCYTES: 13 % — ABNORMAL LOW (ref 17.0–50.0)
BKR WAM MCH (PG): 30.7 pg (ref 27.0–33.0)
BKR WAM MCHC: 32.7 g/dL (ref 31.0–36.0)
BKR WAM MCV: 93.9 fL (ref 80.0–100.0)
BKR WAM MONOCYTE ABSOLUTE COUNT.: 0.35 x 1000/ÂµL (ref 0.00–1.00)
BKR WAM MONOCYTES: 10.4 % (ref 4.0–12.0)
BKR WAM MPV: 8.6 fL (ref 8.0–12.0)
BKR WAM NEUTROPHILS: 71 % (ref 39.0–72.0)
BKR WAM NUCLEATED RED BLOOD CELLS: 0 % (ref 0.0–1.0)
BKR WAM PLATELETS: 187 x1000/ÂµL (ref 150–420)
BKR WAM RDW-CV: 13.4 % (ref 11.0–15.0)
BKR WAM RED BLOOD CELL COUNT.: 4.1 M/ÂµL (ref 4.00–6.00)
BKR WAM WHITE BLOOD CELL COUNT: 3.4 x1000/ÂµL — ABNORMAL LOW (ref 4.0–11.0)

## 2022-10-04 LAB — VITAMIN B12: BKR VITAMIN B12: 545 pg/mL (ref 232–1245)

## 2022-10-04 LAB — TREPONEMA PALLIDUM (SYPHILIS) ANTIBODY W/REFLEX
BKR TREPONEMA PALLIDUM ANTIBODY INITIAL RESULT: <0.1 {index}
BKR TREPONEMA PALLIDUM ANTIBODY TOTAL, SERUM: NONREACTIVE

## 2022-10-04 LAB — TSH: BKR THYROID STIMULATING HORMONE: 0.473 u[IU]/mL

## 2022-10-04 LAB — URINE CULTURE

## 2022-10-04 LAB — SURGICAL PATHOLOGY

## 2022-10-04 MED ORDER — HYDROXYZINE HCL 25 MG TABLET
25 mg | ORAL | Status: DC | PRN
Start: 2022-10-04 — End: 2022-10-04

## 2022-10-04 MED ORDER — HYDROXYZINE HCL 25 MG TABLET
25 | ORAL_TABLET | ORAL | 1 refills | Status: AC | PRN
Start: 2022-10-04 — End: ?

## 2022-10-04 MED ORDER — SERTRALINE 25 MG TABLET
25 | ORAL_TABLET | Freq: Every day | ORAL | 3 refills | Status: AC
Start: 2022-10-04 — End: ?

## 2022-10-04 MED ORDER — SODIUM CHLORIDE 0.9 % INTRAVENOUS SOLUTION
INTRAVENOUS | Status: DC
Start: 2022-10-04 — End: 2022-10-04
  Administered 2022-10-04 (×2): via INTRAVENOUS

## 2022-10-04 MED ORDER — ENOXAPARIN 30 MG/0.3 ML SUBCUTANEOUS SYRINGE
300.3 mg/0.3 mL | SUBCUTANEOUS | Status: DC
Start: 2022-10-04 — End: 2022-10-04

## 2022-10-04 MED ORDER — SERTRALINE 25 MG TABLET
25 mg | Freq: Every day | ORAL | Status: DC
Start: 2022-10-04 — End: 2022-10-04

## 2022-10-04 MED ORDER — FAMOTIDINE 20 MG TABLET
20 | ORAL_TABLET | Freq: Every day | ORAL | 1 refills | Status: AC
Start: 2022-10-04 — End: ?

## 2022-10-04 MED ORDER — FAMOTIDINE 20 MG TABLET
20 mg | Freq: Every day | ORAL | Status: DC
Start: 2022-10-04 — End: 2022-10-04

## 2022-10-04 NOTE — ED Notes
[phone]- Pt brought from A side. Pt AOx2, VSS on RA, denies CP/SOB. Pt confused requiring frequent redirection. Pt OOB 1A w/ a steady gait, voiding spontaneously, no BM. New L FA 18g inserted, NS infusing @ 150/hr. Pt in NAD resting comfortably in a stretcher. No acute changes, call bell and personal items within reach. All safety measures maintained. 0510: pt transferred to Wp9.

## 2022-10-04 NOTE — Plan of Care
Plan of Care Overview/ Patient Status    Admission Note NursingDebra Knox is a 67 y.o. female admitted with a chief complaint of confusion. Patient arrived from emergency department.Patient is disoriented to time and situation. Vital signs stable. On room air. No complaints of pain and no shortness of breath, nausea or vomiting observed.  Independently ambulates. Isolated rashes on chest and back. Spontaneously voiding yellow urine. No bowel movement. Safety precautions maintained. See flow sheet for further details. Vitals:  10/03/22 1150 10/03/22 1323 10/03/22 1752 10/04/22 0524 BP: 131/71 128/74 126/73 139/74 Pulse: 75 78 63 60 Resp: 16 17 16 15  Temp: 98.6 ?F (37 ?C) 98.6 ?F (37 ?C) 98.1 ?F (36.7 ?C) 98.2 ?F (36.8 ?C) TempSrc: Oral Oral Oral Oral SpO2:  100% 97% 99% Weight:    39.3 kg Height:    5' 1.5 (1.562 m) Oxygen therapy Oxygen TherapySpO2: 99 %Device (Oxygen Therapy): room airI have reviewed the patient's current medication orders..I have reviewed patient valuables Belongings charted in last 7 days: Patient Valuables   Patient Valuables Flowsheet                    PATIENT VALUABLE(S)       Clothing Disposition At bedside/locker/closet 10/04/22 0750     See flowsheets, patient education and plan of care for additional information.

## 2022-10-04 NOTE — Other
[facility]SULT  REQUEST  DOC[facility] NOTE-Type of consult: [facility] Oncology -New Consult: [id number] Courtney Knox / Location: [id number] / Reason for Consult? 67 y.o. 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 Received response at 08:53.-Arayla Kruschke Justice De[place], PCT7/9/20248:[address] [facility] [phone]

## 2022-10-04 NOTE — Plan of Care
Plan of Care Overview/ Patient Status    0700-1200: Pt A&Ox2, disoriented to time and situation; refused am vitals. Pt ambulating in room independently. Pt agitated, refusing am meds, team aware. Discharge instructions given, piv removed. Safety precautions maintained.Damali Broadfoot was discharged via Private Car accompanied by Spouse.  Verbalized understanding of discharge instructionsand recommended follow up care as per the after visit summary.  Written discharge instructions provided. Denies any further questions. Vital signs    Vitals:  10/03/22 1150 10/03/22 1323 10/03/22 1752 10/04/22 0524 BP: 131/71 128/74 126/73 139/74 Pulse: 75 78 63 60 Resp: 16 17 16 15  Temp: 98.6 ?F (37 ?C) 98.6 ?F (37 ?C) 98.1 ?F (36.7 ?C) 98.2 ?F (36.8 ?C) TempSrc: Oral Oral Oral Oral SpO2:  100% 97% 99% Weight:    39.3 kg Height:    5' 1.5 (1.562 m) Patient confirmed all belongings returned. Belongings charted in last 7 days: Patient Valuables   Patient Valuables Flowsheet                    PATIENT VALUABLE(S)       Clothing Disposition At bedside/locker/closet 10/04/22 [phone]

## 2022-10-04 NOTE — ED Notes
7:24 PM Pt resting in stretcher, in NAD at this time. VSS. Pending IP bed. 7:56 PM Pt expressing frustration with not knowing why she is being admitted, MD made aware that pt and daughter would like an update on POC. 8:51 PM Pt becoming extremely frustrated jumping out of bed, redirected by this RN and daughter.

## 2022-10-04 NOTE — H&P
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 Has also had recent weight loss. Denies fever/ chills/ night sweats/ sick contacts, photophobia, dyspnea, nausea/ vomiting/ diarrhea/ bpr, dysuria/ hematuria/ frequency, and all other ROS negative.ED course: 98.6, 75, 100%, 16, 131/71, given LR x , admitted for altered mental status.Medical History: PMH PSH Past Medical History: Diagnosis Date  Colitis   COPD (chronic obstructive pulmonary disease) (HC Code)   HCV (hepatitis C virus)   Lung cancer (HC Code)   Tobacco abuse   No past surgical history on file. Social History Family History Social History Socioeconomic History  Marital status: Married   Spouse name: Not on file  Number of children: Not on file  Years of education: Not on file  Highest education level: Not on file Occupational History  Occupation: Past Cabin [employer], [employer] Tobacco Use  Smoking status: Every Day   Current packs/day: 1.00   Average packs/day: 1 pack/day for 51.5 years (51.5 ttl pk-yrs)   Types: Cigarettes   Start date: 67  Smokeless tobacco: Never  Tobacco comments:   1PPD x 40 years Vaping Use  Vaping Use: Never used Substance and Sexual Activity  Alcohol use: Not Currently  Drug use: Never Sexual activity: Not on file Other Topics Concern  Military Service Yes   Comment: [employer] Social History Narrative  Lives in [place]. Social Determinants of Health Financial Resource Strain: Not on file Food Insecurity: Not on file Transportation Needs: Not on file Physical Activity: Not on file Stress: Not on file Social Connections: Not on file Intimate Partner Violence: Not on file Housing Stability: Not on file  Family History Problem Relation Age of Onset  Lung cancer Father   Lymphoma Brother   Cancer Maternal Aunt   Prior to Admission Medications No current facility-administered medications on file prior to encounter. Current Outpatient Medications on File Prior to Encounter Medication Sig Dispense Refill  b complex vitamins (B COMPLEX) tablet Take 1 tablet by mouth daily.    calcium carbonate/vitamin D3 (CALCIUM WITH VITAMIN D3 ORAL) Take by mouth daily.    EPCLUSA 400-100 mg per tablet Take 1 tablet by mouth daily.    buPROPion XL (WELLBUTRIN XL) 150 mg 24 hr tablet Take 1 tablet (150 mg total) by mouth every morning. (Patient not taking: Reported on 08/04/2022) 90 tablet 1  prochlorperazine (COMPAZINE) 10 mg tablet Take 1 tablet (10 mg total) by mouth every 6 (six) hours as needed (for nausea or vomiting). 60 tablet 1   Home medication list obtained from: the patient's daughterAllergies Allergies Allergen Reactions  Penicillin Unknown  Review of Systems: Review of Systems: as per HPIObjective: Physical Exam: Vitals: BP 126/73  - Pulse 63  - Temp 98.1 ?F (36.7 ?C) (Oral)  - Resp 16  - SpO2 97% Constitutional: pleasant elderly woman, tangential, irritableEyes: anicteric scleraENMT: mm dry, op clearCardiovascular: rrr, no m/r/g, no JVDRespiratory: cta b ant/ postGI: normal bowel sounds, abdomen soft, nt, ndGU: no suprapubic tendernessHeme/ Lymph: wwp, lower extremities symmetric,  no edemaNeurologic: mental status as above, non-focalPsychiatric: irritable, tangentialSkin: light macular erythematous rash upper chestPertinent Labs/Diagnostics: Labs: Wbc 4.1Na 141Bicarbonate 28BUN 15Creatinine 0.45LFTs nmlCa 9.2UA: 1rbc, 6wbcDiagnostics:[place] Head wo IV Contrast Final Result    No acute intracranial abnormality. No masses identified. If there is further concern, consider MRI.  Please note that Noncontrast Head [place] is not sensitive for the detection of ischemic infarct. If ischemic infarct is of clinical concern, additional clinical or imaging evaluation is recommended.  [place] [facility] Notify System Classification: Routine.  Reported and signed by: Bradly Bienenstock, MD    [facility] Radiology and Biomedical Imaging   CXR Final Result   No acute cardiopulmonary findings are made. Chronic findings as discussed above.  [place] [facility] Notify System Classification: Routine.  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 Problems include 1) altered mental status with ddx including metastatic disease to brain, side effect of taxol or carboplatin, viral URI given cough, 2) odynophagia/ anorexia/ weight loss which possibly attributable to taxol-induced mucositis/ esophagitis.Plan: 1. Altered mental status:- obtain brain MRI- send tsh, b12, vdrl, RVP/ SARSCoV2 given cough- oncology consult in am re: ? Side effect of taxol or carboplatin2. Esophagitis:- start famotidine 20mg  PO q12h- NS at 193mL/ hr x 1L- magic mouthwash is non-formulary so unable to order- consider GI consult for further investigation with EGD3. HCV:- continue epclusa4. Medication reconciliation status: completeFEN: regular dietPPx: lovenoxCODE: FULL per my discussion with the patientAdvance Care Planning Patient has a living will: noPatient has healthcare representative/proxy: yes, daughter or husband Notifications: PCP: Merrilee Jansky    Primary Care Provider was notified of this admission. YesFamily was notified of this admission. YesPlan discussed with patient and/or family. YesSigned:Cynda Soule L Beonka Amesquita MD7/8/202411:45 PM

## 2022-10-04 NOTE — Discharge Instructions
Hospital Discharge InstructionsThe hospital and staff members extend their best wishes to you as you are discharged. Because we are most concerned with your health, we suggest you carefully read the following instructions:-Take your medication exactly as directed. Do not skip doses. -If you are prescribed antibiotics, take them as directed until they are gone, even if you start to feel better. -Please keep a copy of your current medications with you at ALL times.  Bring this with you to all MD appointments and to the Hospital/Emergency Room.  -Please bring your old and excess medications to your pharmacy for safe disposal.  NEVER throw medications in the trash or flush them down the drain. Call your doctor or return to the ED if you develop fever, chills, chest pain, shortness of breath or if you have any concerns. If you have questions about your hospitalization, please call your primary care physician or the [facility] Hospitalist service at: [phone]

## 2022-10-04 NOTE — Discharge Summary
[facility] Discharge SummaryPatient Data:  Patient Name: Courtney Knox Admit date: 10/03/2022 Age: 67 y.o. Discharge date: 10/04/22 DOB: [birth year]	 Discharge Attending Physician: Othelia Pulling  MRN: [id number]	 Discharged Condition: stable PCP: Merrilee Jansky, APRN  Disposition: Home  Principal Diagnosis: confusion Comorbidities Comorbidities present on admission:Patient has severe malnutrition based on BMI (Calculated): 16.1 and age.   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 and started on carboplatin/ taxol/ weekly RT on 6/11, recent esophagitis, in past week has had confusion/ poor memory/ slurred speech/ more argumentative/ insomnia/ anorexia/ odynpophagia/ abdominal discomfort/ rash torso and back/ occasional productive cough/ intermittent headache  Post Discharge Follow Up Items: Issues to be Addressed Post Discharge:F/u PCP, GI, OncologyMedication changes on discharge (Full medication list at conclusion of this summary) :Current Discharge Medication List  Discontinued   buPROPion XL (WELLBUTRIN XL) 150 mg 24 hr tablet    New  Details famotidine (PEPCID) 20 mg tablet Take 1 tablet (20 mg total) by mouth daily.Start date: 10/05/2022  hydrOXYzine (ATARAX) 25 mg tablet Take 1 tablet (25 mg total) by mouth every 4 (four) hours as needed for anxiety.Start date: 10/04/2022  sertraline (ZOLOFT) 25 mg tablet Take 1 tablet (25 mg total) by mouth daily.Start date: 10/04/2022   Pending Labs and Tests: Pending Lab Results   Order Current Status  Urine culture In process  Follow-up Information:Williams, Maryjean Morn, APRN4A [facility] [place] [phone] Leverne Humbles [place], [place] [address] [phone] up Future Appointments Date Time Provider Department Center 10/04/2022  3:45 PM [facility] SMIL RAD ONC YM CAD 10/05/2022  3:45 PM [facility] SMIL RAD ONC YM CAD 10/06/2022  3:45 PM [facility] SMIL RAD ONC YM CAD 10/07/2022  3:45 PM [facility] SMIL RAD ONC YM CAD 10/07/2022  4:15 PM Jamal Maes, MD SMIL RAD ONC Y[facility] CAD 10/10/2022  3:45 PM [facility] SMIL RAD ONC YM CAD 10/11/2022  9:00 AM [facility] [place] BLOOD LAB 2 DS NOR HAV [facility]/SRC LAB 10/11/2022 10:00 AM Gettinger, Myer Peer, MD SM THOR NOHA [facility] [place] M 10/11/2022 11:00 AM [facility] [place] MASTER CHAIR 2 SMIL NO HAV [facility] [place] M 10/11/2022  3:45 PM [facility] SMIL RAD ONC YM CAD 10/12/2022  3:45 PM [facility] SMIL RAD ONC YM CAD 10/13/2022  3:45 PM [facility] SMIL RAD ONC YM CAD 10/14/2022  3:45 PM [facility] SMIL RAD ONC YM CAD 10/14/2022  4:15 PM Jamal Maes, MD SMIL RAD ONC YM CAD 10/17/2022  3:45 PM [facility] SMIL RAD ONC YM CAD 10/18/2022  9:30 AM [facility] [place] BLOOD LAB 2 DS NOR HAV Y[facility]/SRC LAB 10/18/2022 10:15 AM Gettinger, Myer Peer, MD SM THOR NOHA [facility] [place] M 10/18/2022 11:00 AM [facility] [place] MASTER CHAIR 2 SMIL NO HAV [facility] [place] M 10/18/2022  3:45 PM [facility]YSC SMIL RAD ONC YM CAD 10/19/2022  3:45 PM [facility] SMIL RAD ONC YM CAD 10/20/2022  3:45 PM [facility] SMIL RAD ONC YM CAD 10/21/2022  3:45 PM [facility] SMIL RAD ONC YM CAD 10/21/2022  4:15 PM Jamal Maes, MD SMIL RAD ONC YM CAD 10/24/2022  2:45 PM [facility]_YSC SMIL RAD ONC YM CAD 10/25/2022  2:45 PM [facility] TRUEBE[facility]C_YSC SMIL RAD ONC YM CAD 11/25/2022  3:30 PM Amado Coe, MD NEUROSRG [facility] YM CAD 12/28/2022  2:00 PM Merrilee Jansky,  APRN INT MED [facility] [place] 01/02/2023  2:00 PM Lillia Abed, MD DIG HLTH [facility] BCD - DD Hospital Course: 67 y.o. 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 Problems include 1) altered mental status with ddx including metastatic disease to brain, side effect of taxol or carboplatin, viral URI given cough, 2) odynophagia/ anorexia/ weight loss which possibly attributable to taxol-induced mucositis/ esophagitis.Swallowing had returned to baseline, has always been difficult as per family, added zoloft and hydroxyzine for anxiety and OCD, will follow up with PCP, Oncology and GI, [place] head negative, has MRI 2 months ago was fine no need to repeat will follow up with care as outpatient, anxiety has played a large role in not eating not sleeping and weight loss as outpatientInpatient Consultants and summary of recommendations:Pertinent Procedures or Surgeries: Data: Pertinent lab findings:Recent Labs Lab 07/08/241343 07/09/240643 WBC 4.1 3.4* HGB 12.1 12.6 HCT 36.70 38.50 PLT 210 187  Recent Labs Lab 07/08/241343 07/09/240643 NEUTROPHILS 76.1* 71.0  Recent Labs Lab 07/08/241343 07/09/240643 NA 141 138 K 3.5 3.7 CL 103 104 CO2 28 26 BUN 15 8 CREATININE 0.45 0.36* GLU 121* 91 ANIONGAP 10 8  Recent Labs Lab 07/08/241343 07/09/240643 CALCIUM 9.2 8.6* MG 2.3  --   Recent Labs Lab 07/08/241343 ALT 16 AST 31 ALKPHOS 54 BILITOT 0.3 BILIDIR <0.2  No results for input(s): PTT, LABPROT, INR in the last 168 hours. Microbiology:No results for input(s): LABBLOO, LABURIN, LOWERRESPIRA in the last 168 hours.Imaging: Imaging results last 1 week:  [place] Head wo IV ContrastResult Date: 10/03/2022  No acute intracranial abnormality. No masses identified. If there is further concern, consider MRI. Please note that Noncontrast Head [place] is not sensitive for the detection of ischemic infarct. If ischemic infarct is of clinical concern, additional clinical or imaging evaluation is recommended. [place] [facility] Notify System Classification: Routine. Reported and signed by: Bradly Bienenstock, MD  [facility] Radiology and Biomedical Imaging CXRResult Date: 10/03/2022 No acute cardiopulmonary findings are made. Chronic findings as discussed above. [place] Rad[facility] Notify System Classification: Routine. Report initiated by:  Jeanette Caprice, RRA Reported and signed by: Bradly Bienenstock, MD  [facility] Radiology and Biomedical Imaging  Diet:  Diet RegularMobility: Highest Level of mobility - ACTUAL: Mobility Level 7, Walk 25+ feet, AM PAC 22-23  Physical Exam Discharge vital signs: Vitals:  10/04/22 0524 BP: 139/74 Pulse: 60 Resp: 15 Temp: 98.2 ?F (36.8 ?C) Cognitive Status at Discharge: BaselinePhysical ExamGeneral: well-appearing, lying comfortably, no acute distressHEENT: EOMI, no scleral icterus, mucous membranes moistNeck: trachea midline, neck suppleCV: regular rate and rhythm, normal S1, S2, no murmurs, rubs, or gallopsPulm: lungs clear to auscultation bilaterally, without wheezes, rales, or ronchi, chest expansion normal bilaterallyGI/GU: soft, nontender, nondistended, normoactive bowel sounds presentMusculoskeletal: no clubbing or cyanosis. No muscle tenderness. No edema.Skin: warm and dry, no rashes or ulcerations.Neuro: alert and oriented x 3, moves all extremities equallyPsych: thought linear, judgment intact, affect anxiousHistory  Allergies Allergies Allergen Reactions  Penicillin Unknown  PMH PSH Past Medical History: Diagnosis Date  Colitis   COPD (chronic obstructive pulmonary disease) (HC Code)   HCV (hepatitis C virus)   Lung cancer (HC Code)   Tobacco abuse   No  past surgical history on file. Social History Family History Social History Tobacco Use  Smoking status: Every Day   Current packs/day: 1.00   Average packs/day: 1 pack/day for 51.5 years (51.5 ttl pk-yrs)   Types: Cigarettes   Start date: 1973  Smokeless tobacco: Never  Tobacco comments:   1PPD x 40 years Substance Use Topics  Alcohol use: Not Currently  Family History Problem Relation Age of Onset  Lung cancer Father   Lymphoma Brother   Cancer Maternal Aunt     Discharge Medications  Discharge: Current Discharge Medication List  START taking these medications  Details famotidine (PEPCID) 20 mg tablet Take 1 tablet (20 mg total) by mouth daily.Qty: 30 tablet, Refills: 0Start date: 10/05/2022  hydrOXYzine (ATARAX) 25 mg tablet Take 1 tablet (25 mg total) by mouth every 4 (four) hours as needed for anxiety.Qty: 60 tablet, Refills: 0Start date: 10/04/2022  sertraline (ZOLOFT) 25 mg tablet Take 1 tablet (25 mg total) by mouth daily.Qty: 30 tablet, Refills: 2Start date: 10/04/2022   CONTINUE these medications which have NOT CHANGED  Details b complex vitamins (B COMPLEX) tablet Take 1 tablet by mouth daily.  calcium carbonate/vitamin D3 (CALCIUM WITH VITAMIN D3 ORAL) Take 1 tablet by mouth daily.  EPCLUSA 400-100 mg per tablet Take 1 tablet by mouth daily.  prochlorperazine (COMPAZINE) 10 mg tablet Take 1 tablet (10 mg total) by mouth every 6 (six) hours as needed (for nausea or vomiting).Qty: 60 tablet, Refills: 1  Associated Diagnoses: Malignant neoplasm of bronchus and lung (HC Code)   STOP taking these medications   buPROPion XL (WELLBUTRIN XL) 150 mg 24 hr tablet      Time: 45 minutes spent on the discharge of this patientElectronically Signed:Tip Atienza, DO 10/04/2022 11:49 AMBest Contact Information: M[facility]

## 2022-10-04 NOTE — Plan of Care
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 Problems include 1) altered mental status with ddx including metastatic disease to brain, side effect of taxol or carboplatin, viral URI given cough, 2) odynophagia/ anorexia/ weight loss which possibly attributable to taxol-induced mucositis/ esophagitis.CM has contacted pt.'s spouse to introduce CM/CM role as well as discuss pt.'s prior level of function/complete assessment. Pt.'s spouse confirmed pt. independent at baseline, pt. not active w/ [facility]HC not using any DME prior. Pt.'s spouse confirm he will transport pt. home today.Pt. medically cleared for discharge by provider. Pt. discharging home independently. Family transport anticipated at discharge. Case Management Screening and Evaluation  Flowsheet Row Most Recent Value Case Management Screening: Chart review completed. If YES to any question below then proceed to CM Eval/Plan  Is there a change in their cognitive function No Do you anticipate that the pt will have any discharge needs requiring CM intervention? No Has there been a readmission within the last 30 days and/or four (4) encounters (encounters include: ED, OBS, Inpatient) within the last six (6) months? No Were there services prior to admission ( Examples: Assisted Living, HD, Homecare, Extended Care Facility, Methadone, SNF, Outpatient Infusion Center) No Negative/Positive Screen Negative Screening: Case Management department will follow patient's progress and discuss plan of care with treatment team. Case Manager Attestation  I have reviewed the medical record and completed the above screen. CM staff will follow patient's progress and discuss the plan of care with the Treatment Team. Yes  Case Management Plan  Flowsheet Row Most Recent Value Discharge Planning  Patient/Patient Representative was presented with a list of facilities, agencies and/or dme providers and Referral(s) placed for: None Mode of Transportation  Private car  (add comment for special considerations) Patient accompanied by Family to transport CM D/C Readiness  PASRR completed and approved N/A Authorization number obtained, if required N/A Is there a 3 day INPATIENT Qualifying stay for Medicare Patients? N/A Medicare IM- signed, dated, timed and scanned, if required N/A DME Authorized/Delivered N/A No needs identified/ follow up with PCP/MD Yes Post acute care services secured W10 complete N/A Pri Completed and Accepted  N/A Is the destination address correct on the W10 N/A Finalized Plan  Expected Discharge Date 10/04/22 Discharge Disposition Home or Self Care  Norville Haggard, LCSWCovering Social Work Case ManagerCare Management Department

## 2022-10-05 ENCOUNTER — Other Ambulatory Visit: Payer: Self-pay

## 2022-10-05 ENCOUNTER — Encounter: Payer: Self-pay | Admitting: Nurse Practitioner

## 2022-10-05 ENCOUNTER — Encounter: Payer: Self-pay | Admitting: *Deleted

## 2022-10-05 ENCOUNTER — Inpatient Hospital Stay: Payer: Medicare Other | Attending: Oncology | Admitting: Nurse Practitioner

## 2022-10-05 ENCOUNTER — Telehealth: Admit: 2022-10-05 | Payer: PRIVATE HEALTH INSURANCE | Attending: Radiation Oncology | Primary: Internal Medicine

## 2022-10-05 ENCOUNTER — Encounter: Admit: 2022-10-05 | Payer: PRIVATE HEALTH INSURANCE | Primary: Internal Medicine

## 2022-10-05 ENCOUNTER — Ambulatory Visit: Admit: 2022-10-05 | Payer: BLUE CROSS/BLUE SHIELD | Primary: Internal Medicine

## 2022-10-05 VITALS — BP 106/65 | HR 86 | Temp 98.2°F | Resp 16 | Ht 67.0 in | Wt 157.8 lb

## 2022-10-05 DIAGNOSIS — Z510 Encounter for antineoplastic radiation therapy: Secondary | ICD-10-CM

## 2022-10-05 DIAGNOSIS — C3411 Malignant neoplasm of upper lobe, right bronchus or lung: Secondary | ICD-10-CM

## 2022-10-05 DIAGNOSIS — C787 Secondary malignant neoplasm of liver and intrahepatic bile duct: Secondary | ICD-10-CM | POA: Diagnosis not present

## 2022-10-05 DIAGNOSIS — C210 Malignant neoplasm of anus, unspecified: Secondary | ICD-10-CM | POA: Insufficient documentation

## 2022-10-05 DIAGNOSIS — Z5111 Encounter for antineoplastic chemotherapy: Secondary | ICD-10-CM

## 2022-10-05 NOTE — Progress Notes (Signed)
PATIENT NAVIGATOR PROGRESS NOTE  Name: KENNETHA PEARMAN Date: 10/05/2022 MRN: [id number]  DOB: 20-Aug-1955   Reason for visit:  F/U visit and faxed referral  Comments:  Faxed referral to [facility]'s Dr Modesta Messing at [phone] for urgent consult. Per [facility] scheduler visit will be next week most likely    Time spent counseling/coordinating care: 30-45 minutes

## 2022-10-05 NOTE — Progress Notes (Signed)
The proposed treatment discussed in conference is for discussion purpose only and is not a binding recommendation.  The patients have not been physically examined, or presented with their treatment options.  Therefore, final treatment plans cannot be decided.  

## 2022-10-05 NOTE — Progress Notes (Signed)
[facility] OFFICE PROGRESS NOTE   Diagnosis: Anal cancer  INTERVAL HISTORY:   Alyssa Deleon returns as scheduled.  She continues to have periodic diarrhea.  She takes Imodium and develops constipation.  Appetite overall is poor.  Objective:  Vital signs in last 24 hours:  Blood pressure 106/65, pulse 86, temperature 98.2 F (36.8 C), temperature source Oral, resp. rate 16, height 5\' 7"  (1.702 m), weight 157 lb 12.8 oz (71.6 kg), SpO2 95 %.    Lymphatics: No palpable cervical, supraclavicular, axillary or inguinal lymph nodes. Resp: Lungs clear bilaterally. Cardio: Regular rate and rhythm. GI: No hepatosplenomegaly.  Tender at the low mid abdomen.  No mass. Vascular: No leg edema. Neuro: Alert and oriented. Skin: Radiation skin change with hyperpigmentation at the groin and perineum.  No skin breakdown.   Lab Results:  Lab Results  Component Value Date   WBC 4.3 09/30/2022   HGB 10.4 (L) 09/30/2022   HCT 32.6 (L) 09/30/2022   MCV 101.9 (H) 09/30/2022   PLT 183 09/30/2022   NEUTROABS 3.2 09/30/2022    Imaging:  No results found.  Medications: I have reviewed the patient's current medications.  Assessment/Plan: Anal cancer Anal mass noted on digital exam and colonoscopy 05/09/2022-biopsy consistent with squamous cell carcinoma CTs 05/12/2022-no evidence of thoracic metastatic disease, calcified right pleural plaques, asymmetric thickening of the right posterior wall of the distal rectum/anal canal, no evidence of metastatic adenopathy in the abdomen or pelvis, 2 small hypodense left liver lesions favored to represent benign cysts PET scan 05/30/2022-intense hypermetabolic activity at the anus.  No evidence of metastatic adenopathy.  Indeterminate small, mildly hypermetabolic lesion in the lateral segment of the left hepatic lobe. Radiation 06/06/2022-07/19/2022 Cycle one 5-FU/Mitomycin-C 06/06/2022 cycle two 5-FU/Mitomycin-C 07/04/2022 (5-FU dose reduced  secondary to diarrhea) MRI liver 06/15/2022-1 cm hypervascular mass in segment 2 of the left hepatic lobe, corresponds to the focus of hypermetabolism on recent PET-CT.  Imaging characteristics atypical for metastatic squamous cell carcinoma.  Follow-up MRI in 3 months Cycle two 5-FU/Mitomycin-C 07/04/2022 (5-FU dose reduced secondary to diarrhea) 08/29/2022 anoscopy, Dr. Maisie Fus, smaller posterior midline rectal mass approximately in the dentate line MRI liver 09/12/2022-enlargement of a complex cystic and solid-appearing enhancing lesion in segment 2 of the liver, 4 x 2.2 cm compared to 1.5 cm on previous study PET scan 09/23/2022-interval increase in size and tracer avidity of a solitary metastasis within the lateral segment of left hepatic lobe.  Tracer avid lesion involving the anus again noted, slightly decreased in size and tracer avidity. Biopsy liver lesion 09/30/2022-metastatic squamous cell carcinoma Depression Left breast cancer 1995 Recurrent left breast cancer 2003?  Status post a left mastectomy and TRAM Family history of multiple cancers she reports being negative for a BRCA mutation  Disposition: Alyssa Deleon appears unchanged.  She is accompanied to today's appointment by her daughter.  We reviewed the recent liver MRI and PET scan reports/images.  We reviewed the pathology report from the liver lesion biopsy showing metastatic squamous cell carcinoma.   We discussed the persistent hypermetabolic lesion at the anus, decreased in size and tracer uptake. 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  Dr. Truett Perna reviewed various potential options to treat the liver lesion including surgical resection, ablation, radiation.  She agrees to a referral to Dr. Modesta Messing at [facility].  She will continue close follow-up with Dr. Maisie Fus for surveillance anoscopy.  She will return for follow-up here in 2 to 3 weeks.  Patient seen with Dr. Truett Perna.  Alyssa Deleon  ANP/GNP-BC   10/05/2022  11:50 AM  This was a shared visit with Alyssa Deleon.  We reviewed the restaging MRI images with Alyssa Deleon.  We discussed the liver biopsy pathology.  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  There is a single or 2 adjacent left liver lesions.  The persistent hypermetabolism in the anal canal may represent resolving tumor.  She underwent an exam by Dr. Maisie Fus and a smaller anal mass was noted.  We discussed treatment options with Alyssa Deleon and her sister.  I presented her case at the GI tumor conference earlier today.  We will refer her to Dr. Modesta Messing to get his opinion regarding resection or ablation of the liver lesion.  We can continue following the anal mass as she is only few months out from completing radiation.  I explained the unusual nature of her clinical presentation.  There is a small chance of undergoing curative therapy with hepatic directed therapy and continued close follow-up of the anal lesion.  I was present for greater than 50% of today's visit.  I performed medical decision making.  Alyssa Bale, MD

## 2022-10-05 NOTE — Telephone Encounter
I called [name] to discuss potential ways that I would be able to provide assistance/offer supportive care and help her continue her radiation treatment. She has very tangential thought processes making the conversation difficult to have. She was perseverating about her phone not working, the trash not being taken out and that she also doesn't have a ride to come to RT/chemo. I offered to have our ride coordinator call her and she got very upset and told me that she wouldn't continue speaking with me as she was going to miss taking her trash to the curb. I tried several times to discuss the seriousness of the situation and my wish to evaluate her. She declined. I offered to call her daughter and/or husband and she told me they are absolutely too busy to speak to me. I am concerned about her mental status overall (although she was admitted and evaluated and discharged for this problem yesterday). My team is going to contact her family who is listed in the chart to discuss a plan and I will involved medical oncology as well.Alphonzo Dublin, MD, PhDYale Radiation OncologyJuly 10, 202412:04 PM

## 2022-10-06 ENCOUNTER — Inpatient Hospital Stay: Payer: Medicare Other | Admitting: Nurse Practitioner

## 2022-10-06 ENCOUNTER — Telehealth: Payer: Self-pay

## 2022-10-06 ENCOUNTER — Encounter
Admit: 2022-10-06 | Payer: PRIVATE HEALTH INSURANCE | Attending: Student in an Organized Health Care Education/Training Program | Primary: Internal Medicine

## 2022-10-06 ENCOUNTER — Inpatient Hospital Stay: Admit: 2022-10-06 | Payer: PRIVATE HEALTH INSURANCE | Primary: Internal Medicine

## 2022-10-06 ENCOUNTER — Encounter: Admit: 2022-10-06 | Payer: PRIVATE HEALTH INSURANCE | Attending: Clinical | Primary: Internal Medicine

## 2022-10-06 DIAGNOSIS — Z510 Encounter for antineoplastic radiation therapy: Secondary | ICD-10-CM

## 2022-10-06 DIAGNOSIS — Z5111 Encounter for antineoplastic chemotherapy: Secondary | ICD-10-CM

## 2022-10-06 DIAGNOSIS — C3411 Malignant neoplasm of upper lobe, right bronchus or lung: Secondary | ICD-10-CM

## 2022-10-06 DIAGNOSIS — C7801 Secondary malignant neoplasm of right lung: Secondary | ICD-10-CM

## 2022-10-06 NOTE — Telephone Encounter (Signed)
-----   Message from Lonna Cobb sent at 10/05/2022  1:02 PM EDT ----- Please call pathology and request the pathology report from brain surgery in 1999.  Thanks

## 2022-10-06 NOTE — Telephone Encounter (Signed)
I contacted [facility] regarding the report, and he indicated that the system does not have records dating back to 1999. The navigator will work on obtaining the report from [place] at [place].

## 2022-10-06 NOTE — Progress Notes
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 Patient qualifies for [facility] appointment with clinician by 10/18/22.

## 2022-10-06 NOTE — Progress Notes
Phone call to patient to schedule TOC visit.No answer- scheduled visit for 10/21/22- left detailed voicemail and advised to call should this date/time not work for her.

## 2022-10-06 NOTE — Progress Notes
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 Record Reviewed Yes Level of Care Ambulatory What medium(s) of communication were used with patient/family/caregiver? Telephone Psychosocial issues requiring intervention Social work referral for assessment / support. Psychosocial interventions I received a social work referral from Dr. Valora Corporal (Radiation Oncology Team) for Ms. Iglesias. I reviewed social work note written by Jerral Ralph, LCSW. I collaborated with Dr. Valora Corporal and Dr. Bluford Kaufmann. I left a voice mail message with my contact information for Ms. Leinbach on her cell and home phones. I also contacted her spouse who initially answered his phone and requested I re-contact him in a few minutes. However, I made attempts to call spouse back at his request but my call went to his voice mail. Message left for spouse. Team aware. Collaborations I collaborated with Dr. Valora Corporal and Dr. Bluford Kaufmann. Specific referrals to enhance community supports (include existing and new resources) None at this time. Handoff Required? No Next Steps/Plan (including hand-off): I updated team. Social work intervention. Re-consult social work if issues arise. Signature: Armando Gang, LCSW Contact Information: [phone]

## 2022-10-07 ENCOUNTER — Ambulatory Visit: Admit: 2022-10-07 | Payer: BLUE CROSS/BLUE SHIELD | Primary: Internal Medicine

## 2022-10-07 ENCOUNTER — Ambulatory Visit: Admit: 2022-10-07 | Payer: BLUE CROSS/BLUE SHIELD | Attending: Radiation Oncology | Primary: Internal Medicine

## 2022-10-07 DIAGNOSIS — C3411 Malignant neoplasm of upper lobe, right bronchus or lung: Secondary | ICD-10-CM

## 2022-10-07 DIAGNOSIS — Z5111 Encounter for antineoplastic chemotherapy: Secondary | ICD-10-CM

## 2022-10-07 DIAGNOSIS — Z510 Encounter for antineoplastic radiation therapy: Secondary | ICD-10-CM

## 2022-10-08 ENCOUNTER — Other Ambulatory Visit: Payer: Self-pay | Admitting: Physician Assistant

## 2022-10-08 NOTE — ED Provider Notes
Chief Complaint Patient presents with  Altered Mental Status --------------------------------------- Resident Note----------------------------------------------SynopsisDebra Audia is a 67 y.o. 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  She has been more confused, calling her daughter in the middle of the night and not making any sense.  She also has been forgetting things frequently and more argumentative.  Ms. Vosler says she feels fine and that she does not have any pain, does not wish to stay in the hospital.  Her daughter reports poor appetite.  No fevers, CP, SOB, AP, N/V/D, dysuria.  She was urged to come in by her oncologist.Pertinent Physical Exam findings:BP 139/74  - Pulse 60  - Temp 98.2 ?F (36.8 ?C) (Oral)  - Resp 15  - Ht 5' 1.5 (1.562 m)  - Wt 39.3 kg  - SpO2 99%  - BMI 16.11 kg/m? General: Alert, thin, no acute distress, oriented to self and placeHead: Normocephalic, atraumaticNeuro: CN grossly intact bilaterally, moving all four limbs spontaneously and against gravityCV: Heart is RRR, nl S1/S2, no murmurs auscultatedLungs: CTA BL, breathing well on RAAbdomen: soft, non-distended, non-tender to palpationExtremities: No LE edema or calf tendernessSkin: Warm and well-perfusedMDM/Plan:Patient presenting with AMS in setting of recently starting chemo and radiation for lung cancer. She is oriented to self and place but is adamant she does not want to stay in the hospital stating she has already had tests done before (but does not elaborate when) and they never show anything. She became quite upset during our conversation, yelling and refusing further testing but unable to give clear reasoning and saying that they caused this and they don't have any answers. At this point I do not think she has capacity to leave the ED. - C/f infection - will get labs, UA, CXR- C/f metastasis to brain, less likely ICH - CTH orderedPatient will at the very least require admission for AMS and failure to thrive. Signed out to evening resident with dispo pending workup.-------------------------------------------------------------------------------------------------------- Jannifer Franklin, MDEmergency Medicine Resident, PGY-3Available on Mobile HeartbeatDictation device may have been used, please excuse any spelling, word substitution or punctuation errors. MDM  Physical ExamED Triage Vitals [10/03/22 1150]BP: 131/71Pulse: 75Pulse from  O2 sat: n/aResp: 16Temp: 98.6 ?F (37 ?C)Temp src: OralSpO2: n/a BP 139/74  - Pulse 60  - Temp 98.2 ?F (36.8 ?C) (Oral)  - Resp 15  - Ht 5' 1.5 (1.562 m)  - Wt 39.3 kg  - SpO2 99%  - BMI 16.11 kg/m? Physical Exam ProceduresAttestation/Critical CarePatient Reevaluation: Attending Supervised: ResidentI saw and examined the patient. I agree with the findings and plan of care as documented in the resident's note except as noted below. Additional acute and/or chronic problems addressed: Yamilee Harmes Coupet64 year old female hx of non-small cell lung cancer currently receiving chemo-radiation, HCV p/w altered mental status. Patient's daughter states that the patient has been increasingly confused for the past week. Patient has been calling her, talking and not making full sense. Patient denies any concerns and wants to go home. Patient's daughter denies any concern for trauma. On exam she is speaking in full sentences, agitated, requesting to leave, nontoxic appearing. Dx: metabolic vs neurogenic vs infection, although suspicion lower. Plan for cbc/chem, UA, CXR, [place] head, anticipate admission.Comments as of 10/08/22 0935 Mon Oct 03, 2022 1508 Hx of lung ca on chemo x 2weeks ago, altere and confused, and decreased PO. Pending UA and ?admission, but no  capacity. [MA] 1708 I received sign-out from the outgoing provider. The care plan below was discussed with the off-going attending provider and includes the pertinent details of this patient's case, prior care, and current care plan.Disposition:Hx: 67 y.o. female w/ PMH of non-small cell lung ca pw AMS. Pt has been wandering at night. Dx: [place] head neg, UA pendingRx: 500 cc LRTo Do:[ ]  f/u UA[ ]  admit for AMSDoreen Agboh, MDAttending PhysicianDepartment of Emergency Medicine [DA] [address] [place] [address] wo IV ContrastIMPRESSION:  No acute intracranial abnormality. No masses identified. If there is further concern, consider MRI. [DA] 1712 CXRIMPRESSION: No acute cardiopulmonary findings are made.Chronic findings as discussed above. [DA] 1858 I believe the patient requires further evaluation for their presenting symptoms; thus, patient has been admitted for additional work-up and treatment for AMS.Please see admitting service documentation for further information regarding their medical course after initial evaluation. [DA] 1900 UA unremarkableAdmitted to Medicine [MA]  Comments User Index[DA] Constance Holster, MD[MA] Onnie Boer, Brita Romp, MD   Clinical Impressions as of 10/08/22 0935 AMS (altered mental status)  ED DispositionAdmit Jannifer Franklin, MDResident07/08/24 1538 Constance Holster, MD07/08/24 [address] MD07/13/24 [phone]

## 2022-10-10 ENCOUNTER — Encounter: Admit: 2022-10-10 | Payer: PRIVATE HEALTH INSURANCE | Attending: Clinical | Primary: Internal Medicine

## 2022-10-10 ENCOUNTER — Encounter
Admit: 2022-10-10 | Payer: PRIVATE HEALTH INSURANCE | Attending: Student in an Organized Health Care Education/Training Program | Primary: Internal Medicine

## 2022-10-10 ENCOUNTER — Ambulatory Visit: Admit: 2022-10-10 | Payer: BLUE CROSS/BLUE SHIELD | Primary: Internal Medicine

## 2022-10-10 DIAGNOSIS — Z5111 Encounter for antineoplastic chemotherapy: Secondary | ICD-10-CM

## 2022-10-10 DIAGNOSIS — C3411 Malignant neoplasm of upper lobe, right bronchus or lung: Secondary | ICD-10-CM

## 2022-10-10 DIAGNOSIS — Z510 Encounter for antineoplastic radiation therapy: Secondary | ICD-10-CM

## 2022-10-10 NOTE — Progress Notes
SOCIAL WORK NOTEPatient Name: Courtney Knox Record Number: [id number] Date of Birth: [birth year]Medical Social Work Follow Up  [employer] Most Recent Value Admission Information  Document Type Progress Note Reason for Current Social Work Regulatory affairs officer of Information Patient, Geophysicist/field seismologist, Medical Team Family/Caregiver Comment Spouse Medical Team Comment Dr. Katherina Right Record Reviewed Yes Level of Care Ambulatory What medium(s) of communication were used with patient/family/caregiver? Telephone Psychosocial issues requiring intervention Collaboration of Care / Support / Transportation. Psychosocial interventions 30 minutes were spent on the phone with [name] and spouse. I was informed by Dr. Katherina Right that she got in contact with [name] who agreed to come to her scheduled radiation treatment today. However, [name] did not have a ride to her treatment. I scheduled an one time courtesy [employer] ride for [name]. Despite my attempts coordinating her ride with [employer], they were unable to locate [name]'s address and did not pick her up. Dr. Katherina Right and I spoke with [name] who reported to be frustrated and upset with this situation. We provided support and encouragement for her to attend her radiation treatment on 7/16 which she agreed to this plan. Our phone call with [name] ended due her limited phone service and she did not participate further in this discussion. I also spoke with spouse who stated that he will transport [name] to all of her treatments starting tomorrow. Team updated. Collaborations I collaborated with Dr. Valora Corporal and Dr. Katherina Right. Specific referrals to enhance community supports (include existing and new resources) See above. Handoff Required? No Next Steps/Plan (including hand-off): [name] and spouse are aware of available social work services and have my contact information. Social work intervention completed. Re-consult social work if issues arise. Signature: Armando Gang, LCSW Contact Information: [phone]

## 2022-10-10 NOTE — Progress Notes
Radiation Oncology Call Spoke to [name], a patient of Dr Valora Corporal to discuss restarting treatments for her RUL NSCLC.   I explained to the patient that she has missed several treatments so far and further delays will impact radiation treatment effectiveness. Patient continues to have tangential thoughts stating several times that she is sweating and has lost something although cannot recall what she lost. Also that she doesn?t know how to use the phone. When redirected to discuss her care she got overwhelmed and stated that she has no way to get to our clinic today. I offered to speak with her daughter or husband but she declined stating that they are at work and she doesn?t want to bother them. We will discuss with our social worker Barbaraann Boys about providing a courtesy ride for today's 3:45pm treatment. Patient is willing to attend the appointment.Will alert Dr Valora Corporal.Karlene Einstein, MDPGY-4, Radiation OncologyDepartment of Therapeutic [phone]

## 2022-10-11 ENCOUNTER — Encounter: Admit: 2022-10-11 | Payer: PRIVATE HEALTH INSURANCE | Attending: Clinical | Primary: Internal Medicine

## 2022-10-11 ENCOUNTER — Ambulatory Visit: Admit: 2022-10-11 | Payer: BLUE CROSS/BLUE SHIELD | Attending: Hematology | Primary: Internal Medicine

## 2022-10-11 ENCOUNTER — Encounter: Admit: 2022-10-11 | Payer: PRIVATE HEALTH INSURANCE | Attending: Radiation Oncology | Primary: Internal Medicine

## 2022-10-11 ENCOUNTER — Inpatient Hospital Stay: Admit: 2022-10-11 | Discharge: 2022-10-11 | Payer: BLUE CROSS/BLUE SHIELD | Primary: Internal Medicine

## 2022-10-11 ENCOUNTER — Ambulatory Visit: Admit: 2022-10-11 | Payer: BLUE CROSS/BLUE SHIELD | Primary: Internal Medicine

## 2022-10-11 DIAGNOSIS — C3490 Malignant neoplasm of unspecified part of unspecified bronchus or lung: Secondary | ICD-10-CM

## 2022-10-11 DIAGNOSIS — B1920 Unspecified viral hepatitis C without hepatic coma: Secondary | ICD-10-CM

## 2022-10-11 DIAGNOSIS — K529 Noninfective gastroenteritis and colitis, unspecified: Secondary | ICD-10-CM

## 2022-10-11 DIAGNOSIS — Z5111 Encounter for antineoplastic chemotherapy: Secondary | ICD-10-CM

## 2022-10-11 DIAGNOSIS — C3411 Malignant neoplasm of upper lobe, right bronchus or lung: Secondary | ICD-10-CM

## 2022-10-11 DIAGNOSIS — Z510 Encounter for antineoplastic radiation therapy: Secondary | ICD-10-CM

## 2022-10-11 DIAGNOSIS — Z720 Tobacco use: Secondary | ICD-10-CM

## 2022-10-11 DIAGNOSIS — J449 Chronic obstructive pulmonary disease, unspecified: Secondary | ICD-10-CM

## 2022-10-11 NOTE — Progress Notes
ON TREATMENT VISIT NOTE Diagnosis: R lung cancerSite: RULCurrent RT dose: 3400 cGyPlanned RT Dose: 6000 cGyFx: 17Nursing Assessment: Pt and spouse present for OTV. Pt tolerated treatment today well.Physical Exam:BP 117/75  - Pulse 73  - Temp 98.4 ?F (36.9 ?C) (Oral)  - Resp 18  - Wt 41.1 kg  - SpO2 97%  - BMI 16.84 kg/m? Pain Score:   9Wt: 10/11/22 41.1 kg 10/04/22 39.3 kg 09/26/22 40.5 kg 09/23/22 40.7 kg 09/20/22 40.7 kg 09/13/22 42.2 kg A focused physical exam was performed and there were no focal deficits beyond baseline.Data Review:I have reviewed the patient's pertinent labs as resulted in the EMR.  Physician Assessment: Stable course with expected RT side effects. She returned after an extended absence of >1 week. Patient was very angry and frustrated by her acute cognitive decline over the past 3-4 weeks but especially over the past week, as well as the negative workup in the ED last week. She also feels whole-body pain. I have reached out to Dr. Valora Corporal and Dr. Maxcine Ham but suggested that she continue to come for RT since that aspect is extremely unlikely to be causing her problems.  Plan:Continue RT.Pain management: continue current pain regimen. A review of treatment set-up, dosimetry, and IGRT images has been performed. Alferd Apa, MD, MPHAssociate Professor, Radiation OncologyYale School of Medicine

## 2022-10-11 NOTE — Progress Notes
SOCIAL WORK ASSESSMENTPatient Name: Courtney Knox Record Number: [id number] Date of Birth: 05/11/1957Medical Social Work Assessment Adult  [employer] Most Recent Value Rendered Accommodations (Leave blank if none rendered or patient/family supplied their own hearing devices/glasses)  Other language interpreter used (non-ASL)? No Admission Information  Document Type Clinical Assessment - Able to Assess Reason for Current Social Work Involvement Support/Coping Source of Information Patient, Geophysicist/field seismologist, Medical Team Family/Caregiver Comment Spouse Medical Team Comment Radiation Oncology Team Record Reviewed Yes Level of Care Ambulatory What medium(s) of communication were used with patient/family/caregiver? Face-to-Face / In-Person Relationships  Marital Status Married Lives With Spouse Family circumstances unable to assess. Informal Supports (family, friends, church, etc) unable to assess. Formal Supports (current community resources and providers) unable to assess. Pertinent spiritual or cultural factors unable to assess. Relationship Comments unable to assess. Abuse Screen (yes response referral indicated)  Able to respond to abuse questions No Language needed None, Patient Speaks English Literacy Unable to assess Special Needs unable to assess. Social Determinants of Health  Financial Concerns Unable to Assess Vocational and employment status and history unable to assess. What is your living situation today? I have a steady place to live Think about the place you live. Do you have problems with any of the following? Patient declined In the past 12 months has the electric, gas, oil, or water company threatened to shut off services in your home? Patient declined Within the past 12 months, you worried that your food would run out before you got the money to buy more. Patient declined Within the past 12 months, the food you bought just didn't last and you didn't have money to get more. Patient declined In the past 12 months, has lack of transportation kept you from medical appointments or from getting medications? Patient declined In the past 12 months, has lack of transportation kept you from meetings, work, or from getting things needed for daily living? Patient declined Housing/Transportation/Environmental Comment (use soup kitchens, food pantries, eviction pending, unable to afford) unable to assess. Mental Status  Mental Status Unable to Assess Suicide Risk Assessment  Reason for Assessment Utilizing SAFE-T and C-SSRS (Check all that apply) Social Work Consult/Assessment C-SSRS Unable to Assess Specific Questions about Thoughts, Plans, Suicidal Intent (SAFE-T) Unable to Assess Risk Assessment  Access to Firearms? No  [unable to assess] Concern for patient utilizing another lethal method of self-harm, suicide, or harm to others No  [unable to assess] Risk Assessment Unable to Assess This patient was screened using the [place] Suicide Severity Rating Scale (CSSRS)  No This patient was not screened using the [place] Suicide Severity Rating Scale (CSSRS)  Patient did not choose to participate in this assessment. Cause for concern Unable to Assess Substance Use  Active substance use Unable to assess Formulation: Recommendation(s) and Intervention(s) (including for discharge to occur)  Psychosocial issues requiring intervention Assessment / Support / Resources. Psychosocial interventions 10 minutes were spent face to face with Courtney Knox. I attempted meet with Courtney Knox today after her radiation treatment.  I introduced myself in role and explained about social work services. She presented as irritable and frustrated. However, Courtney Knox did not want to participate further in his assessment. Her spouse then joined the discussion who requested to talk further with medical team. I consulted with Dr. Katherina Right who plans to contact spouse. I provided my business card. Collaborations I updated Dr. Valora Corporal and Dr. Katherina Right. Specific referrals to enhance community supports (include existing and new resources) None. Handoff Required? No Next Steps/Plan (including  hand-off): Courtney Knox did not participate in this assessment. Team updated. Social work intervention completed. Re-consult social work if issues arise. Signature: Armando Gang, LCSW Contact Information: [phone]

## 2022-10-12 ENCOUNTER — Other Ambulatory Visit: Payer: Self-pay | Admitting: Physician Assistant

## 2022-10-12 ENCOUNTER — Encounter: Payer: Self-pay | Admitting: Physician Assistant

## 2022-10-12 ENCOUNTER — Encounter: Payer: Self-pay | Admitting: Oncology

## 2022-10-12 ENCOUNTER — Inpatient Hospital Stay: Admit: 2022-10-12 | Discharge: 2022-10-12 | Payer: BLUE CROSS/BLUE SHIELD | Primary: Internal Medicine

## 2022-10-12 DIAGNOSIS — Z510 Encounter for antineoplastic radiation therapy: Secondary | ICD-10-CM

## 2022-10-12 DIAGNOSIS — Z5111 Encounter for antineoplastic chemotherapy: Secondary | ICD-10-CM

## 2022-10-12 DIAGNOSIS — C3411 Malignant neoplasm of upper lobe, right bronchus or lung: Secondary | ICD-10-CM

## 2022-10-12 DIAGNOSIS — R296 Repeated falls: Secondary | ICD-10-CM

## 2022-10-12 DIAGNOSIS — R531 Weakness: Secondary | ICD-10-CM

## 2022-10-12 DIAGNOSIS — C210 Malignant neoplasm of anus, unspecified: Secondary | ICD-10-CM

## 2022-10-12 NOTE — Telephone Encounter (Signed)
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 Will need liver surgery, radiation, chemo once monthly for 3 months will be done through [facility].  Needing 3 months of PT before then to prepare for surgery.  I will place PT order for her today.

## 2022-10-13 ENCOUNTER — Inpatient Hospital Stay: Admit: 2022-10-13 | Discharge: 2022-10-13 | Payer: BLUE CROSS/BLUE SHIELD | Primary: Internal Medicine

## 2022-10-13 DIAGNOSIS — C3411 Malignant neoplasm of upper lobe, right bronchus or lung: Secondary | ICD-10-CM

## 2022-10-13 DIAGNOSIS — Z5111 Encounter for antineoplastic chemotherapy: Secondary | ICD-10-CM

## 2022-10-13 DIAGNOSIS — Z510 Encounter for antineoplastic radiation therapy: Secondary | ICD-10-CM

## 2022-10-14 ENCOUNTER — Encounter: Payer: Self-pay | Admitting: Physician Assistant

## 2022-10-14 ENCOUNTER — Inpatient Hospital Stay: Admit: 2022-10-14 | Discharge: 2022-10-14 | Payer: BLUE CROSS/BLUE SHIELD | Primary: Internal Medicine

## 2022-10-14 ENCOUNTER — Ambulatory Visit: Admit: 2022-10-14 | Payer: BLUE CROSS/BLUE SHIELD | Primary: Internal Medicine

## 2022-10-14 ENCOUNTER — Encounter: Admit: 2022-10-14 | Payer: PRIVATE HEALTH INSURANCE | Attending: Radiation Oncology | Primary: Internal Medicine

## 2022-10-14 DIAGNOSIS — K529 Noninfective gastroenteritis and colitis, unspecified: Secondary | ICD-10-CM

## 2022-10-14 DIAGNOSIS — J449 Chronic obstructive pulmonary disease, unspecified: Secondary | ICD-10-CM

## 2022-10-14 DIAGNOSIS — C3411 Malignant neoplasm of upper lobe, right bronchus or lung: Secondary | ICD-10-CM

## 2022-10-14 DIAGNOSIS — B1920 Unspecified viral hepatitis C without hepatic coma: Secondary | ICD-10-CM

## 2022-10-14 DIAGNOSIS — Z510 Encounter for antineoplastic radiation therapy: Secondary | ICD-10-CM

## 2022-10-14 DIAGNOSIS — Z5111 Encounter for antineoplastic chemotherapy: Secondary | ICD-10-CM

## 2022-10-14 DIAGNOSIS — Z720 Tobacco use: Secondary | ICD-10-CM

## 2022-10-14 DIAGNOSIS — C3490 Malignant neoplasm of unspecified part of unspecified bronchus or lung: Secondary | ICD-10-CM

## 2022-10-14 NOTE — Progress Notes
ON TREATMENT VISIT NOTE Diagnosis: NSCLCSite: RUL LungCurrent RT dose: 4000 cGyPlanned RT Dose: 6600 cGyFx: 20Nursing Assessment: Pt tolerating tx well. Denies pain to treatment area. Loss of appetite nutrition consult placed. Fatigue endorsed. Reports itching to skin on back.Back pain improved. Has some decreased appetite. PE:BP 136/74  - Pulse 65  - Temp 97 ?F (36.1 ?C)  - Resp 18  - Wt 41.2 kg  - SpO2 99%  - BMI 16.88 kg/m? Pain Score:   0 - No painWt Readings from Last 3 Encounters: 10/14/22 41.2 kg 10/11/22 41.1 kg 10/04/22 39.3 kg   In NAD. Thin appearing. Karnofsky Score: Normal activity with effort,Karnofsky Score: 80Data Review:Lab Results Component Value Date  WBC 3.4 (L) 10/04/2022  RBC 4.10 10/04/2022  HGB 12.6 10/04/2022  HCT 38.50 10/04/2022  MCV 93.9 10/04/2022  MCH 30.7 10/04/2022  MCHC 32.7 10/04/2022  PLT 187 10/04/2022  MPV 8.6 10/04/2022 Lab Results Component Value Date  GLU 91 10/04/2022  BUN 8 10/04/2022  CREATININE 0.36 (L) 10/04/2022  CO2 26 10/04/2022  CL 104 10/04/2022  NA 138 10/04/2022  K 3.7 10/04/2022  CALCIUM 8.6 (L) 10/04/2022 Lab Results Component Value Date  GLU 91 10/04/2022  BUN 8 10/04/2022  CREATININE 0.36 (L) 10/04/2022  NA 138 10/04/2022  K 3.7 10/04/2022  CL 104 10/04/2022  CO2 26 10/04/2022  ALBUMIN 3.8 10/03/2022  PROT 6.5 10/03/2022  BILITOT 0.3 10/03/2022  ALKPHOS 54 10/03/2022  ALT 16 10/03/2022  GLOB 2.7 10/03/2022  CALCIUM 8.6 (L) 10/04/2022  ASSESSMENT AND PLAN:I had a long discussion with her and her husband about radiation, nutrition and support through RT. They will continue with RT. Will meet with RD. Encouraged high caloric intake. Continue radiation therapy..A review of treatment set-up, dosimetry, and portal images has been performed.

## 2022-10-17 ENCOUNTER — Inpatient Hospital Stay: Admit: 2022-10-17 | Discharge: 2022-10-17 | Payer: BLUE CROSS/BLUE SHIELD | Primary: Internal Medicine

## 2022-10-17 DIAGNOSIS — Z5111 Encounter for antineoplastic chemotherapy: Secondary | ICD-10-CM

## 2022-10-17 DIAGNOSIS — Z510 Encounter for antineoplastic radiation therapy: Secondary | ICD-10-CM

## 2022-10-17 DIAGNOSIS — C3411 Malignant neoplasm of upper lobe, right bronchus or lung: Secondary | ICD-10-CM

## 2022-10-18 ENCOUNTER — Inpatient Hospital Stay: Admit: 2022-10-18 | Discharge: 2022-10-18 | Payer: BLUE CROSS/BLUE SHIELD | Primary: Internal Medicine

## 2022-10-18 ENCOUNTER — Ambulatory Visit: Admit: 2022-10-18 | Payer: BLUE CROSS/BLUE SHIELD | Attending: Hematology | Primary: Internal Medicine

## 2022-10-18 ENCOUNTER — Ambulatory Visit: Admit: 2022-10-18 | Payer: PRIVATE HEALTH INSURANCE | Primary: Internal Medicine

## 2022-10-18 ENCOUNTER — Ambulatory Visit: Admit: 2022-10-18 | Payer: BLUE CROSS/BLUE SHIELD | Primary: Internal Medicine

## 2022-10-18 DIAGNOSIS — Z5111 Encounter for antineoplastic chemotherapy: Secondary | ICD-10-CM

## 2022-10-18 DIAGNOSIS — C3411 Malignant neoplasm of upper lobe, right bronchus or lung: Secondary | ICD-10-CM

## 2022-10-18 DIAGNOSIS — Z510 Encounter for antineoplastic radiation therapy: Secondary | ICD-10-CM

## 2022-10-19 ENCOUNTER — Inpatient Hospital Stay: Admit: 2022-10-19 | Discharge: 2022-10-19 | Payer: BLUE CROSS/BLUE SHIELD | Primary: Internal Medicine

## 2022-10-19 DIAGNOSIS — Z5111 Encounter for antineoplastic chemotherapy: Secondary | ICD-10-CM

## 2022-10-19 DIAGNOSIS — R451 Restlessness and agitation: Secondary | ICD-10-CM

## 2022-10-19 DIAGNOSIS — Z510 Encounter for antineoplastic radiation therapy: Secondary | ICD-10-CM

## 2022-10-19 DIAGNOSIS — C3490 Malignant neoplasm of unspecified part of unspecified bronchus or lung: Secondary | ICD-10-CM

## 2022-10-19 DIAGNOSIS — C3411 Malignant neoplasm of upper lobe, right bronchus or lung: Secondary | ICD-10-CM

## 2022-10-19 NOTE — Progress Notes
Oncology Nutrition AssessmentLocation of visit: Radiation oncologyType of visit: Initial assessmentReason for consult: pt underweightReferred by: Lurline Idol, RNPast Medical History: reviewed available dataNutritionally relevant medications/supplements: Current Outpatient Medications on File Prior to Visit Medication  b complex vitamins (B COMPLEX) tablet  calcium carbonate/vitamin D3 (CALCIUM WITH VITAMIN D3 ORAL)  EPCLUSA 400-100 mg per tablet  famotidine (PEPCID) 20 mg tablet  hydrOXYzine (ATARAX) 25 mg tablet  prochlorperazine (COMPAZINE) 10 mg tablet  sertraline (ZOLOFT) 25 mg tablet Nutritionally relevant labs:labsHeight:     61.5Wt: 10/14/22 41.2 kg 10/11/22 41.1 kg 10/04/22 39.3 kg 09/26/22 40.5 kg 09/23/22 40.7 kg 09/20/22 40.7 kg BMI:16.88 kg/m? IBW: 44kgWeight changes and assessment:weight has been stable x 1 month but remains underweight13.4% loss from April (in 3 months which is significant)Nutrition Focused Physical Findings: severe temporal depletion; moderate buccal depletionNutritional Needs:  1320-1540calories (30kcal/kg)  66gm pro(1.5g protein/kg ) Based on  CA with treatment, IBW  , repletion Diet history or considerationsFood Allergies/intolerances or aversions: no food allergies notedCultural/lifestyle/family needs: pt cites financial restraintsSupport system: spouseActivity level and/or physical limitations: pt ambulatoryNutrition assessment: Met with [name] to help optimize nutrition status however she was quite tangential. RD had difficulty redirecting to topic of nutrition so unable to obtain full nutrition history. Pt was sipping soda at time of visit but suspect she may not be eating adequate provisions routinely. She did have a Premier Protein shake as well so suggested she switch to Ensure Plus for greater calories. She is agreeable to utilize clinic samples when able d/t financial constraints. Nutrition Diagnosis:Severe Malnutrition   related to Increased needs aeb severe muscle and fat depletion and 13.5% weight loss in Intervention: Specific foods/beverages or groups , Commercial beverage  , Collaboration/referral to other providers  , and Goal Setting - Encourage high calorie, high protein foods with added condiments. Examples provided- Ensure, ideally TID but as able to procure. Encouraged pt to take advantage of clinic samples and alerted front desk to remind her if possible. - Discussed pt with care team (nsg, SW, resident)Goals by next nutrition visit:- pt to meet >90% nutrition needsEducation Materials provided:noneNutrition monitoring and evaluation: Liquid meal replacement or supplement , Amount of food, and Weight Learning assessment barriers: none Comprehension level: good based on discussion and questionsExpected compliance/motivation: good Follow up:As needed:Length of time spent with pt:   [id number] Signed by Marya Amsler, RD, October 18, 2022

## 2022-10-20 ENCOUNTER — Telehealth: Admit: 2022-10-20 | Payer: PRIVATE HEALTH INSURANCE | Attending: Internal Medicine | Primary: Internal Medicine

## 2022-10-20 ENCOUNTER — Encounter: Admit: 2022-10-20 | Payer: PRIVATE HEALTH INSURANCE | Attending: Hematology | Primary: Internal Medicine

## 2022-10-20 ENCOUNTER — Inpatient Hospital Stay: Admit: 2022-10-20 | Discharge: 2022-10-20 | Payer: BLUE CROSS/BLUE SHIELD | Primary: Internal Medicine

## 2022-10-20 DIAGNOSIS — Z510 Encounter for antineoplastic radiation therapy: Secondary | ICD-10-CM

## 2022-10-21 ENCOUNTER — Inpatient Hospital Stay: Admit: 2022-10-21 | Discharge: 2022-10-21 | Payer: BLUE CROSS/BLUE SHIELD | Primary: Internal Medicine

## 2022-10-21 ENCOUNTER — Encounter: Admit: 2022-10-21 | Payer: PRIVATE HEALTH INSURANCE | Attending: Radiation Oncology | Primary: Internal Medicine

## 2022-10-21 ENCOUNTER — Ambulatory Visit: Admit: 2022-10-21 | Payer: BLUE CROSS/BLUE SHIELD | Attending: Internal Medicine | Primary: Internal Medicine

## 2022-10-21 ENCOUNTER — Encounter: Admit: 2022-10-21 | Payer: PRIVATE HEALTH INSURANCE | Attending: Internal Medicine | Primary: Internal Medicine

## 2022-10-21 ENCOUNTER — Ambulatory Visit: Admit: 2022-10-21 | Payer: BLUE CROSS/BLUE SHIELD | Primary: Internal Medicine

## 2022-10-21 VITALS — BP 114/69 | HR 81 | Temp 97.50000°F | Resp 16 | Wt 91.0 lb

## 2022-10-21 DIAGNOSIS — Z5111 Encounter for antineoplastic chemotherapy: Secondary | ICD-10-CM

## 2022-10-21 DIAGNOSIS — K529 Noninfective gastroenteritis and colitis, unspecified: Secondary | ICD-10-CM

## 2022-10-21 DIAGNOSIS — Z510 Encounter for antineoplastic radiation therapy: Secondary | ICD-10-CM

## 2022-10-21 DIAGNOSIS — B1920 Unspecified viral hepatitis C without hepatic coma: Secondary | ICD-10-CM

## 2022-10-21 DIAGNOSIS — C3490 Malignant neoplasm of unspecified part of unspecified bronchus or lung: Secondary | ICD-10-CM

## 2022-10-21 DIAGNOSIS — R451 Restlessness and agitation: Secondary | ICD-10-CM

## 2022-10-21 DIAGNOSIS — J449 Chronic obstructive pulmonary disease, unspecified: Secondary | ICD-10-CM

## 2022-10-21 DIAGNOSIS — Z720 Tobacco use: Secondary | ICD-10-CM

## 2022-10-21 DIAGNOSIS — R4189 Other symptoms and signs involving cognitive functions and awareness: Secondary | ICD-10-CM

## 2022-10-21 LAB — URINALYSIS-MACROSCOPIC W/REFLEX MICROSCOPIC
BKR BILIRUBIN, UA: NEGATIVE
BKR BLOOD, UA: NEGATIVE
BKR GLUCOSE, UA: NEGATIVE
BKR KETONES, UA: NEGATIVE
BKR NITRITE, UA: NEGATIVE
BKR PH, UA: 6.5 /HPF — ABNORMAL HIGH (ref 5.5–7.5)
BKR SPECIFIC GRAVITY, UA: 1.022 (ref 1.005–1.030)
BKR UROBILINOGEN, UA (MG/DL): <2 mg/dL (ref <=2.0)

## 2022-10-21 LAB — URINE MICROSCOPIC     (BH GH LMW YH)
BKR RBC/HPF INSTRUMENT: 2 /HPF (ref 0–2)
BKR URINE SQUAMOUS EPITHELIAL CELLS, UA (NUMERIC): <1 /HPF (ref 0–5)
BKR WBC/HPF INSTRUMENT: 6 /HPF — ABNORMAL HIGH (ref 0–5)

## 2022-10-21 MED ORDER — ENSURE ORAL LIQUID
Freq: Three times a day (TID) | ORAL | 1 refills | 30.00000 days | Status: AC
Start: 2022-10-21 — End: ?

## 2022-10-21 MED ORDER — SERTRALINE 25 MG TABLET
25 | ORAL_TABLET | Freq: Every day | ORAL | 3 refills | 90.00000 days | Status: AC
Start: 2022-10-21 — End: 2023-03-28

## 2022-10-21 NOTE — Progress Notes
ON TREATMENT VISIT NOTE Diagnosis: NSCLCSite: RUL LungCurrent RT dose: 5000 cGyPlanned RT Dose: 6600 cGyFx: 25Nursing Assessment: Pt tolerating tx; Reports generalized malaise.No changes in breathing. No chest pain. PE:BP 114/69  - Pulse 81  - Temp 97.5 ?F (36.4 ?C)  - Resp 16  - Wt 41.3 kg  - BMI 16.93 kg/m? Pain Score:   0Wt Readings from Last 3 Encounters: 10/21/22 41.3 kg 10/21/22 41.7 kg 10/14/22 41.2 kg   Thin appearing. In NAD. Karnofsky Score: Normal activity with effort,Karnofsky Score: 80Data Review:No labs ordered today. ASSESSMENT AND PLAN:Tolerating RT with expected side effects. Patient did not endorse pain to me. We had a long conversation about systemic therapy, continued work up for cognitive changes and I recommended she complete RT and discuss again with Dr. Maxcine Ham about any role for systemic therapy now or in the future. Continue radiation therapy.A review of treatment set-up, dosimetry, and portal images has been performed.

## 2022-10-22 LAB — URINE CULTURE

## 2022-10-23 ENCOUNTER — Telehealth: Admit: 2022-10-23 | Payer: PRIVATE HEALTH INSURANCE | Attending: Family | Primary: Internal Medicine

## 2022-10-23 NOTE — Telephone Encounter
[facility] called as his wife [name] said she missed a call from [place]. I do not see a telephone note. I told mark I would send Irving Burton a message.Elenore Rota, APRN

## 2022-10-24 ENCOUNTER — Ambulatory Visit: Admit: 2022-10-24 | Payer: BLUE CROSS/BLUE SHIELD | Attending: Physician Assistant | Primary: Internal Medicine

## 2022-10-24 ENCOUNTER — Ambulatory Visit: Admit: 2022-10-24 | Payer: BLUE CROSS/BLUE SHIELD | Primary: Internal Medicine

## 2022-10-24 ENCOUNTER — Encounter: Admit: 2022-10-24 | Payer: PRIVATE HEALTH INSURANCE | Attending: Physician Assistant | Primary: Internal Medicine

## 2022-10-24 ENCOUNTER — Inpatient Hospital Stay: Admit: 2022-10-24 | Discharge: 2022-10-24 | Payer: BLUE CROSS/BLUE SHIELD | Primary: Internal Medicine

## 2022-10-24 DIAGNOSIS — Z720 Tobacco use: Secondary | ICD-10-CM

## 2022-10-24 DIAGNOSIS — R451 Restlessness and agitation: Secondary | ICD-10-CM

## 2022-10-24 DIAGNOSIS — C3490 Malignant neoplasm of unspecified part of unspecified bronchus or lung: Secondary | ICD-10-CM

## 2022-10-24 DIAGNOSIS — Z510 Encounter for antineoplastic radiation therapy: Secondary | ICD-10-CM

## 2022-10-24 DIAGNOSIS — J449 Chronic obstructive pulmonary disease, unspecified: Secondary | ICD-10-CM

## 2022-10-24 DIAGNOSIS — K529 Noninfective gastroenteritis and colitis, unspecified: Secondary | ICD-10-CM

## 2022-10-24 DIAGNOSIS — B1920 Unspecified viral hepatitis C without hepatic coma: Secondary | ICD-10-CM

## 2022-10-24 NOTE — Telephone Encounter
Spoke w/ patient's husband, [name], who is unsure how to proceed w/ patient's care. Per [name], patient's brain in gone, we lost her. This RN asked husband what he meant, he reported that she is confused, and unable to carry out normal, every day tasks. She is having daily nervous breakdowns and is no longer herself. Per [name], he has had to go back home after arriving to work due to patient calling him crying and confused. [name] is concerned about her safety, has started the process to get time off to help patient. Per [name], patient saw PCP last week, discussed in length how patient is confused, and the need for Brain MRI, however, patient is now unwilling to do the Brain MRI despite the need. Patient has agreed to continue going to RT appts, however, is afraid to do Chemo as she feels that the Chemo poisoned her, and that this is the reason why she is no longer the same. Patient expressed these concerns to PCP as well per chart review (see note from V. Mayford Knife, APRN on 7/26). Patient's husband reported that this patient will be coming into [facility] today for radiation. Unsure if she can be seen while she is in [facility]. This RN will review with APP team and Dr. Maxcine Ham.

## 2022-10-24 NOTE — Telephone Encounter
Called patient's husband and informed him of appt today at 3:30 pm w/ PA after RT.Mark agreed to appt, will try his best for patient to come up for appt.

## 2022-10-25 ENCOUNTER — Other Ambulatory Visit: Payer: Self-pay | Admitting: *Deleted

## 2022-10-25 ENCOUNTER — Other Ambulatory Visit (HOSPITAL_BASED_OUTPATIENT_CLINIC_OR_DEPARTMENT_OTHER): Payer: Self-pay

## 2022-10-25 ENCOUNTER — Inpatient Hospital Stay: Payer: Medicare Other | Admitting: Nurse Practitioner

## 2022-10-25 ENCOUNTER — Encounter: Payer: Self-pay | Admitting: *Deleted

## 2022-10-25 ENCOUNTER — Encounter: Payer: Self-pay | Admitting: Oncology

## 2022-10-25 ENCOUNTER — Other Ambulatory Visit: Payer: Self-pay

## 2022-10-25 ENCOUNTER — Encounter: Payer: Self-pay | Admitting: Nurse Practitioner

## 2022-10-25 ENCOUNTER — Encounter: Payer: Self-pay | Admitting: Physician Assistant

## 2022-10-25 ENCOUNTER — Ambulatory Visit: Payer: Medicare Other | Admitting: Physical Therapy

## 2022-10-25 ENCOUNTER — Encounter: Admit: 2022-10-25 | Payer: PRIVATE HEALTH INSURANCE | Attending: Physician Assistant | Primary: Internal Medicine

## 2022-10-25 ENCOUNTER — Ambulatory Visit: Admit: 2022-10-25 | Payer: BLUE CROSS/BLUE SHIELD | Primary: Internal Medicine

## 2022-10-25 ENCOUNTER — Telehealth: Admit: 2022-10-25 | Payer: PRIVATE HEALTH INSURANCE | Attending: Hematology | Primary: Internal Medicine

## 2022-10-25 ENCOUNTER — Inpatient Hospital Stay: Admit: 2022-10-25 | Discharge: 2022-10-25 | Payer: BLUE CROSS/BLUE SHIELD | Primary: Internal Medicine

## 2022-10-25 VITALS — BP 98/67 | HR 100 | Temp 98.1°F | Resp 18 | Ht 67.0 in | Wt 150.0 lb

## 2022-10-25 DIAGNOSIS — Z510 Encounter for antineoplastic radiation therapy: Secondary | ICD-10-CM

## 2022-10-25 DIAGNOSIS — Z720 Tobacco use: Secondary | ICD-10-CM

## 2022-10-25 DIAGNOSIS — B1920 Unspecified viral hepatitis C without hepatic coma: Secondary | ICD-10-CM

## 2022-10-25 DIAGNOSIS — J449 Chronic obstructive pulmonary disease, unspecified: Secondary | ICD-10-CM

## 2022-10-25 DIAGNOSIS — C3490 Malignant neoplasm of unspecified part of unspecified bronchus or lung: Secondary | ICD-10-CM

## 2022-10-25 DIAGNOSIS — K529 Noninfective gastroenteritis and colitis, unspecified: Secondary | ICD-10-CM

## 2022-10-25 DIAGNOSIS — C210 Malignant neoplasm of anus, unspecified: Secondary | ICD-10-CM

## 2022-10-25 MED ORDER — DEXAMETHASONE 2 MG PO TABS
10.0000 mg | ORAL_TABLET | Freq: Every day | ORAL | 0 refills | Status: AC
Start: 2022-10-25 — End: 2022-10-27
  Filled 2022-10-25: qty 10, 2d supply, fill #0

## 2022-10-25 NOTE — Progress Notes (Signed)
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

## 2022-10-25 NOTE — Progress Notes (Signed)
PDL-1 testing requested on accession number  [id number]

## 2022-10-25 NOTE — Progress Notes (Signed)
[facility] OFFICE PROGRESS NOTE   Diagnosis: Anal cancer  INTERVAL HISTORY:   Alyssa Deleon returns as scheduled.  She continues to have diarrhea alternating with constipation.  1 Lomotil tab causes significant constipation.  She intermittently notes a mucousy discharge after a bowel movement.  She feels weak.  Appetite is poor.  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  She has had multiple falls over the past several years.  Objective:  Vital signs in last 24 hours:  Blood pressure 98/67, pulse 100, temperature 98.1 F (36.7 C), temperature source Oral, resp. rate 18, height 5\' 7"  (1.702 m), weight 150 lb (68 kg), SpO2 98%.    HEENT: No thrush or ulcers. Lymphatics: No palpable cervical or supraclavicular lymph nodes. Resp: Lungs clear bilaterally. Cardio: Regular rate and rhythm. GI: No hepatosplenomegaly. Vascular: No leg edema.  Lab Results:  Lab Results  Component Value Date   WBC 4.3 09/30/2022   HGB 10.4 (L) 09/30/2022   HCT 32.6 (L) 09/30/2022   MCV 101.9 (H) 09/30/2022   PLT 183 09/30/2022   NEUTROABS 3.2 09/30/2022    Imaging:  No results found.  Medications: I have reviewed the patient's current medications.  Assessment/Plan: Anal cancer Anal mass noted on digital exam and colonoscopy 05/09/2022-biopsy consistent with squamous cell carcinoma CTs 05/12/2022-no evidence of thoracic metastatic disease, calcified right pleural plaques, asymmetric thickening of the right posterior wall of the distal rectum/anal canal, no evidence of metastatic adenopathy in the abdomen or pelvis, 2 small hypodense left liver lesions favored to represent benign cysts PET scan 05/30/2022-intense hypermetabolic activity at the anus.  No evidence of metastatic adenopathy.  Indeterminate small, mildly hypermetabolic lesion in the lateral segment of the left hepatic lobe. Radiation  06/06/2022-07/19/2022 Cycle one 5-FU/Mitomycin-C 06/06/2022 cycle two 5-FU/Mitomycin-C 07/04/2022 (5-FU dose reduced secondary to diarrhea) MRI liver 06/15/2022-1 cm hypervascular mass in segment 2 of the left hepatic lobe, corresponds to the focus of hypermetabolism on recent PET-CT.  Imaging characteristics atypical for metastatic squamous cell carcinoma.  Follow-up MRI in 3 months Cycle two 5-FU/Mitomycin-C 07/04/2022 (5-FU dose reduced secondary to diarrhea) 08/29/2022 anoscopy, Dr. Maisie Fus, smaller posterior midline rectal mass approximately in the dentate line MRI liver 09/12/2022-enlargement of a complex cystic and solid-appearing enhancing lesion in segment 2 of the liver, 4 x 2.2 cm compared to 1.5 cm on previous study PET scan 09/23/2022-interval increase in size and tracer avidity of a solitary metastasis within the lateral segment of left hepatic lobe.  Tracer avid lesion involving the anus again noted, slightly decreased in size and tracer avidity. 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 Modesta Messing 10/10/2022, presented at multidisciplinary clinic-recommendation for 3 months of treatment with systemic therapy to address the liver followed by restaging scan and also MRI of the pelvis to assess the primary site  Depression Left breast cancer 1995 Recurrent left breast cancer 2003?  Status post a left mastectomy and TRAM Family history of multiple cancers she reports being negative for a BRCA mutation  Disposition: Alyssa Deleon appears unchanged.  We reviewed the recommendation by Dr. Modesta Messing for 3 months of systemic therapy to be followed by restaging studies.  She agrees with this plan.  Dr. Truett Perna recommends Taxol/carboplatin on a 3-week schedule.  We reviewed potential side effects associated with chemotherapy including bone marrow toxicity, nausea, hair loss, mouth sores, diarrhea or constipation.  We discussed the potential for an allergic reaction with either Taxol  or  carboplatin.  She understands the rationale for the dexamethasone premedication at home.  A prescription was sent to her pharmacy for this.  We discussed the neuropathy and arthralgias associated with Taxol.  She understands pre-existing neuropathy symptoms could worsen.  She is willing to accept this risk.  We discussed the recommendation for white cell growth factor support.  We reviewed potential side effects including bone pain, rash, splenic rupture.  She agrees to proceed.  She has poor peripheral venous access.  We discussed a PICC line.  She prefers a Port-A-Cath.  She is scheduled for Port-A-Cath placement 10/31/2022.  She will return for cycle 1 Taxol/carboplatin on 11/01/2022.  We will see her in follow-up prior to cycle 2 11/22/2022.  Patient seen with Dr. Truett Perna.   Lonna Cobb ANP/GNP-BC   10/25/2022  2:40 PM  This was a shared visit with Lonna Cobb.  Alyssa Deleon saw Dr. Modesta Messing earlier this month.  He recommends 3 months of systemic therapy prior to restaging and consideration for surgery.  Alyssa Deleon plans to begin physical therapy later this week.  I recommend Taxol/carboplatin chemotherapy.  We reviewed potential toxicities associated with this regimen including the chance of hematologic toxicity with infection or bleeding.  We discussed the potential for neuropathy with paclitaxel.  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  I think it is unlikely she will develop significant progression of neuropathy with 4 cycles of paclitaxel given on a 3-week schedule, but this is possible.  She agrees to proceed.  A chemotherapy plan was entered today.  I was present for greater than 50% of today's visit.  I performed medical decision making.  Mancel Bale, MD

## 2022-10-25 NOTE — Progress Notes (Signed)
PATIENT NAVIGATOR PROGRESS NOTE  Name: Alyssa Deleon Date: 10/25/2022 MRN: [id number]  DOB: 10-Aug-1955   Reason for visit:  New treatment and port placement  Comments:  [name] will begin new tx next week and will need PAC placed.  Scheduled at [facility] for Monday 8/5 with arrival time of 10:00 and port placement at 12:00 Written instructions provided and questions answered    Time spent counseling/coordinating care: 45-60 minutes

## 2022-10-25 NOTE — Progress Notes
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 female active smoker (~50 pack-years) with a history of COPD and a now biopsy-proven NSCLC (keratinizing SCC) of the RUL with invasion of the posterior chest wall and ribs 5-6 (PD-L1 TPS 5%, at least cT3N0). She additionally has lesion in the LEFT lung currently under surveillance. 06/25/2022 [place] A/P showed findings concerning for colitis predominant involvement of the right colon and cecum. 07/01/2022 [place] chest showed RIGHT upper lobe mass with chest wall invasion. Mass measures 5.1 x 3.3 x 4.9 cm. This mass erodes into the posterior chest wall with adjacent erosion of the RIGHT posterior fifth and sixth ribs. 07/06/2022 FEV1 37% predicted, FEV1/FVC 35%, DLCO 20%. Severe obstruction, severely impaired diffusing capacity. 07/09/2022 MRI brain with no evidence of metastatic disease to the brain. 07/16/2022 [facility] PET/[place] showed intensely hypermetabolic RIGHT upper lobe pleural based lung mass with chest wall and right 5-6th ribs invasion. Biapical hypermetabolic scarring with more prominent on the LEFT apical nodularity, nonspecific but concerning for malignant involvement.She met with Dr Maxcine Ham (med-onc) and Dr Valora Corporal (Rad onc) - decision reached ot proceed with concurrent chemo/RTCURRENT THERAPY: C1D1 carbo/taxol weekly w RT on 6/11/24INTERIM HISTORY:Priority visit for Pt and husband after he called reporting ongoing and concerning changes in his wife. They are seen together today after radiation today but she is not pleased about being here and states she doesn't think she needs to be.Yesterday her husband [name] called reporting, patient's brain in gone, we lost her. He reported that she is increasingly confused, and unable to carry out normal, every day tasks. She is having daily nervous breakdowns and is no longer herself. Per [name], he has had to go back home after arriving to work due to patient calling him crying and confused and not wanting to be alone. [name] is concerned about her safety, has started the process to get time off to help patient. Per [name], patient saw PCP last week, discussed in length how patient is confused, and the need for Brain MRI, however, patient has been unwilling to do the Brain MRI despite the recommendation from multiple providers. Patient has agreed to continue going to RT appts, however, is afraid to do Chemo as she feels that the Chemo poisoned her (see note from V. Mayford Knife, APRN on 7/26). On questioning Pt cannot easily recall events of yesterday when she called her husband to come home and is easily agitated in conversation with me and is very tangential having difficulty focusing on the topic at hand wo bringing up issues of wanting to know who has ordered her past MRI's and who directed her to the ED for her symptoms without being able to elaborate why she wants to know and what she will do with this information.She attributes her confusion and frustration at home to a cluttered house that she cannot manage to get clean and organized. [name] states that she often needs directions on how to use her phone or items in the house.She did get radiation today but continues to refuse to consider more chemotherapy.She denies significant cough or shortness of breath. REVIEW OF PAST MEDICAL,SURGICAL,SOCIAL,FAMILY HISTORY: reviewed and unchangedShe  has a past medical history of Colitis, COPD (chronic obstructive pulmonary disease) (HC Code), HCV (hepatitis C virus), Lung cancer (HC Code), and Tobacco abuse.She has had a Left wrist surgery. 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 Continues to smoke with roughly 40 pack year history. FAMILY HISTORY:  Father, lung cancer; brother, lymphomaReview of SystemsPertinent items are noted in HPI.A 12- point review of systems, was otherwise negative except as stated in the HPI. ECOG 1ALLERGIES: PenicillinMEDICATIONS: Patient has a current medication list which includes the following prescription(s): b complex vitamins (B COMPLEX) tablet - Take 1 tablet by mouth daily, calcium carbonate/vitamin D3 (CALCIUM WITH VITAMIN D3 ORAL) - Take 1 tablet by mouth daily, EPCLUSA 400-100 mg per tablet - Take 1 tablet by mouth daily, famotidine (PEPCID) 20 mg tablet - Take 1 tablet (20 mg total) by mouth daily, nutritional supplement (ENSURE) liquid - Take 296 mLs by mouth 3 (three) times daily with meals, prochlorperazine (COMPAZINE) 10 mg tablet - Take 1 tablet (10 mg total) by mouth every 6 (six) hours as needed (for nausea or vomiting), and sertraline (ZOLOFT) 25 mg tablet - Take 1 tablet (25 mg total) by mouth daily.PHYSICAL EXAM:BP 127/70  - Pulse 85  - Temp 97.6 ?F (36.4 ?C) (Temporal)  - Resp 18  - Wt 42.9 kg  - SpO2 99%  - BMI 17.59 kg/m? Temp:  [97.6 ?F (36.4 ?C)] 97.6 ?F (36.4 ?C)Pulse:  [85] 85Resp:  [18] 18BP: (127)/(70) 127/70SpO2:  [99 %] 99 %Physical ExamConstitutional:     Appearance: Normal appearance. HENT:    Head: Normocephalic and atraumatic. Eyes:    Conjunctiva/sclera: Conjunctivae normal. Cardiovascular:    Rate and Rhythm: Normal rate and regular rhythm. Pulmonary:    Effort: Pulmonary effort is normal.    Breath sounds: No wheezing, rhonchi or rales.    Comments: Breath sounds decreased to lung basesAbdominal:    General: Bowel sounds are normal. There is no distension.    Palpations: Abdomen is soft.    Tenderness: There is no abdominal tenderness. Musculoskeletal:       General: Normal range of motion.    Cervical back: Normal range of motion.    Right lower leg: No edema.    Left lower leg: No edema.    Comments: TTP to right upper back Lymphadenopathy:    Cervical: No cervical adenopathy. Skin:   General: Skin is warm and dry.    Findings: No erythema. Neurological:    General: No focal deficit present.    Mental Status: She is alert. Psychiatric:       Mood and Affect: Mood is anxious. Affect is angry.       Speech: Speech normal.       Behavior: Behavior is uncooperative and agitated.       Thought Content: Thought content is not delusional. Thought content does not include homicidal or suicidal ideation.       Cognition and Memory: She exhibits impaired recent memory.    Comments: Easily agitated and tangential in conversation and was not able to explain reasons for wanting certain information about who and why MRI's and ED visits were requested.  Data Review:Lab Results Component Value Date  WBC 3.4 (L) 10/04/2022  RBC 4.10 10/04/2022  HGB 12.6 10/04/2022  HCT 38.50 10/04/2022  MCV 93.9 10/04/2022  MCH 30.7 10/04/2022  MCHC 32.7 10/04/2022  PLT 187 10/04/2022  MPV 8.6 10/04/2022 Lab Results Component Value Date  GLU 91 10/04/2022  BUN 8 10/04/2022  CREATININE 0.36 (L) 10/04/2022  NA 138 10/04/2022  K 3.7 10/04/2022  CL 104 10/04/2022  CO2 26 10/04/2022  ALBUMIN 3.8 10/03/2022  PROT 6.5 10/03/2022  BILITOT 0.3 10/03/2022  ALKPHOS  54 10/03/2022  ALT 16 10/03/2022  GLOB 2.7 10/03/2022  CALCIUM 8.6 (L) 10/04/2022  IMPRESSION:  Malignant neoplasm of bronchus and lung (HC Code)  (primary encounter diagnosis) AgitationMs Courtney Knox is a 39 yer old woman diagnosed in the spring of 2024 with 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 She did ultimately go to the ED but left before any testing was performed. 09/13/22 - Due for the second week of chemotherapy. Her abdominal pain is much improved, she was able to have a small normal BM this morning, and was able to eat a sandwich last night. Overall feeling much better today. Encouraged her to monitor her symptoms and to call for evaluation sooner should the abdominal pain return. 09/26/22 - Today she presents to continue treatment. Reports side effects and symptoms as noted above. Mild esophagitis managed with OTC meds, does not want to start lidocaine yet. Labs, vitals, ROS reviewed, physical exam completed. Patient meets criteria to proceed with treatment today. PLAN: NSCLC - Pt continues to refuse additional chemotherapy but has agreed to continue daily RT- I discussed my and many other provider's documented concerns for the changes in her behavior and mood that have been expressed by her husband and that bMRI is appropriate to help evaluate this. While initially agreeing to proceed with out-Pt scheduling of this urgently, she stated on leaving to schedule that she was not sure she would agree to do it. I urged her to schedule this so we can appropriately evaluate and care for her. I encouraged her husband to call 911 or take her to the ED if he felt her behavior was unsafe or potentially put her or him or other family members at risk.Constipation - continue miralax daily as needed. Esophagitis - mild. Continue OTC topical meds for now. Lidocaine PRNPain - resolved	- tumor related pain to her right upper back. Responds to naproxen but she is using this sparingly. PO intake decreased - weight is up this visitPain - Pt would not remain at visit today to address/discuss pain reportedDecreased hearing - will consider referral to ENT/audiology at next visit. Behavioral changes concerning for CNS disease though April bMRI was negative for mets and [place] head 7/8 wo acute abnormality.- Pt was seen in ED 7/9 for confusion and was prescribed Zoloft and hydroxyzine but Pt never started d/t concerns new meds would render her unable to manage her life and responsibilities.	-bMRI scheduled for 8/2. If negative, though Pt is unlikely to accept, would recommend formal psychiatry evaluation.Will schedule f/u with Dr. Maxcine Ham and arrange for post-RT imagingJ. Alden Hipp, PA-C(Taris Galindo in Epic)Thoracic Medical Oncology

## 2022-10-25 NOTE — Telephone Encounter
Called patient on 7/30 @ 12:33 pm left a voicemail to inform patient of MRI appt scheduled on 8/2 and also return visit to see Dr. Maxcine Ham in [facility] on 8/13. I provided the patient appointment date and time.

## 2022-10-26 ENCOUNTER — Ambulatory Visit: Admit: 2022-10-26 | Payer: BLUE CROSS/BLUE SHIELD | Primary: Internal Medicine

## 2022-10-26 ENCOUNTER — Inpatient Hospital Stay: Admit: 2022-10-26 | Discharge: 2022-10-26 | Payer: BLUE CROSS/BLUE SHIELD | Primary: Internal Medicine

## 2022-10-26 DIAGNOSIS — J449 Chronic obstructive pulmonary disease, unspecified: Secondary | ICD-10-CM

## 2022-10-26 DIAGNOSIS — C3490 Malignant neoplasm of unspecified part of unspecified bronchus or lung: Secondary | ICD-10-CM

## 2022-10-26 DIAGNOSIS — K5900 Constipation, unspecified: Secondary | ICD-10-CM

## 2022-10-26 DIAGNOSIS — Z87891 Personal history of nicotine dependence: Secondary | ICD-10-CM

## 2022-10-26 DIAGNOSIS — Z801 Family history of malignant neoplasm of trachea, bronchus and lung: Secondary | ICD-10-CM

## 2022-10-26 DIAGNOSIS — M546 Pain in thoracic spine: Secondary | ICD-10-CM

## 2022-10-26 DIAGNOSIS — Z79633 Long term (current) use of mitotic inhibitor: Secondary | ICD-10-CM

## 2022-10-26 DIAGNOSIS — Z807 Family history of other malignant neoplasms of lymphoid, hematopoietic and related tissues: Secondary | ICD-10-CM

## 2022-10-26 DIAGNOSIS — Z923 Personal history of irradiation: Secondary | ICD-10-CM

## 2022-10-26 DIAGNOSIS — Z79899 Other long term (current) drug therapy: Secondary | ICD-10-CM

## 2022-10-26 DIAGNOSIS — Z880 Allergy status to penicillin: Secondary | ICD-10-CM

## 2022-10-26 DIAGNOSIS — F17210 Nicotine dependence, cigarettes, uncomplicated: Secondary | ICD-10-CM

## 2022-10-26 DIAGNOSIS — Z510 Encounter for antineoplastic radiation therapy: Secondary | ICD-10-CM

## 2022-10-26 DIAGNOSIS — K209 Esophagitis, unspecified without bleeding: Secondary | ICD-10-CM

## 2022-10-26 DIAGNOSIS — Z5111 Encounter for antineoplastic chemotherapy: Secondary | ICD-10-CM

## 2022-10-26 DIAGNOSIS — R451 Restlessness and agitation: Secondary | ICD-10-CM

## 2022-10-26 DIAGNOSIS — Z9221 Personal history of antineoplastic chemotherapy: Secondary | ICD-10-CM

## 2022-10-26 DIAGNOSIS — C3411 Malignant neoplasm of upper lobe, right bronchus or lung: Secondary | ICD-10-CM

## 2022-10-26 DIAGNOSIS — Z79630 Long term (current) use of alkylating agent: Secondary | ICD-10-CM

## 2022-10-26 NOTE — Progress Notes (Signed)
Pharmacist Chemotherapy Monitoring - Initial Assessment    Anticipated start date: 11/03/22   The following has been reviewed per standard work regarding the patient's treatment regimen: The patient's diagnosis, treatment plan and drug doses, and organ/hematologic function Lab orders and baseline tests specific to treatment regimen  The treatment plan start date, drug sequencing, and pre-medications Prior authorization status  Patient's documented medication list, including drug-drug interaction screen and prescriptions for anti-emetics and supportive care specific to the treatment regimen The drug concentrations, fluid compatibility, administration routes, and timing of the medications to be used The patient's access for treatment and lifetime cumulative dose history, if applicable  The patient's medication allergies and previous infusion related reactions, if applicable   Changes made to treatment plan:  N/A  Follow up needed:  Pending authorization for treatment    Daylene Katayama, RPH, 10/26/2022  8:57 AM

## 2022-10-27 ENCOUNTER — Other Ambulatory Visit: Payer: Self-pay | Admitting: *Deleted

## 2022-10-27 ENCOUNTER — Other Ambulatory Visit: Payer: Self-pay | Admitting: Nurse Practitioner

## 2022-10-27 ENCOUNTER — Ambulatory Visit (INDEPENDENT_AMBULATORY_CARE_PROVIDER_SITE_OTHER): Payer: Medicare Other | Admitting: Physical Therapy

## 2022-10-27 ENCOUNTER — Encounter: Payer: Self-pay | Admitting: Physician Assistant

## 2022-10-27 ENCOUNTER — Telehealth (INDEPENDENT_AMBULATORY_CARE_PROVIDER_SITE_OTHER): Payer: Medicare Other | Admitting: Physician Assistant

## 2022-10-27 ENCOUNTER — Encounter: Payer: Self-pay | Admitting: Oncology

## 2022-10-27 ENCOUNTER — Other Ambulatory Visit (HOSPITAL_BASED_OUTPATIENT_CLINIC_OR_DEPARTMENT_OTHER): Payer: Self-pay

## 2022-10-27 ENCOUNTER — Ambulatory Visit: Admit: 2022-10-27 | Payer: BLUE CROSS/BLUE SHIELD | Attending: Radiation Oncology | Primary: Internal Medicine

## 2022-10-27 ENCOUNTER — Inpatient Hospital Stay: Admit: 2022-10-27 | Discharge: 2022-10-27 | Payer: BLUE CROSS/BLUE SHIELD | Primary: Internal Medicine

## 2022-10-27 ENCOUNTER — Ambulatory Visit: Admit: 2022-10-27 | Payer: BLUE CROSS/BLUE SHIELD | Primary: Internal Medicine

## 2022-10-27 VITALS — Ht 67.0 in | Wt 148.0 lb

## 2022-10-27 DIAGNOSIS — Z510 Encounter for antineoplastic radiation therapy: Secondary | ICD-10-CM

## 2022-10-27 DIAGNOSIS — R2681 Unsteadiness on feet: Secondary | ICD-10-CM

## 2022-10-27 DIAGNOSIS — M6281 Muscle weakness (generalized): Secondary | ICD-10-CM | POA: Diagnosis not present

## 2022-10-27 DIAGNOSIS — R159 Full incontinence of feces: Secondary | ICD-10-CM | POA: Diagnosis not present

## 2022-10-27 DIAGNOSIS — G894 Chronic pain syndrome: Secondary | ICD-10-CM

## 2022-10-27 DIAGNOSIS — C210 Malignant neoplasm of anus, unspecified: Secondary | ICD-10-CM | POA: Diagnosis not present

## 2022-10-27 DIAGNOSIS — J432 Centrilobular emphysema: Secondary | ICD-10-CM | POA: Diagnosis not present

## 2022-10-27 DIAGNOSIS — R197 Diarrhea, unspecified: Secondary | ICD-10-CM

## 2022-10-27 DIAGNOSIS — R296 Repeated falls: Secondary | ICD-10-CM

## 2022-10-27 DIAGNOSIS — R262 Difficulty in walking, not elsewhere classified: Secondary | ICD-10-CM

## 2022-10-27 DIAGNOSIS — R152 Fecal urgency: Secondary | ICD-10-CM

## 2022-10-27 MED ORDER — BREZTRI AEROSPHERE 160-9-4.8 MCG/ACT IN AERO
2.0000 | INHALATION_SPRAY | Freq: Two times a day (BID) | RESPIRATORY_TRACT | 11 refills | Status: DC
Start: 2022-10-27 — End: 2023-04-15
  Filled 2022-10-27: qty 10.7, 30d supply, fill #0

## 2022-10-27 MED ORDER — NYSTATIN 100000 UNIT/ML MT SUSP
5.0000 mL | Freq: Four times a day (QID) | OROMUCOSAL | 1 refills | Status: DC
  Filled 2022-10-27: qty 240, 12d supply, fill #0

## 2022-10-27 NOTE — Progress Notes (Signed)
Virtual Visit via Video Note  I connected with  Alyssa Deleon  on 10/30/22 at 11:00 AM EDT by a video enabled telemedicine application and verified that I am speaking with the correct person using two identifiers.  Location: Patient: home Provider: Nature conservation officer at [employer] Persons present: Patient and myself   I discussed the limitations of evaluation and management by telemedicine and the availability of in person appointments. The patient expressed understanding and agreed to proceed.   History of Present Illness:  67 yo female presenting for virtual visit to discuss chronic concerns, updates. 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  Starting chemotherapy next week, has a chemo port now.  Ongoing pain. Right hip, knee, severe / constant pain. Abdominal pain & bloating. Ongoing diarrhea daily, numerous accidents.  She is requesting oxycodone for pain.   Still high risk for falling. Very weak all over. Will be starting PT today.   Very short of breath with exertion as well. States she has COPD / emphysema that hasn't been treated. Was able to use oxygen at one point and stated it was very helpful for her.   Observations/Objective:  SpO2 with rest on room air: 95% SpO2 with exertion on room air: 88% No oxygen available at home at this time  Gen: Awake, alert, no acute distress Resp: Breathing is even and non-labored Psych: calm/pleasant demeanor, tearful and depressed at times  Neuro: Alert and Oriented x 3, + facial symmetry, speech is clear.   Assessment and Plan:  Problem List Items Addressed This Visit       Respiratory   Emphysema lung (HCC)    Prior note from me suggested referral to pulmonology; pt did not follow through on this. Will start her on Breztri at this time. Pt aware of risks vs benefits and possible adverse reactions. I will also order oxygen based on her SpO2 readings in objective findings.        Relevant Medications   Budeson-Glycopyrrol-Formoterol (BREZTRI AEROSPHERE) 160-9-4.8 MCG/ACT AERO   Other Relevant Orders   For home use only DME oxygen     Digestive   Anal cancer (HCC) - Primary   Relevant Orders   For home use only DME Other see comment     Other   Chronic pain syndrome, on Neurontin    Chronic, worsening Currently taking gabapentin 300 mg BID Pt requesting oxycodone-acetaminophen as she had in the past; informed her I would collaborate with her oncologist on this; pt having more pain recently especially in abdomen and along R back / leg.  Hx lumbar compression and rib fractures also contributing.  Concerned about adding more medication due to her high fall risk      Incontinence of feces with fecal urgency    Secondary to current anal cancer and treatments. Will order for home diapers and bedside commode.       Relevant Orders   For home use only DME Other see comment   Other Visit Diagnoses     Diarrhea, unspecified type       Relevant Orders   For home use only DME Other see comment        Follow Up Instructions:    I discussed the assessment and treatment plan with the patient. The patient was provided an opportunity to ask questions and all were answered. The patient agreed with the plan and demonstrated an understanding of the instructions.   The patient was advised  to call back or seek an in-person evaluation if the symptoms worsen or if the condition fails to improve as anticipated.   M , PA-C

## 2022-10-27 NOTE — Telephone Encounter (Signed)
Patient schedule a Virtual appt today

## 2022-10-27 NOTE — Therapy (Signed)
OUTPATIENT PHYSICAL THERAPY BALANCE AND STRENGTH EVALUATION   [name] MRN: [id number] DOB:[birth year], 67 y.o., female Today's Date: 10/27/2022  END OF SESSION:  PT End of Session - 10/27/22 1301     Visit Number 1    Number of Visits 16    Date for PT Re-Evaluation 01/19/23    Authorization Type [employer]    PT Start Time 1302    PT Stop Time 1344    PT Time Calculation (min) 42 min    Equipment Utilized During Treatment Gait belt    Activity Tolerance Patient limited by fatigue;Patient limited by pain    Behavior During Therapy Anxious             Past Medical History:  Diagnosis Date   Arthritis    Breast cancer (HCC)    Cataract, left 09/29/2017   Chronic pain syndrome, on Neurontin 09/30/2017   Dry eye syndrome of both eyes, on Restasis 09/30/2017   Emphysema lung (HCC)    Family history of breast cancer    Family history of melanoma    Family history of ovarian cancer    Family history of prostate cancer    Family history of throat cancer    Frequent urinary tract infections    GAD (generalized anxiety disorder) 03/11/2015   Genital herpes    History of chicken pox    History of degenerative disc disease 10/27/2002   Kidney stone    Knee osteonecrosis, 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   Major depression, recurrent, chronic (HCC), followed by Psych, on Wellbutrin, Cymbalta 09/30/2017   Mixed hyperlipidemia, on Zetia and Zocor 09/30/2017   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   MVP (mitral valve prolapse) 03/13/2015   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   Nuclear sclerosis 06/10/2013   Panic disorder, followed by Psych, on Xanax 09/30/2017   Personal history of breast cancer    Psoriasis    PTSD (post-traumatic stress disorder) 03/11/2015   Stroke (HCC)    Total knee replacement status, 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   Vitamin D deficiency 09/30/2017   Past Surgical History:  Procedure Laterality Date   AUGMENTATION MAMMAPLASTY Bilateral     BREAST BIOPSY     KIDNEY STONE SURGERY  01/2022   MASTECTOMY Left 1995   has a tram flap   RECONSTRUCTION BREAST W/ TRAM FLAP Left 2003   Patient Active Problem List   Diagnosis Date Noted   Insomnia due to medical condition 05/26/2022   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   Age-related osteoporosis without current pathological fracture 03/10/2022   Neuropathy 01/28/2022   Positive ANA (antinuclear antibody) 01/28/2022   Recurrent falls 01/28/2022   Aortic atherosclerosis (HCC) 10/29/2020   Emphysema lung (HCC) 10/29/2020   Genetic testing 03/27/2018   Personal history of breast cancer    Bilateral lower extremity edema 02/03/2018   Epidermoid cyst of skin 02/03/2018   B12 deficiency 02/03/2018   Mixed hyperlipidemia, on Zetia and Zocor 09/30/2017   Chronic pain syndrome, on Neurontin 09/30/2017   Vitamin D deficiency 09/30/2017   Major depression, recurrent, chronic (HCC), followed by Psych, on Wellbutrin, Cymbalta 09/30/2017   Panic disorder, followed by Psych, on Xanax 09/30/2017   Dry eye syndrome of both eyes, on Restasis 09/30/2017   History of stroke, on ASA 09/30/2017   Cataract, left 09/29/2017   Knee osteonecrosis, 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   Chronic low back pain 05/24/2017   MVP (mitral valve prolapse) 03/13/2015   GAD (generalized anxiety disorder) 03/11/2015   PTSD (  post-traumatic stress disorder) 03/11/2015   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   Nuclear sclerosis 06/10/2013   History of degenerative disc disease 10/27/2002   History of brain tumor 09/01/1997    PCP: Allwardt, Crist Infante, PA-C  REFERRING PROVIDER: Allwardt, Crist Infante, PA-C  REFERRING DIAG: R53.1 (ICD-10-CM) - Weakness R29.6 (ICD-10-CM) - Recurrent falls C21.0 (ICD-10-CM) - Anal cancer (HCC)  THERAPY DIAG:  Muscle weakness (generalized)  Difficulty in walking, not elsewhere classified  Repeated falls  Unsteadiness on  feet  Rationale for Evaluation and Treatment: Rehabilitation  ONSET DATE: Earlier this year   SUBJECTIVE:   SUBJECTIVE STATEMENT: States she has always had pain in her hips but the last couple of weeks she has had more pain I he right hip.   States she has been having pain in her butt from the radiation and runny poop . States sh also has neuropathy in her left leg and has a history of sciatica.   States she has a walker but can't use it at home as her home is small. States that she only goes up the stairs to shower but currently is giving her self sponge baths. States when she has been getting treatments since the beginning of the year. States she is getting chemo 4 treatments in 3 months.   States she has been sitting  a lot because she weak and not feeling well.   PERTINENT HISTORY: Anal cancer (current), COPD, chronic pain syndrome, neuropathy toes on the right and foot and shin on the left. CVA with right sided weakness, difficulty PAIN:  Are you having pain? Yes: NPRS scale: real bad/10 Pain location: hips/legs/back  Pain description: constant achy Aggravating factors: sitting,  Relieving factors:  nothing  PRECAUTIONS: Fall  RED FLAGS: Current cancer treatments   WEIGHT BEARING RESTRICTIONS: No  FALLS:  Has patient fallen in last 6 months? Yes. Number of falls 4  LIVING ENVIRONMENT: Lives with: lives alone Lives in: House/apartment Stairs: Yes: Internal: 12 steps; can reach both Has following equipment at home: Dan Humphreys - 2 wheeled  OCCUPATION: retired  PLOF: Independent  PATIENT GOALS: to be able walk around better and have less falls    OBJECTIVE:  COGNITION: Overall cognitive status: Within functional limits for tasks assessed     SENSATION: Not tested    POSTURE: rounded shoulders, forward head, posterior pelvic tilt, and flexed trunk   PALPATION: Tenderness to palpation   SPO2  At Rest 97%   Standing 94  After walking 89%    LE  Measurements Lower Extremity Right EVAL Left EVAL   A/PROM MMT A/PROM MMT  Hip Flexion  3*  3*  Hip Extension      Hip Abduction (seated)  4-*  4-*  Hip Adduction (seated)  4-  4-  Hip Internal rotation      Hip External rotation      Knee Flexion  3+  3+  Knee Extension  4-  3+  Ankle Dorsiflexion  3+  3+  Ankle Plantarflexion      Ankle Inversion      Ankle Eversion       (Blank rows = not tested) * pain   FUNCTIONAL TESTS:  STS - uses hands - second attempt - light headed - slow labored movements fatigue  GAIT: Distance walked: 50 ft Assistive device utilized: Walker - 2 wheeled Level of assistance: SBA and CGA Comments: RW slow labored gait - short cadence  slumped  posture   TODAY'S TREATMENT:                                                                                                                              DATE:   10/27/2022  Therapeutic Exercise:  Aerobic: Supine: Prone:  Seated: LAQs x5 5" holds, hip add iso 5" holds x5, hip abd x5 5" holds  Standing: Neuromuscular Re-education: Manual Therapy: Therapeutic Activity: Self Care: Trigger Point Dry Needling:  Modalities:   PATIENT EDUCATION:  Education details: on current presentation, on HEP, on clinical outcomes score and POC, on benefits of grab bars in bathroom/hallway, on life alert Person educated: Patient Education method: Explanation, Demonstration, and Handouts Education comprehension: verbalized understanding  HOME EXERCISE PROGRAM: [id number]  ASSESSMENT:  CLINICAL IMPRESSION: Patient presents to therapy with complaints of weakness, pain and balance deficits with a history of multiple falls in the last 6 months. Session focused on education and review of current presentation as well as addressing risks at home that increase risk of falling. Patient currently under going anal cancer treatment including chemotherapy and radiation that is going to greatly affect progress and overall PT  treatment. Patient would greatly benefit from skilled physical therapy to reduce risk of falling and improve overall function and quality of life.    OBJECTIVE IMPAIRMENTS: decreased activity tolerance, decreased balance, decreased endurance, decreased knowledge of use of DME, decreased mobility, difficulty walking, decreased ROM, decreased strength, improper body mechanics, postural dysfunction, and pain.   ACTIVITY LIMITATIONS: lifting, sitting, standing, squatting, sleeping, stairs, transfers, bed mobility, dressing, and locomotion level  PARTICIPATION LIMITATIONS: meal prep, cleaning, and community activity  PERSONAL FACTORS: Fitness and 1-2 comorbidities: cancer, on radiation and chemo treatment  are also affecting patient's functional outcome.   REHAB POTENTIAL: Good  CLINICAL DECISION MAKING: Evolving/moderate complexity  EVALUATION COMPLEXITY: Moderate   GOALS: GOALS: Goals reviewed with patient? yes  SHORT TERM GOALS: Target date: 12/08/2022  Patient will be independent in self management strategies to improve quality of life and functional outcomes. Baseline: New Program Goal status: INITIAL  2.  Patient will report at least 25% improvement in overall symptoms and/or function to demonstrate improved functional mobility Baseline: 0% better Goal status: INITIAL  3.  Patient will be able to perform 5 STS from standard height chair to demonstrate improved LE endurance Baseline: unable Goal status: INITIAL  4.  Patient will report standing up for at least 2 minutes every hour to demonstrate improved functional endurance. Baseline: unable Goal status: INITIAL    LONG TERM GOALS: Target date: 01/19/2023    Patient will report at least 50% improvement in overall symptoms and/or function to demonstrate improved functional mobility Baseline: 0% better Goal status: INITIAL  2.  Patient will be able walk for 5 minutes with SBA and RW to demonstrate improved functional  endurance.  Baseline:unable Goal status: INITIAL  3.  Patient will demonstrate at least 4-/5 MMT in seated position in lower legs to  demonstrate improved LE strengthening.  Baseline:  Goal status: INITIAL   PLAN:  PT FREQUENCY: 1-2x/week for a total of 16 visits   PT DURATION: 12 weeks  PLANNED INTERVENTIONS: Therapeutic exercises, Therapeutic activity, Neuromuscular re-education, Balance training, Gait training, Patient/Family education, Self Care, Joint mobilization, Joint manipulation, Stair training, Vestibular training, Canalith repositioning, Orthotic/Fit training, Prosthetic training, DME instructions, Aquatic Therapy, Dry Needling, Electrical stimulation, Spinal manipulation, Spinal mobilization, Cryotherapy, Moist heat, Taping, Traction, Ultrasound, Ionotophoresis 4mg /ml Dexamethasone, Manual therapy, and Re-evaluation.   PLAN FOR NEXT SESSION: balance and functional endurance, seated strengthening. STS    3:04 PM, 10/27/22 Tereasa Coop, DPT Physical Therapy with [facility]

## 2022-10-28 ENCOUNTER — Other Ambulatory Visit (HOSPITAL_COMMUNITY): Payer: Self-pay | Admitting: Student

## 2022-10-28 ENCOUNTER — Encounter: Admit: 2022-10-28 | Payer: PRIVATE HEALTH INSURANCE | Primary: Internal Medicine

## 2022-10-28 ENCOUNTER — Telehealth: Admit: 2022-10-28 | Payer: PRIVATE HEALTH INSURANCE | Attending: Internal Medicine | Primary: Internal Medicine

## 2022-10-28 ENCOUNTER — Encounter: Admit: 2022-10-28 | Payer: PRIVATE HEALTH INSURANCE | Attending: Radiation Oncology | Primary: Internal Medicine

## 2022-10-28 ENCOUNTER — Inpatient Hospital Stay: Admit: 2022-10-28 | Discharge: 2022-10-28 | Payer: BLUE CROSS/BLUE SHIELD | Primary: Internal Medicine

## 2022-10-28 ENCOUNTER — Ambulatory Visit: Admit: 2022-10-28 | Payer: BLUE CROSS/BLUE SHIELD | Primary: Internal Medicine

## 2022-10-28 DIAGNOSIS — J449 Chronic obstructive pulmonary disease, unspecified: Secondary | ICD-10-CM

## 2022-10-28 DIAGNOSIS — Z720 Tobacco use: Secondary | ICD-10-CM

## 2022-10-28 DIAGNOSIS — C3490 Malignant neoplasm of unspecified part of unspecified bronchus or lung: Secondary | ICD-10-CM

## 2022-10-28 DIAGNOSIS — R451 Restlessness and agitation: Secondary | ICD-10-CM

## 2022-10-28 DIAGNOSIS — B1920 Unspecified viral hepatitis C without hepatic coma: Secondary | ICD-10-CM

## 2022-10-28 DIAGNOSIS — Z510 Encounter for antineoplastic radiation therapy: Secondary | ICD-10-CM

## 2022-10-28 DIAGNOSIS — K529 Noninfective gastroenteritis and colitis, unspecified: Secondary | ICD-10-CM

## 2022-10-28 MED ORDER — GADOTERATE MEGLUMINE 0.5 MMOL/ML (376.9 MG/ML) INTRAVENOUS SOLUTION
0.5 | Freq: Once | INTRAVENOUS | Status: CP | PRN
Start: 2022-10-28 — End: ?
  Administered 2022-10-28: 15:00:00 0.5 mL via INTRAVENOUS

## 2022-10-28 MED FILL — Dexamethasone Sodium Phosphate Inj 100 MG/10ML: INTRAMUSCULAR | Qty: 1 | Status: AC

## 2022-10-28 MED FILL — Fosaprepitant Dimeglumine For IV Infusion 150 MG (Base Eq): INTRAVENOUS | Qty: 5 | Status: AC

## 2022-10-28 NOTE — Progress Notes
ON TREATMENT VISIT NOTE Diagnosis: NSCLCSite: RUL LungCurrent RT dose: 6000 cGyPlanned RT Dose: 6600 cGyFx: 30Nursing Assessment: Verbal and written discharge instructions provided. Pt demonstrated understanding.Patient with no pulmonary concerns. No back pain. PE:Wt 41.9 kg  - SpO2 97%  - BMI 17.17 kg/m?  Wt Readings from Last 3 Encounters: 10/28/22 41.9 kg 10/24/22 42.9 kg 10/21/22 41.3 kg   Thin appearing. In NAD. Karnofsky Score: Normal activity with effort,Karnofsky Score: 80Data Review:No labs ordered today. ASSESSMENT AND PLAN:Tolerated RT with expected fatigue. MRI brain reviewed and discussed with them there were no signs of metastases or other acute causes of memory issues. I had a long conversation today with the patient and her husband about our rationale for [facility] clinic referral and attempts to offer support for her and her husband. She perseverates on difficulties getting to those visits. I discussed that she has received a curative intent dose of RT (60Gy in 30 fractions). Given her difficulties with chemo and RT until now I have recommended stopping RT at 60Gy rather than attempting a boost to 66Gy. I will see them in 2 weeks for f/u.  Completed 60Gy. Will omit boost as per above. Marland Kitchen.A review of treatment set-up, dosimetry, and portal images has been performed.

## 2022-10-28 NOTE — Patient Instructions
Discharge Instructions after Radiation Therapy  Department of Radiation Oncology   Side effects after treatments are completeYou may experience the side effects from your radiation treatments for 2-4 weeks or more after you have finished your last treatment.If you do have side effects, they will improve slowly in the weeks following the end of your treatments.Please follow the instructions given by your nurse or doctor for ways to ease any discomfort during your recovery period.Do not hesitate to call this department with any radiation-related concerns.General care for recovery after treatmentGet plenty of rest.Eat a healthy diet. If you made changes to your diet during radiation treatments, go back to your regular foods gradually and only when you are ready.Continue taking all of your regular prescribed medications unless your doctor gives you different instructions.The skin in the area where you received radiation is now more sensitive to the sun than before radiation. For one year after you received radiation, you should protect this area from direct sun exposure and use a sunscreen with SPF 30 or higher.  Other:_________________________________________________________________ ____________________________________________________________________________________________________________________________________________Follow up appointmentsAsk your radiation oncology doctor when to return for a follow-up visit. The radiation oncology doctor will monitor your response to the radiation therapy and your recovery from its side effects. For general health care you should return to your primary doctor.Important contact informationEmergencies: 911Department of Radiation Oncology: [phone]: [phone]: [email or web address] [place]: [phone]: [phone]: [phone]: [phone]: [phone] Doctor:  ____________________________________________________________Information given by: Marisue Humble, RNDate: 8/2/2024I have received a copy of this information: _________________________________________

## 2022-10-29 ENCOUNTER — Encounter: Admit: 2022-10-29 | Payer: PRIVATE HEALTH INSURANCE | Primary: Internal Medicine

## 2022-10-30 ENCOUNTER — Other Ambulatory Visit: Payer: Self-pay | Admitting: Oncology

## 2022-10-30 ENCOUNTER — Other Ambulatory Visit: Payer: Self-pay | Admitting: Student

## 2022-10-30 DIAGNOSIS — R152 Fecal urgency: Secondary | ICD-10-CM | POA: Insufficient documentation

## 2022-10-30 NOTE — Assessment & Plan Note (Signed)
Prior note from me suggested referral to pulmonology; pt did not follow through on this. Will start her on Breztri at this time. Pt aware of risks vs benefits and possible adverse reactions. I will also order oxygen based on her SpO2 readings in objective findings.

## 2022-10-30 NOTE — Assessment & Plan Note (Signed)
Secondary to current anal cancer and treatments. Will order for home diapers and bedside commode.

## 2022-10-30 NOTE — H&P (Signed)
Chief Complaint: Chemotherapy access. Request is for portacath placement  Referring Physician(s): Rana Snare  Supervising Physician: Richarda Overlie  Patient Status: [facility] - Out-pt  History of Present Illness: Alyssa Deleon is a 67 y.o. female outpatient. Known to IR from liver biopsy and PICC placements.  Remote history of left breast cancer. 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 Team is requesting portacath for chemotherapy access.   Patient alert and sitting up in bed,calm. Endorses diarrhea. Denies any fevers, headache, chest pain, SOB, cough, abdominal pain, nausea, vomiting or bleeding. Return precautions and treatment recommendations and follow-up discussed with the patient  who is agreeable with the plan. Return precautions and treatment recommendations and follow-up discussed with the patient  who is agreeable with the plan.    Past Medical History:  Diagnosis Date   Arthritis    Breast cancer (HCC)    Cataract, left 09/29/2017   Chronic pain syndrome, on Neurontin 09/30/2017   Dry eye syndrome of both eyes, on Restasis 09/30/2017   Emphysema lung (HCC)    Family history of breast cancer    Family history of melanoma    Family history of ovarian cancer    Family history of prostate cancer    Family history of throat cancer    Frequent urinary tract infections    GAD (generalized anxiety disorder) 03/11/2015   Genital herpes    History of chicken pox    History of degenerative disc disease 10/27/2002   Kidney stone    Knee osteonecrosis, 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   Major depression, recurrent, chronic (HCC), followed by Psych, on Wellbutrin, Cymbalta 09/30/2017   Mixed hyperlipidemia, on Zetia and Zocor 09/30/2017   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   MVP (mitral valve prolapse) 03/13/2015   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   Nuclear sclerosis 06/10/2013   Panic disorder,  followed by Psych, on Xanax 09/30/2017   Personal history of breast cancer    Psoriasis    PTSD (post-traumatic stress disorder) 03/11/2015   Stroke (HCC)    Total knee replacement status, 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 Authorizing Provider  ALPRAZolam (XANAX) 1 MG tablet TAKE 2 TABLETS BY MOUTH EVERY  NIGHT AT BEDTIME AS NEEDED FOR  ANXIETY 09/20/22   Allwardt, Crist Infante, PA-C  Budeson-Glycopyrrol-Formoterol (BREZTRI AEROSPHERE) 160-9-4.8 MCG/ACT AERO Inhale 2 puffs into the lungs in the morning and at bedtime. Rinse mouth after use.  DAILY USE FOR COPD MAINTENANCE TREATMENT. 10/27/22   Allwardt, Arlyss Repress M, PA-C  buPROPion (WELLBUTRIN XL) 300 MG 24 hr tablet TAKE 1 TABLET BY MOUTH DAILY 03/18/22   Allwardt, Crist Infante, PA-C  Calcium Carbonate-Vitamin D 600-5 MG-MCG CAPS Take by mouth.    [provider]  denosumab (PROLIA) 60 MG/ML SOSY injection Inject 60 mg into the skin every 6 (six) months.    [provider]  diphenoxylate-atropine (LOMOTIL) 2.5-0.025 MG tablet Take 1 tablet by mouth 4 (  four) times daily as needed for diarrhea or loose stools. 07/11/22   Ronny Bacon, PA-C  DULoxetine (CYMBALTA) 60 MG capsule TAKE 1 CAPSULE BY MOUTH DAILY 03/18/22   Allwardt, Crist Infante, PA-C  ezetimibe (ZETIA) 10 MG tablet Take 1 tablet (10 mg total) by mouth daily. 04/21/22   Allwardt, Crist Infante, PA-C  gabapentin (NEURONTIN) 300 MG  capsule TAKE 1 CAPSULE BY MOUTH TWICE  DAILY 10/10/22   Allwardt, Alyssa M, PA-C  loperamide (IMODIUM A-D) 2 MG tablet Take 2 mg by mouth 4 (four) times daily.    [provider]  LORazepam (ATIVAN) 0.5 MG tablet Take 1 tablet by mouth or dissolve q 4-6 hours as needed for nausea if Zofran/Compazine are not working. Do not take Xanax within 4 hours. 07/11/22   Ronny Bacon, PA-C  magic mouthwash (nystatin, diphenhydrAMINE, alum & mag hydroxide) suspension mixture Swish and spit 5 mL by mouth as needed for mouth pain. 06/06/22     magic mouthwash (nystatin, diphenhydrAMINE, alum & mag hydroxide) suspension mixture Swish and spit 5 mLs by mouth 4 (four) times daily as needed. 10/27/22     ondansetron (ZOFRAN) 8 MG tablet Take 1 tablet (8 mg total) by mouth every 8 (eight) hours as needed for nausea or vomiting. 06/03/22   Ladene Artist, MD  prochlorperazine (COMPAZINE) 10 MG tablet Take 1 tablet (10 mg total) by mouth every 6 (six) hours as needed for nausea or vomiting. 06/03/22   Ladene Artist, MD  simvastatin (ZOCOR) 40 MG tablet TAKE 1 TABLET BY MOUTH DAILY 01/24/22   Allwardt, Alyssa M, PA-C  traZODone (DESYREL) 150 MG tablet Take 1 tablet (150 mg total) by mouth at bedtime. 09/14/22   Allwardt, Alyssa M, PA-C  TYRVAYA 0.03 MG/ACT SOLN Place 1 spray into both nostrils 2 (two) times daily. 02/22/22   [provider]     Family History  Problem Relation Age of Onset   Cancer - Other Mother    Miscarriages / [place] Mother    Alzheimer's disease Mother    Breast cancer Mother    Skin cancer Mother    Cancer - Prostate Father        metastatic   Hyperlipidemia Father    Throat cancer Father    Cancer - Other Sister    Depression Sister    Hearing loss Sister    Hyperlipidemia Sister    Breast cancer Sister        both breasts   Skin cancer Sister    Cancer - Other Sister    Breast cancer Sister 80   Cancer - Other Brother    Throat cancer Brother    Breast  cancer Paternal Aunt    Prostate cancer Paternal Uncle    Breast cancer Maternal Grandmother    Alzheimer's disease Maternal Grandfather    Ovarian cancer Paternal Grandmother    Heart attack Paternal Grandfather    Breast cancer Other        both breasts dx 31   Liver disease Neg Hx    Colon cancer Neg Hx    Esophageal cancer Neg Hx    Rectal cancer Neg Hx    Stomach cancer Neg Hx     Social History   Socioeconomic History   Marital status: Divorced    Spouse name: Not on file   Number of children: 2   Years of education: Not on file   Highest education level: GED or equivalent  Occupational History  Occupation: Retired/ disabilty  Tobacco Use   Smoking status: Former    Current packs/day: 0.00    Average packs/day: 0.8 packs/day for 42.0 years (31.5 ttl pk-yrs)    Types: Cigarettes    Start date: 11/07/1978    Quit date: 11/06/2020    Years since quitting: 1.9    Passive exposure: Past   Smokeless tobacco: Never  Vaping Use   Vaping status: Never Used  Substance and Sexual Activity   Alcohol use: Yes    Comment: Special Occasions   Drug use: Never   Sexual activity: Not Currently  Other Topics Concern   Not on file  Social History Narrative   Quit smoking in 11/06/2020       Right Handed    Lives in a town home with 14 stairs.    Social Determinants of Health   Financial Resource Strain: Medium Risk (09/10/2022)   Overall Financial Resource Strain (CARDIA)    Difficulty of Paying Living Expenses: Somewhat hard  Food Insecurity: Food Insecurity Present (09/10/2022)   Hunger Vital Sign    Worried About Running Out of Food in the Last Year: Sometimes true    Ran Out of Food in the Last Year: Sometimes true  Transportation Needs: Unmet Transportation Needs (09/10/2022)   PRAPARE - Administrator, Civil Service (Medical): Yes    Lack of Transportation (Non-Medical): No  Physical Activity: Unknown (09/10/2022)   Exercise Vital Sign    Days of Exercise  per Week: 0 days    Minutes of Exercise per Session: Not on file  Stress: Stress Concern Present (09/10/2022)   [employer] of Occupational Health - Occupational Stress Questionnaire    Feeling of Stress : Rather much  Social Connections: Socially Isolated (09/10/2022)   Social Connection and Isolation Panel [NHANES]    Frequency of Communication with Friends and Family: Once a week    Frequency of Social Gatherings with Friends and Family: Once a week    Attends Religious Services: Never    Database administrator or Organizations: No    Attends Engineer, structural: Not on file    Marital Status: Divorced     Review of Systems: A 12 point ROS discussed and pertinent positives are indicated in the HPI above.  All other systems are negative.  Review of Systems  Constitutional:  Negative for fatigue and fever.  HENT:  Negative for congestion.   Respiratory:  Negative for cough and shortness of breath.   Gastrointestinal:  Positive for diarrhea. Negative for abdominal pain, nausea and vomiting.    Vital Signs: Temp 98.1. Team 98/67, HR 100, rr 18  Advance Care Plan: The advanced care plan/surrogate decision maker was discussed at the time of visit and documented in the medical record.  daughter   Physical Exam Vitals and nursing note reviewed.  Constitutional:      Appearance: Normal appearance. She is well-developed.  HENT:     Head: Normocephalic and atraumatic.  Eyes:     Conjunctiva/sclera: Conjunctivae normal.  Cardiovascular:     Rate and Rhythm: Tachycardia present.  Pulmonary:     Effort: Pulmonary effort is normal.  Musculoskeletal:        General: Normal range of motion.     Cervical back: Normal range of motion.  Skin:    General: Skin is warm and dry.  Neurological:     General: No focal deficit present.     Mental Status: She is alert and oriented  to person, place, and time.  Psychiatric:        Mood and Affect: Mood normal.        Behavior:  Behavior normal.        Thought Content: Thought content normal.        Judgment: Judgment normal.     Imaging: No results found.  Labs:  CBC: Recent Labs    07/18/22 0825 08/16/22 0937 09/09/22 1742 09/30/22 1148  WBC 2.6* 4.8 5.2 4.3  HGB 11.2* 12.4 10.3* 10.4*  HCT 33.2* 37.3 31.5* 32.6*  PLT 182 206 208 183    COAGS: Recent Labs    09/30/22 1148  INR 1.1    BMP: Recent Labs    07/18/22 0825 09/06/22 1022 09/09/22 1742 09/30/22 1148  NA 142 140 139 137  K 4.3 4.9 4.0 3.7  CL 105 104 104 104  CO2 27 29 25 25   GLUCOSE 94 107* 92 111*  BUN 9 11 13 10   CALCIUM 9.0 9.4 9.3 8.5*  CREATININE 0.63 0.86 0.83 0.80  GFRNONAA >60 >60 >60 >60    LIVER FUNCTION TESTS: Recent Labs    07/04/22 0859 07/18/22 0825 09/06/22 1022 09/30/22 1148  BILITOT 0.7 0.7 0.6 0.6  AST 10* 12* 9* 13*  ALT 6 8 <5 6  ALKPHOS 52 46 58 61  PROT 6.3* 6.6 6.4* 6.7  ALBUMIN 4.2 4.0 4.0 3.6     Assessment and Plan:  67 y.o. female outpatient. Known to IR from liver biopsy and PICC placements.  Remote history of left breast cancer. 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 Team is requesting portacath for chemotherapy access.   No line history per EPIC. PET scan from 7.3.24 shows RIJ is accessible. No recent labs. All medications are within acceptable parameters. Allergies include morphine and Vicodin. Patient has been NPO since midnight.   Risks and benefits of image guided port-a-catheter placement was discussed with the patient including, but not limited to bleeding, infection, pneumothorax, or fibrin sheath development and need for additional procedures.  All of the patient's questions were answered, patient is agreeable to proceed. Consent signed and in chart.   Thank you for this interesting consult.  I greatly enjoyed meeting VINIA JEMMOTT and look forward to participating in their care.  A copy of this report was  sent to the requesting provider on this date.  Electronically Signed: Alene Mires, NP 10/31/2022, 10:55 AM   I spent a total of  30 Minutes in face to face in clinical consultation, greater than 50% of which was counseling/coordinating care for portacath placement

## 2022-10-30 NOTE — Assessment & Plan Note (Signed)
Chronic, worsening Currently taking gabapentin 300 mg BID Pt requesting oxycodone-acetaminophen as she had in the past; informed her I would collaborate with her oncologist on this; pt having more pain recently especially in abdomen and along R back / leg.  Hx lumbar compression and rib fractures also contributing.  Concerned about adding more medication due to her high fall risk

## 2022-10-31 ENCOUNTER — Other Ambulatory Visit: Payer: Self-pay | Admitting: *Deleted

## 2022-10-31 ENCOUNTER — Ambulatory Visit (HOSPITAL_COMMUNITY)
Admission: RE | Admit: 2022-10-31 | Discharge: 2022-10-31 | Disposition: A | Discharge status: Home / Self Care | Payer: Medicare Other | Source: Ambulatory Visit | Attending: Nurse Practitioner | Admitting: Nurse Practitioner

## 2022-10-31 ENCOUNTER — Encounter: Payer: Self-pay | Admitting: *Deleted

## 2022-10-31 ENCOUNTER — Other Ambulatory Visit: Payer: Self-pay

## 2022-10-31 ENCOUNTER — Other Ambulatory Visit: Payer: Self-pay | Admitting: Oncology

## 2022-10-31 ENCOUNTER — Ambulatory Visit: Admit: 2022-10-31 | Payer: BLUE CROSS/BLUE SHIELD | Primary: Internal Medicine

## 2022-10-31 DIAGNOSIS — Z510 Encounter for antineoplastic radiation therapy: Secondary | ICD-10-CM

## 2022-10-31 DIAGNOSIS — E785 Hyperlipidemia, unspecified: Secondary | ICD-10-CM | POA: Insufficient documentation

## 2022-10-31 DIAGNOSIS — C787 Secondary malignant neoplasm of liver and intrahepatic bile duct: Secondary | ICD-10-CM | POA: Diagnosis not present

## 2022-10-31 DIAGNOSIS — F4310 Post-traumatic stress disorder, unspecified: Secondary | ICD-10-CM | POA: Insufficient documentation

## 2022-10-31 DIAGNOSIS — Z87891 Personal history of nicotine dependence: Secondary | ICD-10-CM | POA: Diagnosis not present

## 2022-10-31 DIAGNOSIS — C210 Malignant neoplasm of anus, unspecified: Secondary | ICD-10-CM | POA: Diagnosis not present

## 2022-10-31 DIAGNOSIS — Z8673 Personal history of transient ischemic attack (TIA), and cerebral infarction without residual deficits: Secondary | ICD-10-CM | POA: Insufficient documentation

## 2022-10-31 DIAGNOSIS — Z853 Personal history of malignant neoplasm of breast: Secondary | ICD-10-CM | POA: Diagnosis not present

## 2022-10-31 HISTORY — PX: IR IMAGING GUIDED PORT INSERTION: IMG5740

## 2022-10-31 MED ORDER — MIDAZOLAM HCL 2 MG/2ML IJ SOLN
INTRAMUSCULAR | Status: AC | PRN
  Administered 2022-10-31 (×2): 1 mg via INTRAVENOUS

## 2022-10-31 MED ORDER — HEPARIN SOD (PORK) LOCK FLUSH 100 UNIT/ML IV SOLN
INTRAVENOUS | Status: AC
  Filled 2022-10-31: qty 5

## 2022-10-31 MED ORDER — FENTANYL CITRATE (PF) 100 MCG/2ML IJ SOLN
INTRAMUSCULAR | Status: AC
  Filled 2022-10-31: qty 4

## 2022-10-31 MED ORDER — FENTANYL CITRATE (PF) 100 MCG/2ML IJ SOLN
INTRAMUSCULAR | Status: AC | PRN
Start: 2022-10-31 — End: 2022-10-31
  Administered 2022-10-31 (×2): 50 ug via INTRAVENOUS

## 2022-10-31 MED ORDER — MIDAZOLAM HCL 2 MG/2ML IJ SOLN
INTRAMUSCULAR | Status: AC
  Filled 2022-10-31: qty 4

## 2022-10-31 MED ORDER — LIDOCAINE-EPINEPHRINE 1 %-1:100000 IJ SOLN
INTRAMUSCULAR | Status: AC
  Filled 2022-10-31: qty 1

## 2022-10-31 MED ORDER — SODIUM CHLORIDE 0.9 % IV SOLN: INTRAVENOUS | Status: DC

## 2022-10-31 NOTE — Procedures (Signed)
Interventional Radiology Procedure Note ° °Procedure: Single Lumen Power Port Placement   ° °Access:  Right internal jugular vein ° °Findings: Catheter tip positioned at cavoatrial junction. Port is ready for immediate use.  ° °Complications: None ° °EBL: < 10 mL ° °Recommendations:  °- Ok to shower in 24 hours °- Do not submerge for 7 days °- Routine line care  ° ° ° , MD ° ° ° °

## 2022-10-31 NOTE — Progress Notes (Signed)
Insurance has denied her Udenyca injection. Per Dr. Truett Perna: Cancel injection and check nadir CBC on day 9. Scheduling message sent.

## 2022-11-01 ENCOUNTER — Other Ambulatory Visit (HOSPITAL_BASED_OUTPATIENT_CLINIC_OR_DEPARTMENT_OTHER): Payer: Self-pay

## 2022-11-01 ENCOUNTER — Ambulatory Visit: Admit: 2022-11-01 | Payer: BLUE CROSS/BLUE SHIELD | Primary: Internal Medicine

## 2022-11-01 ENCOUNTER — Encounter: Admit: 2022-11-01 | Payer: PRIVATE HEALTH INSURANCE | Primary: Internal Medicine

## 2022-11-01 ENCOUNTER — Ambulatory Visit: Admit: 2022-11-01 | Payer: BLUE CROSS/BLUE SHIELD | Attending: Radiation Oncology | Primary: Internal Medicine

## 2022-11-01 DIAGNOSIS — Z510 Encounter for antineoplastic radiation therapy: Secondary | ICD-10-CM

## 2022-11-01 NOTE — Progress Notes
COMMUNITY HEALTH WORKER PROGRAMHEALTH EQUITY AND COMMUNITY IMPACTPatient Information: Courtney Knox is a 67 y.o. Not Hispanic Or Latino/A/E female, whose preferred language is [place].  Narrative: Reason for call: CHW attempted to contact patient to complete an SDOH initial intake. CHW left a voicemail message. Summary/Next steps: Follow up:in 3 - 4 days Outcome: SDOH Encounter: Left Message/VoicemailSigned: Kandace Blitz, 11/01/2022

## 2022-11-01 NOTE — Progress Notes
COMMUNITY HEALTH WORKER PROGRAMHEALTH EQUITY AND COMMUNITY IMPACTPatient Information: Courtney Knox is a 67 y.o. Not Hispanic Or Latino/A/E female, whose preferred language is [place].  Narrative: Reason for call: Patient returned CHW phone call. Patient stated she is all set and doesn't need any resources at this time.  Summary/Next steps: Follow ZD:GLOVFIEPPIR: SDOH Encounter: ContactedSigned: Kandace Blitz, 11/01/2022

## 2022-11-02 ENCOUNTER — Telehealth: Payer: Self-pay | Admitting: *Deleted

## 2022-11-02 ENCOUNTER — Encounter: Payer: Self-pay | Admitting: Oncology

## 2022-11-02 ENCOUNTER — Encounter: Payer: Medicare Other | Admitting: Physical Therapy

## 2022-11-02 ENCOUNTER — Inpatient Hospital Stay: Payer: Medicare Other | Attending: Oncology | Admitting: Nutrition

## 2022-11-02 ENCOUNTER — Telehealth: Admit: 2022-11-02 | Payer: PRIVATE HEALTH INSURANCE | Attending: Internal Medicine | Primary: Internal Medicine

## 2022-11-02 ENCOUNTER — Ambulatory Visit: Admit: 2022-11-02 | Payer: BLUE CROSS/BLUE SHIELD | Primary: Internal Medicine

## 2022-11-02 DIAGNOSIS — C787 Secondary malignant neoplasm of liver and intrahepatic bile duct: Secondary | ICD-10-CM | POA: Insufficient documentation

## 2022-11-02 DIAGNOSIS — C210 Malignant neoplasm of anus, unspecified: Secondary | ICD-10-CM | POA: Insufficient documentation

## 2022-11-02 DIAGNOSIS — E860 Dehydration: Secondary | ICD-10-CM | POA: Insufficient documentation

## 2022-11-02 DIAGNOSIS — Z5111 Encounter for antineoplastic chemotherapy: Secondary | ICD-10-CM | POA: Insufficient documentation

## 2022-11-02 DIAGNOSIS — Z510 Encounter for antineoplastic radiation therapy: Secondary | ICD-10-CM

## 2022-11-02 NOTE — Progress Notes (Signed)
Telephone visit completed with patient and daughter.  67 year old female diagnosed with met Anal cancer and followed by Dr. Truett Perna. Patient received 1st cycle of Taxol/carbo on Aug 8.  PMH includes Depression, L Breast cancer, DDD, PTSD, CVA.  Medications include Xanax, Wellbutrin, Calcium/Vit D, Decadron, Lomotil, Imodium AD, Ativan, MMW, Zofran and Compazine.  Labs reviewed.  Height: 5'7". Weight: 148 pounds. UBW: 182 pounds March 2024.  Patient endorsed diarrhea and decreased appetite. She is weak. She is having increased pain and does not have any pain medications. Requesting prescription.   Nutrition Diagnosis: Unintended wt loss related to cancer and associated treatments as evidenced by 19% wt loss in less than 6 mo which is clinically significant.  Intervention: Educated to eat small, frequent meals and snacks throughout the day. Educated on low fiber diet. Contacted RN regarding patient request for pain medication. Questions answered  Monitoring, Evaluation, Goals: Patient will tolerate low fiber diet to improve diarrhea and promote wt maintenance/minimize wt loss.  Next Visit: Wednesday, Aug 28, during infusion.

## 2022-11-02 NOTE — Telephone Encounter (Addendum)
Per Dr. Truett Perna: He will not prescribe oxycodone/apap. Agrees to Ultracet prn. Spoke w/pharmacy and they are no longer able to obtain this medication.

## 2022-11-02 NOTE — Telephone Encounter (Signed)
Alyssa Deleon is reporting "severe stabbing pain" in rectal/anal region that has not responded to OTC ES Tylenol or Ibuprofen prn or topical cream. She is also reporting "pain all over" and requests oxycodone.  She will be in office tomorrow for her treatment.

## 2022-11-03 ENCOUNTER — Other Ambulatory Visit (HOSPITAL_BASED_OUTPATIENT_CLINIC_OR_DEPARTMENT_OTHER): Payer: Self-pay

## 2022-11-03 ENCOUNTER — Other Ambulatory Visit: Payer: Self-pay | Admitting: *Deleted

## 2022-11-03 ENCOUNTER — Other Ambulatory Visit: Payer: Self-pay | Admitting: Oncology

## 2022-11-03 ENCOUNTER — Inpatient Hospital Stay: Payer: Medicare Other

## 2022-11-03 ENCOUNTER — Encounter: Payer: Self-pay | Admitting: Oncology

## 2022-11-03 VITALS — BP 128/72 | HR 60 | Temp 98.2°F | Resp 18 | Ht 67.0 in | Wt 148.1 lb

## 2022-11-03 DIAGNOSIS — C210 Malignant neoplasm of anus, unspecified: Secondary | ICD-10-CM | POA: Diagnosis present

## 2022-11-03 DIAGNOSIS — E860 Dehydration: Secondary | ICD-10-CM | POA: Diagnosis present

## 2022-11-03 DIAGNOSIS — Z5111 Encounter for antineoplastic chemotherapy: Secondary | ICD-10-CM | POA: Diagnosis present

## 2022-11-03 DIAGNOSIS — C787 Secondary malignant neoplasm of liver and intrahepatic bile duct: Secondary | ICD-10-CM | POA: Diagnosis not present

## 2022-11-03 LAB — CBC WITH DIFFERENTIAL (CANCER CENTER ONLY)
Abs Immature Granulocytes: 0.02 10*3/uL (ref 0.00–0.07)
Basophils Absolute: 0 10*3/uL (ref 0.0–0.1)
Basophils Relative: 0 %
Eosinophils Absolute: 0 10*3/uL (ref 0.0–0.5)
Eosinophils Relative: 0 %
HCT: 30.8 % — ABNORMAL LOW (ref 36.0–46.0)
Hemoglobin: 9.9 g/dL — ABNORMAL LOW (ref 12.0–15.0)
Immature Granulocytes: 1 %
Lymphocytes Relative: 5 %
Lymphs Abs: 0.2 10*3/uL — ABNORMAL LOW (ref 0.7–4.0)
MCH: 31.6 pg (ref 26.0–34.0)
MCHC: 32.1 g/dL (ref 30.0–36.0)
MCV: 98.4 fL (ref 80.0–100.0)
Monocytes Absolute: 0.1 10*3/uL (ref 0.1–1.0)
Monocytes Relative: 2 %
Neutro Abs: 3.5 10*3/uL (ref 1.7–7.7)
Neutrophils Relative %: 92 %
Platelet Count: 241 10*3/uL (ref 150–400)
RBC: 3.13 MIL/uL — ABNORMAL LOW (ref 3.87–5.11)
RDW: 12.6 % (ref 11.5–15.5)
WBC Count: 3.8 10*3/uL — ABNORMAL LOW (ref 4.0–10.5)
nRBC: 0 % (ref 0.0–0.2)

## 2022-11-03 LAB — CMP (CANCER CENTER ONLY)
ALT: 5 U/L (ref 0–44)
AST: 9 U/L — ABNORMAL LOW (ref 15–41)
Albumin: 3.7 g/dL (ref 3.5–5.0)
Alkaline Phosphatase: 58 U/L (ref 38–126)
Anion gap: 10 (ref 5–15)
BUN: 12 mg/dL (ref 8–23)
CO2: 28 mmol/L (ref 22–32)
Calcium: 8.7 mg/dL — ABNORMAL LOW (ref 8.9–10.3)
Chloride: 103 mmol/L (ref 98–111)
Creatinine: 0.74 mg/dL (ref 0.44–1.00)
GFR, Estimated: >60 mL/min (ref >60)
Glucose, Bld: 198 mg/dL — ABNORMAL HIGH (ref 70–99)
Potassium: 3.6 mmol/L (ref 3.5–5.1)
Sodium: 141 mmol/L (ref 135–145)
Total Bilirubin: 0.5 mg/dL (ref 0.3–1.2)
Total Protein: 6.4 g/dL — ABNORMAL LOW (ref 6.5–8.1)

## 2022-11-03 MED ORDER — SODIUM CHLORIDE 0.9 % IV SOLN
150.0000 mg | Freq: Once | INTRAVENOUS | Status: AC
  Administered 2022-11-03: 150 mg via INTRAVENOUS
  Filled 2022-11-03: qty 5

## 2022-11-03 MED ORDER — DIPHENHYDRAMINE HCL 50 MG/ML IJ SOLN
50.0000 mg | Freq: Once | INTRAMUSCULAR | Status: AC
  Administered 2022-11-03: 50 mg via INTRAVENOUS
  Filled 2022-11-03: qty 1

## 2022-11-03 MED ORDER — TRAMADOL HCL 50 MG PO TABS
50.0000 mg | ORAL_TABLET | Freq: Four times a day (QID) | ORAL | 0 refills | Status: DC | PRN
Start: 2022-11-03 — End: 2022-12-15
  Filled 2022-11-03: qty 30, 8d supply, fill #0

## 2022-11-03 MED ORDER — HEPARIN SOD (PORK) LOCK FLUSH 100 UNIT/ML IV SOLN
500.0000 [IU] | Freq: Once | INTRAVENOUS | Status: AC | PRN
  Administered 2022-11-03: 500 [IU]

## 2022-11-03 MED ORDER — SODIUM CHLORIDE 0.9 % IV SOLN
10.0000 mg | Freq: Once | INTRAVENOUS | Status: AC
  Administered 2022-11-03: 10 mg via INTRAVENOUS
  Filled 2022-11-03: qty 1

## 2022-11-03 MED ORDER — FAMOTIDINE IN NACL 20-0.9 MG/50ML-% IV SOLN
20.0000 mg | Freq: Once | INTRAVENOUS | Status: AC
  Administered 2022-11-03: 20 mg via INTRAVENOUS
  Filled 2022-11-03: qty 50

## 2022-11-03 MED ORDER — SODIUM CHLORIDE 0.9 % IV SOLN
100.0000 mg/m2 | Freq: Once | INTRAVENOUS | Status: AC
  Administered 2022-11-03: 180 mg via INTRAVENOUS
  Filled 2022-11-03: qty 30

## 2022-11-03 MED ORDER — SODIUM CHLORIDE 0.9 % IV SOLN: Freq: Once | INTRAVENOUS | Status: AC

## 2022-11-03 MED ORDER — PALONOSETRON HCL INJECTION 0.25 MG/5ML
0.2500 mg | Freq: Once | INTRAVENOUS | Status: AC
  Administered 2022-11-03: 0.25 mg via INTRAVENOUS
  Filled 2022-11-03: qty 5

## 2022-11-03 MED ORDER — SODIUM CHLORIDE 0.9% FLUSH
10.0000 mL | INTRAVENOUS | Status: DC | PRN
  Administered 2022-11-03: 10 mL

## 2022-11-03 MED ORDER — SODIUM CHLORIDE 0.9 % IV SOLN
414.0000 mg | Freq: Once | INTRAVENOUS | Status: AC
  Administered 2022-11-03: 410 mg via INTRAVENOUS
  Filled 2022-11-03: qty 41

## 2022-11-03 MED ORDER — TRAMADOL HCL 50 MG PO TABS
50.0000 mg | ORAL_TABLET | Freq: Four times a day (QID) | ORAL | 0 refills | Status: DC | PRN
Start: 2022-11-03 — End: 2022-12-15

## 2022-11-03 NOTE — Patient Instructions (Signed)
[facility]   Discharge Instructions: Thank you for choosing [facility] to provide your oncology and hematology care.   If you have a lab appointment with the Cancer Center, please go directly to the Cancer Center and check in at the registration area.   Wear comfortable clothing and clothing appropriate for easy access to any Portacath or PICC line.   We strive to give you quality time with your provider. You may need to reschedule your appointment if you arrive late (15 or more minutes).  Arriving late affects you and other patients whose appointments are after yours.  Also, if you miss three or more appointments without notifying the office, you may be dismissed from the clinic at the provider's discretion.      For prescription refill requests, have your pharmacy contact our office and allow 72 hours for refills to be completed.    Today you received the following chemotherapy and/or immunotherapy agents Paclitaxel (TAXOL) & Carboplatin (PARAPLATIN).      To help prevent nausea and vomiting after your treatment, we encourage you to take your nausea medication as directed.  BELOW ARE SYMPTOMS THAT SHOULD BE REPORTED IMMEDIATELY: *FEVER GREATER THAN 100.4 F (38 C) OR HIGHER *CHILLS OR SWEATING *NAUSEA AND VOMITING THAT IS NOT CONTROLLED WITH YOUR NAUSEA MEDICATION *UNUSUAL SHORTNESS OF BREATH *UNUSUAL BRUISING OR BLEEDING *URINARY PROBLEMS (pain or burning when urinating, or frequent urination) *BOWEL PROBLEMS (unusual diarrhea, constipation, pain near the anus) TENDERNESS IN MOUTH AND THROAT WITH OR WITHOUT PRESENCE OF ULCERS (sore throat, sores in mouth, or a toothache) UNUSUAL RASH, SWELLING OR PAIN  UNUSUAL VAGINAL DISCHARGE OR ITCHING   Items with * indicate a potential emergency and should be followed up as soon as possible or go to the Emergency Department if any problems should occur.  Please show the CHEMOTHERAPY ALERT CARD  or IMMUNOTHERAPY ALERT CARD at check-in to the Emergency Department and triage nurse.  Should you have questions after your visit or need to cancel or reschedule your appointment, please contact [facility]  Dept: [phone]  and follow the prompts.  Office hours are 8:00 a.m. to 4:30 p.m. Monday - Friday. Please note that voicemails left after 4:00 p.m. may not be returned until the following business day.  We are closed weekends and major holidays. You have access to a nurse at all times for urgent questions. Please call the main number to the clinic Dept: [phone] and follow the prompts.   For any non-urgent questions, you may also contact your provider using MyChart. We now offer e-Visits for anyone 8 and older to request care online for non-urgent symptoms. For details visit [email or web address].   Also download the MyChart app! Go to the app store, search "MyChart", open the app, select [facility], and log in with your MyChart username and password.  Paclitaxel Injection What is this medication? PACLITAXEL (PAK li TAX el) treats some types of cancer. It works by slowing down the growth of cancer cells. This medicine may be used for other purposes; ask your health care provider or pharmacist if you have questions. COMMON BRAND NAME(S): Onxol, Taxol What should I tell my care team before I take this medication? They need to know if you have any of these conditions: Heart disease Liver disease Low white blood cell levels An unusual or allergic reaction to paclitaxel, other medications, foods, dyes, or preservatives If you or your partner are pregnant or  trying to get pregnant Breast-feeding How should I use this medication? This medication is injected into a vein. It is given by your care team in a hospital or clinic setting. Talk to your care team about the use of this medication in children. While it may be given to children for selected  conditions, precautions do apply. Overdosage: If you think you have taken too much of this medicine contact a poison control center or emergency room at once. NOTE: This medicine is only for you. Do not share this medicine with others. What if I miss a dose? Keep appointments for follow-up doses. It is important not to miss your dose. Call your care team if you are unable to keep an appointment. What may interact with this medication? Do not take this medication with any of the following: Live virus vaccines Other medications may affect the way this medication works. Talk with your care team about all of the medications you take. They may suggest changes to your treatment plan to lower the risk of side effects and to make sure your medications work as intended. This list may not describe all possible interactions. Give your health care provider a list of all the medicines, herbs, non-prescription drugs, or dietary supplements you use. Also tell them if you smoke, drink alcohol, or use illegal drugs. Some items may interact with your medicine. What should I watch for while using this medication? Your condition will be monitored carefully while you are receiving this medication. You may need blood work while taking this medication. This medication may make you feel generally unwell. This is not uncommon as chemotherapy can affect healthy cells as well as cancer cells. Report any side effects. Continue your course of treatment even though you feel ill unless your care team tells you to stop. This medication can cause serious allergic reactions. To reduce the risk, your care team may give you other medications to take before receiving this one. Be sure to follow the directions from your care team. This medication may increase your risk of getting an infection. Call your care team for advice if you get a fever, chills, sore throat, or other symptoms of a cold or flu. Do not treat yourself. Try to avoid  being around people who are sick. This medication may increase your risk to bruise or bleed. Call your care team if you notice any unusual bleeding. Be careful brushing or flossing your teeth or using a toothpick because you may get an infection or bleed more easily. If you have any dental work done, tell your dentist you are receiving this medication. Talk to your care team if you may be pregnant. Serious birth defects can occur if you take this medication during pregnancy. Talk to your care team before breastfeeding. Changes to your treatment plan may be needed. What side effects may I notice from receiving this medication? Side effects that you should report to your care team as soon as possible: Allergic reactions--skin rash, itching, hives, swelling of the face, lips, tongue, or throat Heart rhythm changes--fast or irregular heartbeat, dizziness, feeling faint or lightheaded, chest pain, trouble breathing Increase in blood pressure Infection--fever, chills, cough, sore throat, wounds that don't heal, pain or trouble when passing urine, general feeling of discomfort or being unwell Low blood pressure--dizziness, feeling faint or lightheaded, blurry vision Low red blood cell level--unusual weakness or fatigue, dizziness, headache, trouble breathing Painful swelling, warmth, or redness of the skin, blisters or sores at the infusion site Pain, tingling,  or numbness in the hands or feet Slow heartbeat--dizziness, feeling faint or lightheaded, confusion, trouble breathing, unusual weakness or fatigue Unusual bruising or bleeding Side effects that usually do not require medical attention (report to your care team if they continue or are bothersome): Diarrhea Hair loss Joint pain Loss of appetite Muscle pain Nausea Vomiting This list may not describe all possible side effects. Call your doctor for medical advice about side effects. You may report side effects to FDA at 1-800-FDA-1088. Where  should I keep my medication? This medication is given in a hospital or clinic. It will not be stored at home. NOTE: This sheet is a summary. It may not cover all possible information. If you have questions about this medicine, talk to your doctor, pharmacist, or health care provider.  2024 Elsevier/Gold Standard (2021-08-03 00:00:00)  Carboplatin Injection What is this medication? CARBOPLATIN (KAR boe pla tin) treats some types of cancer. It works by slowing down the growth of cancer cells. This medicine may be used for other purposes; ask your health care provider or pharmacist if you have questions. COMMON BRAND NAME(S): Paraplatin What should I tell my care team before I take this medication? They need to know if you have any of these conditions: Blood disorders Hearing problems Kidney disease Recent or ongoing radiation therapy An unusual or allergic reaction to carboplatin, cisplatin, other medications, foods, dyes, or preservatives Pregnant or trying to get pregnant Breast-feeding How should I use this medication? This medication is injected into a vein. It is given by your care team in a hospital or clinic setting. Talk to your care team about the use of this medication in children. Special care may be needed. Overdosage: If you think you have taken too much of this medicine contact a poison control center or emergency room at once. NOTE: This medicine is only for you. Do not share this medicine with others. What if I miss a dose? Keep appointments for follow-up doses. It is important not to miss your dose. Call your care team if you are unable to keep an appointment. What may interact with this medication? Medications for seizures Some antibiotics, such as amikacin, gentamicin, neomycin, streptomycin, tobramycin Vaccines This list may not describe all possible interactions. Give your health care provider a list of all the medicines, herbs, non-prescription drugs, or dietary  supplements you use. Also tell them if you smoke, drink alcohol, or use illegal drugs. Some items may interact with your medicine. What should I watch for while using this medication? Your condition will be monitored carefully while you are receiving this medication. You may need blood work while taking this medication. This medication may make you feel generally unwell. This is not uncommon, as chemotherapy can affect healthy cells as well as cancer cells. Report any side effects. Continue your course of treatment even though you feel ill unless your care team tells you to stop. In some cases, you may be given additional medications to help with side effects. Follow all directions for their use. This medication may increase your risk of getting an infection. Call your care team for advice if you get a fever, chills, sore throat, or other symptoms of a cold or flu. Do not treat yourself. Try to avoid being around people who are sick. Avoid taking medications that contain aspirin, acetaminophen, ibuprofen, naproxen, or ketoprofen unless instructed by your care team. These medications may hide a fever. Be careful brushing or flossing your teeth or using a toothpick because you may  get an infection or bleed more easily. If you have any dental work done, tell your dentist you are receiving this medication. Talk to your care team if you wish to become pregnant or think you might be pregnant. This medication can cause serious birth defects. Talk to your care team about effective forms of contraception. Do not breast-feed while taking this medication. What side effects may I notice from receiving this medication? Side effects that you should report to your care team as soon as possible: Allergic reactions--skin rash, itching, hives, swelling of the face, lips, tongue, or throat Infection--fever, chills, cough, sore throat, wounds that don't heal, pain or trouble when passing urine, general feeling of  discomfort or being unwell Low red blood cell level--unusual weakness or fatigue, dizziness, headache, trouble breathing Pain, tingling, or numbness in the hands or feet, muscle weakness, change in vision, confusion or trouble speaking, loss of balance or coordination, trouble walking, seizures Unusual bruising or bleeding Side effects that usually do not require medical attention (report to your care team if they continue or are bothersome): Hair loss Nausea Unusual weakness or fatigue Vomiting This list may not describe all possible side effects. Call your doctor for medical advice about side effects. You may report side effects to FDA at 1-800-FDA-1088. Where should I keep my medication? This medication is given in a hospital or clinic. It will not be stored at home. NOTE: This sheet is a summary. It may not cover all possible information. If you have questions about this medicine, talk to your doctor, pharmacist, or health care provider.  2024 Elsevier/Gold Standard (2021-07-06 00:00:00)

## 2022-11-03 NOTE — Progress Notes (Signed)
Dr. Truett Perna agreed to order Tramadol for severe pain. If she needs stronger pain med, she needs to re-establish with provider that managed her chronic pain in the past.

## 2022-11-04 ENCOUNTER — Encounter: Payer: Self-pay | Admitting: Physician Assistant

## 2022-11-04 ENCOUNTER — Telehealth: Payer: Self-pay

## 2022-11-04 ENCOUNTER — Encounter: Payer: Self-pay | Admitting: Nurse Practitioner

## 2022-11-04 ENCOUNTER — Inpatient Hospital Stay: Payer: Medicare Other

## 2022-11-04 ENCOUNTER — Ambulatory Visit: Payer: Medicare Other

## 2022-11-04 ENCOUNTER — Telehealth: Admit: 2022-11-04 | Payer: PRIVATE HEALTH INSURANCE | Primary: Internal Medicine

## 2022-11-04 NOTE — Telephone Encounter (Signed)
24 Hour Call Back  Telephone call to patient post her first time Taxol/Carboplatin. Patient verbalized that she is doing well except she thinks she may have a UTI. Patient has sent a MyChart message to physician regarding this. She also mentioned that the Tramadol 50 mg that was prescribed by Dr. Truett Perna is not helping her pain. Patient was reminded that per Dr. Truett Perna, she needs to reestablish service with the previous physician who managed her pain for a stronger prescription. She denies any other concerns at this time and knows to call the clinic if this changes.

## 2022-11-08 ENCOUNTER — Ambulatory Visit: Admit: 2022-11-08 | Payer: BLUE CROSS/BLUE SHIELD | Attending: Hematology | Primary: Internal Medicine

## 2022-11-08 ENCOUNTER — Telehealth: Admit: 2022-11-08 | Payer: PRIVATE HEALTH INSURANCE | Attending: Hematology | Primary: Internal Medicine

## 2022-11-08 ENCOUNTER — Encounter: Admit: 2022-11-08 | Payer: PRIVATE HEALTH INSURANCE | Attending: Hematology | Primary: Internal Medicine

## 2022-11-08 ENCOUNTER — Telehealth: Admit: 2022-11-08 | Payer: PRIVATE HEALTH INSURANCE | Primary: Internal Medicine

## 2022-11-08 DIAGNOSIS — B1920 Unspecified viral hepatitis C without hepatic coma: Secondary | ICD-10-CM

## 2022-11-08 DIAGNOSIS — C3490 Malignant neoplasm of unspecified part of unspecified bronchus or lung: Secondary | ICD-10-CM

## 2022-11-08 DIAGNOSIS — J449 Chronic obstructive pulmonary disease, unspecified: Secondary | ICD-10-CM

## 2022-11-08 DIAGNOSIS — K529 Noninfective gastroenteritis and colitis, unspecified: Secondary | ICD-10-CM

## 2022-11-08 DIAGNOSIS — Z720 Tobacco use: Secondary | ICD-10-CM

## 2022-11-08 NOTE — Telephone Encounter
I called Hospital doctor (daughter); there was no answer; I left a message requesting a callback to complete the RN New Patient Intake.

## 2022-11-08 NOTE — Telephone Encounter
Call received from the front next at [facility] office - Pt accidentally showed up to our [facility] office.Pt is willing to still drive to [employer].[name] [phone]

## 2022-11-10 ENCOUNTER — Ambulatory Visit: Payer: Medicare Other | Admitting: Physician Assistant

## 2022-11-10 NOTE — Telephone Encounter
I called and spoke to [name] and completed the RN New Patient Intake.

## 2022-11-11 ENCOUNTER — Inpatient Hospital Stay: Payer: Medicare Other

## 2022-11-11 ENCOUNTER — Inpatient Hospital Stay
Admit: 2022-11-11 | Discharge: 2022-11-11 | Payer: PRIVATE HEALTH INSURANCE | Attending: Radiation Oncology | Primary: Internal Medicine

## 2022-11-11 ENCOUNTER — Encounter
Admit: 2022-11-11 | Payer: PRIVATE HEALTH INSURANCE | Attending: Student in an Organized Health Care Education/Training Program | Primary: Internal Medicine

## 2022-11-11 ENCOUNTER — Encounter: Admit: 2022-11-11 | Payer: PRIVATE HEALTH INSURANCE | Attending: Radiation Oncology | Primary: Internal Medicine

## 2022-11-11 VITALS — BP 112/64 | HR 105 | Temp 97.9°F | Resp 20

## 2022-11-11 DIAGNOSIS — J449 Chronic obstructive pulmonary disease, unspecified: Secondary | ICD-10-CM

## 2022-11-11 DIAGNOSIS — C3411 Malignant neoplasm of upper lobe, right bronchus or lung: Secondary | ICD-10-CM

## 2022-11-11 DIAGNOSIS — B1920 Unspecified viral hepatitis C without hepatic coma: Secondary | ICD-10-CM

## 2022-11-11 DIAGNOSIS — Z720 Tobacco use: Secondary | ICD-10-CM

## 2022-11-11 DIAGNOSIS — C7801 Secondary malignant neoplasm of right lung: Secondary | ICD-10-CM

## 2022-11-11 DIAGNOSIS — Z510 Encounter for antineoplastic radiation therapy: Secondary | ICD-10-CM

## 2022-11-11 DIAGNOSIS — C3490 Malignant neoplasm of unspecified part of unspecified bronchus or lung: Secondary | ICD-10-CM

## 2022-11-11 DIAGNOSIS — K529 Noninfective gastroenteritis and colitis, unspecified: Secondary | ICD-10-CM

## 2022-11-11 DIAGNOSIS — C210 Malignant neoplasm of anus, unspecified: Secondary | ICD-10-CM

## 2022-11-11 DIAGNOSIS — Z5111 Encounter for antineoplastic chemotherapy: Secondary | ICD-10-CM | POA: Diagnosis not present

## 2022-11-11 DIAGNOSIS — Z95828 Presence of other vascular implants and grafts: Secondary | ICD-10-CM

## 2022-11-11 LAB — CBC WITH DIFFERENTIAL (CANCER CENTER ONLY)
Abs Immature Granulocytes: 0.02 10*3/uL (ref 0.00–0.07)
Basophils Absolute: 0 10*3/uL (ref 0.0–0.1)
Basophils Relative: 0 %
Eosinophils Absolute: 0.1 10*3/uL (ref 0.0–0.5)
Eosinophils Relative: 2 %
HCT: 28.6 % — ABNORMAL LOW (ref 36.0–46.0)
Hemoglobin: 9.6 g/dL — ABNORMAL LOW (ref 12.0–15.0)
Immature Granulocytes: 1 %
Lymphocytes Relative: 17 %
Lymphs Abs: 0.5 10*3/uL — ABNORMAL LOW (ref 0.7–4.0)
MCH: 31.9 pg (ref 26.0–34.0)
MCHC: 33.6 g/dL (ref 30.0–36.0)
MCV: 95 fL (ref 80.0–100.0)
Monocytes Absolute: 0.2 10*3/uL (ref 0.1–1.0)
Monocytes Relative: 8 %
Neutro Abs: 2.2 10*3/uL (ref 1.7–7.7)
Neutrophils Relative %: 72 %
Platelet Count: 188 10*3/uL (ref 150–400)
RBC: 3.01 MIL/uL — ABNORMAL LOW (ref 3.87–5.11)
RDW: 12.3 % (ref 11.5–15.5)
WBC Count: 3.1 10*3/uL — ABNORMAL LOW (ref 4.0–10.5)
nRBC: 0 % (ref 0.0–0.2)

## 2022-11-11 MED ORDER — HEPARIN SOD (PORK) LOCK FLUSH 100 UNIT/ML IV SOLN
500.0000 [IU] | Freq: Once | INTRAVENOUS | Status: AC
  Administered 2022-11-11: 500 [IU] via INTRAVENOUS

## 2022-11-11 MED ORDER — SODIUM CHLORIDE 0.9% FLUSH
10.0000 mL | Freq: Once | INTRAVENOUS | Status: AC
  Administered 2022-11-11: 10 mL via INTRAVENOUS

## 2022-11-11 NOTE — Progress Notes (Signed)
Patient arrive to infusion room for port flush with lab.  Patient stated she felt very anxious as she didn't know she would have to wait to be seen. Patient complained of R leg numbness, which she stated has happened in the past and is not new. Patient was moved from wheelchair to recliner and patient noted an improvement in numbness.  Patient with complaint of nausea without vomiting.  Patient stated she had not eaten yet this morning.  Patient was offered snack and declined, stating she would be going to get something to eat after her appointment with our office.  Patient BP was checked with machine and then manually.  Patient stated her BP usually runs low and that she has been drinking fluids at home.  Patient was offered IVF and IV nausea medications, but patient declined.  Patient also reported mucous and small amounts of blood when wiping after having a BM.  Patient stated this is not new and it has not worsened since seeing the physician last.  Dr. Truett Perna was notified of patient's assessment, VS, and complaints.  Per Dr. Truett Perna, patient should continue to push oral hydration; if she notices an increase in rectal bleeding, patient needs to contact Dr. Maisie Fus, her surgeon.  Patient was informed of Dr. Kalman Drape response and verbalized understanding. Patient instructed to contact office with any future questions or concerns.  Patient verbalized understanding.

## 2022-11-11 NOTE — Patient Instructions (Signed)

## 2022-11-11 NOTE — Progress Notes
Re: Courtney Knox ([birth year])MRN: [id number] Provider: Jaclyn Prime, MDDate of service: 8/16/2024RADIATION ONCOLOGY FOLLOW-UP REPORT Identification/Chief Complaint: Courtney Knox is a 67 y.o. 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 She returns today for a routine follow-up visit.Interval History: The patient was last seen in our department on 10/27/20 at the conclusion of radiation therapy. She tolerated treatment well. Today, she notes doing well overall. She reports occasional dry cough but denies new or worsening cough, SOB, DOE or chest pain. She has improvement in appetite, eating solid foods regularly and boosting her nutrition with ensure. Otherwise, she has no concerns today. MEDICATIONS:Current Outpatient Medications:   b complex vitamins, 1 tablet, Oral, Daily (Patient not taking: Reported on 10/28/2022)  calcium carbonate/vitamin D3 (CALCIUM WITH VITAMIN D3 ORAL), 1 tablet, Oral, Daily (Patient not taking: Reported on 10/28/2022)  Epclusa, 1 tablet, Oral, Daily (Patient not taking: Reported on 10/28/2022)  famotidine, 20 mg, Oral, Daily (Patient not taking: Reported on 10/28/2022)  Ensure, 296 mL, Oral, TID WC (Patient not taking: Reported on 10/28/2022)  prochlorperazine, 10 mg, Oral, Q6H PRN (Patient not taking: Reported on 10/28/2022)  sertraline, 25 mg, Oral, Daily (Patient not taking: Reported on 10/28/2022)PHYSICAL EXAM: ECOG Performance Status:  Pain score:  Pain Score:   0 - No painBP 105/67  - Pulse 78  - Temp 98.3 ?F (36.8 ?C) (Temporal)  - Resp 18  - Wt 40.3 kg  - SpO2 98%  - BMI 16.52 kg/m? ASSESSMENT/PLAN: Courtney Knox is a 67 y.o. 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 Courtney Knox now returns approximately 2 weeks since completing radiation therapy. She is doing well with minimal side effects of treatment. She is scheduled for [place] C/A/P on 11/18/22 and we will follow up on those results. Patient is scheduled to see Dr. Maxcine Ham on 11/22/22. We will plan to have her return to our department for routine follow-up in 2-3 months pending imaging review. Courtney Knox was encouraged to contact us in the interim should she have any questions or concerns.Karlene Einstein, MDPGY-4, Radiation OncologyDepartment of Therapeutic [phone] ADDENDUM: I have personally seen and evaluated Courtney Knox with Dr. Katherina Right. I have personally reviewed the patient's prior records and all available imaging noted in the HPI. I have incorporated my edits and agree with the above documented history, physical examination, assessment, and plan.  She notes some improvement in PO intake over the past several days. Pain in back better than prior, but still persistent somewhat. Breathing stable overall. She is in NAD, but is thin appearing.She has surveillance imaging next week, scheduled by Dr. Maxcine Ham. I will review scans and f/u accordingly (likely in the next 2-3 months).Alphonzo Dublin, MD, PhDYale Radiation OncologyAugust 16, 20243:29 PM

## 2022-11-12 ENCOUNTER — Encounter: Admit: 2022-11-12 | Payer: PRIVATE HEALTH INSURANCE | Primary: Internal Medicine

## 2022-11-15 ENCOUNTER — Ambulatory Visit (INDEPENDENT_AMBULATORY_CARE_PROVIDER_SITE_OTHER): Payer: Medicare Other | Admitting: Family Medicine

## 2022-11-15 ENCOUNTER — Encounter: Payer: Self-pay | Admitting: Family Medicine

## 2022-11-15 ENCOUNTER — Encounter: Payer: Self-pay | Admitting: Physical Therapy

## 2022-11-15 ENCOUNTER — Ambulatory Visit (INDEPENDENT_AMBULATORY_CARE_PROVIDER_SITE_OTHER): Payer: Medicare Other | Admitting: Physical Therapy

## 2022-11-15 VITALS — BP 96/60 | HR 105 | Temp 98.2°F | Ht 67.0 in | Wt 142.0 lb

## 2022-11-15 DIAGNOSIS — R296 Repeated falls: Secondary | ICD-10-CM | POA: Diagnosis not present

## 2022-11-15 DIAGNOSIS — H6990 Unspecified Eustachian tube disorder, unspecified ear: Secondary | ICD-10-CM | POA: Diagnosis not present

## 2022-11-15 DIAGNOSIS — R2681 Unsteadiness on feet: Secondary | ICD-10-CM

## 2022-11-15 DIAGNOSIS — R262 Difficulty in walking, not elsewhere classified: Secondary | ICD-10-CM

## 2022-11-15 DIAGNOSIS — M6281 Muscle weakness (generalized): Secondary | ICD-10-CM | POA: Diagnosis not present

## 2022-11-15 DIAGNOSIS — N390 Urinary tract infection, site not specified: Secondary | ICD-10-CM | POA: Diagnosis not present

## 2022-11-15 DIAGNOSIS — S61217S Laceration without foreign body of left little finger without damage to nail, sequela: Secondary | ICD-10-CM

## 2022-11-15 LAB — POCT URINALYSIS DIPSTICK
Bilirubin, UA: POSITIVE
Blood, UA: NEGATIVE
Glucose, UA: NEGATIVE
Ketones, UA: NEGATIVE
Leukocytes, UA: NEGATIVE
Nitrite, UA: NEGATIVE
Protein, UA: POSITIVE — AB
Spec Grav, UA: >=1.03 — AB (ref 1.010–1.025)
Urobilinogen, UA: 1 E.U./dL — AB
pH, UA: 5.5 (ref 5.0–8.0)

## 2022-11-15 MED ORDER — NITROFURANTOIN MONOHYD MACRO 100 MG PO CAPS: 100.0000 mg | ORAL_CAPSULE | Freq: Two times a day (BID) | ORAL | 0 refills | Status: DC

## 2022-11-15 NOTE — Therapy (Addendum)
 OUTPATIENT PHYSICAL THERAPY BALANCE AND STRENGTH TREATMENT PHYSICAL THERAPY DISCHARGE SUMMARY  Visits from Start of Care: 2  Current functional level related to goals / functional outcomes: Could not reassess due to unplanned discharged    Remaining deficits: Could not reassess due to unplanned discharged    Education / Equipment: Could not reassess due to unplanned discharged    Patient agrees to discharge. Patient goals were not met. Patient is being discharged due to not returning since the last visit.  9:10 AM, 06/07/23 Tereasa Coop, DPT Physical Therapy with [facility]   [name] MRN: [id number] DOB:Aug 24, 1955, 67 y.o., female Today's Date: 11/15/2022  END OF SESSION:  PT End of Session - 11/15/22 1017     Visit Number 2    Number of Visits 16    Date for PT Re-Evaluation 01/19/23    Authorization Type [employer]    PT Start Time 1017    PT Stop Time 1058    PT Time Calculation (min) 41 min    Equipment Utilized During Treatment Gait belt    Activity Tolerance Patient limited by fatigue;Patient limited by pain    Behavior During Therapy Anxious             Past Medical History:  Diagnosis Date   Arthritis    Breast cancer (HCC)    Cataract, left 09/29/2017   Chronic pain syndrome, on Neurontin 09/30/2017   Dry eye syndrome of both eyes, on Restasis 09/30/2017   Emphysema lung (HCC)    Family history of breast cancer    Family history of melanoma    Family history of ovarian cancer    Family history of prostate cancer    Family history of throat cancer    Frequent urinary tract infections    GAD (generalized anxiety disorder) 03/11/2015   Genital herpes    History of chicken pox    History of degenerative disc disease 10/27/2002   Kidney stone    Knee osteonecrosis, 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   Major depression, recurrent, chronic (HCC), followed by Psych, on Wellbutrin, Cymbalta 09/30/2017   Mixed hyperlipidemia, on Zetia and  Zocor 09/30/2017   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   MVP (mitral valve prolapse) 03/13/2015   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   Nuclear sclerosis 06/10/2013   Panic disorder, followed by Psych, on Xanax 09/30/2017   Personal history of breast cancer    Psoriasis    PTSD (post-traumatic stress disorder) 03/11/2015   Stroke (HCC)    Total knee replacement status, 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   Vitamin D deficiency 09/30/2017   Past Surgical History:  Procedure Laterality Date   AUGMENTATION MAMMAPLASTY Bilateral    BREAST BIOPSY     IR IMAGING GUIDED PORT INSERTION  10/31/2022   KIDNEY STONE SURGERY  01/2022   MASTECTOMY Left 1995   has a tram flap   RECONSTRUCTION BREAST W/ TRAM FLAP Left 2003   Patient Active Problem List   Diagnosis Date Noted   Incontinence of feces with fecal urgency 10/30/2022   Insomnia due to medical condition 05/26/2022   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   Age-related osteoporosis without current pathological fracture 03/10/2022   Neuropathy 01/28/2022   Positive ANA (antinuclear antibody) 01/28/2022   Recurrent falls 01/28/2022   Aortic atherosclerosis (HCC) 10/29/2020   Emphysema lung (HCC) 10/29/2020   Genetic testing 03/27/2018   Personal history of breast cancer    Bilateral lower extremity edema 02/03/2018   Epidermoid cyst of skin 02/03/2018  B12 deficiency 02/03/2018   Mixed hyperlipidemia, on Zetia and Zocor 09/30/2017   Chronic pain syndrome, on Neurontin 09/30/2017   Vitamin D deficiency 09/30/2017   Major depression, recurrent, chronic (HCC), followed by Psych, on Wellbutrin, Cymbalta 09/30/2017   Panic disorder, followed by Psych, on Xanax 09/30/2017   Dry eye syndrome of both eyes, on Restasis 09/30/2017   History of stroke, on ASA 09/30/2017   Cataract, left 09/29/2017   Knee osteonecrosis, 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   Chronic low back pain 05/24/2017   MVP (mitral valve prolapse) 03/13/2015   GAD (generalized anxiety  disorder) 03/11/2015   PTSD (post-traumatic stress disorder) 03/11/2015   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   Nuclear sclerosis 06/10/2013   History of degenerative disc disease 10/27/2002   History of brain tumor 09/01/1997    PCP: Allwardt, Crist Infante, PA-C  REFERRING PROVIDER: Allwardt, Crist Infante, PA-C  REFERRING DIAG: R53.1 (ICD-10-CM) - Weakness R29.6 (ICD-10-CM) - Recurrent falls C21.0 (ICD-10-CM) - Anal cancer (HCC)  THERAPY DIAG:  Muscle weakness (generalized)  Difficulty in walking, not elsewhere classified  Repeated falls  Unsteadiness on feet  Rationale for Evaluation and Treatment: Rehabilitation  ONSET DATE: Earlier this year   SUBJECTIVE:   SUBJECTIVE STATEMENT: Been in a lot of pain and has not been able to do her exercises. States she doesn't have any pain medication that helps. States she currently has tramadol but that doesn't help.   States she has been having pain in her butt from the radiation and runny poop . States sh also has neuropathy in her left leg and has a history of sciatica.   States she has a walker but can't use it at home as her home is small. States that she only goes up the stairs to shower but currently is giving her self sponge baths. States when she has been getting treatments since the beginning of the year. States she is getting chemo 4 treatments in 3 months.   States she has been sitting  a lot because she weak and not feeling well.   PERTINENT HISTORY: Anal cancer (current), COPD, chronic pain syndrome, neuropathy toes on the right and foot and shin on the left. CVA with right sided weakness, difficulty PAIN:  Are you having pain? Yes: NPRS scale: 10/10 Pain location: hips/legs/back, buttocks  Pain description: constant achy Aggravating factors: sitting,  Relieving factors:  nothing  PRECAUTIONS: Fall  RED FLAGS: Current cancer treatments   WEIGHT BEARING  RESTRICTIONS: No  FALLS:  Has patient fallen in last 6 months? Yes. Number of falls 4  LIVING ENVIRONMENT: Lives with: lives alone Lives in: House/apartment Stairs: Yes: Internal: 12 steps; can reach both Has following equipment at home: Dan Humphreys - 2 wheeled  OCCUPATION: retired  PLOF: Independent  PATIENT GOALS: to be able walk around better and have less falls    OBJECTIVE:  COGNITION: Overall cognitive status: Within functional limits for tasks assessed     SENSATION: Not tested    POSTURE: rounded shoulders, forward head, posterior pelvic tilt, and flexed trunk   PALPATION: Tenderness to palpation   SPO2  At Rest 97%   Standing 94  After walking 89%    LE Measurements Lower Extremity Right EVAL Left EVAL   A/PROM MMT A/PROM MMT  Hip Flexion  3*  3*  Hip Extension      Hip Abduction (seated)  4-*  4-*  Hip Adduction (seated)  4-  4-  Hip  Internal rotation      Hip External rotation      Knee Flexion  3+  3+  Knee Extension  4-  3+  Ankle Dorsiflexion  3+  3+  Ankle Plantarflexion      Ankle Inversion      Ankle Eversion       (Blank rows = not tested) * pain   FUNCTIONAL TESTS:  STS - uses hands - second attempt - light headed - slow labored movements fatigue  GAIT: Distance walked: 50 ft Assistive device utilized: Walker - 2 wheeled Level of assistance: SBA and CGA Comments: RW slow labored gait - short cadence  slumped posture   TODAY'S TREATMENT:                                                                                                                              DATE:   11/15/2022  Therapeutic Exercise:  Aerobic: Supine: Prone:  Seated: goddess pose 2.5 minutes 10" hlds, LAQs 5" holds 1.5 minutes alternating B, hip add iso 5" holds with 1.5 minute round, hip abd 5" holds with 1.5 minute round, STS elevated chair -x10 total   Standing: walking with no AD - min assist - slow walking 5 minutes x3  Neuromuscular  Re-education: Manual Therapy: Therapeutic Activity: Self Care: Trigger Point Dry Needling:  Modalities:   PATIENT EDUCATION:  Education details: on HEP, on using walker every time she leaves the house Person educated: Patient Education method: Programmer, multimedia, Demonstration, and Handouts Education comprehension: verbalized understanding  HOME EXERCISE PROGRAM: [id number]  ASSESSMENT:  CLINICAL IMPRESSION: 11/15/2022 Instructed patient to use walker ever time she leave the house secondary to fall risk (did not bring her walker to today's session). Progressed exercises as able. Took long rest breaks to account for fatigue in legs and increased respiratory rate. Overall patient performed well during session.    Eval: Patient presents to therapy with complaints of weakness, pain and balance deficits with a history of multiple falls in the last 6 months. Session focused on education and review of current presentation as well as addressing risks at home that increase risk of falling. Patient currently under going anal cancer treatment including chemotherapy and radiation that is going to greatly affect progress and overall PT treatment. Patient would greatly benefit from skilled physical therapy to reduce risk of falling and improve overall function and quality of life.    OBJECTIVE IMPAIRMENTS: decreased activity tolerance, decreased balance, decreased endurance, decreased knowledge of use of DME, decreased mobility, difficulty walking, decreased ROM, decreased strength, improper body mechanics, postural dysfunction, and pain.   ACTIVITY LIMITATIONS: lifting, sitting, standing, squatting, sleeping, stairs, transfers, bed mobility, dressing, and locomotion level  PARTICIPATION LIMITATIONS: meal prep, cleaning, and community activity  PERSONAL FACTORS: Fitness and 1-2 comorbidities: cancer, on radiation and chemo treatment  are also affecting patient's functional outcome.   REHAB POTENTIAL:  Good  CLINICAL DECISION MAKING: Evolving/moderate complexity  EVALUATION COMPLEXITY: Moderate  GOALS: GOALS: Goals reviewed with patient? yes  SHORT TERM GOALS: Target date: 12/08/2022  Patient will be independent in self management strategies to improve quality of life and functional outcomes. Baseline: New Program Goal status: INITIAL  2.  Patient will report at least 25% improvement in overall symptoms and/or function to demonstrate improved functional mobility Baseline: 0% better Goal status: INITIAL  3.  Patient will be able to perform 5 STS from standard height chair to demonstrate improved LE endurance Baseline: unable Goal status: INITIAL  4.  Patient will report standing up for at least 2 minutes every hour to demonstrate improved functional endurance. Baseline: unable Goal status: INITIAL    LONG TERM GOALS: Target date: 01/19/2023    Patient will report at least 50% improvement in overall symptoms and/or function to demonstrate improved functional mobility Baseline: 0% better Goal status: INITIAL  2.  Patient will be able walk for 5 minutes with SBA and RW to demonstrate improved functional endurance.  Baseline:unable Goal status: INITIAL  3.  Patient will demonstrate at least 4-/5 MMT in seated position in lower legs to demonstrate improved LE strengthening.  Baseline:  Goal status: INITIAL   PLAN:  PT FREQUENCY: 1-2x/week for a total of 16 visits   PT DURATION: 12 weeks  PLANNED INTERVENTIONS: Therapeutic exercises, Therapeutic activity, Neuromuscular re-education, Balance training, Gait training, Patient/Family education, Self Care, Joint mobilization, Joint manipulation, Stair training, Vestibular training, Canalith repositioning, Orthotic/Fit training, Prosthetic training, DME instructions, Aquatic Therapy, Dry Needling, Electrical stimulation, Spinal manipulation, Spinal mobilization, Cryotherapy, Moist heat, Taping, Traction, Ultrasound,  Ionotophoresis 4mg /ml Dexamethasone, Manual therapy, and Re-evaluation.   PLAN FOR NEXT SESSION: balance and functional endurance, seated strengthening. STS    11:00 AM, 11/15/22 Tereasa Coop, DPT Physical Therapy with [facility]

## 2022-11-15 NOTE — Patient Instructions (Addendum)
It was very nice to see you today!  You have a urinary tract infection.  Please start the antibiotic.  We will check urine culture and contact you in a couple of days.  Please let us know if not improving.   Please try using Flonase to help get the fluid out of your ears.  Please keep the Steri-Strips in place.  Let us know if this does not continue to heal up over the next several days.  Return if symptoms worsen or fail to improve.   Take care, Dr Jimmey Ralph  PLEASE NOTE:  If you had any lab tests, please let us know if you have not heard back within a few days. You may see your results on mychart before we have a chance to review them but we will give you a call once they are reviewed by [place].   If we ordered any referrals today, please let us know if you have not heard from their office within the next week.   If you had any urgent prescriptions sent in today, please check with the pharmacy within an hour of our visit to make sure the prescription was transmitted appropriately.   Please try these tips to maintain a healthy lifestyle:  Eat at least 3 REAL meals and 1-2 snacks per day.  Aim for no more than 5 hours between eating.  If you eat breakfast, please do so within one hour of getting up.   Each meal should contain half fruits/vegetables, one quarter protein, and one quarter carbs (no bigger than a computer mouse)  Cut down on sweet beverages. This includes juice, soda, and sweet tea.   Drink at least 1 glass of water with each meal and aim for at least 8 glasses per day  Exercise at least 150 minutes every week.

## 2022-11-15 NOTE — Progress Notes (Signed)
   Alyssa Deleon is a 67 y.o. female who presents today for an office visit.  Assessment/Plan:  New/Acute Problems: Urinary tract infection History and UA consistent with UTI.  Will empirically start Macrobid while we await culture results.  Encouraged hydration.  No signs of systemic infection or illness today.  We discussed reasons to return to care.  Eustachian tube dysfunction No red flags.  Need to avoid antihistamines due to history of dry eyes.  We will start Flonase.  Discussed auto insufflation.  She will let us know if not improving we can refer to ENT.  Finger laceration Too late to repair via sutures today.  Steri-Strips were placed in the office.  We discussed wound care and reasons to return to care.  Follow-up as needed.  Suspect this will help over the next couple of weeks.  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  She is on Lomotil as needed.    Subjective:  HPI:  See Assessment / plan for status of chronic conditions. Patient here today with concern for urinary tract infection.  Symptoms started about a week ago. She is having burning and frequency. Her last urinary tract infection was about a year ago. Feels similar to prior urinary tract infection. Notfevers or chills.    She had some issues with his ears popping.  This is well for a week or 2.  No pain.  More prominent in the left.  She is worried about earwax buildup.  She also suffered a laceration to distal aspect of left fifth finger a couple days ago.  Was preparing coleslaw at home when the knife slipped and caught in her finger.  She had a lot of bleeding to the area.  Bleeding has stopped though wound is still open        Objective:  Physical Exam: BP 96/60   Pulse (!) 105   Temp 98.2 F (36.8 C) (Temporal)   Ht 5\' 7"  (1.702 m)   Wt 142 lb (64.4 kg)   SpO2 96%   BMI 22.24 kg/m   Gen: No acute distress,  resting comfortably CV: Regular rate and rhythm with no murmurs appreciated Pulm: Normal work of breathing, clear to auscultation bilaterally with no crackles, wheezes, or rhonchi MUSCULOSKELETAL: Left fifth digit with approximately 1 cm laceration along distal aspect.  No surrounding erythema or purulence.  No drainage.  Neurovascular intact distally. Neuro: Grossly normal, moves all extremities Psych: Normal affect and thought content      Malerie Eakins M. Jimmey Ralph, MD 11/15/2022 1:31 PM

## 2022-11-16 ENCOUNTER — Ambulatory Visit: Admit: 2022-11-16 | Payer: BLUE CROSS/BLUE SHIELD | Attending: Geriatric Medicine | Primary: Internal Medicine

## 2022-11-16 DIAGNOSIS — R419 Unspecified symptoms and signs involving cognitive functions and awareness: Secondary | ICD-10-CM

## 2022-11-16 DIAGNOSIS — F419 Anxiety disorder, unspecified: Secondary | ICD-10-CM

## 2022-11-16 DIAGNOSIS — H9190 Unspecified hearing loss, unspecified ear: Secondary | ICD-10-CM

## 2022-11-16 DIAGNOSIS — R634 Abnormal weight loss: Secondary | ICD-10-CM

## 2022-11-16 DIAGNOSIS — Z72820 Sleep deprivation: Secondary | ICD-10-CM

## 2022-11-16 LAB — URINE CULTURE
MICRO NUMBER:: 15355855
SPECIMEN QUALITY:: ADEQUATE

## 2022-11-16 MED ORDER — HYDROXYZINE HCL 25 MG TABLET
25 | Freq: Three times a day (TID) | ORAL | Status: AC | PRN
Start: 2022-11-16 — End: ?

## 2022-11-16 MED ORDER — TRAMADOL 50 MG TABLET
50 | ORAL | Status: AC
Start: 2022-11-16 — End: ?

## 2022-11-17 ENCOUNTER — Encounter: Admit: 2022-11-17 | Payer: PRIVATE HEALTH INSURANCE | Attending: Geriatric Medicine | Primary: Internal Medicine

## 2022-11-17 DIAGNOSIS — R413 Other amnesia: Secondary | ICD-10-CM

## 2022-11-17 DIAGNOSIS — J449 Chronic obstructive pulmonary disease, unspecified: Secondary | ICD-10-CM

## 2022-11-17 DIAGNOSIS — Z720 Tobacco use: Secondary | ICD-10-CM

## 2022-11-17 DIAGNOSIS — B1920 Unspecified viral hepatitis C without hepatic coma: Secondary | ICD-10-CM

## 2022-11-17 DIAGNOSIS — G4700 Insomnia, unspecified: Secondary | ICD-10-CM

## 2022-11-17 DIAGNOSIS — F419 Anxiety disorder, unspecified: Secondary | ICD-10-CM

## 2022-11-17 DIAGNOSIS — F3289 Other specified depressive episodes: Secondary | ICD-10-CM

## 2022-11-17 DIAGNOSIS — K529 Noninfective gastroenteritis and colitis, unspecified: Secondary | ICD-10-CM

## 2022-11-17 DIAGNOSIS — R4189 Other symptoms and signs involving cognitive functions and awareness: Secondary | ICD-10-CM

## 2022-11-17 DIAGNOSIS — F32A Depression: Secondary | ICD-10-CM

## 2022-11-17 DIAGNOSIS — C3490 Malignant neoplasm of unspecified part of unspecified bronchus or lung: Secondary | ICD-10-CM

## 2022-11-17 NOTE — Progress Notes
[facility] Geriatric Assessment CenterNew VisitPatient Name:  Courtney Knox:  Jul 26, 1957MRUN:  [id number] Date of Service:  11/16/2022 WGN:FAOZH Courtney Knox is an 67 y.o. female being seen in the [facility] for Cognitive Changes and Memory Loss.  She is married and lives with spouse.  Please see case management collateral below.  Spouse presented [facility] with patient.  He states that health insurance is not a concern.  Also stated that they are able to for medications that are prescribed.  He indicated that wife can be argumentative.  She seems to get perseverative and stuck on certain subjects such as needing money for medications and healthcare.  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       Collateral history:  11/10/2022 Nursing Collateral Collateral Source Courtney Knox (daughter) Nursing Collateral  I spoke to [name] on 8/15 who reports that Courtney Knox has had cognitive changes since started chemo a few months ago. Courtney Knox was diagnosed with lung cancer in January 2024 and subsequently started on chemotherapy and radiation treatment which was stopped in July 2024. Prior to that, Courtney Knox's cognition was normal and not a concern. Courtney Knox now is often confused, disoriented, forgetful, confabulates, struggles with decision making, is obsessive and perseverative, and has been making big purchases that are not needed (e.g. buying a refrigerator). Courtney Knox's mood is described as angry, aggressive, hostile, and negative. Courtney Knox's mother and a few of her mother's siblings (unsure how many) were diagnosed with dementia. Courtney Knox's family was told by oncology that the chemo and radiation are not contributors to the sudden change in cognition; therefore, the family would like Courtney Knox to do a cognitive evaluation and possibly give a diagnosis.   Synopsis SmartLink 11/10/2022  12:26 11/04/2022  10:22 ADLS and IADLS Feeding Without difficulty or help Without difficulty or help Dressing Without difficulty or help Without difficulty or help Hygiene Without difficulty or help Without difficulty or help Walking Without difficulty or help Without difficulty or help Transferring Without difficulty or help Without difficulty or help Showering Without difficulty or help Without difficulty or help Toileting Without difficulty or help Without difficulty or help Telephone With difficulty or some help With difficulty or some help Transportation  Unable to assess Shopping With difficulty or some help With difficulty or some help Cooking With difficulty or some help With difficulty or some help Housework With difficulty or some help With difficulty or some help Medications Without difficulty or help Without difficulty or help Finances Completely unable Completely unable Laundry Without difficulty or help Without difficulty or help  History of problem from Patient/Review of Systems:The patient says she is here because not sure. She hopes that today's visit will not sure. Offers she can't remember things. Not sure for how long. Forgets everything. Then talks about not being able to get her medication for her hepatitis C. She says her mood is frightened'. All the reasons she has talked about has left everyone with nothing. Spends her days walking back and forth trying to figure out what she should do. She says she  is depressed due to her decline in health. She says she was prescribed a medication for her mood but didn't take as it made her scared. Makes it worse.  When asked to provide detail and explain her statements as far as being scared and medication making things worse she becomes frustrated and stopped talking.  Also if few points during interview patient became quite animated and agitated.  Has not tried talk therapy. Feels she has bad health, is kind of at the end. Wonders why she should keep going and that her health is so bad she should just stop.  She says that she would like to stop and talked about being tired of additional testing, additional medications, and additional diagnoses.Courtney Knox was fixated on finding her distance glasses and the case for them.  Courtney Knox was also fixated on not having enough money to pay for medications or for care going forward.  She stated that others will not allow her to stop care.  When asked by this author to explain who the others are she stated everyone.  And then she clarify would be her husband and daughter and that her oncologist is pushing her to get treatment.She has pain all over.  When asked to clarify she was unable to indicate exact location of her pain.  Just all over.  Became frustrated with this question as well.  Raises her voice and indicates to examiner can't you tell that I am in pain. She says that she can't answer what matters most.  She did make several statements indicating that she is tired of getting treatment, exhausted by all of the doctors appointments, and is aware that her body is failing her.  She has significant concerns about what she is leaving for her family.  As best as could be determined appears this is both concrete assets as well as her not being alive as her children age.  She appeared open to hospice referral when discussed with her.Geriatric Review of Systems: Synopsis SmartLink 11/10/2022  12:26 11/04/2022  10:22 Geriatric ROS Driving No No Other safety concerns  Unable to assess Has the patient lost more than 10 pounds in the past 6 months?  Yes Sleep problems  Yes she has trouble sleeping Urinary incontinence  No Falls  No Is there anyone who provides care or help in the home?  No Review of Systems Constitutional:  Positive for malaise/fatigue.      Poor appetite.  HENT:  Positive for hearing loss.  Eyes:  Positive for blurred vision. Respiratory:  Positive for cough and shortness of breath.  Cardiovascular: Negative.  Gastrointestinal:  Positive for constipation and diarrhea. Genitourinary:  Positive for frequency and urgency. Musculoskeletal:  Negative for myalgias. Neurological:  Positive for dizziness and weakness. Psychiatric/Behavioral:  Positive for depression and memory loss. The patient is nervous/anxious and has insomnia.    Current Outpatient Medications Medication Sig  Epclusa Take 1 tablet by mouth daily.  famotidine Take 1 tablet (20 mg total) by mouth daily.  hydrOXYzine Take 1 tablet (25 mg total) by mouth 3 (three) times daily as needed for itching.  Ensure Take 296 mLs by mouth 3 (three) times daily with meals.  prochlorperazine Take 1 tablet (10 mg total) by mouth every 6 (six) hours as needed (for nausea or vomiting).  sertraline Take 1 tablet (25 mg total) by mouth daily.  traMADoL Take 1 tablet (50 mg total) by mouth.  b complex vitamins Take 1 tablet by mouth daily. (Patient not taking:  Reported on 10/28/2022)  calcium carbonate/vitamin D3 (CALCIUM WITH VITAMIN D3 ORAL) Take 1 tablet by mouth daily. (Patient not taking: Reported on 10/28/2022) No current facility-administered medications for this visit.  Patient's allergies, problem list, past medical, surgical, social and family histories were reviewed and updated as appropriate.Physical Exam:Physical ExamVitals and nursing note reviewed. Constitutional:     General: She is not in acute distress.   Appearance: She is ill-appearing.    Comments: Cachectic and thin.  Ashen in appearance.  She needed to pause often to catch her breath especially after becoming angry or distressed. HENT:    Head: Normocephalic and atraumatic.    Right Ear: There is no impacted cerumen.    Left Ear: There is no impacted cerumen.    Ears:    Comments: She did struggle at times to hear   Mouth/Throat:    Mouth: Mucous membranes are dry. Eyes:    Extraocular Movements: Extraocular movements intact. Cardiovascular:    Rate and Rhythm: Regular rhythm. Tachycardia present. Pulmonary:    Effort: No respiratory distress.    Comments: Appeared short of breath pursed lip breathing after becoming anxious.  Also after getting up and moving about.Abdominal:    General: There is no distension.    Tenderness: There is no abdominal tenderness. Musculoskeletal:       General: No swelling or tenderness.    Right lower leg: No edema.    Left lower leg: No edema. Skin:   General: Skin is warm and dry. Neurological:    Mental Status: She is alert.    Cranial Nerves: No dysarthria.    Motor: No tremor or abnormal muscle tone.    Coordination: Coordination normal.    Comments: She seems slightly unsteady on her feet.  She did have psychomotor agitation.  There is weakness noted throughout upper and lower extremities.  Some lack of cooperation with neurological exam. Psychiatric:       Attention and Perception: Attention normal.       Mood and Affect: Mood is anxious and depressed. Affect is blunt and angry.       Speech: Speech is delayed.       Behavior: Behavior is agitated. Behavior is cooperative.       Thought Content: Thought content is delusional.       Cognition and Memory: She exhibits impaired recent memory.       Judgment: Judgment is impulsive. Judgment is not inappropriate.    Comments: Was cooperative although was easily agitated.  She also would at times be set off by certain questions.Especially when she was asked to explain or clarify some of the statements she made regarding her health and how she feels she is doing.  Made several statements regarding not having enough money for the insurance.  Author's edit husband states they have very good insurance.  And that having assistance/ care in the home would not be a financial burden.  She appears to have some questionable delusions about finances she is also quite tangential.  Is very hard to get the patient to focus and answer questions clearly and succinctly.  Often begins talking about something unrelated to the question.  She was attentive but does need often redirection/ reminders to answer questions that were asked. She would not answer questions regarding mood.  Short and angry when asked specific yes no questions.  Kept indicating that this Thereasa Parkin should know how she is just by looking at her.        Vision  and Hearing Test:  11/16/2022 Hearing & Vision Far vision both eyes 20/50 Hearing aid has no hearing aids Hearing right ear 1 out of 4 at testing level of 40 db HL Hearing left ear 0 out of 4 at testing level of 40 db HL Vital Signs:  11/16/2022 Vitals Pulse 75  84 BP 103/68  105/66 NIBP MAP (mmHg) (calculated/READ ONLY) 80  79 BP Location Left arm  Left arm Patient Position Standing  Sitting Weight 88 lb Wt Readings from Last 3 Encounters: 11/16/22 39.9 kg 11/11/22 40.3 kg 11/08/22 41.3 kg  Cognitive Testing:  11/04/2022 10/04/2022 08/05/2022 06/02/2022 [facility] GERIATRICS COGNITIVE TESTING FOR NOTE PHQ-2 6 0 0 1 	  11/16/2022 Amb MOCA Trails (1) 0 Copy Shape (1) 0 Clock Drawing (3) 1 Naming (3) 3 Delayed Recall (No Cue) (5) 1 Delayed Recall (Multple Choice) (no score) 0 Forward Digit Span (1) 1 Backward Digit Span (1) 0 Vigilance A Test (1) 1 Serial 7s (3) 2 Repetition (2) 0 Abstraction (2) 2 Fluency (1) 0 Orientation (6) 5 <= 12 year education 1 Total Moca Score (30) 17 Labs: Synopsis SmartLink Latest Ref Rng & Units Most Recent Value  Past ~2 years 10/04/2022  06:43 10/03/2022  13:43 09/26/2022  09:21 09/20/2022  09:57 Labs to review Albumin 3.6 - 5.1 g/dL 1.61/0/9604  3.8  4.1  3.6  Vitamin B12 232 - 1,245 pg/mL 5457/11/2022 545     Thyroid Stimulating Hormone See Comment ?IU/mL 0.4737/11/2022 0.473     Creatinine 0.40 - 1.30 mg/dL 5.409/10/1189 4.78  2.95  0.45  0.45  Imaging:10/28/2022: 	Concern for CNS mets. NSCLC with worsening memory, anxiety and agitation.There is no acute infarct. 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  No extra-axial collection is seen.The pituitary, pineal gland, craniocervical junction, and upper cervical spinal cord are normal in configuration.The visualized orbits are unremarkable.Are all widely there is mild ethmoid apical thickening. The other visualized paranasal sinuses and mastoid air cells are clear. Normal osseous signal is seen.IMPRESSION:No evidence of intracranial metastatic disease or other acute abnormality.Impression/Plan:Waverley Hemple is a 67 y.o. year old female with Moca and collateral consistent with cognitive changes.  Recent MRI that was not consistent with metastases as a concern for changes to her cognitive ability.  Do suspect she has a component of cognitive deficit/potentially disorder.  What is most pressing on examination is that she expressed significant anxiety, psychogenic distress, and depression.  Long discussion with patient and spouse as well as a follow-up call to daughter regarding this issue.  For this Thereasa Parkin the primary concern would be stabilization of her mood and behaviors.  Reviewed with patient and spouse the use of sertraline.  Mirtazapine is also an option as this would help with sleep, mood, and appetite stimulation.  According to the daughter the patient has always been a slight woman.  But daughter [name] does recognize that mother has had weight loss.  Shared decision that patient would not need to return to [facility].  This Thereasa Parkin is uncertain if memory medication would be of benefit.  Additionally unclear the patient would comply with further diagnostic testing regarding memory loss.  And as stated previously mood stabilization and improvement in mood is the priority.  May have pain.  Reported poor sleep.# neurocognitive deficits/disorder# mood lability# anxiety/depression# psychogenic distress.# weight loss# poor sleep# pain# hearing loss#  goals of careWith regards to neurocognitive deficits/disorder they are present.  But is very challenging to tease out exact baseline as her mood lability is a significant factor in appropriate assessment.  Etiology is also unclear.  But does not appear to be metastases.   Because of this deficit medical providers will probably need to involve family members in future healthcare decisions. Encouraged patient and family members to review a living well.  Encouraged formalization of a healthcare proxy.  This Thereasa Parkin also sent epic message to her oncologist regarding patient's statements made today at [facility].  Defer further discussion about goals of care and future care plans to her family and oncologist.  Received message that is as follows per Oncology: technically received curative cancer treatment. We could discuss not proceeding with any surveillance/palliative care moving forward if that is what she is interested in.  For this Thereasa Parkin she did state that she was interested in hospice. If patient agrees consider referral to [facility]NAG[facility].Social work is available through the [employer] in asked patient and family to please access this professional service.Strongly encouraged patient to restart sertraline.  As this would be very helpful for mood and anxiety.  Also encouraged her to engage in talk therapy.  She was not interested in a referral for talk therapy.Another option for mood stabilization is mirtazapine.  The use of this medication could aid with the poor sleep and poor appetite that is also being reported.In follow-up call to patient's daughter [name] asked that she and family review goals of care and plan of care.Consider nutrition consult.Consider use of melatonin 3 mg p.o. q.h.s..  Sleep hygiene was reviewed.  Patient did not seem interested any recommendations regarding sleep hygiene.It is unlikely that patient would comply with an audiology assessment and the use of hearing aids.  We will not make a referralMonitor pain. Advance Care Planning Patient has a living will: NoPatient has healthcare representative/proxy: NoPlease enter code [id number] if patient answers Yes or code [id number] if patient answers No to any of the above questions and you are not completing a 30 minute or more Advanced Care Planning encounter          On the day of this patient's encounter, a total of 71 minutes was personally spent by me. This does not include any resident/fellow teaching time, or any time spent performing a procedural service.

## 2022-11-17 NOTE — Patient Instructions
Thank you for coming to [facility] It was a pleasure to care for you.Your score today on cognitive testing was:Total Moca Score (30): 17You can find your follow up appointment in MyChart.Please call or MyChart message the [facility] with any questions or concerns. [phone]

## 2022-11-18 ENCOUNTER — Inpatient Hospital Stay: Admit: 2022-11-18 | Discharge: 2022-11-18 | Payer: BLUE CROSS/BLUE SHIELD | Primary: Internal Medicine

## 2022-11-18 ENCOUNTER — Encounter
Admit: 2022-11-18 | Payer: BLUE CROSS/BLUE SHIELD | Attending: Student in an Organized Health Care Education/Training Program | Primary: Internal Medicine

## 2022-11-18 DIAGNOSIS — C3490 Malignant neoplasm of unspecified part of unspecified bronchus or lung: Secondary | ICD-10-CM

## 2022-11-18 MED ORDER — SODIUM CHLORIDE 0.9 % LARGE VOLUME SYRINGE FOR AUTOINJECTOR
Freq: Once | INTRAVENOUS | Status: CP | PRN
Start: 2022-11-18 — End: ?
  Administered 2022-11-18: 21:00:00 via INTRAVENOUS

## 2022-11-18 MED ORDER — IOHEXOL 350 MG IODINE/ML INTRAVENOUS SOLUTION
350 | Freq: Once | INTRAVENOUS | Status: CP | PRN
Start: 2022-11-18 — End: ?
  Administered 2022-11-18: 20:00:00 350 mL via INTRAVENOUS

## 2022-11-18 NOTE — Progress Notes (Signed)
Urine culture is negative. She should let us know if her symptoms are not improving.  Alyssa Deleon. Jimmey Ralph, MD 11/18/2022 2:37 PM

## 2022-11-19 NOTE — Progress Notes
INITIAL STUDIES:06/25/2022 [place] A/P showed findings concerning for colitis predominant involvement of the right colon and cecum.07/01/2022 [place] chest showed RIGHT upper lobe mass with chest wall invasion. Mass measures 5.1 x 3.3 x 4.9 cm. This mass erodes into the posterior chest wall with adjacent erosion of the RIGHT posterior fifth and sixth ribs.07/06/2022 FEV1 37% predicted, FEV1/FVC 35%, DLCO 20%. Severe obstruction, severely impaired diffusing capacity.07/09/2022 MRI brain with no evidence of metastatic disease to the brain.07/16/2022 FD[facility] PET/[place] showed intensely hypermetabolic RIGHT upper lobe pleural based lung mass with chest wall and right 5-6th ribs invasion. Biapical hypermetabolic scarring with more prominent on the LEFT apical nodularity, nonspecific but concerning for malignant involvement.07/21/22 Path penAccession #: [id number]        1) LYMPH NODE, 11L, FINE NEEDLE ASPIRATION/WASH:           - NEGATIVE FOR MALIGNANT CELLS      - POLYMORPHOUS POPULATION OF LYMPHOCYTES. 2) LYMPH NODE, STATION 7, FINE NEEDLE ASPIRATION/WASH:           - NEGATIVE FOR MALIGNANT CELLS      - POLYMORPHOUS POPULATION OF LYMPHOCYTES. 3) LYMPH NODE, 11R, FINE NEEDLE ASPIRATION/WASH:           - NEGATIVE FOR MALIGNANT CELLS      - POLYMORPHOUS POPULATION OF LYMPHOCYTES. PAST MEDICAL/ SURGICAL HISTORY:  Colitis, COPD (chronic obstructive pulmonary disease), osteoporosis. Left wrist surgery. 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 Continues to smoke with roughly 40 pack year history.FAMILY HISTORY:  Father, lung cancer; brother, lymphomaMEDICATIONS:  Medication list reviewedALLERGIES:  PCNREVIEW OF SYSTEMS:  As described below and in chart.  Remaining review of systems is negative.Courtney Knox appears well. Irritable. Unable to take accurate history considering psychiatric issues. Husband accompanies her today. Doesn?t appear to be in any distress.Etiology of mental status changes still uncertain. Work up during and after hospitalization unrevealing. Scheduled for further evaluation at the [facility].Concerning her lung cancer, she has completed definitive radiation, although only received roughly half the concurrent chemotherapy portion due to refusal.Options at this point  include platinum double chemotherapy followed by immunotherapy or immunotherapy alone. Courtney Knox is unable to decide on treatment and we agreed to start with a [place] to assess disease status. She will return to clinical week after [place].

## 2022-11-20 ENCOUNTER — Other Ambulatory Visit: Payer: Self-pay | Admitting: Oncology

## 2022-11-22 ENCOUNTER — Emergency Department (HOSPITAL_BASED_OUTPATIENT_CLINIC_OR_DEPARTMENT_OTHER): Payer: Medicare Other

## 2022-11-22 ENCOUNTER — Encounter: Payer: Medicare Other | Admitting: Physical Therapy

## 2022-11-22 ENCOUNTER — Emergency Department (HOSPITAL_BASED_OUTPATIENT_CLINIC_OR_DEPARTMENT_OTHER)
Admission: EM | Admit: 2022-11-22 | Discharge: 2022-11-22 | Disposition: A | Discharge status: Home / Self Care | Payer: Medicare Other | Attending: Emergency Medicine | Admitting: Emergency Medicine

## 2022-11-22 ENCOUNTER — Emergency Department (HOSPITAL_BASED_OUTPATIENT_CLINIC_OR_DEPARTMENT_OTHER): Payer: Medicare Other | Admitting: Radiology

## 2022-11-22 ENCOUNTER — Other Ambulatory Visit: Payer: Self-pay

## 2022-11-22 ENCOUNTER — Encounter (HOSPITAL_BASED_OUTPATIENT_CLINIC_OR_DEPARTMENT_OTHER): Payer: Self-pay | Admitting: Emergency Medicine

## 2022-11-22 ENCOUNTER — Ambulatory Visit: Admit: 2022-11-22 | Payer: BLUE CROSS/BLUE SHIELD | Attending: Hematology | Primary: Internal Medicine

## 2022-11-22 ENCOUNTER — Telehealth: Admit: 2022-11-22 | Payer: PRIVATE HEALTH INSURANCE | Attending: Hematology | Primary: Internal Medicine

## 2022-11-22 DIAGNOSIS — S0083XA Contusion of other part of head, initial encounter: Secondary | ICD-10-CM | POA: Diagnosis not present

## 2022-11-22 DIAGNOSIS — S40021A Contusion of right upper arm, initial encounter: Secondary | ICD-10-CM | POA: Insufficient documentation

## 2022-11-22 DIAGNOSIS — W19XXXA Unspecified fall, initial encounter: Secondary | ICD-10-CM | POA: Diagnosis not present

## 2022-11-22 DIAGNOSIS — S82891A Other fracture of right lower leg, initial encounter for closed fracture: Secondary | ICD-10-CM

## 2022-11-22 DIAGNOSIS — S40022A Contusion of left upper arm, initial encounter: Secondary | ICD-10-CM | POA: Diagnosis not present

## 2022-11-22 DIAGNOSIS — Z85048 Personal history of other malignant neoplasm of rectum, rectosigmoid junction, and anus: Secondary | ICD-10-CM | POA: Diagnosis not present

## 2022-11-22 DIAGNOSIS — S82431A Displaced oblique fracture of shaft of right fibula, initial encounter for closed fracture: Secondary | ICD-10-CM | POA: Insufficient documentation

## 2022-11-22 DIAGNOSIS — M7989 Other specified soft tissue disorders: Secondary | ICD-10-CM

## 2022-11-22 DIAGNOSIS — S8992XA Unspecified injury of left lower leg, initial encounter: Secondary | ICD-10-CM | POA: Diagnosis present

## 2022-11-22 LAB — CBC WITH DIFFERENTIAL/PLATELET
Abs Immature Granulocytes: 0.09 10*3/uL — ABNORMAL HIGH (ref 0.00–0.07)
Basophils Absolute: 0 10*3/uL (ref 0.0–0.1)
Basophils Relative: 0 %
Eosinophils Absolute: 0 10*3/uL (ref 0.0–0.5)
Eosinophils Relative: 0 %
HCT: 28.7 % — ABNORMAL LOW (ref 36.0–46.0)
Hemoglobin: 9.2 g/dL — ABNORMAL LOW (ref 12.0–15.0)
Immature Granulocytes: 1 %
Lymphocytes Relative: 6 %
Lymphs Abs: 0.4 10*3/uL — ABNORMAL LOW (ref 0.7–4.0)
MCH: 31.1 pg (ref 26.0–34.0)
MCHC: 32.1 g/dL (ref 30.0–36.0)
MCV: 97 fL (ref 80.0–100.0)
Monocytes Absolute: 0.6 10*3/uL (ref 0.1–1.0)
Monocytes Relative: 8 %
Neutro Abs: 6.2 10*3/uL (ref 1.7–7.7)
Neutrophils Relative %: 85 %
Platelets: 230 10*3/uL (ref 150–400)
RBC: 2.96 MIL/uL — ABNORMAL LOW (ref 3.87–5.11)
RDW: 13 % (ref 11.5–15.5)
WBC: 7.3 10*3/uL (ref 4.0–10.5)
nRBC: 0 % (ref 0.0–0.2)

## 2022-11-22 LAB — COMPREHENSIVE METABOLIC PANEL
ALT: 9 U/L (ref 0–44)
AST: 12 U/L — ABNORMAL LOW (ref 15–41)
Albumin: 3.8 g/dL (ref 3.5–5.0)
Alkaline Phosphatase: 56 U/L (ref 38–126)
Anion gap: 10 (ref 5–15)
BUN: 18 mg/dL (ref 8–23)
CO2: 29 mmol/L (ref 22–32)
Calcium: 9.4 mg/dL (ref 8.9–10.3)
Chloride: 100 mmol/L (ref 98–111)
Creatinine, Ser: 0.71 mg/dL (ref 0.44–1.00)
GFR, Estimated: >60 mL/min (ref >60)
Glucose, Bld: 125 mg/dL — ABNORMAL HIGH (ref 70–99)
Potassium: 3.5 mmol/L (ref 3.5–5.1)
Sodium: 139 mmol/L (ref 135–145)
Total Bilirubin: 0.7 mg/dL (ref 0.3–1.2)
Total Protein: 6.6 g/dL (ref 6.5–8.1)

## 2022-11-22 LAB — TROPONIN I (HIGH SENSITIVITY)
Troponin I (High Sensitivity): 4 ng/L (ref <18)
Troponin I (High Sensitivity): 3 ng/L (ref <18)

## 2022-11-22 MED ORDER — ENOXAPARIN SODIUM 80 MG/0.8ML IJ SOSY
1.0000 mg/kg | PREFILLED_SYRINGE | Freq: Once | INTRAMUSCULAR | Status: AC
  Administered 2022-11-22: 65 mg via SUBCUTANEOUS
  Filled 2022-11-22: qty 0.8

## 2022-11-22 MED ORDER — OXYCODONE-ACETAMINOPHEN 5-325 MG PO TABS
2.0000 | ORAL_TABLET | Freq: Once | ORAL | Status: AC
  Administered 2022-11-22: 2 via ORAL
  Filled 2022-11-22: qty 2

## 2022-11-22 MED ORDER — OXYCODONE-ACETAMINOPHEN 5-325 MG PO TABS
2.0000 | ORAL_TABLET | Freq: Four times a day (QID) | ORAL | 0 refills | Status: DC | PRN
Start: 2022-11-22 — End: 2022-11-30
  Filled 2022-11-22: qty 20, 3d supply, fill #0

## 2022-11-22 MED ORDER — HYDROCODONE-ACETAMINOPHEN 5-325 MG PO TABS
2.0000 | ORAL_TABLET | Freq: Once | ORAL | Status: DC
  Filled 2022-11-22: qty 2

## 2022-11-22 MED ORDER — IOHEXOL 350 MG/ML SOLN
100.0000 mL | Freq: Once | INTRAVENOUS | Status: AC | PRN
  Administered 2022-11-22: 75 mL via INTRAVENOUS

## 2022-11-22 MED ORDER — OXYCODONE-ACETAMINOPHEN 5-325 MG PO TABS: 2.0000 | ORAL_TABLET | Freq: Four times a day (QID) | ORAL | 0 refills | Status: DC | PRN

## 2022-11-22 NOTE — ED Provider Triage Note (Signed)
Emergency Medicine Provider Triage Evaluation Note  Alyssa Deleon , a 67 y.o. female  was evaluated in triage.  Pt complains of  fell 3 days ago. Thinks she .might have passed out and hit her chin. R leg is very swollen. Able to ambulate with walker.  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  Medical Decision Making  Medically screening exam initiated at 5:37 PM.  Appropriate orders placed.  Waylan Boga was informed that the remainder of the evaluation will be completed by another provider, this initial triage assessment does not replace that evaluation, and the importance of remaining in the ED until their evaluation is complete.     Arthor Captain, PA-C 11/22/22 1742

## 2022-11-22 NOTE — ED Provider Notes (Signed)
[place] EMERGENCY DEPARTMENT AT [facility] Provider Note   CSN: [id number] Arrival date & time: 11/22/22  1716     History  Chief Complaint  Patient presents with   Alyssa Deleon is a 67 y.o. female with a past medical history of metastatic rectal cancer involving the liver who presents to the emergency department with chief complaint of fall.  Patient states she is not sure but she might of passed out she hit her chin.  She has bruising and pain to both legs but has been able to walk with a walker.  She is not on a blood thinner.     Fall       Home Medications Prior to Admission medications   Medication Sig Start Date End Date Taking? Authorizing Provider  ALPRAZolam (XANAX) 1 MG tablet TAKE 2 TABLETS BY MOUTH EVERY  NIGHT AT BEDTIME AS NEEDED FOR  ANXIETY 09/20/22   Allwardt, Crist Infante, PA-C  Budeson-Glycopyrrol-Formoterol (BREZTRI AEROSPHERE) 160-9-4.8 MCG/ACT AERO Inhale 2 puffs into the lungs in the morning and at bedtime. Rinse mouth after use.  DAILY USE FOR COPD MAINTENANCE TREATMENT. 10/27/22   Allwardt, Arlyss Repress M, PA-C  buPROPion (WELLBUTRIN XL) 300 MG 24 hr tablet TAKE 1 TABLET BY MOUTH DAILY 03/18/22   Allwardt, Crist Infante, PA-C  Calcium Carbonate-Vitamin D 600-5 MG-MCG CAPS Take by mouth.    [provider]  denosumab (PROLIA) 60 MG/ML SOSY injection Inject 60 mg into the skin every 6 (six) months.    [provider]  diphenoxylate-atropine (LOMOTIL) 2.5-0.025 MG tablet Take 1 tablet by mouth 4 (four) times daily as needed for diarrhea or loose stools. 07/11/22   Ronny Bacon, PA-C  DULoxetine (CYMBALTA) 60 MG capsule TAKE 1 CAPSULE BY MOUTH DAILY 03/18/22   Allwardt, Crist Infante, PA-C  ezetimibe (ZETIA) 10 MG tablet Take 1 tablet (10 mg total) by mouth daily. 04/21/22   Allwardt, Crist Infante, PA-C  gabapentin (NEURONTIN) 300 MG capsule TAKE 1 CAPSULE BY MOUTH TWICE  DAILY 10/10/22   Allwardt, Alyssa M, PA-C  loperamide (IMODIUM  A-D) 2 MG tablet Take 2 mg by mouth 4 (four) times daily.    [provider]  LORazepam (ATIVAN) 0.5 MG tablet Take 1 tablet by mouth or dissolve q 4-6 hours as needed for nausea if Zofran/Compazine are not working. Do not take Xanax within 4 hours. 07/11/22   Ronny Bacon, PA-C  magic mouthwash (nystatin, diphenhydrAMINE, alum & mag hydroxide) suspension mixture Swish and spit 5 mL by mouth as needed for mouth pain. 06/06/22     magic mouthwash (nystatin, diphenhydrAMINE, alum & mag hydroxide) suspension mixture Swish and spit 5 mLs by mouth 4 (four) times daily as needed. 10/27/22     nitrofurantoin, macrocrystal-monohydrate, (MACROBID) 100 MG capsule Take 1 capsule (100 mg total) by mouth 2 (two) times daily. 11/15/22   Ardith Dark, MD  ondansetron (ZOFRAN) 8 MG tablet Take 1 tablet (8 mg total) by mouth every 8 (eight) hours as needed for nausea or vomiting. 06/03/22   Ladene Artist, MD  oxyCODONE-acetaminophen (PERCOCET) 5-325 MG tablet Take 2 tablets by mouth every 6 (six) hours as needed. 11/22/22   Arthor Captain, PA-C  prochlorperazine (COMPAZINE) 10 MG tablet Take 1 tablet (10 mg total) by mouth every 6 (six) hours as needed for nausea or vomiting. 06/03/22   Ladene Artist, MD  simvastatin (ZOCOR) 40 MG tablet TAKE 1 TABLET BY MOUTH DAILY 01/24/22  Allwardt, Alyssa M, PA-C  traMADol (ULTRAM) 50 MG tablet Take 1 tablet (50 mg total) by mouth every 6 (six) hours as needed for severe pain. 11/03/22   Ladene Artist, MD  traMADol (ULTRAM) 50 MG tablet Take 1 tablet (50 mg total) by mouth every 6 (six) hours as needed for severe pain 11/03/22     traZODone (DESYREL) 150 MG tablet Take 1 tablet (150 mg total) by mouth at bedtime. 09/14/22   Allwardt, Alyssa M, PA-C  TYRVAYA 0.03 MG/ACT SOLN Place 1 spray into both nostrils 2 (two) times daily. 02/22/22   [provider]      Allergies    Hydrocodone-acetaminophen, Klonopin [clonazepam], Morphine sulfate, Rosuvastatin,  Venlafaxine, Grass pollen(k-o-r-t-swt vern), Seasonal ic [cholestatin], Ezetimibe-simvastatin, and Sulfa antibiotics    Review of Systems   Review of Systems  Physical Exam Updated Vital Signs BP 108/66   Pulse 98   Temp 98 F (36.7 C)   Resp 19   Ht 5\' 7"  (1.702 m)   Wt 64.4 kg   SpO2 98%   BMI 22.24 kg/m  Physical Exam Vitals and nursing note reviewed.  Constitutional:      General: She is not in acute distress.    Appearance: She is well-developed. She is not diaphoretic.  HENT:     Head: Normocephalic.     Comments: Bruising to the submental region, small abrasion    Right Ear: External ear normal.     Left Ear: External ear normal.     Nose: Nose normal.     Mouth/Throat:     Mouth: Mucous membranes are moist.  Eyes:     General: No scleral icterus.    Conjunctiva/sclera: Conjunctivae normal.  Cardiovascular:     Rate and Rhythm: Normal rate and regular rhythm.     Heart sounds: Normal heart sounds. No murmur heard.    No friction rub. No gallop.  Pulmonary:     Effort: Pulmonary effort is normal. No respiratory distress.     Breath sounds: Normal breath sounds.  Abdominal:     General: Bowel sounds are normal. There is no distension.     Palpations: Abdomen is soft. There is no mass.     Tenderness: There is no abdominal tenderness. There is no guarding.  Musculoskeletal:     Cervical back: Normal range of motion.     Comments: Bruising to the bilateral upper extremities, full motion without pain or deformity.  Bilateral lower extremities with bruising and swelling in the feet.  Left foot and ankle examination shows bruising and swelling without significant pain to palpation or limited range of motion of the left lower extremity.  Right lower extremity is exquisitely tender along the tarsal phalangeal border and along the lateral malleolus.  The range of motion is limited.  The right lower extremity is also significantly more swollen and warm comparatively to the  left.  DP and PT pulse palpable bilaterally  Skin:    General: Skin is warm and dry.  Neurological:     Mental Status: She is alert and oriented to person, place, and time.  Psychiatric:        Behavior: Behavior normal.     ED Results / Procedures / Treatments   Labs (all labs ordered are listed, but only abnormal results are displayed) Labs Reviewed  COMPREHENSIVE METABOLIC PANEL - Abnormal; Notable for the following components:      Result Value   Glucose, Bld 125 (*)    AST  12 (*)    All other components within normal limits  CBC WITH DIFFERENTIAL/PLATELET - Abnormal; Notable for the following components:   RBC 2.96 (*)    Hemoglobin 9.2 (*)    HCT 28.7 (*)    Lymphs Abs 0.4 (*)    Abs Immature Granulocytes 0.09 (*)    All other components within normal limits  TROPONIN I (HIGH SENSITIVITY)  TROPONIN I (HIGH SENSITIVITY)    EKG EKG Interpretation Date/Time:  Tuesday November 22 2022 17:43:41 EDT Ventricular Rate:  120 PR Interval:  120 QRS Duration:  68 QT Interval:  418 QTC Calculation: 590 R Axis:   26  Text Interpretation: Sinus tachycardia Cannot rule out Anterior infarct , age undetermined ST & T wave abnormality, consider inferolateral ischemia Prolonged QT Abnormal ECG When compared with ECG of 09-Sep-2022 17:25, PREVIOUS ECG IS PRESENT Confirmed by Alvester Chou [phone]) on 11/22/2022 7:07:01 PM  Radiology CT Angio Chest PE W and/or Wo Contrast  Result Date: 11/22/2022 CLINICAL DATA:  History of breast cancer, presenting with recent fall. EXAM: CT ANGIOGRAPHY CHEST WITH CONTRAST TECHNIQUE: Multidetector CT imaging of the chest was performed using the standard protocol during bolus administration of intravenous contrast. Multiplanar CT image reconstructions and MIPs were obtained to evaluate the vascular anatomy. RADIATION DOSE REDUCTION: This exam was performed according to the departmental dose-optimization program which includes automated exposure control,  adjustment of the mA and/or kV according to patient size and/or use of iterative reconstruction technique. CONTRAST:  75mL OMNIPAQUE IOHEXOL 350 MG/ML SOLN COMPARISON:  May 12, 2022 FINDINGS: Cardiovascular: A right-sided venous Port-A-Cath is in place. There is moderate severity calcification of the aortic arch, without evidence of aortic aneurysm or dissection. Satisfactory opacification of the pulmonary arteries to the segmental level. No evidence of pulmonary embolism. Normal heart size. No pericardial effusion. Mediastinum/Nodes: No enlarged mediastinal, hilar, or axillary lymph nodes. Thyroid gland, trachea, and esophagus demonstrate no significant findings. Lungs/Pleura: There is marked severity bilateral upper lobe centrilobular and emphysematous lung disease. Stable moderate to marked severity biapical pleural scarring and/or atelectasis is noted. A no 5 mm pleural based noncalcified lung nodule is seen within the posterolateral aspect of the right lower lobe (axial CT image 52, CT series 6). Pleural based calcifications are again seen within the anterior aspect of the right upper lobe, right middle lobe and posterior right lung base. No pleural effusion or pneumothorax is identified. Upper Abdomen: No acute abnormality. Musculoskeletal: Bilateral breast implants are seen. Chronic compression fracture deformities are seen at the levels of T8, T9 and T12. Review of the MIP images confirms the above findings. IMPRESSION: 1. No evidence of pulmonary embolism. 2. 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 3. Stable moderate to marked severity biapical pleural scarring and/or atelectasis. 4. New 5 mm pleural based right lower lobe lung nodule. Given the patient's history of breast cancer, follow-up PET-CT or tissue sampling is recommended. This recommendation follows the consensus statement: Guidelines for Management of Incidental Pulmonary Nodules Detected on CT  Images: From the Fleischner Society 2017; Radiology 2017; 284:228-243. 5. Chronic compression fracture deformities at the levels of T8, T9 and T12. 6. Aortic atherosclerosis. Aortic Atherosclerosis (ICD10-I70.0) and Emphysema (ICD10-J43.9). Electronically Signed   By: Aram Candela M.D.   On: 11/22/2022 21:01   CT Head Wo Contrast  Result Date: 11/22/2022 CLINICAL DATA:  Fall EXAM: CT HEAD WITHOUT CONTRAST CT CERVICAL SPINE WITHOUT CONTRAST TECHNIQUE: Multidetector CT imaging of the head and cervical spine was performed following  the standard protocol without intravenous contrast. Multiplanar CT image reconstructions of the cervical spine were also generated. RADIATION DOSE REDUCTION: This exam was performed according to the departmental dose-optimization program which includes automated exposure control, adjustment of the mA and/or kV according to patient size and/or use of iterative reconstruction technique. COMPARISON:  01/08/2006 head CT FINDINGS: CT HEAD FINDINGS Brain: There is no mass, hemorrhage or extra-axial collection. There is generalized atrophy without lobar predilection. 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 The brain parenchyma is normal, without evidence of acute or chronic infarction. Vascular: No abnormal hyperdensity of the major intracranial arteries or dural venous sinuses. No intracranial atherosclerosis. Skull: The visualized skull base, calvarium and extracranial soft tissues are normal. Sinuses/Orbits: No fluid levels or advanced mucosal thickening of the visualized paranasal sinuses. No mastoid or middle ear effusion. The orbits are normal. CT CERVICAL SPINE FINDINGS Alignment: Grade 1 anterolisthesis at C4-5, likely due to facet degeneration. Skull base and vertebrae: No acute fracture. Soft tissues and spinal canal: No prevertebral fluid or swelling. No visible canal hematoma. Disc levels: No advanced spinal canal or neural foraminal  stenosis. Upper chest: Biapical emphysema Other: Normal visualized paraspinal cervical soft tissues. IMPRESSION: 1. No acute intracranial abnormality. 2. 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 3. No acute fracture or traumatic subluxation of the cervical spine. Electronically Signed   By: Deatra Robinson M.D.   On: 11/22/2022 19:00   CT Cervical Spine Wo Contrast  Result Date: 11/22/2022 CLINICAL DATA:  Fall EXAM: CT HEAD WITHOUT CONTRAST CT CERVICAL SPINE WITHOUT CONTRAST TECHNIQUE: Multidetector CT imaging of the head and cervical spine was performed following the standard protocol without intravenous contrast. Multiplanar CT image reconstructions of the cervical spine were also generated. RADIATION DOSE REDUCTION: This exam was performed according to the departmental dose-optimization program which includes automated exposure control, adjustment of the mA and/or kV according to patient size and/or use of iterative reconstruction technique. COMPARISON:  01/08/2006 head CT FINDINGS: CT HEAD FINDINGS Brain: There is no mass, hemorrhage or extra-axial collection. There is generalized atrophy without lobar predilection. 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 The brain parenchyma is normal, without evidence of acute or chronic infarction. Vascular: No abnormal hyperdensity of the major intracranial arteries or dural venous sinuses. No intracranial atherosclerosis. Skull: The visualized skull base, calvarium and extracranial soft tissues are normal. Sinuses/Orbits: No fluid levels or advanced mucosal thickening of the visualized paranasal sinuses. No mastoid or middle ear effusion. The orbits are normal. CT CERVICAL SPINE FINDINGS Alignment: Grade 1 anterolisthesis at C4-5, likely due to facet degeneration. Skull base and vertebrae: No acute fracture. Soft tissues and spinal canal: No prevertebral fluid or swelling. No visible canal  hematoma. Disc levels: No advanced spinal canal or neural foraminal stenosis. Upper chest: Biapical emphysema Other: Normal visualized paraspinal cervical soft tissues. IMPRESSION: 1. No acute intracranial abnormality. 2. 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 3. No acute fracture or traumatic subluxation of the cervical spine. Electronically Signed   By: Deatra Robinson M.D.   On: 11/22/2022 19:00   DG Foot Complete Right  Result Date: 11/22/2022 CLINICAL DATA:  Status post fall 3 days ago with swelling and warmth EXAM: RIGHT FOOT COMPLETE - 3 VIEW; RIGHT ANKLE - COMPLETE 3 VIEW COMPARISON:  None Available. FINDINGS: Minimally displaced oblique fracture of the posterior distal fibula with ill-defined ossific fragments project along the lateral aspect of  the distal fibula. Moderate joint effusion. There is no evidence of arthropathy or other focal bone abnormality. Ankle mortise is intact. Soft tissue swelling about the ankle. IMPRESSION: 1. Minimally displaced oblique fracture of the posterior distal fibula. Ankle mortise is intact. 2. Moderate joint effusion and soft tissue swelling about the ankle. Electronically Signed   By: Agustin Cree M.D.   On: 11/22/2022 18:52   DG Ankle Complete Right  Result Date: 11/22/2022 CLINICAL DATA:  Status post fall 3 days ago with swelling and warmth EXAM: RIGHT FOOT COMPLETE - 3 VIEW; RIGHT ANKLE - COMPLETE 3 VIEW COMPARISON:  None Available. FINDINGS: Minimally displaced oblique fracture of the posterior distal fibula with ill-defined ossific fragments project along the lateral aspect of the distal fibula. Moderate joint effusion. There is no evidence of arthropathy or other focal bone abnormality. Ankle mortise is intact. Soft tissue swelling about the ankle. IMPRESSION: 1. Minimally displaced oblique fracture of the posterior distal fibula. Ankle mortise is intact. 2. Moderate joint effusion and soft tissue swelling about the ankle.  Electronically Signed   By: Agustin Cree M.D.   On: 11/22/2022 18:52    Procedures Procedures    Medications Ordered in ED Medications  iohexol (OMNIPAQUE) 350 MG/ML injection 100 mL (75 mLs Intravenous Contrast Given 11/22/22 2035)  enoxaparin (LOVENOX) injection 65 mg (65 mg Subcutaneous Given 11/22/22 2149)  oxyCODONE-acetaminophen (PERCOCET/ROXICET) 5-325 MG per tablet 2 tablet (2 tablets Oral Given 11/22/22 2148)    ED Course/ Medical Decision Making/ A&P                                 Medical Decision Making Amount and/or Complexity of Data Reviewed Labs: ordered. Radiology: ordered.  Risk Prescription drug management.   67 year old female with fall 2 days ago.  I ordered and reviewed labs.  Hemoglobin is 9.2 but stable, troponin within normal limits x 2.  I ordered and reviewed as well as interpreted imaging that included right foot and ankle exam which shows a small but stable fracture of the distal right fibula, CT C-spine and CT head without significant abnormality or acute finding.  Patient CT angiogram and PE study is negative for acute finding including pulmonary embolus. Patient placed in cam walker.  EKG with prolonged QT and ST-T segment changes.  Patient given pain medication, PDMP reviewed.  She will get DVT study tomorrow morning after chemotherapy.  Patient given Lovenox for coverage.  She is comfortable with these plans.  Appropriate for discharge at this time.       Final Clinical Impression(s) / ED Diagnoses Final diagnoses:  Closed fracture of right ankle, initial encounter  Right leg swelling    Rx / DC Orders ED Discharge Orders          Ordered    oxyCODONE-acetaminophen (PERCOCET) 5-325 MG tablet  Every 6 hours PRN,   Status:  Discontinued        11/22/22 2145    oxyCODONE-acetaminophen (PERCOCET) 5-325 MG tablet  Every 6 hours PRN        11/22/22 2146              Arthor Captain, PA-C 11/22/22 2241    Terald Sleeper,  MD 11/22/22 2322

## 2022-11-22 NOTE — ED Notes (Signed)
Patient resting quietly in stretcher, respirations even, unlabored, no acute distress noted. Provided pt with additional warm blankets. Family at bedside. Per provider, will evaluate patient soon, pt made aware of this.

## 2022-11-22 NOTE — ED Triage Notes (Signed)
Pt arrives to ED with c/o fall on 8/24. Pt notes her knees weakened and she fell in the kitchen. She notes LOC and head injury. Hx rectal cancer.

## 2022-11-22 NOTE — ED Notes (Signed)
Reviewed AVS with patient, patient expressed understanding of directions, denies further questions at this time. 

## 2022-11-22 NOTE — Discharge Instructions (Addendum)
Please return tomorrow morning for your chemotherapy.  We will then proceed to getting your DVT study after chemo.  You have been given a single dose of Lovenox tonight which should cover for any potential clot in your lower extremity.  Get help right away if you have any new or worsening symptoms.  The fracture you have in your ankle is stable we will have you follow-up with an orthopedist in the outpatient setting.  Get help right away if you have any new or worsening symptoms including chest pain or shortness of breath.

## 2022-11-22 NOTE — Telephone Encounter
Recvd call from [name].  They missed apt this apt.  Wanted to r/s. Added to next Tuesday as Friday was full.  Will this be ok?  I havent cancelled today yet.

## 2022-11-23 ENCOUNTER — Encounter: Payer: Self-pay | Admitting: Nurse Practitioner

## 2022-11-23 ENCOUNTER — Inpatient Hospital Stay (HOSPITAL_BASED_OUTPATIENT_CLINIC_OR_DEPARTMENT_OTHER): Payer: Medicare Other | Admitting: Nurse Practitioner

## 2022-11-23 ENCOUNTER — Inpatient Hospital Stay: Payer: Medicare Other

## 2022-11-23 ENCOUNTER — Telehealth: Payer: Self-pay | Admitting: Physician Assistant

## 2022-11-23 ENCOUNTER — Other Ambulatory Visit: Payer: Self-pay

## 2022-11-23 ENCOUNTER — Inpatient Hospital Stay: Payer: Medicare Other | Admitting: Nutrition

## 2022-11-23 ENCOUNTER — Other Ambulatory Visit (HOSPITAL_BASED_OUTPATIENT_CLINIC_OR_DEPARTMENT_OTHER): Payer: Self-pay | Admitting: Emergency Medicine

## 2022-11-23 ENCOUNTER — Ambulatory Visit (HOSPITAL_BASED_OUTPATIENT_CLINIC_OR_DEPARTMENT_OTHER)
Admission: RE | Admit: 2022-11-23 | Discharge: 2022-11-23 | Disposition: A | Discharge status: Home / Self Care | Payer: Medicare Other | Source: Ambulatory Visit | Attending: Emergency Medicine | Admitting: Emergency Medicine

## 2022-11-23 ENCOUNTER — Other Ambulatory Visit (HOSPITAL_BASED_OUTPATIENT_CLINIC_OR_DEPARTMENT_OTHER): Payer: Self-pay

## 2022-11-23 VITALS — BP 101/57 | HR 96

## 2022-11-23 VITALS — BP 88/69 | HR 132 | Temp 98.1°F | Resp 18 | Ht 67.0 in | Wt 139.0 lb

## 2022-11-23 DIAGNOSIS — R600 Localized edema: Secondary | ICD-10-CM | POA: Insufficient documentation

## 2022-11-23 DIAGNOSIS — R52 Pain, unspecified: Secondary | ICD-10-CM | POA: Insufficient documentation

## 2022-11-23 DIAGNOSIS — C210 Malignant neoplasm of anus, unspecified: Secondary | ICD-10-CM

## 2022-11-23 DIAGNOSIS — E860 Dehydration: Secondary | ICD-10-CM

## 2022-11-23 DIAGNOSIS — Z95828 Presence of other vascular implants and grafts: Secondary | ICD-10-CM

## 2022-11-23 LAB — CBC WITH DIFFERENTIAL (CANCER CENTER ONLY)
Abs Immature Granulocytes: 0.13 10*3/uL — ABNORMAL HIGH (ref 0.00–0.07)
Basophils Absolute: 0 10*3/uL (ref 0.0–0.1)
Basophils Relative: 0 %
Eosinophils Absolute: 0 10*3/uL (ref 0.0–0.5)
Eosinophils Relative: 0 %
HCT: 26.2 % — ABNORMAL LOW (ref 36.0–46.0)
Hemoglobin: 8.5 g/dL — ABNORMAL LOW (ref 12.0–15.0)
Immature Granulocytes: 2 %
Lymphocytes Relative: 6 %
Lymphs Abs: 0.4 10*3/uL — ABNORMAL LOW (ref 0.7–4.0)
MCH: 31.3 pg (ref 26.0–34.0)
MCHC: 32.4 g/dL (ref 30.0–36.0)
MCV: 96.3 fL (ref 80.0–100.0)
Monocytes Absolute: 0.4 10*3/uL (ref 0.1–1.0)
Monocytes Relative: 6 %
Neutro Abs: 5.6 10*3/uL (ref 1.7–7.7)
Neutrophils Relative %: 86 %
Platelet Count: 186 10*3/uL (ref 150–400)
RBC: 2.72 MIL/uL — ABNORMAL LOW (ref 3.87–5.11)
RDW: 13.2 % (ref 11.5–15.5)
WBC Count: 6.6 10*3/uL (ref 4.0–10.5)
nRBC: 0 % (ref 0.0–0.2)

## 2022-11-23 LAB — CMP (CANCER CENTER ONLY)
ALT: 7 U/L (ref 0–44)
AST: 9 U/L — ABNORMAL LOW (ref 15–41)
Albumin: 3.5 g/dL (ref 3.5–5.0)
Alkaline Phosphatase: 60 U/L (ref 38–126)
Anion gap: 10 (ref 5–15)
BUN: 20 mg/dL (ref 8–23)
CO2: 28 mmol/L (ref 22–32)
Calcium: 8.4 mg/dL — ABNORMAL LOW (ref 8.9–10.3)
Chloride: 99 mmol/L (ref 98–111)
Creatinine: 0.82 mg/dL (ref 0.44–1.00)
GFR, Estimated: >60 mL/min (ref >60)
Glucose, Bld: 139 mg/dL — ABNORMAL HIGH (ref 70–99)
Potassium: 3.4 mmol/L — ABNORMAL LOW (ref 3.5–5.1)
Sodium: 137 mmol/L (ref 135–145)
Total Bilirubin: 0.6 mg/dL (ref 0.3–1.2)
Total Protein: 6.3 g/dL — ABNORMAL LOW (ref 6.5–8.1)

## 2022-11-23 MED ORDER — SODIUM CHLORIDE 0.9% FLUSH
10.0000 mL | Freq: Once | INTRAVENOUS | Status: AC
  Administered 2022-11-23: 10 mL via INTRAVENOUS

## 2022-11-23 MED ORDER — SODIUM CHLORIDE 0.9 % IV SOLN: INTRAVENOUS | Status: AC

## 2022-11-23 MED ORDER — HEPARIN SOD (PORK) LOCK FLUSH 100 UNIT/ML IV SOLN
500.0000 [IU] | Freq: Once | INTRAVENOUS | Status: AC
  Administered 2022-11-23: 500 [IU] via INTRAVENOUS

## 2022-11-23 NOTE — Progress Notes (Signed)
[facility] OFFICE PROGRESS NOTE   Diagnosis: Anal cancer  INTERVAL HISTORY:   [name] returns as scheduled.  She completed cycle 1 Taxol/carboplatin 11/03/2022.  She was seen in the emergency department yesterday following a fall.  Right foot x-ray showed a minimally displaced oblique fracture of the posterior distal fibula, moderate joint effusion and soft tissues swelling about the ankle.  CT head and cervical spine with no acute findings.  CT chest with no evidence of PE; marked bilateral upper lobe emphysema; new 5 mm pleural-based right lower lobe lung nodule.  Due to right lower extremity edema she was given a dose of Lovenox and scheduled for venous Doppler study today.  She feels she tolerated the chemotherapy well.  She denies nausea/vomiting.  No mouth sores though she did note some mouth soreness.  No diarrhea.  No rash.  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  She reports falling on 11/19/2022.  She thinks she "passed out".  She woke up on the floor.  She feels fluid intake has been adequate though she frequently notices her mouth is "dry".  She does not take blood pressure medication.   Objective:  Vital signs in last 24 hours:  Blood pressure (!) 88/69, pulse (!) 132, temperature 98.1 F (36.7 C), temperature source Oral, resp. rate 18, height 5\' 7"  (1.702 m), weight 139 lb (63 kg), SpO2 100%.    HEENT: Tongue appears dry. Resp: Distant breath sounds.  No respiratory distress. Cardio: Regular, tachycardic. GI: No hepatosplenomegaly. Vascular: Edema at the right lower leg extending to the foot.  Skin: Ecchymosis right ankle and foot.  Decreased skin turgor. Portacath without erythema.  Lab Results:  Lab Results  Component Value Date   WBC 6.6 11/23/2022   HGB 8.5 (L) 11/23/2022   HCT 26.2 (L) 11/23/2022   MCV 96.3 11/23/2022   PLT 186 11/23/2022   NEUTROABS 5.6 11/23/2022    Imaging:  CT Angio Chest PE W and/or Wo Contrast  Result  Date: 11/22/2022 CLINICAL DATA:  History of breast cancer, presenting with recent fall. EXAM: CT ANGIOGRAPHY CHEST WITH CONTRAST TECHNIQUE: Multidetector CT imaging of the chest was performed using the standard protocol during bolus administration of intravenous contrast. Multiplanar CT image reconstructions and MIPs were obtained to evaluate the vascular anatomy. RADIATION DOSE REDUCTION: This exam was performed according to the departmental dose-optimization program which includes automated exposure control, adjustment of the mA and/or kV according to patient size and/or use of iterative reconstruction technique. CONTRAST:  75mL OMNIPAQUE IOHEXOL 350 MG/ML SOLN COMPARISON:  May 12, 2022 FINDINGS: Cardiovascular: A right-sided venous Port-A-Cath is in place. There is moderate severity calcification of the aortic arch, without evidence of aortic aneurysm or dissection. Satisfactory opacification of the pulmonary arteries to the segmental level. No evidence of pulmonary embolism. Normal heart size. No pericardial effusion. Mediastinum/Nodes: No enlarged mediastinal, hilar, or axillary lymph nodes. Thyroid gland, trachea, and esophagus demonstrate no significant findings. Lungs/Pleura: There is marked severity bilateral upper lobe centrilobular and emphysematous lung disease. Stable moderate to marked severity biapical pleural scarring and/or atelectasis is noted. A no 5 mm pleural based noncalcified lung nodule is seen within the posterolateral aspect of the right lower lobe (axial CT image 52, CT series 6). Pleural based calcifications are again seen within the anterior aspect of the right upper lobe, right middle lobe and posterior right lung base. No pleural effusion or pneumothorax is identified. Upper Abdomen: No acute abnormality. Musculoskeletal: Bilateral breast implants are seen. Chronic compression  fracture deformities are seen at the levels of T8, T9 and T12. Review of the MIP images confirms the  above findings. IMPRESSION: 1. No evidence of pulmonary embolism. 2. 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 3. Stable moderate to marked severity biapical pleural scarring and/or atelectasis. 4. New 5 mm pleural based right lower lobe lung nodule. Given the patient's history of breast cancer, follow-up PET-CT or tissue sampling is recommended. This recommendation follows the consensus statement: Guidelines for Management of Incidental Pulmonary Nodules Detected on CT Images: From the Fleischner Society 2017; Radiology 2017; 284:228-243. 5. Chronic compression fracture deformities at the levels of T8, T9 and T12. 6. Aortic atherosclerosis. Aortic Atherosclerosis (ICD10-I70.0) and Emphysema (ICD10-J43.9). Electronically Signed   By: Aram Candela M.D.   On: 11/22/2022 21:01   CT Head Wo Contrast  Result Date: 11/22/2022 CLINICAL DATA:  Fall EXAM: CT HEAD WITHOUT CONTRAST CT CERVICAL SPINE WITHOUT CONTRAST TECHNIQUE: Multidetector CT imaging of the head and cervical spine was performed following the standard protocol without intravenous contrast. Multiplanar CT image reconstructions of the cervical spine were also generated. RADIATION DOSE REDUCTION: This exam was performed according to the departmental dose-optimization program which includes automated exposure control, adjustment of the mA and/or kV according to patient size and/or use of iterative reconstruction technique. COMPARISON:  01/08/2006 head CT FINDINGS: CT HEAD FINDINGS Brain: There is no mass, hemorrhage or extra-axial collection. There is generalized atrophy without lobar predilection. 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 The brain parenchyma is normal, without evidence of acute or chronic infarction. Vascular: No abnormal hyperdensity of the major intracranial arteries or dural venous sinuses. No intracranial atherosclerosis. Skull: The visualized skull base,  calvarium and extracranial soft tissues are normal. Sinuses/Orbits: No fluid levels or advanced mucosal thickening of the visualized paranasal sinuses. No mastoid or middle ear effusion. The orbits are normal. CT CERVICAL SPINE FINDINGS Alignment: Grade 1 anterolisthesis at C4-5, likely due to facet degeneration. Skull base and vertebrae: No acute fracture. Soft tissues and spinal canal: No prevertebral fluid or swelling. No visible canal hematoma. Disc levels: No advanced spinal canal or neural foraminal stenosis. Upper chest: Biapical emphysema Other: Normal visualized paraspinal cervical soft tissues. IMPRESSION: 1. No acute intracranial abnormality. 2. 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 3. No acute fracture or traumatic subluxation of the cervical spine. Electronically Signed   By: Deatra Robinson M.D.   On: 11/22/2022 19:00   CT Cervical Spine Wo Contrast  Result Date: 11/22/2022 CLINICAL DATA:  Fall EXAM: CT HEAD WITHOUT CONTRAST CT CERVICAL SPINE WITHOUT CONTRAST TECHNIQUE: Multidetector CT imaging of the head and cervical spine was performed following the standard protocol without intravenous contrast. Multiplanar CT image reconstructions of the cervical spine were also generated. RADIATION DOSE REDUCTION: This exam was performed according to the departmental dose-optimization program which includes automated exposure control, adjustment of the mA and/or kV according to patient size and/or use of iterative reconstruction technique. COMPARISON:  01/08/2006 head CT FINDINGS: CT HEAD FINDINGS Brain: There is no mass, hemorrhage or extra-axial collection. There is generalized atrophy without lobar predilection. 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 The brain parenchyma is normal, without evidence of acute or chronic infarction. Vascular: No abnormal hyperdensity of the major intracranial arteries or dural venous sinuses. No  intracranial atherosclerosis. Skull: The visualized skull base, calvarium and extracranial soft tissues are normal. Sinuses/Orbits: No fluid levels or advanced mucosal thickening of  the visualized paranasal sinuses. No mastoid or middle ear effusion. The orbits are normal. CT CERVICAL SPINE FINDINGS Alignment: Grade 1 anterolisthesis at C4-5, likely due to facet degeneration. Skull base and vertebrae: No acute fracture. Soft tissues and spinal canal: No prevertebral fluid or swelling. No visible canal hematoma. Disc levels: No advanced spinal canal or neural foraminal stenosis. Upper chest: Biapical emphysema Other: Normal visualized paraspinal cervical soft tissues. IMPRESSION: 1. No acute intracranial abnormality. 2. 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 3. No acute fracture or traumatic subluxation of the cervical spine. Electronically Signed   By: Deatra Robinson M.D.   On: 11/22/2022 19:00   DG Foot Complete Right  Result Date: 11/22/2022 CLINICAL DATA:  Status post fall 3 days ago with swelling and warmth EXAM: RIGHT FOOT COMPLETE - 3 VIEW; RIGHT ANKLE - COMPLETE 3 VIEW COMPARISON:  None Available. FINDINGS: Minimally displaced oblique fracture of the posterior distal fibula with ill-defined ossific fragments project along the lateral aspect of the distal fibula. Moderate joint effusion. There is no evidence of arthropathy or other focal bone abnormality. Ankle mortise is intact. Soft tissue swelling about the ankle. IMPRESSION: 1. Minimally displaced oblique fracture of the posterior distal fibula. Ankle mortise is intact. 2. Moderate joint effusion and soft tissue swelling about the ankle. Electronically Signed   By: Agustin Cree M.D.   On: 11/22/2022 18:52   DG Ankle Complete Right  Result Date: 11/22/2022 CLINICAL DATA:  Status post fall 3 days ago with swelling and warmth EXAM: RIGHT FOOT COMPLETE - 3 VIEW; RIGHT ANKLE - COMPLETE 3 VIEW COMPARISON:  None  Available. FINDINGS: Minimally displaced oblique fracture of the posterior distal fibula with ill-defined ossific fragments project along the lateral aspect of the distal fibula. Moderate joint effusion. There is no evidence of arthropathy or other focal bone abnormality. Ankle mortise is intact. Soft tissue swelling about the ankle. IMPRESSION: 1. Minimally displaced oblique fracture of the posterior distal fibula. Ankle mortise is intact. 2. Moderate joint effusion and soft tissue swelling about the ankle. Electronically Signed   By: Agustin Cree M.D.   On: 11/22/2022 18:52    Medications: I have reviewed the patient's current medications.  Assessment/Plan: Anal cancer Anal mass noted on digital exam and colonoscopy 05/09/2022-biopsy consistent with squamous cell carcinoma CTs 05/12/2022-no evidence of thoracic metastatic disease, calcified right pleural plaques, asymmetric thickening of the right posterior wall of the distal rectum/anal canal, no evidence of metastatic adenopathy in the abdomen or pelvis, 2 small hypodense left liver lesions favored to represent benign cysts PET scan 05/30/2022-intense hypermetabolic activity at the anus.  No evidence of metastatic adenopathy.  Indeterminate small, mildly hypermetabolic lesion in the lateral segment of the left hepatic lobe. Radiation 06/06/2022-07/19/2022 Cycle one 5-FU/Mitomycin-C 06/06/2022 cycle two 5-FU/Mitomycin-C 07/04/2022 (5-FU dose reduced secondary to diarrhea) MRI liver 06/15/2022-1 cm hypervascular mass in segment 2 of the left hepatic lobe, corresponds to the focus of hypermetabolism on recent PET-CT.  Imaging characteristics atypical for metastatic squamous cell carcinoma.  Follow-up MRI in 3 months Cycle two 5-FU/Mitomycin-C 07/04/2022 (5-FU dose reduced secondary to diarrhea) 08/29/2022 anoscopy, Dr. Maisie Fus, smaller posterior midline rectal mass approximately in the dentate line MRI liver 09/12/2022-enlargement of a complex cystic and solid-appearing  enhancing lesion in segment 2 of the liver, 4 x 2.2 cm compared to 1.5 cm on previous study PET scan 09/23/2022-interval increase in size and tracer avidity of a solitary metastasis within the lateral segment of left hepatic lobe.  Tracer avid lesion involving the anus again noted, slightly decreased in size and tracer avidity. 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 Modesta Messing 10/10/2022, presented at multidisciplinary clinic-recommendation for 3 months of treatment with systemic therapy to address the liver followed by restaging scan and also MRI of the pelvis to assess the primary site  Cycle 1 Taxol/carboplatin 11/03/2022 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 Syncopal episode 11/19/2022-question if due to dehydration.  Disposition: Alyssa Deleon completed cycle 1 Taxol/carboplatin 11/03/2022.  She seems to have tolerated well.  She presents to the office today prior to proceeding with cycle 2.  She had a syncopal episode over the weekend resulting in a fall with fracture of the right distal fibula.  She is hypotensive and tachycardic in the office today.  She appears dehydrated.  She has lost about 10 pounds since chemotherapy 3 weeks ago.  We are holding today's chemotherapy.  She will receive IV fluids with reassessment of vital signs after 500 cc.  She is scheduled for a venous Doppler study of the right lower leg later today.  At today's visit she indicates she does not have access to food on a regular basis.  We are contacting our Child psychotherapist.  [facility] dietitian will meet with her today  as well.  We are rescheduling chemotherapy with an office visit to next week.    Lonna Cobb ANP/GNP-BC   11/23/2022  9:46 AM

## 2022-11-23 NOTE — Progress Notes (Signed)
VS reported to Lonna Cobb, NP midway through infusion and after infusion was complete.  Patient consumed 2 large cups of ice water and a protein drink while in the infusion room.  Patient stated she felt better and agreed to continue to hydrate at home.  Lonna Cobb, NP made aware of patient status and gave the ok to discharge patient home.  Patient informed of Lonna Cobb, NP response and verbalized understanding.  Patient agreed to contact office with any future questions or concerns.

## 2022-11-23 NOTE — Patient Instructions (Signed)

## 2022-11-23 NOTE — ED Provider Notes (Signed)
Patient had ultrasound of right lower extremity to rule out DVT.  Ultrasound results negative.  Patient notified and taken to car where her daughter was picking her up to drive her home.   Edwin Dada P, DO 11/23/22 [id number]

## 2022-11-23 NOTE — Telephone Encounter (Signed)
Pts daughter would like a call back concerning her mother. Several issues. Please advise.

## 2022-11-23 NOTE — Progress Notes (Signed)
Patient seen by Lonna Cobb NP today  Vitals are not all within treatment parameters. Pulse 132 BP 92/71  Labs reviewed by Lonna Cobb NP and are not all within treatment parameters. K+ 3.4  Per physician team, patient will not be receiving treatment today. The patient will be receiving IVF. Please take vital signs at the first 500cc and report your findings.

## 2022-11-23 NOTE — Progress Notes (Signed)
Nutrition follow up completed during IVF. Chemotherapy cancelled today. She is s/p 1 cycle of Taxol/Carboplatin. She went to ED yesterday for a fall and was determined to have a distal Right Fibula fracture. She is scheduled for a DVT study today after fluids.  Weight: 139 pounds Aug 28 Weight 148 pounds Aug 5 Weight 182 pounds March 22  24% wt loss in less than 6 months.  Labs include K 3.4, Glucose 139, Albumin 3.5, HGB 8.5.  Edema present on physical exam.  Patient "tries" to drink a lot of water. She doesn't know why she is dehydrated. Reports increased fatigue and weakness. Noted patient reported food insecurity. States she cannot afford Ensure although she likes it and would drink more. She has diarrhea and fecal incontinence. Denies nausea and vomiting.  Nutrition Diagnosis: Unintended wt loss, continues.  Intervention:  Continue low fiber diet. Educated on increasing hydration throughout the day. Encouraged small, frequent meals/snacks. Recommend increase oral nutrition supplements and provided coupons. Suggested low cost alternatives. Provided additional fact sheets.  Monitoring, Evaluation, Goals: Increase oral intake to minimize wt loss.  Next Visit: To be scheduled as needed.

## 2022-11-23 NOTE — Telephone Encounter (Signed)
Returned pt call, went straight to voicemail. LVM to return call

## 2022-11-23 NOTE — Patient Instructions (Signed)

## 2022-11-24 ENCOUNTER — Encounter: Payer: Self-pay | Admitting: Oncology

## 2022-11-24 ENCOUNTER — Inpatient Hospital Stay: Payer: Medicare Other

## 2022-11-24 NOTE — Telephone Encounter (Signed)
 Clinical Social Work was referred by medical provider for assessment of psychosocial needs.  CSW attempted to contact patient by phone.  Left voicemail with contact information and request for return call.

## 2022-11-25 ENCOUNTER — Encounter
Admit: 2022-11-25 | Payer: PRIVATE HEALTH INSURANCE | Attending: Student in an Organized Health Care Education/Training Program | Primary: Internal Medicine

## 2022-11-25 ENCOUNTER — Encounter
Admit: 2022-11-25 | Payer: BLUE CROSS/BLUE SHIELD | Attending: Student in an Organized Health Care Education/Training Program | Primary: Internal Medicine

## 2022-11-25 ENCOUNTER — Encounter: Admit: 2022-11-25 | Payer: BLUE CROSS/BLUE SHIELD | Attending: Gastroenterology | Primary: Internal Medicine

## 2022-11-29 ENCOUNTER — Other Ambulatory Visit: Payer: Self-pay

## 2022-11-29 ENCOUNTER — Encounter: Admit: 2022-11-29 | Payer: PRIVATE HEALTH INSURANCE | Attending: Emergency Medicine | Primary: Internal Medicine

## 2022-11-29 ENCOUNTER — Inpatient Hospital Stay
Admit: 2022-11-29 | Discharge: 2022-11-29 | Payer: BLUE CROSS/BLUE SHIELD | Attending: Student in an Organized Health Care Education/Training Program

## 2022-11-29 ENCOUNTER — Emergency Department: Admit: 2022-11-29 | Payer: BLUE CROSS/BLUE SHIELD | Primary: Internal Medicine

## 2022-11-29 ENCOUNTER — Ambulatory Visit: Admit: 2022-11-29 | Payer: PRIVATE HEALTH INSURANCE | Attending: Hematology | Primary: Internal Medicine

## 2022-11-29 ENCOUNTER — Encounter: Admit: 2022-11-29 | Payer: PRIVATE HEALTH INSURANCE | Primary: Internal Medicine

## 2022-11-29 DIAGNOSIS — R45851 Suicidal ideations: Secondary | ICD-10-CM

## 2022-11-29 DIAGNOSIS — F4323 Adjustment disorder with mixed anxiety and depressed mood: Secondary | ICD-10-CM

## 2022-11-29 DIAGNOSIS — F17210 Nicotine dependence, cigarettes, uncomplicated: Secondary | ICD-10-CM

## 2022-11-29 DIAGNOSIS — Z91148 Patient's other noncompliance with medication regimen for other reason: Secondary | ICD-10-CM

## 2022-11-29 DIAGNOSIS — C3490 Malignant neoplasm of unspecified part of unspecified bronchus or lung: Secondary | ICD-10-CM

## 2022-11-29 DIAGNOSIS — F419 Anxiety disorder, unspecified: Secondary | ICD-10-CM

## 2022-11-29 DIAGNOSIS — F32A Depression, unspecified: Secondary | ICD-10-CM

## 2022-11-29 DIAGNOSIS — G3184 Mild cognitive impairment, so stated: Secondary | ICD-10-CM

## 2022-11-29 DIAGNOSIS — Z8709 Personal history of other diseases of the respiratory system: Secondary | ICD-10-CM

## 2022-11-29 DIAGNOSIS — R413 Other amnesia: Secondary | ICD-10-CM

## 2022-11-29 DIAGNOSIS — R0902 Hypoxemia: Secondary | ICD-10-CM

## 2022-11-29 DIAGNOSIS — B1920 Unspecified viral hepatitis C without hepatic coma: Secondary | ICD-10-CM

## 2022-11-29 DIAGNOSIS — Z880 Allergy status to penicillin: Secondary | ICD-10-CM

## 2022-11-29 DIAGNOSIS — Z720 Tobacco use: Secondary | ICD-10-CM

## 2022-11-29 DIAGNOSIS — J449 Chronic obstructive pulmonary disease, unspecified: Secondary | ICD-10-CM

## 2022-11-29 DIAGNOSIS — R531 Weakness: Secondary | ICD-10-CM

## 2022-11-29 DIAGNOSIS — R296 Repeated falls: Secondary | ICD-10-CM

## 2022-11-29 DIAGNOSIS — R152 Fecal urgency: Secondary | ICD-10-CM

## 2022-11-29 DIAGNOSIS — C210 Malignant neoplasm of anus, unspecified: Secondary | ICD-10-CM

## 2022-11-29 LAB — BASIC METABOLIC PANEL
BKR ANION GAP: 10 (ref 7–17)
BKR BLOOD UREA NITROGEN: 23 mg/dL (ref 8–23)
BKR BUN / CREAT RATIO: 42.6 — ABNORMAL HIGH (ref 8.0–23.0)
BKR CALCIUM: 9.9 mg/dL (ref 8.8–10.2)
BKR CHLORIDE: 104 mmol/L (ref 98–107)
BKR CO2: 28 mmol/L (ref 20–30)
BKR CREATININE: 0.54 mg/dL (ref 0.40–1.30)
BKR EGFR, CREATININE (CKD-EPI 2021): >60 mL/min/{1.73_m2} (ref >=60)
BKR GLUCOSE: 104 mg/dL — ABNORMAL HIGH (ref 70–100)
BKR POTASSIUM: 4.9 mmol/L (ref 3.3–5.3)
BKR SODIUM: 142 mmol/L (ref 136–144)

## 2022-11-29 LAB — CBC WITH AUTO DIFFERENTIAL
BKR WAM ABSOLUTE IMMATURE GRANULOCYTES.: 0.03 x 1000/ÂµL (ref 0.00–0.30)
BKR WAM ABSOLUTE LYMPHOCYTE COUNT.: 0.54 x 1000/ÂµL — ABNORMAL LOW (ref 0.60–3.70)
BKR WAM ABSOLUTE NRBC (2 DEC): 0 x 1000/ÂµL (ref 0.00–1.00)
BKR WAM ANC (ABSOLUTE NEUTROPHIL COUNT): 5.65 x 1000/ÂµL (ref 2.00–7.60)
BKR WAM BASOPHIL ABSOLUTE COUNT.: 0.05 x 1000/ÂµL (ref 0.00–1.00)
BKR WAM BASOPHILS: 0.7 % (ref 0.0–1.4)
BKR WAM EOSINOPHIL ABSOLUTE COUNT.: 0.16 x 1000/ÂµL (ref 0.00–1.00)
BKR WAM EOSINOPHILS: 2.3 % (ref 0.0–5.0)
BKR WAM HEMATOCRIT (2 DEC): 43.4 % (ref 35.00–45.00)
BKR WAM HEMOGLOBIN: 14.5 g/dL (ref 11.7–15.5)
BKR WAM IMMATURE GRANULOCYTES: 0.4 % (ref 0.0–1.0)
BKR WAM LYMPHOCYTES: 7.7 % — ABNORMAL LOW (ref 17.0–50.0)
BKR WAM MCH (PG): 32.7 pg (ref 27.0–33.0)
BKR WAM MCHC: 33.4 g/dL (ref 31.0–36.0)
BKR WAM MCV: 97.7 fL (ref 80.0–100.0)
BKR WAM MONOCYTE ABSOLUTE COUNT.: 0.62 x 1000/ÂµL (ref 0.00–1.00)
BKR WAM MONOCYTES: 8.8 % (ref 4.0–12.0)
BKR WAM MPV: 8.9 fL (ref 8.0–12.0)
BKR WAM NEUTROPHILS: 80.1 % — ABNORMAL HIGH (ref 39.0–72.0)
BKR WAM NUCLEATED RED BLOOD CELLS: 0 % (ref 0.0–1.0)
BKR WAM PLATELETS: 288 x1000/ÂµL (ref 150–420)
BKR WAM RDW-CV: 14.3 % (ref 11.0–15.0)
BKR WAM RED BLOOD CELL COUNT.: 4.44 M/ÂµL (ref 4.00–6.00)
BKR WAM WHITE BLOOD CELL COUNT: 7.1 x1000/ÂµL (ref 4.0–11.0)

## 2022-11-29 LAB — HEPATIC FUNCTION PANEL
BKR A/G RATIO: 1.4 (ref 1.0–2.2)
BKR ALANINE AMINOTRANSFERASE (ALT): 9 U/L — ABNORMAL LOW (ref 10–35)
BKR ALBUMIN: 4.2 g/dL (ref 3.6–5.1)
BKR ALKALINE PHOSPHATASE: 82 U/L (ref 9–122)
BKR ASPARTATE AMINOTRANSFERASE (AST): 27 U/L (ref 10–35)
BKR AST/ALT RATIO: 3
BKR BILIRUBIN DIRECT: <0.2 mg/dL (ref <=0.3)
BKR BILIRUBIN TOTAL: 0.2 mg/dL (ref <=1.2)
BKR GLOBULIN: 3 g/dL (ref 2.0–3.9)
BKR PROTEIN TOTAL: 7.2 g/dL (ref 5.9–8.3)

## 2022-11-29 LAB — MAGNESIUM: BKR MAGNESIUM: 2.5 mg/dL — ABNORMAL HIGH (ref 1.7–2.4)

## 2022-11-29 MED ORDER — OLANZAPINE 5 MG TABLET
5 mg | Freq: Every evening | ORAL | Status: DC
Start: 2022-11-29 — End: 2022-11-29

## 2022-11-29 MED ORDER — LACTATED RINGERS IV BOLUS (NEW BAG)
Freq: Once | INTRAVENOUS | Status: DC
Start: 2022-11-29 — End: 2022-11-29

## 2022-11-29 NOTE — Telephone Encounter (Signed)
Patient's daughter called for an update. Requests to be called directly @ [phone].

## 2022-11-29 NOTE — Telephone Encounter (Signed)
Home health orders placed.

## 2022-11-29 NOTE — Telephone Encounter (Signed)
Pt daughter Alyssa Deleon states pt has fell again and was seen in ED 1 wk ago; last chemo treatment cancelled due to not doing well. Pt daughter is requesting some sort of Home Health assistance a couple times weekly for baths etc. Please advise

## 2022-11-29 NOTE — ED Notes
Per MD, referral made to [facility] IOP

## 2022-11-29 NOTE — ED Notes
IV REMOVED AT THIS TIME PER RN.

## 2022-11-29 NOTE — Other
[place] [facility] EMERGENCY DEPARTMENT	Emergency Department Psychiatric Evaluation9/3/2024Location of Evaluation (ex: [facility] CIU, [facility] Crisis):  [facility] ED B side Courtney Knox is a 67 y.o. Married female.PRESENTATION HISTORY Referred by:  SelfPatient seen in consultation at the request of:  ED Medicine Attending Dr. Roney Marion, MD MPHTransported from:  [facility] Status on Arrival:  PEERSource of Information:  Patient, Family, and Chart/Previous RecordChief Complaint:  they aren't letting me eatHPIProblem(s): dysphoria, anxietyTiming:  gradual onsetSeverity:  mildAssociated Signs and Symptoms:  concrete thought processModifying Factors:  Medication non-adherenceAccess to Firearms:  deniesContext: Patient denies SI/SIB/HI on interview, tangential in discussion of concern for past/recent SI but understood to communicate that she does feel some hopelessness as she is anxious regarding her medical course, her financial situation, and the integrity of her marriage. Denies AVH and substance use. Patient shares her concern that she will never be able to leave the hospital now that she is in ED today and that she will never be allowed to eat and that her husband will not want to be with her. Later met with patient and patient reaffirmed earlier denial of SI/SIB/HI and endorsed being reassured that her husband, Courtney Knox, reports he is coming to see her and will facilitate discharge to home. Collateral Ileta Sadosky (husband) [phone] financial distress, denies any marital troubles, reports his intent to come get his wife. Reports last night patient woke up complaining in pain in her hip, patient dialed 911, reports police and EMS services arrived and brought patient to ED. Denies statements or behaviors concerning for safety risk to self or others. Endorses that patient has been more confused past several months. Reports she may be stressed by medical  Reports next appt with Dr. Maxcine Ham 9/13. REVIEW OF SYSTEMS Review of Systems Constitutional:  Negative for chills, diaphoresis, fatigue and fever. HENT:  Negative for sinus pain and sore throat.  Eyes:  Negative for photophobia and visual disturbance. Respiratory:  Negative for cough, shortness of breath and stridor.  Cardiovascular:  Negative for chest pain. Gastrointestinal:  Negative for abdominal pain, diarrhea, nausea and vomiting. Genitourinary:  Negative for dysuria. Musculoskeletal:  Positive for arthralgias (chronic hip pain). Negative for myalgias. Neurological:  Positive for numbness (reports her feet are asleep). Negative for tremors, syncope, speech difficulty, light-headedness and headaches. Psychiatric/Behavioral:  Positive for dysphoric mood. Negative for agitation, behavioral problems, self-injury, sleep disturbance and suicidal ideas. The patient is nervous/anxious.  OUTPATIENT MEDICATIONS Current Outpatient Medications Medication Sig  b complex vitamins Take 1 tablet by mouth daily. (Patient not taking: Reported on 10/28/2022)  calcium carbonate/vitamin D3 (CALCIUM WITH VITAMIN D3 ORAL) Take 1 tablet by mouth daily. (Patient not taking: Reported on 10/28/2022)  Epclusa Take 1 tablet by mouth daily. (Patient not taking: Reported on 11/16/2022)  famotidine Take 1 tablet (20 mg total) by mouth daily. (Patient not taking: Reported on 11/16/2022)  hydrOXYzine Take 1 tablet (25 mg total) by mouth 3 (three) times daily as needed for itching. (Patient not taking: Reported on 11/16/2022)  prochlorperazine Take 1 tablet (10 mg total) by mouth every 6 (six) hours as needed (for nausea or vomiting). (Patient not taking: Reported on 11/16/2022)  sertraline Take 1 tablet (25 mg total) by mouth daily. (Patient not taking: Reported on 11/16/2022)  traMADoL Take 1 tablet (50 mg total) by mouth. (Patient not taking: Reported on 11/16/2022) Medication Comments:  recommended patient to restart sertraline 25mg  QDHEALTH ISSUES / MEDICAL HISTORY / ALLERGIES Past Medical History: Diagnosis Date  Anxiety   COPD (  chronic obstructive pulmonary disease) (HC Code)   Depression   HCV (hepatitis C virus)   Lung cancer (HC Code)   Memory loss   Tobacco abuse  OB History No obstetric history on file. No past surgical history on file.Allergies: PenicillinPSYCHIATRIC  TREATMENT HISTORY Psychiatric Treatment History Current Psychiatric Providers (on file): SOCIAL HISTORY Social History Socioeconomic History  Marital status: Married Occupational History  Occupation: Past [employer]in [employer], [employer] Tobacco Use  Smoking status: Every Day   Current packs/day: 1.00   Average packs/day: 1 pack/day for 51.7 years (51.7 ttl pk-yrs)   Types: Cigarettes   Start date: 1973  Smokeless tobacco: Never  Tobacco comments:   1PPD x 40 years Vaping Use  Vaping Use: Never used Substance and Sexual Activity  Alcohol use: Not Currently  Drug use: Never Other Topics Concern  Military Service Yes   Comment: [employer] Social Determinants of Health Food Insecurity: Patient Declined (10/11/2022)  Hunger Vital Sign   Worried About Running Out of Food in the Last Year: Patient declined   Ran Out of Food in the Last Year: Patient declined Transportation Needs: Patient Declined (10/11/2022)  PRAPARE - Designer, jewellery (Medical): Patient declined   Lack of Transportation (Non-Medical): Patient declined Housing Stability: Low Risk  (10/11/2022)  Housing Stability   Housing Stability: I have a steady place to live   Housing Stability: Patient declined SUBSTANCE ABUSE HISTORY Alcohol (quantity, last use): deniesOther substances (quantity, last use): deniesFAMILY HISTORY Family History Problem Relation Age of Onset  Lung cancer Father   Lymphoma Brother  LABORATORY/DIAGNOSTIC RESULTS Recent Results (from the past 24 hour(s)) Hepatic function panel  Collection Time: 11/29/22  4:13 AM Result Value Ref Range  Total Bilirubin 0.2 <=1.2 mg/dL  Bilirubin, Direct <5.4 <=0.3 mg/dL  Alkaline Phosphatase 82 9 - 122 U/L  Alanine Aminotransferase (ALT) 9 (L) 10 - 35 U/L  Aspartate Aminotransferase (AST) 27 10 - 35 U/L  AST/ALT Ratio 3.0 Reference Range Not Established  Total Protein 7.2 5.9 - 8.3 g/dL  Albumin 4.2 3.6 - 5.1 g/dL  Globulin 3.0 2.0 - 3.9 g/dL  A/G Ratio 1.4 1.0 - 2.2 Magnesium  Collection Time: 11/29/22  4:13 AM Result Value Ref Range  Magnesium 2.5 (H) 1.7 - 2.4 mg/dL Basic metabolic panel  Collection Time: 11/29/22  4:13 AM Result Value Ref Range  Sodium 142 136 - 144 mmol/L  Potassium 4.9 3.3 - 5.3 mmol/L  Chloride 104 98 - 107 mmol/L  CO2 28 20 - 30 mmol/L  Anion Gap 10 7 - 17  Glucose 104 (H) 70 - 100 mg/dL  BUN 23 8 - 23 mg/dL  Creatinine 0.98 1.19 - 1.30 mg/dL  Calcium 9.9 8.8 - 14.7 mg/dL  BUN/Creatinine Ratio 82.9 (H) 8.0 - 23.0  eGFR (Creatinine) >60 >=60 mL/min/1.35m2 CBC auto differential  Collection Time: 11/29/22  4:13 AM Result Value Ref Range  WBC 7.1 4.0 - 11.0 x1000/?L  RBC 4.44 4.00 - 6.00 M/?L  Hemoglobin 14.5 11.7 - 15.5 g/dL  Hematocrit 56.21 30.86 - 45.00 %  MCV 97.7 80.0 - 100.0 fL  MCH 32.7 27.0 - 33.0 pg  MCHC 33.4 31.0 - 36.0 g/dL  RDW-CV 57.8 46.9 - 62.9 %  Platelets 288 150 - 420 x1000/?L  MPV 8.9 8.0 - 12.0 fL  Neutrophils 80.1 (H) 39.0 - 72.0 %  Lymphocytes 7.7 (L) 17.0 - 50.0 %  Monocytes 8.8 4.0 - 12.0 %  Eosinophils 2.3 0.0 - 5.0 %  Basophil 0.7 0.0 -  1.4 %  Immature Granulocytes 0.4 0.0 - 1.0 %  nRBC 0.0 0.0 - 1.0 %  Absolute Lymphocyte Count 0.54 (L) 0.60 - 3.70 x 1000/?L  Monocyte Absolute Count 0.62 0.00 - 1.00 x 1000/?L  Eosinophil Absolute Count 0.16 0.00 - 1.00 x 1000/?L Basophil Absolute Count 0.05 0.00 - 1.00 x 1000/?L  Absolute Immature Granulocyte Count 0.03 0.00 - 0.30 x 1000/?L  Absolute nRBC 0.00 0.00 - 1.00 x 1000/?L  ANC (Abs Neutrophil Count) 5.65 2.00 - 7.60 x 1000/?L CG4, I-Stat, venous     ([facility])  Collection Time: 11/29/22  4:18 AM Result Value Ref Range  Lactate, POC 0.8 0.5 - 2.2 mmol/L  pH, Venous (POC) 7.43 7.32 - 7.43 units  pCO2, Venous (POC) 45 38 - 54 mmHg  pO2, Venous (POC) 81 (H) 40 - 50 mmHg  HCO3 Calculated, Venous (POC) 30.1 Not established mmol/L  Base Excess, Venous (POC) 6 Not established mmol/L  O2 Sat Calculated, Venous (POC) 96 Not established %  CO2 Calculated, Venous (POC) 31.0 (H) 22.0 - 30.0 mmol/L EKG  Collection Time: 11/29/22  4:22 AM Result Value Ref Range  Heart Rate 62 bpm  QRS Interval 76 ms  QT Interval 433 ms  QTC Interval 442 ms  P Axis 79 deg  QRS Axis 73 deg  T Wave Axis 73 deg  P-R Interval 170 msec  SEVERITY Abnormal ECG severity Relevant laboratory/medical data (from the past 24 hours) includes:  O2 sat 96% on RAPHYSICAL EXAM Vitals:  11/29/22 0838 BP: (!) 142/79 Pulse: (!) 58 Resp: 18 Temp: 98.1 ?F (36.7 ?C) Physical ExamConstitutional:     Appearance: Normal appearance. HENT:    Head: Normocephalic and atraumatic.    Nose: Nose normal.    Mouth/Throat:    Mouth: Mucous membranes are moist.    Pharynx: Oropharynx is clear. Eyes:    Extraocular Movements: Extraocular movements intact.    Conjunctiva/sclera: Conjunctivae normal. Cardiovascular:    Rate and Rhythm: Bradycardia present.    Pulses: Normal pulses. Pulmonary:    Effort: Pulmonary effort is normal. Abdominal:    General: Abdomen is flat.    Palpations: Abdomen is soft. Musculoskeletal:       General: Normal range of motion.    Cervical back: Normal range of motion. Skin:   General: Skin is warm and dry. Neurological:    General: No focal deficit present.    Mental Status: She is alert and oriented to person, place, and time. Mental status is at baseline. Psychiatric:       Behavior: Behavior normal. I have reviewed the physical exam/work-up performed by Dr. Roney Marion, MD MPH.The patient has been medically cleared by Dr. Roney Marion, MD MPH.PSYCHIATRIC EXAMINATION General AppearanceHabitus was thin. Height was average. Grooming was fair.  Musculoskeletal Station was normal. Strength was normal. Posture was normal.  Psychiatric ExaminationAttitude: open, cooperative, good eye contact Psychomotor Behavior: normal Speech:  Normal rate, volume and prosody   Mood: Patient reported mood:  worried about my husbandAffect:  Affective Quality was dysphoric, was anxious. Affective Range: full range  Affective Intensity: normal Thought Process:  Coherent and goal-directed was tangential.Associations: normal Thought Content: no delusions or hallucinations Suicidal Ideation: no current suicidal ideation, plan or intent Violent Ideation: no current violent/homicidal ideation, intent or plan Insight:  Fair Judgment:  Fair Cognitive EvaluationSensorium: alert Orientation: oriented to person, place and date Attention and Concentration:  Normal attention and concentration  Memory: recall (recent memory) and remote memory intact  Language: language  intact  Fund of Knowledge:  fair     SAFETY AND RISK ASSESSMENT  C-SSRS (Screen-Most Recent) - 11/29/22 0132    C-SSRS (Screen-Most Recent)  In the past month have you wished you were dead or wished you could go to sleep and not wake up? no  -MF at 11/29/22 0132  In the past month have you actually had any thoughts of killing yourself? no  -MF at 11/29/22 0132  Have you ever done anything, started to do anything, or prepared to do anything to end your life? no  -MF at 11/29/22 0132  [place] Suicide Risk Level low risk  -MF at 11/29/22 0132    User Key  (r) = Recorded By, (t) = Taken By, (c) = Cosigned By  Initials Name  MF Nile Dear, RN    PSY RISK ASSESSMENT SAFE-T WITH C-SSRS  Reason for Assessment:  Positive C-SSRS Screen - LowC-SSRS:   WISH TO BE DEAD:  No  SUICIDAL THOUGHTS:  No  Have you ever done anything, started to do anything or prepared to do anything to end your life?:  NoRisk Assessment: Current and Past Psychiatric Diagnoses:    Mood Disorder:  Recurrent/Current  Psychotic Disorder:  No  Alcohol/Substance Abuse Disorders:  No  Cluster B Personality Disorders or Traits:  Defer for Further Assessment  Suicide Attempt:  No Prior Attempts  Presenting Symptoms:  Hopelessness or Despair:  Yes  Anxiety and/or Panic:  Yes  Precipitants / Stressors:   Chronic physical pain or other acute medical problem:  Yes  Change in Treatment:    Hopeless or dissatisfied with provider or treatment:  Yes  Non-compliant:  Yes  Not receiving treatment:  YesRisk to Self - Self-Injurious Behavior:   Current Urges to harm Self:  No  History of Self Injury:  No  Imminent Risk for Self Injury in Community:  Low  Imminent Risk for Self Injury in Facility:  LowRisk to Others:   Current Agitation:  No  Homicidal/Aggressive Ideation:  No  Homicidal/Aggressive Threat/Plan:  No  Recent Violence/Aggression:  No  Imminent Risk for Violence in Community:  Low  Imminent Risk for Violence in Facility:  LowIMPRESSION / ASSESSMENT AND PLAN Maylena Zahler is a 67 yo F with chart hx of anxiety and depression and recently diagnosed cognitive impairment, COPD, HEP C, and keratinizing SCC of the lung having completed course of radiation therapy 8/2 after reported intolerance of chemotherapy who called 911 for hopelessness, para-suicidal statements. On interview, patient observed to have concrete thought process and endorsing recent low mood and anxiety, denying acute safety concern with reassuring collateral from husband. Impression is patient at recent baseline, with unspecified mixed anxiety and depression, likely adjustment disorder considering recent medical concerns and reported chronology of mood symptoms, with recent-onset cognitive decline of uncertain etiology. PLAN-discharge to home-recommend restart PTA sertraline 25mg  QD-referral placed to Cleone Slim also consider outpatient mental health support through [facility]-continue to f/u with medical providers (Dr. 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  The patient's risk in the facility is assessed to be  MINIMAL.  Concerning acute risk factors include numerous medical concerns, chronic pain.  The patient has many protective factors which include supportive family, not psychotic, capacity for therapeutic alliance. Suicide Risk is based  on the definitions described by the [facility] and Suicide Prevention Resource Center's screening tool, the Suicide Assessment Five-step Evaluation and Triage (SAFE-T) for Mental Health Professionals (see: SAFE_T.pdf).  Based on this assessment, the patient will be: discharged.Legal Status Post Evaluation:  No Legal Status (Observation, Re-Evaluation, Continue on PEER or Discharge)Additional Disposition Information/Plan:  Carrie Mew, MD [facility] Ron Parker, MDResident09/03/24 [phone]

## 2022-11-29 NOTE — ED Notes
6:47 AM report received care assumed. Pt increasingly confused at home and endorsing SI. +Peer. Pt endorses stressor of cancer. Presented hypoxic for EMS, patient has not desated for previous RN. Pt a/o x4 but requires frequent reorientation and repeated education. +paranoia. No IV at this time. Patient refusing repeat VS at this time. Pt husband reported to triage RN that patient has been more disoriented since starting new chemo meds 2 weeks ago. Pending psych eval. Past Medical History: Diagnosis Date  Anxiety   COPD (chronic obstructive pulmonary disease) (HC Code)   Depression   HCV (hepatitis C virus)   Lung cancer (HC Code)   Memory loss   Tobacco abuse  Chief Complaint Patient presents with  Suicidal   Pt currently diagnosed with cancer and undergoing chemo treatment.  Per EMS pt has made multiple statements to her husband that she just wants to die. Stating same in triage. Arrived on PREE.   Decreased Oxygen Level-no Symptoms   Oxygen saturation 85% on RA on EMS arrival; [facility].  Pt is not oxygen dependent at baseline. 7:13 AM report given to oncoming RN, care deferred

## 2022-11-29 NOTE — Discharge Instructions
You came to the hospital because you were in emotional crisis and made statements concerning for suicide, but later clarified that these were made in anger without suicidal intent or planning, and were able to develop a plan for safe DC.  - You should continue to see your current prescriber per usual schedule.- You should re-engage in therapy    - Consider calling your prior therapist's office to see if there's any different therapists in the same office taking new patients.      - Consider seeking a new therapist with training in DBT skills, as this can be helpful when suicidal thinking is a recurring problem.    - PsychologyToday.com is a good resource to find new therapist who will take your insurance    - I have also provided referral information for [facility], if you decide you need a higher level of support. At home, you should spend some time identifying the activities (I.e. coping skills) that help you feel better when you're badly depressed or feeling suicidal.   Make a plan to engage in these activities each day preventatively.   Talk with friends and family members about how you are feeling and try making plans that get you out of the house.   Physical exercise is also a highly effective treatment for depression that you should consider.     If you have no one you can talk to, you can always reach out to the resources below:-Call 211 and request suicide prevention and crisis intervention. -Call the national Prevention Lifeline at 1-800-273-8255 (TALK). If you are having any of the following, you should return to the nearest Emergency Dept.  - Worsening suicidal thinking with intent to harm yourself or plan to harm yourself.                                                                                                                                                        [facility]  Offers outpatient (OP) treatment, recovery coaching, intensive outpatient programming (IOP), medication for addiction treatment (MAT) (Suboxone/Vivitrol), and primary care and wellness services. Located in [place] ([address] and [place] ([address]) Co-ed. [phone]    [facility] Offers comprehensive dual-diagnosis individual and group OP treatment, IOP and MAT (Suboxone/Vivitrol) [address], [place] Co-ed.  [phone]   [facility] Offers adult OP and IOP treatment, MAT (Suboxone) and residential services. [address] [place] Men only. [phone]  After discharge, if you experience thoughts of suicide, please use one of the following resources for help:1.  Call your outpatient psychiatric provider(s), for example, psychiatrist, therapist and/or in-home service.2.  Call 211 and request suicide prevention and crisis intervention.3.  Call the national Prevention Lifeline at 1-800-273-8255 (TALK).4.  Text the Crisis Text Line by texting the word HOME to   7417415.  Call 9116.  Go to your local hospital Emergency Department.7.  Call 98824-Hour Suicide Crisis Line in the entire State of [place]9am - 10pm: [phone]) [phone] or (203) [phone] - 8am: [phone] or [phone] (TDD)24 hr Crisis Services and Resources at [employer]:[email or web address]-24-hour-[email or web address]

## 2022-11-29 NOTE — ED Notes
7:26 AM Chief Complaint Patient presents with  Suicidal   Pt currently diagnosed with cancer and undergoing chemo treatment.  Per EMS pt has made multiple statements to her husband that she just wants to die. Stating same in triage. Arrived on PREE.   Decreased Oxygen Level-no Symptoms   Oxygen saturation 85% on RA on EMS arrival; sats improved with [facility].  Pt is not oxygen dependent at baseline. Assume pt care.  Pt has recent dx of lung CA.  Pt placed on new chemo tx. Pt was hypoxic at home, but has had solid SPO2 since [facility].  Pt is in process.  Pt arrived on a PEER.  Confirm that pt is Dc'd before allowing to depart. Pt has been medically cleared - pending psych review. Pt asked to 1. Eat; 2. Call her husband.  Staff recommended she make these requests when provider comes to bedside. 8:47 AMProvider confirmed that patient could eat regular diet.  Also, that patient could call her husband.9:05 AMProvider meeting with patient. 9:54 AMProvider meeting with patient at this time. 10:00 AMProvider states that patient can have visitors, and that patient can be dc'd. 11:43 AMPt cleared for discharge per provider. Provider at bedside reviewing instructions. Pt remains alert and oriented x  4 Pt able to ambulate without difficulty. Pain well controlled per pt.  Pt belonging's being tracked down - [facility] [employer].

## 2022-11-29 NOTE — ED Notes
1:55 AM PT presents to ED by ambulance on PEER for SI. PT arrives alert and paranoid saying she has medical and financial stress, VSS in NAD, breathing even and unlabored, speaking in clear and complete sentences. Safety check completed, PT changed into paper scrubs, pending provider eval. Chief Complaint Patient presents with  Suicidal   Pt currently diagnosed with cancer and undergoing chemo treatment.  Per EMS pt has made multiple statements to her husband that she just wants to die. Stating same in triage. Arrived on PREE.   Decreased Oxygen Level-no Symptoms   Oxygen saturation 85% on RA on EMS arrival; sats improved with [facility].  Pt is not oxygen dependent at baseline. 2:50 AM PT transported to [place] and xray.3:15 AM MD Cleaver at bedside.6:20 AM PT stating she is unable to provide urine d/t [place] not giving her food and drink. MD Cleaver notified, LR bolus ordered.6:38 AM PT screaming with PIV flush, MD Cleaver at bedside. PIV removed, PT medically cleared pending psych eval.6:46 AM Verbal report given to [facility] RN. Care deferred.

## 2022-11-29 NOTE — ED Notes
PT REFUSED VITALS AND HAS BECOME AGITATED WITH STAFF

## 2022-11-29 NOTE — ED Provider Notes
Chief Complaint Patient presents with  Suicidal   Pt currently diagnosed with cancer and undergoing chemo treatment.  Per EMS pt has made multiple statements to her husband that she just wants to die. Stating same in triage. Arrived on [facility].   Decreased Oxygen Level-no Symptoms   Oxygen saturation 85% on RA on EMS arrival; sats improved with [facility].  Pt is not oxygen dependent at baseline. MDM --------------------------------------------------------------------------------------------------------------------------------Medical Decision Making:Pt is a 67 y.o. female w/ PMH of lung cancer, COPD coming in with 1 month of worsening mental status, anxiety, paranoia, SI.  Evening she woke up in bed and suddenly started endorsing to her husband that she was having suicidal ideation. Patient is nontoxic in appearance but difficult to obtain history due to tangential line of thought.  Primarily complaining of left hip pain but otherwise does not have any specific focal complaints.  Reportedly was hypoxic with the EMS but is not on oxygen in the ED and a satting at 97%. Primarily history obtained from husband.  Has been says that over the past month she has been refusing to take meds, ruminating significantly on possible losing their house, dying, in general depression.  Besides reporting some hip pain, he has not been concern for any medical issues.On exam she is nontoxic in appearance, but chronically ill-appearing.Plan for basic medical workup to rule out new brain met that could be contributing to mental status or UTI/metabolic issue. Plan for basic labs, UA, [place] head.We will admit to Medicine with inpatient psych consult.See comments below for ED progress.Clinical Impressions as of 11/29/22 1610 Suicidal ideation All decisions were discussed with attending physician. Modena Slater, MDPGY-4 Emergency MedicineAvailable through MHB9/05/2022 2:43 AMThis note may have been generated using M*Modal voice dictation software please excuse any typographical errors due to flaws in voice recognition.This encounter occurred within the context of the Covid-19 pandemic. -------------------------------------------------------------------------------------------------------------------------------- Physical ExamED Triage Vitals [11/29/22 0118]BP: 121/65Pulse: 81Pulse from  O2 sat: n/aResp: 18Temp: 98.1 ?F (36.7 ?C)Temp src: TemporalSpO2: 97 % BP 113/68  - Pulse 61  - Temp 97.6 ?F (36.4 ?C) (Oral)  - Resp 18  - SpO2 99% Physical Exam ProceduresAttestation/Critical CarePatient Reevaluation: Attending Supervised: ResidentI saw and examined the patient. I agree with the findings and plan of care as documented in the resident's note except as noted below. Additional acute and/or chronic problems addressed: 89 F hx of COPD and NSCLC, with progressively worsening altered MS, over past month. Has been worked up [facility] onc and [name] without etiology, tonight with SIOrdering labs, [place] head r/o bleed, met, metabolic derangementMedically clear for CIUAlyssa R FrenchClinical Impressions as of 11/29/22 9604 Suicidal ideation  ED DispositionTransfer To Another Facility: [facility] Modena Slater, MDResident09/03/24 [id number] Roney Marion, MD MPH09/03/24 [id number] Roney Marion, MD MPH09/03/24 [phone] Roney Marion, MD MPH09/03/24 [phone]

## 2022-11-30 ENCOUNTER — Inpatient Hospital Stay: Payer: Medicare Other | Attending: Oncology

## 2022-11-30 ENCOUNTER — Telehealth: Payer: Self-pay

## 2022-11-30 ENCOUNTER — Other Ambulatory Visit: Payer: Self-pay

## 2022-11-30 ENCOUNTER — Inpatient Hospital Stay: Payer: Medicare Other

## 2022-11-30 ENCOUNTER — Other Ambulatory Visit: Payer: Self-pay | Admitting: Nurse Practitioner

## 2022-11-30 ENCOUNTER — Encounter: Payer: Self-pay | Admitting: Nurse Practitioner

## 2022-11-30 ENCOUNTER — Other Ambulatory Visit (HOSPITAL_BASED_OUTPATIENT_CLINIC_OR_DEPARTMENT_OTHER): Payer: Self-pay

## 2022-11-30 ENCOUNTER — Inpatient Hospital Stay: Payer: Medicare Other | Admitting: Nurse Practitioner

## 2022-11-30 ENCOUNTER — Telehealth: Admit: 2022-11-30 | Payer: PRIVATE HEALTH INSURANCE | Attending: Clinical | Primary: Internal Medicine

## 2022-11-30 ENCOUNTER — Inpatient Hospital Stay: Admit: 2022-11-30 | Payer: PRIVATE HEALTH INSURANCE | Primary: Internal Medicine

## 2022-11-30 VITALS — BP 106/54 | HR 87

## 2022-11-30 VITALS — BP 96/58 | HR 108 | Temp 98.1°F | Resp 18 | Ht 67.0 in | Wt 138.0 lb

## 2022-11-30 DIAGNOSIS — C210 Malignant neoplasm of anus, unspecified: Secondary | ICD-10-CM

## 2022-11-30 DIAGNOSIS — Z79620 Long term (current) use of immunosuppressive biologic: Secondary | ICD-10-CM | POA: Insufficient documentation

## 2022-11-30 DIAGNOSIS — Z5112 Encounter for antineoplastic immunotherapy: Secondary | ICD-10-CM | POA: Insufficient documentation

## 2022-11-30 DIAGNOSIS — D649 Anemia, unspecified: Secondary | ICD-10-CM | POA: Insufficient documentation

## 2022-11-30 DIAGNOSIS — E860 Dehydration: Secondary | ICD-10-CM | POA: Diagnosis not present

## 2022-11-30 DIAGNOSIS — R627 Adult failure to thrive: Secondary | ICD-10-CM | POA: Diagnosis present

## 2022-11-30 DIAGNOSIS — R8290 Unspecified abnormal findings in urine: Secondary | ICD-10-CM | POA: Diagnosis not present

## 2022-11-30 DIAGNOSIS — C787 Secondary malignant neoplasm of liver and intrahepatic bile duct: Secondary | ICD-10-CM | POA: Insufficient documentation

## 2022-11-30 DIAGNOSIS — Z95828 Presence of other vascular implants and grafts: Secondary | ICD-10-CM

## 2022-11-30 LAB — CBC WITH DIFFERENTIAL (CANCER CENTER ONLY)
Abs Immature Granulocytes: 0.11 10*3/uL — ABNORMAL HIGH (ref 0.00–0.07)
Basophils Absolute: 0 10*3/uL (ref 0.0–0.1)
Basophils Relative: 0 %
Eosinophils Absolute: 0 10*3/uL (ref 0.0–0.5)
Eosinophils Relative: 0 %
HCT: 25.3 % — ABNORMAL LOW (ref 36.0–46.0)
Hemoglobin: 8.2 g/dL — ABNORMAL LOW (ref 12.0–15.0)
Immature Granulocytes: 2 %
Lymphocytes Relative: 4 %
Lymphs Abs: 0.3 10*3/uL — ABNORMAL LOW (ref 0.7–4.0)
MCH: 31.5 pg (ref 26.0–34.0)
MCHC: 32.4 g/dL (ref 30.0–36.0)
MCV: 97.3 fL (ref 80.0–100.0)
Monocytes Absolute: 0.5 10*3/uL (ref 0.1–1.0)
Monocytes Relative: 7 %
Neutro Abs: 6.1 10*3/uL (ref 1.7–7.7)
Neutrophils Relative %: 87 %
Platelet Count: 205 10*3/uL (ref 150–400)
RBC: 2.6 MIL/uL — ABNORMAL LOW (ref 3.87–5.11)
RDW: 14.4 % (ref 11.5–15.5)
WBC Count: 7 10*3/uL (ref 4.0–10.5)
nRBC: 0 % (ref 0.0–0.2)

## 2022-11-30 LAB — CMP (CANCER CENTER ONLY)
ALT: 5 U/L (ref 0–44)
AST: 9 U/L — ABNORMAL LOW (ref 15–41)
Albumin: 3.6 g/dL (ref 3.5–5.0)
Alkaline Phosphatase: 55 U/L (ref 38–126)
Anion gap: 12 (ref 5–15)
BUN: 17 mg/dL (ref 8–23)
CO2: 26 mmol/L (ref 22–32)
Calcium: 8.5 mg/dL — ABNORMAL LOW (ref 8.9–10.3)
Chloride: 100 mmol/L (ref 98–111)
Creatinine: 0.75 mg/dL (ref 0.44–1.00)
GFR, Estimated: >60 mL/min (ref >60)
Glucose, Bld: 92 mg/dL (ref 70–99)
Potassium: 3.5 mmol/L (ref 3.5–5.1)
Sodium: 138 mmol/L (ref 135–145)
Total Bilirubin: 0.7 mg/dL (ref 0.3–1.2)
Total Protein: 6.1 g/dL — ABNORMAL LOW (ref 6.5–8.1)

## 2022-11-30 LAB — ABO/RH: ABO/RH(D): A NEG

## 2022-11-30 LAB — PREPARE RBC (CROSSMATCH)

## 2022-11-30 MED ORDER — OXYCODONE-ACETAMINOPHEN 5-325 MG PO TABS
1.0000 | ORAL_TABLET | Freq: Four times a day (QID) | ORAL | 0 refills | Status: DC | PRN
Start: 2022-11-30 — End: 2022-12-07
  Filled 2022-11-30: qty 30, 4d supply, fill #0

## 2022-11-30 MED ORDER — HEPARIN SOD (PORK) LOCK FLUSH 100 UNIT/ML IV SOLN
500.0000 [IU] | Freq: Once | INTRAVENOUS | Status: AC
  Administered 2022-11-30: 500 [IU] via INTRAVENOUS

## 2022-11-30 MED ORDER — OXYCODONE HCL 5 MG PO TABS
5.0000 mg | ORAL_TABLET | Freq: Once | ORAL | Status: AC
  Administered 2022-11-30: 5 mg via ORAL
  Filled 2022-11-30: qty 1

## 2022-11-30 MED ORDER — SODIUM CHLORIDE 0.9% FLUSH
10.0000 mL | Freq: Once | INTRAVENOUS | Status: AC
  Administered 2022-11-30: 10 mL via INTRAVENOUS

## 2022-11-30 MED ORDER — SODIUM CHLORIDE 0.9 % IV SOLN: INTRAVENOUS | Status: DC

## 2022-11-30 NOTE — Progress Notes (Unsigned)
[facility] OFFICE PROGRESS NOTE   Diagnosis: Anal cancer  INTERVAL HISTORY:   Ms. Jokinen returns as scheduled.  She completed cycle 1 Taxol/carboplatin 11/03/2022.  Treatment was held last week due to dehydration.  She has increased fluid intake.  She has difficulty eating due to concern for constipation.  Bowels continue to alternate constipation and diarrhea.  She notes bleeding with bowel movements.  She notes significant burning in the perianal region following urination.  She has pain in multiple areas.  Objective:  Vital signs in last 24 hours:  Blood pressure (!) 96/58, pulse (!) 108, temperature 98.1 F (36.7 C), temperature source Oral, resp. rate 18, height 5\' 7"  (1.702 m), weight 138 lb (62.6 kg), SpO2 98%.    HEENT: No thrush or ulcers. Resp: Lungs clear bilaterally. Cardio: Regular rate and rhythm. GI: No hepatosplenomegaly.  Masslike firmness right anterior anal verge extending into the anal canal, associated tenderness.  Soft hemorrhoid/skin tag below the mass.  Blood present perianal skin and in underwear. Vascular: Trace edema right lower leg. Skin: Ecchymoses at both lower legs/feet. Port-A-Cath without erythema.  Lab Results:  Lab Results  Component Value Date   WBC 7.0 11/30/2022   HGB 8.2 (L) 11/30/2022   HCT 25.3 (L) 11/30/2022   MCV 97.3 11/30/2022   PLT 205 11/30/2022   NEUTROABS 6.1 11/30/2022    Imaging:  No results found.  Medications: I have reviewed the patient's current medications.  Assessment/Plan: Anal cancer Anal mass noted on digital exam and colonoscopy 05/09/2022-biopsy consistent with squamous cell carcinoma CTs 05/12/2022-no evidence of thoracic metastatic disease, calcified right pleural plaques, asymmetric thickening of the right posterior wall of the distal rectum/anal canal, no evidence of metastatic adenopathy in the abdomen or pelvis, 2 small hypodense left liver lesions favored to represent benign cysts PET  scan 05/30/2022-intense hypermetabolic activity at the anus.  No evidence of metastatic adenopathy.  Indeterminate small, mildly hypermetabolic lesion in the lateral segment of the left hepatic lobe. Radiation 06/06/2022-07/19/2022 Cycle one 5-FU/Mitomycin-C 06/06/2022 cycle two 5-FU/Mitomycin-C 07/04/2022 (5-FU dose reduced secondary to diarrhea) MRI liver 06/15/2022-1 cm hypervascular mass in segment 2 of the left hepatic lobe, corresponds to the focus of hypermetabolism on recent PET-CT.  Imaging characteristics atypical for metastatic squamous cell carcinoma.  Follow-up MRI in 3 months Cycle two 5-FU/Mitomycin-C 07/04/2022 (5-FU dose reduced secondary to diarrhea) 08/29/2022 anoscopy, Dr. Maisie Fus, smaller posterior midline rectal mass approximately in the dentate line MRI liver 09/12/2022-enlargement of a complex cystic and solid-appearing enhancing lesion in segment 2 of the liver, 4 x 2.2 cm compared to 1.5 cm on previous study PET scan 09/23/2022-interval increase in size and tracer avidity of a solitary metastasis within the lateral segment of left hepatic lobe.  Tracer avid lesion involving the anus again noted, slightly decreased in size and tracer avidity. 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 Modesta Messing 10/10/2022, presented at multidisciplinary clinic-recommendation for 3 months of treatment with systemic therapy to address the liver followed by restaging scan and also MRI of the pelvis to assess the primary site  Cycle 1 Taxol/carboplatin 11/03/2022 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 Syncopal  episode 11/19/2022-question if due  to dehydration.  Disposition: Ms. Rumple has completed 1 cycle of Taxol/carboplatin.  Treatment was held last week due to a syncopal episode resulting in a fall with fracture of the posterior distal fibula.  We were concerned for dehydration.  She has persistent failure to thrive.  Chemotherapy will be held today.  She will receive IV fluids.  CBC from today shows progressive anemia.  This is likely multifactorial due to rectal bleeding, chemotherapy, recent right leg injury.  We are making arrangements for a blood transfusion.  We discussed potential side effects associated with a blood transfusion including a transfusion reaction and infection.  She agrees to proceed.  She is having significant rectal pain.  She appears to have disease progression at the anus.  We will prescribe Percocet 1 to 2 tablets every 6 hours as needed.  She will return for lab and follow-up in approximately 1 week with the plan for cycle 2 Taxol/carboplatin if performance status will allow.  We discussed switching treatment to immunotherapy if she is unable to tolerate chemotherapy.  Patient seen with Dr. Truett Perna.  Lonna Cobb ANP/GNP-BC   11/30/2022  10:28 AM This was a shared visit with Lonna Cobb.  Ms. Pinera was interviewed and examined.  She completed 1 cycle of Taxol/carboplatin for treatment of metastatic anal cancer.  She has failure to thrive and increased rectal pain.  She has local recurrence of tumor at the anus. She is not a candidate for further chemotherapy unless her performance status improves.  She will receive intravenous fluids and a Red cell transfusion.  She will be prescribed Percocet to use as needed for pain.  We will change systemic therapy to nivolumab if her performance status does not improve.  I was present for greater than 50% of today's visit.  I performed medical decision making.  Mancel Bale, MD

## 2022-11-30 NOTE — Progress Notes (Unsigned)
Patient seen by Lonna Cobb NP today  Vitals are not all within treatment parameters.    Please monitor the patient's vital signs at 500cc and again at the end of the session. The current blood pressure reading is 96/58. Kindly report your findings. Thank you.  Labs reviewed by Lonna Cobb NP and are within treatment parameters.  Per physician team, patient will not be receiving treatment today. The patient will be receiving 1 liter of IVF. The order has been placed.

## 2022-11-30 NOTE — Patient Instructions (Signed)

## 2022-11-30 NOTE — Progress Notes (Signed)
VS checked and reported to Lonna Cobb, NP halfway through fluids and at the completion of fluids.  Patient reported feeling better, no complaints of dizziness.  Patient encouraged to continue to hydrate at home.  Patient verbalized understanding.  Per Lonna Cobb, NP ok to discharge patient home. Patient is scheduled for blood transfusion on 12/01/22 and was reminded to keep on blue blood bank bracelet for her appointment on 12/01/22.   Patient verbalized understanding.

## 2022-11-30 NOTE — Telephone Encounter (Signed)
[facility] has order placed and  [facility], which has confirmed the request. An email has been sent to provide further confirmation.

## 2022-11-30 NOTE — Patient Instructions (Signed)

## 2022-11-30 NOTE — Telephone Encounter
Called pt as she did not show for her scheduled intake today at 11:00 AM. Left VM requesting a call back if she would like to reschedule.

## 2022-12-01 ENCOUNTER — Encounter: Payer: Self-pay | Admitting: Oncology

## 2022-12-01 ENCOUNTER — Inpatient Hospital Stay: Payer: Medicare Other

## 2022-12-01 DIAGNOSIS — C210 Malignant neoplasm of anus, unspecified: Secondary | ICD-10-CM

## 2022-12-01 DIAGNOSIS — Z5112 Encounter for antineoplastic immunotherapy: Secondary | ICD-10-CM | POA: Diagnosis not present

## 2022-12-01 MED ORDER — HEPARIN SOD (PORK) LOCK FLUSH 100 UNIT/ML IV SOLN
500.0000 [IU] | Freq: Every day | INTRAVENOUS | Status: AC | PRN
  Administered 2022-12-01: 500 [IU]

## 2022-12-01 MED ORDER — SODIUM CHLORIDE 0.9% IV SOLUTION
250.0000 mL | Freq: Once | INTRAVENOUS | Status: AC
  Administered 2022-12-01: 250 mL via INTRAVENOUS

## 2022-12-01 MED ORDER — SODIUM CHLORIDE 0.9% FLUSH
10.0000 mL | INTRAVENOUS | Status: AC | PRN
  Administered 2022-12-01: 10 mL

## 2022-12-01 NOTE — Patient Instructions (Signed)

## 2022-12-02 LAB — TYPE AND SCREEN
ABO/RH(D): A NEG
Antibody Screen: NEGATIVE
Unit division: 0
Unit division: 0

## 2022-12-02 LAB — BPAM RBC
Blood Product Expiration Date: 202409202359
Blood Product Expiration Date: 202409222359
ISSUE DATE / TIME: 202409050813
ISSUE DATE / TIME: 202409050813
Unit Type and Rh: 600
Unit Type and Rh: 600

## 2022-12-06 ENCOUNTER — Other Ambulatory Visit (HOSPITAL_BASED_OUTPATIENT_CLINIC_OR_DEPARTMENT_OTHER): Payer: Self-pay

## 2022-12-07 ENCOUNTER — Encounter: Payer: Self-pay | Admitting: Nurse Practitioner

## 2022-12-07 ENCOUNTER — Other Ambulatory Visit (HOSPITAL_BASED_OUTPATIENT_CLINIC_OR_DEPARTMENT_OTHER): Payer: Self-pay

## 2022-12-07 ENCOUNTER — Inpatient Hospital Stay: Payer: Medicare Other | Admitting: Nurse Practitioner

## 2022-12-07 ENCOUNTER — Inpatient Hospital Stay: Payer: Medicare Other

## 2022-12-07 ENCOUNTER — Encounter: Payer: Self-pay | Admitting: Oncology

## 2022-12-07 VITALS — BP 93/67 | HR 104 | Temp 98.1°F | Resp 18 | Ht 67.0 in | Wt 137.0 lb

## 2022-12-07 DIAGNOSIS — Z95828 Presence of other vascular implants and grafts: Secondary | ICD-10-CM

## 2022-12-07 DIAGNOSIS — C210 Malignant neoplasm of anus, unspecified: Secondary | ICD-10-CM

## 2022-12-07 DIAGNOSIS — Z5112 Encounter for antineoplastic immunotherapy: Secondary | ICD-10-CM | POA: Diagnosis not present

## 2022-12-07 LAB — CMP (CANCER CENTER ONLY)
ALT: 6 U/L (ref 0–44)
AST: 11 U/L — ABNORMAL LOW (ref 15–41)
Albumin: 3.2 g/dL — ABNORMAL LOW (ref 3.5–5.0)
Alkaline Phosphatase: 66 U/L (ref 38–126)
Anion gap: 8 (ref 5–15)
BUN: 12 mg/dL (ref 8–23)
CO2: 31 mmol/L (ref 22–32)
Calcium: 8.3 mg/dL — ABNORMAL LOW (ref 8.9–10.3)
Chloride: 98 mmol/L (ref 98–111)
Creatinine: 0.59 mg/dL (ref 0.44–1.00)
GFR, Estimated: >60 mL/min (ref >60)
Glucose, Bld: 101 mg/dL — ABNORMAL HIGH (ref 70–99)
Potassium: 3.6 mmol/L (ref 3.5–5.1)
Sodium: 137 mmol/L (ref 135–145)
Total Bilirubin: 0.6 mg/dL (ref 0.3–1.2)
Total Protein: 6.2 g/dL — ABNORMAL LOW (ref 6.5–8.1)

## 2022-12-07 LAB — CBC WITH DIFFERENTIAL (CANCER CENTER ONLY)
Abs Immature Granulocytes: 0.05 10*3/uL (ref 0.00–0.07)
Basophils Absolute: 0 10*3/uL (ref 0.0–0.1)
Basophils Relative: 0 %
Eosinophils Absolute: 0 10*3/uL (ref 0.0–0.5)
Eosinophils Relative: 1 %
HCT: 32 % — ABNORMAL LOW (ref 36.0–46.0)
Hemoglobin: 10.5 g/dL — ABNORMAL LOW (ref 12.0–15.0)
Immature Granulocytes: 1 %
Lymphocytes Relative: 5 %
Lymphs Abs: 0.3 10*3/uL — ABNORMAL LOW (ref 0.7–4.0)
MCH: 32.1 pg (ref 26.0–34.0)
MCHC: 32.8 g/dL (ref 30.0–36.0)
MCV: 97.9 fL (ref 80.0–100.0)
Monocytes Absolute: 0.7 10*3/uL (ref 0.1–1.0)
Monocytes Relative: 10 %
Neutro Abs: 5.2 10*3/uL (ref 1.7–7.7)
Neutrophils Relative %: 83 %
Platelet Count: 267 10*3/uL (ref 150–400)
RBC: 3.27 MIL/uL — ABNORMAL LOW (ref 3.87–5.11)
RDW: 14.9 % (ref 11.5–15.5)
WBC Count: 6.3 10*3/uL (ref 4.0–10.5)
nRBC: 0 % (ref 0.0–0.2)

## 2022-12-07 MED ORDER — SODIUM CHLORIDE 0.9% FLUSH
10.0000 mL | Freq: Once | INTRAVENOUS | Status: AC
  Administered 2022-12-07: 10 mL via INTRAVENOUS

## 2022-12-07 MED ORDER — OXYCODONE-ACETAMINOPHEN 5-325 MG PO TABS
1.0000 | ORAL_TABLET | Freq: Four times a day (QID) | ORAL | 0 refills | Status: DC | PRN
  Filled 2022-12-07: qty 60, 8d supply, fill #0

## 2022-12-07 MED ORDER — HEPARIN SOD (PORK) LOCK FLUSH 100 UNIT/ML IV SOLN
500.0000 [IU] | Freq: Once | INTRAVENOUS | Status: AC
  Administered 2022-12-07: 500 [IU] via INTRAVENOUS

## 2022-12-07 NOTE — Progress Notes (Signed)
[facility] OFFICE PROGRESS NOTE   Diagnosis: Anal cancer  INTERVAL HISTORY:   Alyssa Deleon returns as scheduled.  She completed cycle 1 Taxol/carboplatin 11/03/2022.  Treatment has been on hold due to persistent failure to thrive.  She thinks she is doing better with oral intake.  Her daughter estimates she eats 1 meal a day and drinks a few Ensure.  Water intake has improved.  No recent falls.  No nausea.  She continues to have rectal pain.  She notes blood and mucus from the rectum,.  Objective:  Vital signs in last 24 hours:  Blood pressure 93/67, pulse (!) 104, temperature 98.1 F (36.7 C), temperature source Temporal, resp. rate 18, height 5\' 7"  (1.702 m), weight 137 lb (62.1 kg), SpO2 99%.    HEENT: No thrush or ulcers. Resp: Distant breath sounds. Cardio: Regular rate and rhythm. GI: No hepatosplenomegaly. Vascular: Trace edema right lower leg/foot. Port-A-Cath without erythema.  Lab Results:  Lab Results  Component Value Date   WBC 6.3 12/07/2022   HGB 10.5 (L) 12/07/2022   HCT 32.0 (L) 12/07/2022   MCV 97.9 12/07/2022   PLT 267 12/07/2022   NEUTROABS 5.2 12/07/2022    Imaging:  No results found.  Medications: I have reviewed the patient's current medications.  Assessment/Plan: Anal cancer Anal mass noted on digital exam and colonoscopy 05/09/2022-biopsy consistent with squamous cell carcinoma CTs 05/12/2022-no evidence of thoracic metastatic disease, calcified right pleural plaques, asymmetric thickening of the right posterior wall of the distal rectum/anal canal, no evidence of metastatic adenopathy in the abdomen or pelvis, 2 small hypodense left liver lesions favored to represent benign cysts PET scan 05/30/2022-intense hypermetabolic activity at the anus.  No evidence of metastatic adenopathy.  Indeterminate small, mildly hypermetabolic lesion in the lateral segment of the left hepatic lobe. Radiation 06/06/2022-07/19/2022 Cycle one  5-FU/Mitomycin-C 06/06/2022 cycle two 5-FU/Mitomycin-C 07/04/2022 (5-FU dose reduced secondary to diarrhea) MRI liver 06/15/2022-1 cm hypervascular mass in segment 2 of the left hepatic lobe, corresponds to the focus of hypermetabolism on recent PET-CT.  Imaging characteristics atypical for metastatic squamous cell carcinoma.  Follow-up MRI in 3 months Cycle two 5-FU/Mitomycin-C 07/04/2022 (5-FU dose reduced secondary to diarrhea) 08/29/2022 anoscopy, Dr. Maisie Fus, smaller posterior midline rectal mass approximately in the dentate line MRI liver 09/12/2022-enlargement of a complex cystic and solid-appearing enhancing lesion in segment 2 of the liver, 4 x 2.2 cm compared to 1.5 cm on previous study PET scan 09/23/2022-interval increase in size and tracer avidity of a solitary metastasis within the lateral segment of left hepatic lobe.  Tracer avid lesion involving the anus again noted, slightly decreased in size and tracer avidity. 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 Modesta Messing 10/10/2022, presented at multidisciplinary clinic-recommendation for 3 months of treatment with systemic therapy to address the liver followed by restaging scan and also MRI of the pelvis to assess the primary site  Cycle 1 Taxol/carboplatin 11/03/2022 12/07/2022 chemotherapy discontinued due to poor performance status Cycle 1 nivolumab planned 12/12/2022 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 Syncopal episode 11/19/2022-question if due to dehydration.  Disposition: Alyssa Deleon completed 1 cycle of Taxol/carboplatin on 11/03/2022.   Performance status  continues to be poor.  In her current condition she does not appear to be a candidate for additional chemotherapy.  This was reviewed with her and her daughter at today's visit.  Dr. Kalman Drape recommendation is to begin nivolumab every 2 weeks.  We reviewed potential side effects including rash, diarrhea, endocrinopathies, neurologic toxicities, pneumonitis, hepatitis, arthritis, brain inflammation.  She agrees to proceed.  We provided written information on nivolumab.  She will return for cycle 1 nivolumab 12/12/2022.  We will see her in follow-up prior to cycle 2.  Patient seen with Dr. Truett Perna.  Lonna Cobb ANP/GNP-BC   12/07/2022  12:12 PM This was a shared visit with Lonna Cobb.  Alyssa Deleon was interviewed and examined.  She continues to have a poor performance status.  There is clinical evidence of disease progression following 1 cycle of paclitaxel/carboplatin.  She does not appear to be a candidate for further chemotherapy.  We discussed comfort care versus a trial of immunotherapy.  She would like to proceed with nivolumab.  We reviewed potential toxicities associated with nivolumab.  The plan is to begin nivolumab on an every 2-week schedule.  We encouraged her to continue a stool softener and begin MiraLAX for constipation.  I was present for greater than 50% of today's visit.  I performed medical decision making.  A treatment plan was entered today.  Mancel Bale, MD

## 2022-12-07 NOTE — Progress Notes (Signed)
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     A cycle is every 28 days:     Paclitaxel      Carboplatin   **Always confirm dose/schedule in your pharmacy ordering system**  REASON: Disease Progression PRIOR TREATMENT: ANLOS06: Carboplatin AUC=5 D1 + Paclitaxel 80 mg/m2 D1, 8, 15 q28 Days x 6 Cycles TREATMENT RESPONSE: Progressive Disease (PD)  START ON PATHWAY REGIMEN - Anal Carcinoma     A cycle is every 14 days:     Nivolumab   **Always confirm dose/schedule in your pharmacy ordering system**  Patient Characteristics: Anal Canal Tumors, Distant Metastases / Local Recurrence - Unresectable, Second Line and Beyond Therapeutic Status: Distant Metastases Line of therapy: Second Line and Beyond  Intent of Therapy: Curative Intent, Discussed with Patient

## 2022-12-07 NOTE — Patient Instructions (Signed)

## 2022-12-08 ENCOUNTER — Telehealth: Payer: Self-pay

## 2022-12-08 ENCOUNTER — Other Ambulatory Visit: Payer: Self-pay

## 2022-12-08 ENCOUNTER — Inpatient Hospital Stay: Payer: Medicare Other

## 2022-12-08 DIAGNOSIS — C210 Malignant neoplasm of anus, unspecified: Secondary | ICD-10-CM

## 2022-12-08 NOTE — Telephone Encounter (Signed)
I have sent a referral for physical therapy to [facility] at [phone] via fax.

## 2022-12-09 ENCOUNTER — Inpatient Hospital Stay: Payer: Medicare Other

## 2022-12-09 ENCOUNTER — Encounter: Admit: 2022-12-09 | Payer: PRIVATE HEALTH INSURANCE | Attending: Hematology | Primary: Internal Medicine

## 2022-12-09 ENCOUNTER — Ambulatory Visit: Admit: 2022-12-09 | Payer: BLUE CROSS/BLUE SHIELD | Attending: Hematology | Primary: Internal Medicine

## 2022-12-09 DIAGNOSIS — Z720 Tobacco use: Secondary | ICD-10-CM

## 2022-12-09 DIAGNOSIS — B1920 Unspecified viral hepatitis C without hepatic coma: Secondary | ICD-10-CM

## 2022-12-09 DIAGNOSIS — J449 Chronic obstructive pulmonary disease, unspecified: Secondary | ICD-10-CM

## 2022-12-09 DIAGNOSIS — C3490 Malignant neoplasm of unspecified part of unspecified bronchus or lung: Secondary | ICD-10-CM

## 2022-12-09 DIAGNOSIS — F419 Anxiety disorder, unspecified: Secondary | ICD-10-CM

## 2022-12-09 DIAGNOSIS — R413 Other amnesia: Secondary | ICD-10-CM

## 2022-12-09 DIAGNOSIS — F32A Depression: Secondary | ICD-10-CM

## 2022-12-09 NOTE — Progress Notes (Signed)
Pharmacist Chemotherapy Monitoring - Initial Assessment    Anticipated start date: 12/12/22   The following has been reviewed per standard work regarding the patient's treatment regimen: The patient's diagnosis, treatment plan and drug doses, and organ/hematologic function Lab orders and baseline tests specific to treatment regimen  The treatment plan start date, drug sequencing, and pre-medications Prior authorization status  Patient's documented medication list, including drug-drug interaction screen and prescriptions for anti-emetics and supportive care specific to the treatment regimen The drug concentrations, fluid compatibility, administration routes, and timing of the medications to be used The patient's access for treatment and lifetime cumulative dose history, if applicable  The patient's medication allergies and previous infusion related reactions, if applicable   Changes made to treatment plan:  N/A  Follow up needed:  N/A   Daylene Katayama, RPH, 12/09/2022  11:45 AM

## 2022-12-09 NOTE — Progress Notes
 Re: Courtney Knox (1955/04/02)MRN: [id number] Provider: Jaclyn Prime, MDDate of Service: 8/2/2024TREATMENT SUMMARY NOTEREFERRING PHYSICIAN: Dr. Tommie Sams: NSCLCSTAGE:cT3N3HPI, PHYSICAL EXAMINATION, AND TREATMENT POLICY: [name] is a 67 y.o. 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  Please see our original note for full history. I presented the case at our multi-D thoracic tumor board. The consensus was to proceed with definitive RT to the cT3N0 NSCLC of the RUL and to follow the LUL lesion. Should this progress we will pursue ION bronch guided biopsy of this lesion.  07/21/22 EBUS: level 7, 11L, and 11R all negative for malignant cells (positive for polymorphous lymphocytes). 08/04/22 [place] guided RUL biopsy: squamous cell carcinoma. PD-L1 TPS 5%.  Today she notes continued right posterior chest wall pain. No changes in breathing.  A/P; The patient has a cT3N0 of the RUL with significant chest wall invasion and invasion of ribs. This is causing her pain and as such we will plan to proceed with [place] sim today to avoid further delay and expedite RT planning as RT should help with pain. Given significant chest wall invasion I would not recommend SBRT and would rather proceed with conventionally fractionated RT with chemotherapy. I have discussed this with Dr. Maxcine Ham and he agrees with this plan. We will follow the LUL area.  Consent signed . [place] sim today. Upon tx planning and tumor board review a N3 SCV node was appreciated and this was treated.  SUMMARY OF RADIATION THERAPY:Treatment Site Ref. ID Energy Dose/Fx (cGy) #Fx Dose Correction (cGy) Total Dose (cGy) Start Date End Date Elapsed Days Plan_0 PTV 6000 6X 200 30 / 30 0 6,000 09/06/2022 10/28/2022 52 CLINICAL COURSE AND DISPOSITION:Patient had a difficult time with treatment. Concurrent chemotherapy was stopped. She had progressive memory and congitive issues that resulted in missed treatments and hospitalizations. Ultimately she completed RT. [facility] in 2 weeks. Continue f/u with med onc. Jaclyn Prime, MD

## 2022-12-12 ENCOUNTER — Other Ambulatory Visit: Payer: Self-pay | Admitting: Nurse Practitioner

## 2022-12-12 ENCOUNTER — Inpatient Hospital Stay: Payer: Medicare Other

## 2022-12-12 ENCOUNTER — Encounter: Admit: 2022-12-12 | Payer: PRIVATE HEALTH INSURANCE | Attending: Hematology | Primary: Internal Medicine

## 2022-12-12 VITALS — BP 92/70 | HR 104 | Temp 98.2°F | Resp 18 | Ht 67.0 in | Wt 133.8 lb

## 2022-12-12 DIAGNOSIS — C210 Malignant neoplasm of anus, unspecified: Secondary | ICD-10-CM

## 2022-12-12 DIAGNOSIS — Z5112 Encounter for antineoplastic immunotherapy: Secondary | ICD-10-CM | POA: Diagnosis not present

## 2022-12-12 MED ORDER — HEPARIN SOD (PORK) LOCK FLUSH 100 UNIT/ML IV SOLN
500.0000 [IU] | Freq: Once | INTRAVENOUS | Status: AC | PRN
  Administered 2022-12-12: 500 [IU]

## 2022-12-12 MED ORDER — SODIUM CHLORIDE 0.9% FLUSH
10.0000 mL | INTRAVENOUS | Status: DC | PRN
  Administered 2022-12-12: 10 mL

## 2022-12-12 MED ORDER — SODIUM CHLORIDE 0.9 % IV SOLN
240.0000 mg | Freq: Once | INTRAVENOUS | Status: AC
  Administered 2022-12-12: 240 mg via INTRAVENOUS
  Filled 2022-12-12: qty 24

## 2022-12-12 MED ORDER — SODIUM CHLORIDE 0.9 % IV SOLN: Freq: Once | INTRAVENOUS | Status: AC

## 2022-12-12 NOTE — Patient Instructions (Addendum)
[facility]  Discharge Instructions: Thank you for choosing [facility] to provide your oncology and hematology care.   If you have a lab appointment with the Cancer Center, please go directly to the Cancer Center and check in at the registration area.   Wear comfortable clothing and clothing appropriate for easy access to any Portacath or PICC line.   We strive to give you quality time with your provider. You may need to reschedule your appointment if you arrive late (15 or more minutes).  Arriving late affects you and other patients whose appointments are after yours.  Also, if you miss three or more appointments without notifying the office, you may be dismissed from the clinic at the provider's discretion.      For prescription refill requests, have your pharmacy contact our office and allow 72 hours for refills to be completed.    Today you received the following chemotherapy and/or immunotherapy agents: nivolumab     To help prevent nausea and vomiting after your treatment, we encourage you to take your nausea medication as directed.  BELOW ARE SYMPTOMS THAT SHOULD BE REPORTED IMMEDIATELY: *FEVER GREATER THAN 100.4 F (38 C) OR HIGHER *CHILLS OR SWEATING *NAUSEA AND VOMITING THAT IS NOT CONTROLLED WITH YOUR NAUSEA MEDICATION *UNUSUAL SHORTNESS OF BREATH *UNUSUAL BRUISING OR BLEEDING *URINARY PROBLEMS (pain or burning when urinating, or frequent urination) *BOWEL PROBLEMS (unusual diarrhea, constipation, pain near the anus) TENDERNESS IN MOUTH AND THROAT WITH OR WITHOUT PRESENCE OF ULCERS (sore throat, sores in mouth, or a toothache) UNUSUAL RASH, SWELLING OR PAIN  UNUSUAL VAGINAL DISCHARGE OR ITCHING   Items with * indicate a potential emergency and should be followed up as soon as possible or go to the Emergency Department if any problems should occur.  Please show the CHEMOTHERAPY ALERT CARD or IMMUNOTHERAPY ALERT CARD at check-in  to the Emergency Department and triage nurse.  Should you have questions after your visit or need to cancel or reschedule your appointment, please contact [facility]  Dept: [phone]  and follow the prompts.  Office hours are 8:00 a.m. to 4:30 p.m. Monday - Friday. Please note that voicemails left after 4:00 p.m. may not be returned until the following business day.  We are closed weekends and major holidays. You have access to a nurse at all times for urgent questions. Please call the main number to the clinic Dept: [phone] and follow the prompts.   For any non-urgent questions, you may also contact your provider using MyChart. We now offer e-Visits for anyone 4 and older to request care online for non-urgent symptoms. For details visit [email or web address].   Also download the MyChart app! Go to the app store, search "MyChart", open the app, select [place], and log in with your MyChart username and password.  Nivolumab Injection What is this medication? NIVOLUMAB (nye VOL ue mab) treats some types of cancer. It works by helping your immune system slow or stop the spread of cancer cells. It is a monoclonal antibody. This medicine may be used for other purposes; ask your health care provider or pharmacist if you have questions. COMMON BRAND NAME(S): Opdivo What should I tell my care team before I take this medication? They need to know if you have any of these conditions: Allogeneic stem cell transplant (uses someone else's stem cells) Autoimmune diseases, such as Crohn disease, ulcerative colitis, lupus History of chest radiation Nervous system problems, such as  Guillain-Barre syndrome or myasthenia gravis Organ transplant An unusual or allergic reaction to nivolumab, other medications, foods, dyes, or preservatives Pregnant or trying to get pregnant Breast-feeding How should I use this medication? This medication is infused into a vein.  It is given in a hospital or clinic setting. A special MedGuide will be given to you before each treatment. Be sure to read this information carefully each time. Talk to your care team about the use of this medication in children. While it may be prescribed for children as young as 12 years for selected conditions, precautions do apply. Overdosage: If you think you have taken too much of this medicine contact a poison control center or emergency room at once. NOTE: This medicine is only for you. Do not share this medicine with others. What if I miss a dose? Keep appointments for follow-up doses. It is important not to miss your dose. Call your care team if you are unable to keep an appointment. What may interact with this medication? Interactions have not been studied. This list may not describe all possible interactions. Give your health care provider a list of all the medicines, herbs, non-prescription drugs, or dietary supplements you use. Also tell them if you smoke, drink alcohol, or use illegal drugs. Some items may interact with your medicine. What should I watch for while using this medication? Your condition will be monitored carefully while you are receiving this medication. You may need blood work while taking this medication. This medication may cause serious skin reactions. They can happen weeks to months after starting the medication. Contact your care team right away if you notice fevers or flu-like symptoms with a rash. The rash may be red or purple and then turn into blisters or peeling of the skin. You may also notice a red rash with swelling of the face, lips, or lymph nodes in your neck or under your arms. Tell your care team right away if you have any change in your eyesight. Talk to your care team if you are pregnant or think you might be pregnant. A negative pregnancy test is required before starting this medication. A reliable form of contraception is recommended while taking  this medication and for 5 months after the last dose. Talk to your care team about effective forms of contraception. Do not breast-feed while taking this medication and for 5 months after the last dose. What side effects may I notice from receiving this medication? Side effects that you should report to your care team as soon as possible: Allergic reactions--skin rash, itching, hives, swelling of the face, lips, tongue, or throat Dry cough, shortness of breath or trouble breathing Eye pain, redness, irritation, or discharge with blurry or decreased vision Heart muscle inflammation--unusual weakness or fatigue, shortness of breath, chest pain, fast or irregular heartbeat, dizziness, swelling of the ankles, feet, or hands Hormone gland problems--headache, sensitivity to light, unusual weakness or fatigue, dizziness, fast or irregular heartbeat, increased sensitivity to cold or heat, excessive sweating, constipation, hair loss, increased thirst or amount of urine, tremors or shaking, irritability Infusion reactions--chest pain, shortness of breath or trouble breathing, feeling faint or lightheaded Kidney injury (glomerulonephritis)--decrease in the amount of urine, red or dark brown urine, foamy or bubbly urine, swelling of the ankles, hands, or feet Liver injury--right upper belly pain, loss of appetite, nausea, light-colored stool, dark yellow or brown urine, yellowing skin or eyes, unusual weakness or fatigue Pain, tingling, or numbness in the hands or feet, muscle weakness, change  in vision, confusion or trouble speaking, loss of balance or coordination, trouble walking, seizures Rash, fever, and swollen lymph nodes Redness, blistering, peeling, or loosening of the skin, including inside the mouth Sudden or severe stomach pain, bloody diarrhea, fever, nausea, vomiting Side effects that usually do not require medical attention (report these to your care team if they continue or are  bothersome): Bone, joint, or muscle pain Diarrhea Fatigue Loss of appetite Nausea Skin rash This list may not describe all possible side effects. Call your doctor for medical advice about side effects. You may report side effects to FDA at 1-800-FDA-1088. Where should I keep my medication? This medication is given in a hospital or clinic. It will not be stored at home. NOTE: This sheet is a summary. It may not cover all possible information. If you have questions about this medicine, talk to your doctor, pharmacist, or health care provider.  2024 Elsevier/Gold Standard (2021-07-12 00:00:00)

## 2022-12-12 NOTE — Other
 DISCONTINUE ON PATHWAY REGIMEN - Non-Small Cell Lung    Paclitaxel     Carboplatin **Always confirm dose/schedule in your pharmacy ordering system**REASON: Continuation Of TreatmentPRIOR TREATMENT: [id number]: Carboplatin AUC=2 + Paclitaxel 45 mg/m2 Weekly During RadiationTREATMENT RESPONSE: Unable to EvaluateSTART ON PATHWAY REGIMEN - Non-Small Cell Lung    Durvalumab (Imfinzi) **Always confirm dose/schedule in your pharmacy ordering system**Patient Characteristics:Preoperative or Nonsurgical Candidate (Clinical Staging), Stage III - Nonsurgical Candidate (Nonsquamous and Squamous), PS = 0, 1Therapeutic Status: Preoperative or Nonsurgical Candidate (Clinical Staging)AJCC T Category: cTXAJCC N Category: cNXAJCC M Category: cM0AJCC 8 Stage Grouping: IIIAECOG Performance Status: 1

## 2022-12-13 ENCOUNTER — Telehealth: Payer: Self-pay

## 2022-12-13 ENCOUNTER — Encounter: Admit: 2022-12-13 | Payer: PRIVATE HEALTH INSURANCE | Attending: Internal Medicine | Primary: Internal Medicine

## 2022-12-13 NOTE — Telephone Encounter (Signed)
24 Hour Call Back  Telephone call to patient post her first time Nivolumab treatment. Unable to reach patient, left voice message.

## 2022-12-14 ENCOUNTER — Telehealth: Payer: Self-pay | Admitting: *Deleted

## 2022-12-14 ENCOUNTER — Other Ambulatory Visit: Payer: Self-pay | Admitting: *Deleted

## 2022-12-14 DIAGNOSIS — C210 Malignant neoplasm of anus, unspecified: Secondary | ICD-10-CM

## 2022-12-14 NOTE — Telephone Encounter (Signed)
Daughter called to report patient told her today that she feels very weak. Spends all day on sofa or recliner. Some shortness of breath with ambulation. Continues to have rectal bleeding even when not having bowel movement (wears diaper). She also reports she is getting more confused and inappropriate. Takes xanax at bedtime and oxycodone 1 1/2 tablets every 6 hours for pain.

## 2022-12-15 ENCOUNTER — Telehealth: Payer: Self-pay | Admitting: Physician Assistant

## 2022-12-15 ENCOUNTER — Other Ambulatory Visit: Payer: Self-pay

## 2022-12-15 ENCOUNTER — Inpatient Hospital Stay: Payer: Medicare Other | Admitting: Nurse Practitioner

## 2022-12-15 ENCOUNTER — Inpatient Hospital Stay: Payer: Medicare Other

## 2022-12-15 ENCOUNTER — Encounter: Payer: Self-pay | Admitting: Nurse Practitioner

## 2022-12-15 ENCOUNTER — Telehealth: Payer: Self-pay

## 2022-12-15 ENCOUNTER — Encounter: Admit: 2022-12-15 | Payer: BLUE CROSS/BLUE SHIELD | Attending: Internal Medicine | Primary: Internal Medicine

## 2022-12-15 ENCOUNTER — Encounter: Admit: 2022-12-15 | Payer: PRIVATE HEALTH INSURANCE | Attending: Internal Medicine | Primary: Internal Medicine

## 2022-12-15 VITALS — BP 107/62 | HR 105

## 2022-12-15 VITALS — BP 96/61 | HR 100 | Temp 98.1°F | Resp 18 | Ht 67.0 in | Wt 133.0 lb

## 2022-12-15 DIAGNOSIS — Z5112 Encounter for antineoplastic immunotherapy: Secondary | ICD-10-CM | POA: Diagnosis not present

## 2022-12-15 DIAGNOSIS — C210 Malignant neoplasm of anus, unspecified: Secondary | ICD-10-CM

## 2022-12-15 DIAGNOSIS — Z95828 Presence of other vascular implants and grafts: Secondary | ICD-10-CM

## 2022-12-15 LAB — CMP (CANCER CENTER ONLY)
ALT: 11 U/L (ref 0–44)
AST: 14 U/L — ABNORMAL LOW (ref 15–41)
Albumin: 3.5 g/dL (ref 3.5–5.0)
Alkaline Phosphatase: 75 U/L (ref 38–126)
Anion gap: 9 (ref 5–15)
BUN: 15 mg/dL (ref 8–23)
CO2: 28 mmol/L (ref 22–32)
Calcium: 8.6 mg/dL — ABNORMAL LOW (ref 8.9–10.3)
Chloride: 99 mmol/L (ref 98–111)
Creatinine: 0.62 mg/dL (ref 0.44–1.00)
GFR, Estimated: >60 mL/min (ref >60)
Glucose, Bld: 129 mg/dL — ABNORMAL HIGH (ref 70–99)
Potassium: 3.5 mmol/L (ref 3.5–5.1)
Sodium: 136 mmol/L (ref 135–145)
Total Bilirubin: 0.6 mg/dL (ref 0.3–1.2)
Total Protein: 6.5 g/dL (ref 6.5–8.1)

## 2022-12-15 LAB — CBC WITH DIFFERENTIAL (CANCER CENTER ONLY)
Abs Immature Granulocytes: 0.08 10*3/uL — ABNORMAL HIGH (ref 0.00–0.07)
Basophils Absolute: 0 10*3/uL (ref 0.0–0.1)
Basophils Relative: 0 %
Eosinophils Absolute: 0 10*3/uL (ref 0.0–0.5)
Eosinophils Relative: 0 %
HCT: 29.9 % — ABNORMAL LOW (ref 36.0–46.0)
Hemoglobin: 9.6 g/dL — ABNORMAL LOW (ref 12.0–15.0)
Immature Granulocytes: 1 %
Lymphocytes Relative: 4 %
Lymphs Abs: 0.4 10*3/uL — ABNORMAL LOW (ref 0.7–4.0)
MCH: 31.2 pg (ref 26.0–34.0)
MCHC: 32.1 g/dL (ref 30.0–36.0)
MCV: 97.1 fL (ref 80.0–100.0)
Monocytes Absolute: 0.8 10*3/uL (ref 0.1–1.0)
Monocytes Relative: 10 %
Neutro Abs: 7.4 10*3/uL (ref 1.7–7.7)
Neutrophils Relative %: 85 %
Platelet Count: 289 10*3/uL (ref 150–400)
RBC: 3.08 MIL/uL — ABNORMAL LOW (ref 3.87–5.11)
RDW: 15 % (ref 11.5–15.5)
WBC Count: 8.7 10*3/uL (ref 4.0–10.5)
nRBC: 0 % (ref 0.0–0.2)

## 2022-12-15 LAB — SAMPLE TO BLOOD BANK

## 2022-12-15 MED ORDER — MELOXICAM 15 MG TABLET
15 | ORAL_TABLET | Freq: Every day | ORAL | 2 refills | 30.00000 days | Status: AC
Start: 2022-12-15 — End: 2023-03-28

## 2022-12-15 MED ORDER — SODIUM CHLORIDE 0.9% FLUSH
10.0000 mL | INTRAVENOUS | Status: DC | PRN
  Administered 2022-12-15: 10 mL via INTRAVENOUS

## 2022-12-15 MED ORDER — HEPARIN SOD (PORK) LOCK FLUSH 100 UNIT/ML IV SOLN
500.0000 [IU] | Freq: Once | INTRAVENOUS | Status: AC
  Administered 2022-12-15: 500 [IU] via INTRAVENOUS

## 2022-12-15 MED ORDER — SODIUM CHLORIDE 0.9 % IV SOLN: INTRAVENOUS | Status: DC

## 2022-12-15 MED ORDER — SODIUM CHLORIDE 0.9% FLUSH
10.0000 mL | Freq: Once | INTRAVENOUS | Status: AC
  Administered 2022-12-15: 10 mL via INTRAVENOUS

## 2022-12-15 MED ORDER — SODIUM CHLORIDE 0.9 % IV SOLN: INTRAVENOUS | Status: AC

## 2022-12-15 NOTE — Progress Notes (Signed)
[facility] OFFICE PROGRESS NOTE   Diagnosis: Anal cancer  INTERVAL HISTORY:   Ms. Barcena returns prior to scheduled follow-up.  She completed cycle 1 nivolumab 12/12/2022.  Her daughter contacted the office yesterday to report Ms. Deol was weak, experiencing dyspnea on exertion, continuing to have rectal bleeding, some confusion.  She continues to have rectal bleeding.  She notes stool in addition to the blood.  No rash.  No diarrhea.  No nausea or vomiting.  She continues to have rectal pain.  She estimates taking 1-1/2 Percocet tablets every 6 hours.  She is also taking tramadol, schedule unclear.  She takes Xanax 1.5 mg at bedtime.  She feels oral intake is good.  Mouth feels dry.  She fell this morning, tripping over the bathroom rug.  No fever.  No dysuria.  She had a UTI about a month ago.  Objective:  Vital signs in last 24 hours:  Blood pressure 96/61, pulse 100, temperature 98.1 F (36.7 C), temperature source Temporal, resp. rate 18, height 5\' 7"  (1.702 m), weight 133 lb (60.3 kg), SpO2 99%.    HEENT: Tongue is dry appearing.  No thrush. Resp: Lungs clear bilaterally. Cardio: Regular rate and rhythm. GI: No hepatosplenomegaly. Vascular: Trace bilateral ankle edema. Neuro: Alert, oriented, follows commands.  Motor strength is intact. Skin: Decreased skin turgor. Port-A-Cath without erythema.  Lab Results:  Lab Results  Component Value Date   WBC 8.7 12/15/2022   HGB 9.6 (L) 12/15/2022   HCT 29.9 (L) 12/15/2022   MCV 97.1 12/15/2022   PLT 289 12/15/2022   NEUTROABS 7.4 12/15/2022    Imaging:  No results found.  Medications: I have reviewed the patient's current medications.  Assessment/Plan: Anal cancer Anal mass noted on digital exam and colonoscopy 05/09/2022-biopsy consistent with squamous cell carcinoma CTs 05/12/2022-no evidence of thoracic metastatic disease, calcified right pleural plaques, asymmetric thickening of the right posterior  wall of the distal rectum/anal canal, no evidence of metastatic adenopathy in the abdomen or pelvis, 2 small hypodense left liver lesions favored to represent benign cysts PET scan 05/30/2022-intense hypermetabolic activity at the anus.  No evidence of metastatic adenopathy.  Indeterminate small, mildly hypermetabolic lesion in the lateral segment of the left hepatic lobe. Radiation 06/06/2022-07/19/2022 Cycle one 5-FU/Mitomycin-C 06/06/2022 cycle two 5-FU/Mitomycin-C 07/04/2022 (5-FU dose reduced secondary to diarrhea) MRI liver 06/15/2022-1 cm hypervascular mass in segment 2 of the left hepatic lobe, corresponds to the focus of hypermetabolism on recent PET-CT.  Imaging characteristics atypical for metastatic squamous cell carcinoma.  Follow-up MRI in 3 months Cycle two 5-FU/Mitomycin-C 07/04/2022 (5-FU dose reduced secondary to diarrhea) 08/29/2022 anoscopy, Dr. Maisie Fus, smaller posterior midline rectal mass approximately in the dentate line MRI liver 09/12/2022-enlargement of a complex cystic and solid-appearing enhancing lesion in segment 2 of the liver, 4 x 2.2 cm compared to 1.5 cm on previous study PET scan 09/23/2022-interval increase in size and tracer avidity of a solitary metastasis within the lateral segment of left hepatic lobe.  Tracer avid lesion involving the anus again noted, slightly decreased in size and tracer avidity. 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 Modesta Messing 10/10/2022, presented at multidisciplinary clinic-recommendation for 3 months of treatment with systemic therapy to address the liver followed by restaging scan and also MRI of the pelvis to assess the primary site  Cycle 1 Taxol/carboplatin 11/03/2022 12/07/2022 chemotherapy discontinued due to poor performance status Cycle 1 nivolumab 12/12/2022 Depression Left breast cancer 1995 Recurrent left breast cancer 2003?  Status  post a left mastectomy and TRAM Family history of multiple cancers she reports  being negative for a BRCA mutation Fall 11/19/2022, seen in the emergency department 11/22/2022-x-ray right foot/ankle with minimally displaced oblique fracture of the posterior distal fibula, moderate joint effusion and soft tissue swelling about the ankle. Syncopal episode 11/19/2022-question if due to dehydration  Disposition: Ms. Rosebrough appears stable.  She completed cycle 1 nivolumab 12/12/2022.  She continues to have a poor performance status.  Part of the weakness and "confusion" her daughter has noticed may be related to her medications, though she is oriented in the office today.  She appears dehydrated which is likely contributing as well.  We did a complete review of the medication list and eliminated those she should not be taking right now.  For pain she will continue Percocet as needed.  She understands she should not take tramadol if she is taking Percocet.  She reports Xanax and gabapentin are being weaned down by her PCP.  She will receive IV fluids today.  We are checking a urinalysis.  She and her daughter both understand it is too soon to know if nivolumab will be effective for the cancer.  Ms. Suen and her daughter understand our recommendation for someone to be with her 24 hours a day and that someone other than Ms. Millet should be monitoring her medications.  We discussed placement which Ms. Warfel declines.  We are requesting physical therapy home evaluation for safety.  We reviewed the CBC and chemistry panel from today.  She is scheduled to return for follow-up 12/23/2022.  We are available to see her sooner if needed.  Patient seen with Dr. Truett Perna.    Lonna Cobb ANP/GNP-BC   12/15/2022  8:55 AM  This was a shared visit with Lonna Cobb.  Ms. Eustice was interviewed and examined.  She has persistent failure to thrive.  I doubt her symptoms are related to the 1 cycle of Taxol/carboplatin or nivolumab.  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  We adjusted her medical regimen today.  We recommend she have 24-hour supervision in the home.  I was present for greater than 50% of today's visit.  I performed medical decision making.  Mancel Bale, MD

## 2022-12-15 NOTE — Telephone Encounter (Signed)
Pts daughter would like a call back concerning pts medication that might be interacting with meds that Dr Doneen Poisson put her on. Please advise.

## 2022-12-15 NOTE — Telephone Encounter (Signed)
I reached out to [facility] and spoke with Clydie Braun regarding the process of contacting the patient's daughter to arrange therapy. The daughter's contact number was provided. I inquired whether the physical therapy team could conduct a home evaluation for safety referral purposes.

## 2022-12-15 NOTE — Patient Instructions (Signed)

## 2022-12-16 ENCOUNTER — Other Ambulatory Visit (HOSPITAL_BASED_OUTPATIENT_CLINIC_OR_DEPARTMENT_OTHER): Payer: Self-pay

## 2022-12-16 ENCOUNTER — Other Ambulatory Visit: Payer: Self-pay | Admitting: Nurse Practitioner

## 2022-12-16 ENCOUNTER — Telehealth: Payer: Self-pay

## 2022-12-16 ENCOUNTER — Inpatient Hospital Stay: Admit: 2022-12-16 | Discharge: 2022-12-16 | Payer: BLUE CROSS/BLUE SHIELD | Primary: Internal Medicine

## 2022-12-16 ENCOUNTER — Ambulatory Visit: Admit: 2022-12-16 | Payer: BLUE CROSS/BLUE SHIELD | Attending: Adult Health | Primary: Internal Medicine

## 2022-12-16 ENCOUNTER — Encounter: Admit: 2022-12-16 | Payer: PRIVATE HEALTH INSURANCE | Attending: Adult Health | Primary: Internal Medicine

## 2022-12-16 ENCOUNTER — Ambulatory Visit: Admit: 2022-12-16 | Payer: PRIVATE HEALTH INSURANCE | Primary: Internal Medicine

## 2022-12-16 DIAGNOSIS — C3411 Malignant neoplasm of upper lobe, right bronchus or lung: Secondary | ICD-10-CM

## 2022-12-16 DIAGNOSIS — R52 Pain, unspecified: Secondary | ICD-10-CM

## 2022-12-16 DIAGNOSIS — R638 Other symptoms and signs concerning food and fluid intake: Secondary | ICD-10-CM

## 2022-12-16 DIAGNOSIS — F32A Depression: Secondary | ICD-10-CM

## 2022-12-16 DIAGNOSIS — H9190 Unspecified hearing loss, unspecified ear: Secondary | ICD-10-CM

## 2022-12-16 DIAGNOSIS — R300 Dysuria: Secondary | ICD-10-CM

## 2022-12-16 DIAGNOSIS — Z923 Personal history of irradiation: Secondary | ICD-10-CM

## 2022-12-16 DIAGNOSIS — Z880 Allergy status to penicillin: Secondary | ICD-10-CM

## 2022-12-16 DIAGNOSIS — Z5112 Encounter for antineoplastic immunotherapy: Secondary | ICD-10-CM

## 2022-12-16 DIAGNOSIS — K5900 Constipation, unspecified: Secondary | ICD-10-CM

## 2022-12-16 DIAGNOSIS — R413 Other amnesia: Secondary | ICD-10-CM

## 2022-12-16 DIAGNOSIS — C3490 Malignant neoplasm of unspecified part of unspecified bronchus or lung: Secondary | ICD-10-CM

## 2022-12-16 DIAGNOSIS — B1920 Unspecified viral hepatitis C without hepatic coma: Secondary | ICD-10-CM

## 2022-12-16 DIAGNOSIS — Z720 Tobacco use: Secondary | ICD-10-CM

## 2022-12-16 DIAGNOSIS — J449 Chronic obstructive pulmonary disease, unspecified: Secondary | ICD-10-CM

## 2022-12-16 DIAGNOSIS — F419 Anxiety disorder, unspecified: Secondary | ICD-10-CM

## 2022-12-16 DIAGNOSIS — C210 Malignant neoplasm of anus, unspecified: Secondary | ICD-10-CM

## 2022-12-16 LAB — COMPREHENSIVE METABOLIC PANEL
BKR A/G RATIO: 1.1 (ref 1.0–2.2)
BKR ALBUMIN: 4.2 g/dL (ref 3.6–5.1)
BKR ALKALINE PHOSPHATASE: 79 U/L (ref 9–122)
BKR ANION GAP: 12 (ref 7–17)
BKR ANION GAP: 3.7 g/dL — ABNORMAL LOW (ref 2.0–3.9)
BKR ASPARTATE AMINOTRANSFERASE (AST): 29 U/L (ref 10–35)
BKR AST/ALT RATIO: 2.4
BKR BILIRUBIN TOTAL: 0.6 mg/dL (ref <=1.2)
BKR BLOOD UREA NITROGEN: 19 mg/dL (ref 8–23)
BKR CHLORIDE: 100 mmol/L — ABNORMAL HIGH (ref 98–107)
BKR CO2: 25 mmol/L (ref 20–30)
BKR CREATININE: 0.59 mg/dL (ref 0.40–1.30)
BKR EGFR, CREATININE (CKD-EPI 2021): >60 mL/min/{1.73_m2} (ref >=60)
BKR GLOBULIN: 3.7 g/dL (ref 2.0–3.9)
BKR GLUCOSE: 96 mg/dL (ref 70–100)
BKR IGA: 96 mg/dL — ABNORMAL LOW (ref 70–100)
BKR POTASSIUM: 4.1 mmol/L (ref 3.3–5.3)
BKR PROTEIN TOTAL: 7.9 g/dL (ref 5.9–8.3)
BKR SODIUM: 137 mmol/L (ref 136–144)
BKR WAM BASOPHILS: 79 U/L (ref 9–122)
BKR WAM HEMATOCRIT (2 DEC): 1.1 x 1000/ÂµL — ABNORMAL HIGH (ref 1.0–2.2)
BKR WAM LYMPHOCYTES: 7.9 g/dL — ABNORMAL LOW (ref 5.9–8.3)
BKR WAM MONOCYTES: 4.2 g/dL (ref 3.6–5.1)
BKR WAM PLATELETS: 0.59 mg/dL (ref 0.40–1.30)
BKR WAM RED BLOOD CELL COUNT.: 137 mmol/L (ref 136–144)

## 2022-12-16 LAB — CBC WITH AUTO DIFFERENTIAL
BKR ALANINE AMINOTRANSFERASE (ALT): 0.4 % (ref 0.0–1.0)
BKR BLOOD UREA NITROGEN: 0.1 x 1000/ÂµL (ref 0.00–1.00)
BKR BUN / CREAT RATIO: 0 x 1000/ÂµL — ABNORMAL HIGH (ref 0.00–1.00)
BKR BUN / CREAT RATIO: 86.4 % — ABNORMAL HIGH (ref 39.0–72.0)
BKR CALCIUM: 8.6 fL (ref 8.0–12.0)
BKR IGG: 32.5 pg (ref 27.0–33.0)
BKR PROTEIN TOTAL: 8.43 x 1000/ÂµL — ABNORMAL HIGH (ref 2.00–7.60)
BKR SODIUM: 1 % (ref 0.0–5.0)
BKR WAM ABSOLUTE IMMATURE GRANULOCYTES.: 0.04 x 1000/ÂµL (ref 0.00–0.30)
BKR WAM ABSOLUTE LYMPHOCYTE COUNT.: 0.46 x 1000/ÂµL — ABNORMAL LOW (ref 0.60–3.70)
BKR WAM ABSOLUTE NRBC (2 DEC): 0 x 1000/ÂµL (ref 0.00–1.00)
BKR WAM ANC (ABSOLUTE NEUTROPHIL COUNT): 8.43 x 1000/ÂµL — ABNORMAL HIGH (ref 2.00–7.60)
BKR WAM BASOPHIL ABSOLUTE COUNT.: 0.05 x 1000/ÂµL (ref 0.00–1.00)
BKR WAM EOSINOPHIL ABSOLUTE COUNT.: 0.1 x 1000/ÂµL (ref 0.00–1.00)
BKR WAM EOSINOPHILS: 1 % (ref 0.0–5.0)
BKR WAM HEMATOCRIT (2 DEC): 47.1 % — ABNORMAL HIGH (ref 35.00–45.00)
BKR WAM HEMOGLOBIN: 15.4 g/dL (ref 11.7–15.5)
BKR WAM IMMATURE GRANULOCYTES: 0.4 % (ref 0.0–1.0)
BKR WAM MCH (PG): 32.5 pg (ref 27.0–33.0)
BKR WAM MCHC: 0.04 x 1000/ÂµL (ref 0.00–0.30)
BKR WAM MCHC: 32.7 g/dL (ref 31.0–36.0)
BKR WAM MCV: 99.4 fL (ref 80.0–100.0)
BKR WAM MONOCYTE ABSOLUTE COUNT.: 0.68 x 1000/ÂµL (ref 0.00–1.00)
BKR WAM MPV: 8.6 fL (ref 8.0–12.0)
BKR WAM NEUTROPHILS: 86.4 % — ABNORMAL HIGH (ref 39.0–72.0)
BKR WAM NUCLEATED RED BLOOD CELLS: 0 % (ref 0.0–1.0)
BKR WAM RDW-CV: 13.2 % (ref 11.0–15.0)
BKR WAM RED BLOOD CELL COUNT.: 0 % — ABNORMAL LOW (ref 0.0–1.0)
BKR WAM WHITE BLOOD CELL COUNT: 9.8 x1000/ÂµL (ref 4.0–11.0)

## 2022-12-16 LAB — TSH W/REFLEX TO FT4     (BH GH LMW Q YH): BKR THYROID STIMULATING HORMONE: 0.745 ÂµIU/mL

## 2022-12-16 LAB — URINALYSIS, COMPLETE (UACMP) WITH MICROSCOPIC
Bilirubin Urine: NEGATIVE
Glucose, UA: NEGATIVE mg/dL
Nitrite: NEGATIVE
Protein, ur: 100 mg/dL — AB
Specific Gravity, Urine: 1.028 (ref 1.005–1.030)
pH: 6.5 (ref 5.0–8.0)

## 2022-12-16 MED ORDER — DIPHENHYDRAMINE 50 MG/ML INJECTION (WRAPPED E-RX)
50 | Freq: Once | INTRAVENOUS | Status: DC | PRN
Start: 2022-12-16 — End: 2022-12-17

## 2022-12-16 MED ORDER — EPINEPHRINE 0.3 MG/0.3 ML INJECTION, AUTO-INJECTOR
0.3 | INTRAMUSCULAR | Status: DC | PRN
Start: 2022-12-16 — End: 2022-12-17

## 2022-12-16 MED ORDER — ALBUTEROL SULFATE 2.5 MG/3 ML (0.083 %) SOLUTION FOR NEBULIZATION
2.5 | RESPIRATORY_TRACT | Status: DC | PRN
Start: 2022-12-16 — End: 2022-12-17

## 2022-12-16 MED ORDER — HYDROCORTISONE SOD SUCCINATE (PF) 100 MG/2 ML SOLUTION FOR INJECTION
100 | Freq: Once | INTRAVENOUS | Status: DC | PRN
Start: 2022-12-16 — End: 2022-12-17

## 2022-12-16 MED ORDER — FAMOTIDINE 4 MG/ML IN 0.9% SODIUM CHLORIDE (ADULT)
Freq: Once | INTRAVENOUS | Status: DC | PRN
Start: 2022-12-16 — End: 2022-12-17

## 2022-12-16 MED ORDER — DURVALUMAB INFUSION
Freq: Once | INTRAVENOUS | Status: CP
Start: 2022-12-16 — End: ?
  Administered 2022-12-16: 21:00:00 250.000 mL/h via INTRAVENOUS

## 2022-12-16 MED ORDER — SODIUM CHLORIDE 0.9 % BOLUS (NEW BAG)
0.9 | Freq: Once | INTRAVENOUS | Status: DC | PRN
Start: 2022-12-16 — End: 2022-12-17

## 2022-12-16 MED ORDER — NITROFURANTOIN MONOHYD MACRO 100 MG PO CAPS
100.0000 mg | ORAL_CAPSULE | Freq: Two times a day (BID) | ORAL | 0 refills | Status: AC
Start: 2022-12-16 — End: 2022-12-21
  Filled 2022-12-16: qty 10, 5d supply, fill #0

## 2022-12-16 NOTE — Telephone Encounter (Signed)
-----   Message from Lonna Cobb sent at 12/16/2022 12:41 PM EDT ----- Please let her daughter know there are increased white cells and red cells in her urine.  We can go ahead and start her on an antibiotic and contact them next week with the culture report.  I sent a prescription for Macrobid to the [facility].  Thanks

## 2022-12-16 NOTE — Telephone Encounter (Signed)
Patient's daughter gave verbal understanding and had no further questions or concerns

## 2022-12-16 NOTE — Telephone Encounter (Signed)
Spoke to Alyssa Deleon, asked her how I can help. Antony Contras said that pt was started on Oxycodone 9/11 since is having some confusion wants to know if it is interacting with some of her other meds. Told her Alyssa said 2 options, go to the Ed to be evaluated for UTI or other issues or call Dr. Kalman Drape office and let them know she is having confusion since starting med. Antony Contras said they already did that was seen in the ED yesterday and they checked her urine and also saw Dr. Truett Perna yesterday and went over all her medications and said to contact PCP to see if any medications are interacting with pain medication Oxycodone. Told her I will send message to Alyssa and get back to her. Antony Contras verbalized understanding.

## 2022-12-16 NOTE — Telephone Encounter (Signed)
Please see message and advise

## 2022-12-16 NOTE — Telephone Encounter (Signed)
Spoke to [place] told her per Arlyss Repress, We would need to complete an office visit in which she has all of her medication present with her and we can sort through this together. Please make sure she is taking her current medication only as directed. She needs to keep her follow-up with her oncologist. She will need to present to the ER this weekend if any worsening / acute change in symptoms. Antony Contras verbalized understanding and said pt does have a UTI and was put on medication. Antony Contras also said trying to get pt in Rehab and paperwork needs to be filled out. Told her pt would need a face to face in order to complete. Antony Contras said okay she will get Dr. Truett Perna to complete since she sees him all the time. Told her okay. If confusion does not get better please go to the ED. Antony Contras verbalized understanding.

## 2022-12-16 NOTE — Progress Notes
 Pt arrived for C1D1 Durvalumab.  PIV placed and labs drawn and sent for processing.Seen by Irving Burton, APRN and cleared for treatment.  U/A ordered for complaints of hematuria.Assessment completed and charted.  Pt very reluctant to ask/answer questions regarding her immunotherapy.  Handout given to husband.PIV flushed with brisk blood return noted and IVF started to try to obtain urine sample per [facility].Durvalumab started at 1647hrs and handoff given to [facility], Charity fundraiser.

## 2022-12-16 NOTE — Progress Notes
 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 female active smoker (~50 pack-years) with a history of COPD and a now biopsy-proven NSCLC (keratinizing SCC) of the RUL with invasion of the posterior chest wall and ribs 5-6 (PD-L1 TPS 5%, at least cT3N0). She additionally has lesion in the LEFT lung currently under surveillance. 06/25/2022 [place] A/P showed findings concerning for colitis predominant involvement of the right colon and cecum. 07/01/2022 [place] chest showed RIGHT upper lobe mass with chest wall invasion. Mass measures 5.1 x 3.3 x 4.9 cm. This mass erodes into the posterior chest wall with adjacent erosion of the RIGHT posterior fifth and sixth ribs. 07/06/2022 FEV1 37% predicted, FEV1/FVC 35%, DLCO 20%. Severe obstruction, severely impaired diffusing capacity. 07/09/2022 MRI brain with no evidence of metastatic disease to the brain. 07/16/2022 FDG PET/[place] showed intensely hypermetabolic RIGHT upper lobe pleural based lung mass with chest wall and right 5-6th ribs invasion. Biapical hypermetabolic scarring with more prominent on the LEFT apical nodularity, nonspecific but concerning for malignant involvement.She met with Dr Maxcine Ham (med-onc) and Dr Valora Corporal (Rad onc) - decision reached to proceed with concurrent chemo/RTPRIOR THERAPY: C1D1 carbo/taxol weekly w RT on 6/11/24Completed RT 8/2/24CURRENT THERAPY: C1D1 Durvalumab given 9/20/24INTERIM HISTORY: Courtney Knox presents to initiate therapy w durva today. She is accompanied by her husband Saw PCP yesterday who encouraged her to start sertraline for mood stabilization and meloxicam for pain. She has not started these. Reports blood in her urine since last night, denies dysuria but does have ongoing abdominal pain (this is not new). Reports she has pain all over and points to her abdomen, hips, knees and finger joints. Constipation persists, sometimes has soft stools when laxatives take effect. She reports cough productive of foul smelling sputum since RT, no worsening shortness of breath. She denies headaches, dizziness, fevers, chills, rash and neuropathy. REVIEW OF PAST MEDICAL,SURGICAL,SOCIAL,FAMILY HISTORY: reviewed and unchangedShe  has a past medical history of Anxiety, COPD (chronic obstructive pulmonary disease) (HC Code), Depression, HCV (hepatitis C virus), Lung cancer (HC Code), Memory loss, and Tobacco abuse.She has had a Left wrist surgery. 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 Continues to smoke with roughly 40 pack year history. FAMILY HISTORY:  Father, lung cancer; brother, lymphomaReview of SystemsPertinent items are noted in HPI.A 12- point review of systems, was otherwise negative except as stated in the HPI. ECOG 1ALLERGIES: PenicillinMEDICATIONS: Patient has a current medication list which includes the following prescription(s): b complex vitamins (B COMPLEX) tablet - Take 1 tablet by mouth daily (Patient not taking: Reported on 10/28/2022), calcium carbonate/vitamin D3 (CALCIUM WITH VITAMIN D3 ORAL) - Take 1 tablet by mouth daily (Patient not taking: Reported on 10/28/2022), EPCLUSA 400-100 mg per tablet - Take 1 tablet by mouth daily (Patient not taking: Reported on 11/16/2022), famotidine (PEPCID) 20 mg tablet - Take 1 tablet (20 mg total) by mouth daily (Patient not taking: Reported on 11/16/2022), hydrOXYzine (ATARAX) 25 mg tablet - Take 1 tablet (25 mg total) by mouth 3 (three) times daily as needed for itching (Patient not taking: Reported on 11/16/2022), meloxicam (MOBIC) 15 mg tablet - Take 1 tablet (15 mg total) by mouth daily, sertraline (ZOLOFT) 25 mg tablet - Take 1 tablet (25 mg total) by mouth daily, and traMADoL (ULTRAM) 50 mg tablet - Take 1 tablet (50 mg total) by mouth (  Patient not taking: Reported on 11/16/2022).PHYSICAL EXAM:BP 104/64 (Site: l a, Position: Sitting)  - Pulse 76  - Temp 97.4 ?F (36.3 ?C) (Temporal)  - Resp 18  - Wt 41.7 kg  - SpO2 95%  - BMI 17.10 kg/m?  Physical ExamConstitutional:     Appearance: Normal appearance. HENT:    Head: Normocephalic and atraumatic. Eyes:    Conjunctiva/sclera: Conjunctivae normal. Cardiovascular:    Rate and Rhythm: Normal rate and regular rhythm. Pulmonary:    Effort: Pulmonary effort is normal.    Breath sounds: No wheezing, rhonchi or rales.    Comments: Breath sounds decreased to lung basesAbdominal:    General: Bowel sounds are normal. There is no distension.    Palpations: Abdomen is soft.    Tenderness: There is no abdominal tenderness. Musculoskeletal:       General: Normal range of motion.    Cervical back: Normal range of motion.    Right lower leg: No edema.    Left lower leg: No edema.    Comments: TTP to right upper back Lymphadenopathy:    Cervical: No cervical adenopathy. Skin:   General: Skin is warm and dry.    Findings: Rash (small red bumps to right upper chest and upper back) present. No erythema. Neurological:    General: No focal deficit present.    Mental Status: She is alert and oriented to person, place, and time. Psychiatric:       Mood and Affect: Mood normal.       Behavior: Behavior normal.       Thought Content: Thought content normal.       Judgment: Judgment normal. Data Review:Lab Results Component Value Date  WBC 9.8 12/16/2022  RBC 4.74 12/16/2022  HGB 15.4 12/16/2022  HCT 47.10 (H) 12/16/2022  MCV 99.4 12/16/2022  MCH 32.5 12/16/2022  MCHC 32.7 12/16/2022  PLT 256 12/16/2022  MPV 8.6 12/16/2022 Lab Results Component Value Date  GLU 96 12/16/2022  BUN 19 12/16/2022  CREATININE 0.59 12/16/2022  NA 137 12/16/2022  K 4.1 12/16/2022  CL 100 12/16/2022  CO2 25 12/16/2022  ALBUMIN 4.2 12/16/2022  PROT 7.9 12/16/2022  BILITOT 0.6 12/16/2022  ALKPHOS 79 12/16/2022  ALT 12 12/16/2022  GLOB 3.7 12/16/2022  CALCIUM 9.4 12/16/2022  IMPRESSION:  Malignant neoplasm of bronchus and lung (HC Code)  (primary encounter diagnosis)Ms Grout is a 49 yer old woman diagnosed in the spring of 2024 with 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 She did ultimately go to the ED but left before any testing was performed. 10/28/22 - completed concurrent therapy. 12/16/22 - Today she presents with her husband to initiate durvalumab. Dr Maxcine Ham met with her last week and reviewed rationale for use, consented her. She remains reluctant to start additional therapy, concerned about potential side effects and feeling unwell at baseline. We again reviewed why durva is recommended, went over side effects. She ultimately agreed to proceed with treatment today. She is reporting blood in the urine since last night. Difficult to obtain history information, she quickly becomes upset and doesn't want to elaborate on symptoms she is experiencing. Recent [place] scan without abnormalities in the abdomen/pelvis. Will obtain UA/culture today. Labs, vitals, ROS reviewed, physical exam completed. Patient meets criteria to proceed with treatment today. PLAN: NSCLC - Proceed with  C1D1 durva today- Will hydrate w saline today due to low BP, order entered.Blood in urine - send UA/culture	- may need urology or gyn referral to further evaluateConstipation - continue miralax daily as needed. Pain - to start meloxicamPO intake decreased - offered nutrition assistance but she has previously declined. 	- will focus on frequent small meals, maximizing calorie intake. Decreased hearing - will consider referral to ENT/audiology in the future. Patient to call with new or worsening symptoms prior to the next visit. RTC in 2 weeks for next infusion. Scans due in approx 3 months from prior imaging. On the day of this patient's encounter, a total of 53 minutes was personally spent by me.  This does not include any resident/fellow teaching time, or any time spent performing a procedural service.

## 2022-12-16 NOTE — Progress Notes
 Assumed care of patient. Hydration and durvalumab infusions completed per [facility] without issue. (+)BR post infusions. PIV removed. UA/Ucx collected and sent. Dc'd from clinic in stable condition in care of support.

## 2022-12-17 ENCOUNTER — Encounter (HOSPITAL_COMMUNITY): Payer: Self-pay

## 2022-12-17 LAB — URINALYSIS-MACROSCOPIC W/REFLEX MICROSCOPIC
BKR BACTERIA, UA (MANUAL): NEGATIVE /HPF — AB
BKR BILIRUBIN, UA: NEGATIVE
BKR GLUCOSE, UA: NEGATIVE
BKR MANUAL MICROSCOPIC: NEGATIVE
BKR NITRITE, UA: POSITIVE — AB
BKR PH, UA: 5.5 (ref 5.5–7.5)
BKR SPECIFIC GRAVITY, UA: 1.015 (ref 1.005–1.030)
BKR UROBILINOGEN, UA (MG/DL): <2 mg/dL (ref <=2.0)

## 2022-12-17 LAB — URINE MICROSCOPIC     (BH GH LMW YH)
BKR KETONES, UA: >30 /HPF — AB (ref 0–2)
BKR RBC/HPF: >30 /HPF — AB (ref 0–2)
BKR WBC/HPF INSTRUMENT: <2 mg/dL (ref <=2.0)

## 2022-12-17 LAB — URINE CULTURE: Culture: NO GROWTH

## 2022-12-19 ENCOUNTER — Telehealth: Payer: Self-pay | Admitting: Physician Assistant

## 2022-12-19 ENCOUNTER — Telehealth: Admit: 2022-12-19 | Payer: PRIVATE HEALTH INSURANCE | Attending: Adult Health | Primary: Internal Medicine

## 2022-12-19 DIAGNOSIS — N3001 Acute cystitis with hematuria: Secondary | ICD-10-CM

## 2022-12-19 LAB — URINE CULTURE: BKR URINE CULTURE, ROUTINE: >=100000 — AB

## 2022-12-19 MED ORDER — NITROFURANTOIN MONOHYDRATE/MACROCRYSTALS 100 MG CAPSULE
100 | ORAL_CAPSULE | Freq: Two times a day (BID) | ORAL | 1 refills | Status: AC
Start: 2022-12-19 — End: ?

## 2022-12-19 NOTE — Telephone Encounter (Signed)
Home Health Verbal Orders  Agency:  [facility]  Caller:  Contractor and title  Requesting OT/ PT/ Skilled nursing/ Social Work/ Speech:    Reason for Request:  Referred to PT from Oncologist - Evaluation today - HR - 105 BMP, BP 90/60 - 80/60  Frequency:    HH needs F2F w/in last 30 days     [id number]

## 2022-12-19 NOTE — Telephone Encounter
 Urine culture positive for infection. Called patient and her husband to let them know - no answer. Called patients daughter, she will get in touch with her mom and let her know to start the abx. Patient also wears depends, will advise her to change more frequently. Prescription for [facility]bid [facility]. Patient to call back with further questions or concerns.

## 2022-12-20 ENCOUNTER — Telehealth: Payer: Self-pay | Admitting: *Deleted

## 2022-12-20 ENCOUNTER — Telehealth: Admit: 2022-12-20 | Payer: PRIVATE HEALTH INSURANCE | Primary: Internal Medicine

## 2022-12-20 NOTE — Telephone Encounter (Signed)
Physical therapy asking for verbal order for PT 2/week x 3 weeks. Also wanted MD aware she has H/O right ankle fracture with no ortho f/u. Is is OK to proceed in regards to this?  BP has been low as well: Sitting 90/60 Standing 80/60 Lying 100/70 Pulse also runs 96-105 at rest. She is trying to work on her hydration.

## 2022-12-20 NOTE — Telephone Encounter (Signed)
Returned call and no Beth working in this office, spoke with [employer] who states called office on 12/19/22 to advise PCP of pt low blood pressure. No F2F form needed as Oncology is handling this issue per [employer]. Nothing further needed

## 2022-12-20 NOTE — Telephone Encounter (Signed)
Please see call note from Home Health regarding patient and advise

## 2022-12-20 NOTE — Telephone Encounter (Signed)
Noted  

## 2022-12-20 NOTE — Progress Notes
 INITIAL STUDIES:06/25/2022 [place] A/P showed findings concerning for colitis predominant involvement of the right colon and cecum.07/01/2022 [place] chest showed RIGHT upper lobe mass with chest wall invasion. Mass measures 5.1 x 3.3 x 4.9 cm. This mass erodes into the posterior chest wall with adjacent erosion of the RIGHT posterior fifth and sixth ribs.07/06/2022 FEV1 37% predicted, FEV1/FVC 35%, DLCO 20%. Severe obstruction, severely impaired diffusing capacity.07/09/2022 MRI brain with no evidence of metastatic disease to the brain.07/16/2022 FDG PET/[place] showed intensely hypermetabolic RIGHT upper lobe pleural based lung mass with chest wall and right 5-6th ribs invasion. Biapical hypermetabolic scarring with more prominent on the LEFT apical nodularity, nonspecific but concerning for malignant involvement.07/21/22 Path penAccession #: [id number]        1) LYMPH NODE, 11L, FINE NEEDLE ASPIRATION/WASH:           - NEGATIVE FOR MALIGNANT CELLS      - POLYMORPHOUS POPULATION OF LYMPHOCYTES. 2) LYMPH NODE, STATION 7, FINE NEEDLE ASPIRATION/WASH:           - NEGATIVE FOR MALIGNANT CELLS      - POLYMORPHOUS POPULATION OF LYMPHOCYTES. 3) LYMPH NODE, 11R, FINE NEEDLE ASPIRATION/WASH:           - NEGATIVE FOR MALIGNANT CELLS      - POLYMORPHOUS POPULATION OF LYMPHOCYTES. PAST MEDICAL/ SURGICAL HISTORY:  Colitis, COPD (chronic obstructive pulmonary disease), osteoporosis. Left wrist surgery. 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 Continues to smoke with roughly 40 pack year history.FAMILY HISTORY:  Father, lung cancer; brother, lymphomaMEDICATIONS:  Medication list reviewedALLERGIES:  PCNREVIEW OF SYSTEMS:  As described below and in chart.  Remaining review of systems is negative.Ms. Hiegel appears well. Maintains an ECOG performance status of one to two. Continues to struggle with psychiatric issues with multiple ER visits. Poor historian with multiple somatic complaints. Returns with husband today to discuss adjuvant therapies and review recent [place] imaging.[place] demonstrating response to chemo radiation discussed. Adjuvant options, including full dose chemotherapy and/ or immunotherapy again reviewed. 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 Hepatitis CImaging with response to chemoRTConsolidation durvalumab next week

## 2022-12-20 NOTE — Telephone Encounter
 12/20/2022   7:49 AM Post Treatment Call Post Treatment call made? Yes Spoke with:  left message

## 2022-12-21 NOTE — Telephone Encounter (Signed)
LVM that MD aware of BP readings and OK to proceed w/PT in regards to H/O ankle fracture unless it causes pain.

## 2022-12-22 ENCOUNTER — Encounter: Payer: Self-pay | Admitting: Neurosurgery

## 2022-12-23 ENCOUNTER — Inpatient Hospital Stay: Payer: Medicare Other | Admitting: Nurse Practitioner

## 2022-12-23 ENCOUNTER — Inpatient Hospital Stay: Payer: Medicare Other

## 2022-12-23 ENCOUNTER — Other Ambulatory Visit: Payer: Self-pay

## 2022-12-23 ENCOUNTER — Encounter: Payer: Self-pay | Admitting: Nurse Practitioner

## 2022-12-23 VITALS — BP 80/61 | HR 115 | Temp 97.9°F | Resp 16 | Ht 67.0 in | Wt 129.8 lb

## 2022-12-23 DIAGNOSIS — C210 Malignant neoplasm of anus, unspecified: Secondary | ICD-10-CM

## 2022-12-23 DIAGNOSIS — Z95828 Presence of other vascular implants and grafts: Secondary | ICD-10-CM

## 2022-12-23 DIAGNOSIS — Z5112 Encounter for antineoplastic immunotherapy: Secondary | ICD-10-CM | POA: Diagnosis not present

## 2022-12-23 LAB — CBC WITH DIFFERENTIAL (CANCER CENTER ONLY)
Abs Immature Granulocytes: 0.07 10*3/uL (ref 0.00–0.07)
Basophils Absolute: 0 10*3/uL (ref 0.0–0.1)
Basophils Relative: 0 %
Eosinophils Absolute: 0 10*3/uL (ref 0.0–0.5)
Eosinophils Relative: 0 %
HCT: 28.5 % — ABNORMAL LOW (ref 36.0–46.0)
Hemoglobin: 9.3 g/dL — ABNORMAL LOW (ref 12.0–15.0)
Immature Granulocytes: 1 %
Lymphocytes Relative: 5 %
Lymphs Abs: 0.5 10*3/uL — ABNORMAL LOW (ref 0.7–4.0)
MCH: 31.6 pg (ref 26.0–34.0)
MCHC: 32.6 g/dL (ref 30.0–36.0)
MCV: 96.9 fL (ref 80.0–100.0)
Monocytes Absolute: 0.9 10*3/uL (ref 0.1–1.0)
Monocytes Relative: 9 %
Neutro Abs: 8.1 10*3/uL — ABNORMAL HIGH (ref 1.7–7.7)
Neutrophils Relative %: 85 %
Platelet Count: 318 10*3/uL (ref 150–400)
RBC: 2.94 MIL/uL — ABNORMAL LOW (ref 3.87–5.11)
RDW: 14.9 % (ref 11.5–15.5)
WBC Count: 9.6 10*3/uL (ref 4.0–10.5)
nRBC: 0 % (ref 0.0–0.2)

## 2022-12-23 LAB — CMP (CANCER CENTER ONLY)
ALT: 6 U/L (ref 0–44)
AST: 12 U/L — ABNORMAL LOW (ref 15–41)
Albumin: 3.3 g/dL — ABNORMAL LOW (ref 3.5–5.0)
Alkaline Phosphatase: 79 U/L (ref 38–126)
Anion gap: 9 (ref 5–15)
BUN: 15 mg/dL (ref 8–23)
CO2: 27 mmol/L (ref 22–32)
Calcium: 8.4 mg/dL — ABNORMAL LOW (ref 8.9–10.3)
Chloride: 99 mmol/L (ref 98–111)
Creatinine: 0.58 mg/dL (ref 0.44–1.00)
GFR, Estimated: >60 mL/min (ref >60)
Glucose, Bld: 113 mg/dL — ABNORMAL HIGH (ref 70–99)
Potassium: 4 mmol/L (ref 3.5–5.1)
Sodium: 135 mmol/L (ref 135–145)
Total Bilirubin: 0.6 mg/dL (ref 0.3–1.2)
Total Protein: 6.2 g/dL — ABNORMAL LOW (ref 6.5–8.1)

## 2022-12-23 MED ORDER — SODIUM CHLORIDE 0.9% FLUSH
10.0000 mL | INTRAVENOUS | Status: DC | PRN
  Administered 2022-12-23: 10 mL via INTRAVENOUS

## 2022-12-23 MED ORDER — HEPARIN SOD (PORK) LOCK FLUSH 100 UNIT/ML IV SOLN
500.0000 [IU] | Freq: Once | INTRAVENOUS | Status: AC
  Administered 2022-12-23: 500 [IU] via INTRAVENOUS

## 2022-12-23 MED ORDER — SODIUM CHLORIDE 0.9 % IV SOLN: INTRAVENOUS | Status: DC

## 2022-12-23 NOTE — Progress Notes (Signed)
[facility] OFFICE PROGRESS NOTE   Diagnosis: Anal cancer  INTERVAL HISTORY:   Alyssa Deleon returns as scheduled.  She completed cycle 1 nivolumab 12/12/2022.  She received IV fluids 12/15/2022.  Due to confusion at the time of her last visit we checked a urinalysis and prescribed a course of Macrobid.  She is feeling much better.  She is no longer confused.  Appetite has improved.  She continues to have intermittent diarrhea.  She leaks stool.  She continues to note blood with the stool.  She is taking about 1 pain pill a day due to concern that we will not refill when she runs out.  Objective:  Vital signs in last 24 hours:  Blood pressure (!) 80/61, pulse (!) 115, temperature 97.9 F (36.6 C), temperature source Oral, resp. rate 16, height 5\' 7"  (1.702 m), weight 129 lb 12.8 oz (58.9 kg), SpO2 93%.    HEENT: No thrush or ulcers. Resp: Lungs clear bilaterally. Cardio: Regular rate and rhythm. GI: Abdomen soft and nontender.  No hepatomegaly. Vascular: No leg edema. Neuro: Alert and oriented. Skin: Mild decrease in skin turgor. Port-A-Cath without erythema.  Lab Results:  Lab Results  Component Value Date   WBC 9.6 12/23/2022   HGB 9.3 (L) 12/23/2022   HCT 28.5 (L) 12/23/2022   MCV 96.9 12/23/2022   PLT 318 12/23/2022   NEUTROABS 8.1 (H) 12/23/2022    Imaging:  No results found.  Medications: I have reviewed the patient's current medications.  Assessment/Plan: Anal cancer Anal mass noted on digital exam and colonoscopy 05/09/2022-biopsy consistent with squamous cell carcinoma CTs 05/12/2022-no evidence of thoracic metastatic disease, calcified right pleural plaques, asymmetric thickening of the right posterior wall of the distal rectum/anal canal, no evidence of metastatic adenopathy in the abdomen or pelvis, 2 small hypodense left liver lesions favored to represent benign cysts PET scan 05/30/2022-intense hypermetabolic activity at the anus.  No evidence of  metastatic adenopathy.  Indeterminate small, mildly hypermetabolic lesion in the lateral segment of the left hepatic lobe. Radiation 06/06/2022-07/19/2022 Cycle one 5-FU/Mitomycin-C 06/06/2022 cycle two 5-FU/Mitomycin-C 07/04/2022 (5-FU dose reduced secondary to diarrhea) MRI liver 06/15/2022-1 cm hypervascular mass in segment 2 of the left hepatic lobe, corresponds to the focus of hypermetabolism on recent PET-CT.  Imaging characteristics atypical for metastatic squamous cell carcinoma.  Follow-up MRI in 3 months Cycle two 5-FU/Mitomycin-C 07/04/2022 (5-FU dose reduced secondary to diarrhea) 08/29/2022 anoscopy, Dr. Maisie Fus, smaller posterior midline rectal mass approximately in the dentate line MRI liver 09/12/2022-enlargement of a complex cystic and solid-appearing enhancing lesion in segment 2 of the liver, 4 x 2.2 cm compared to 1.5 cm on previous study PET scan 09/23/2022-interval increase in size and tracer avidity of a solitary metastasis within the lateral segment of left hepatic lobe.  Tracer avid lesion involving the anus again noted, slightly decreased in size and tracer avidity. 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 Modesta Messing 10/10/2022, presented at multidisciplinary clinic-recommendation for 3 months of treatment with systemic therapy to address the liver followed by restaging scan and also MRI of the pelvis to assess the primary site  Cycle 1 Taxol/carboplatin 11/03/2022 12/07/2022 chemotherapy discontinued due to poor performance status Cycle 1 nivolumab 12/12/2022 Cycle 2 nivolumab 12/26/2022 Depression Left breast cancer 1995 Recurrent left breast cancer 2003?  Status post a left mastectomy and TRAM Family history of multiple cancers she reports being negative for a BRCA mutation Fall 11/19/2022, seen in the emergency department 11/22/2022-x-ray right foot/ankle with minimally  displaced oblique fracture of the posterior distal fibula, moderate joint effusion and  soft tissue swelling about the ankle. Syncopal episode 11/19/2022-question if due to dehydration  Disposition: [name] appears stable.  She has completed 1 cycle of nivolumab.  She is scheduled for cycle 2 on 12/26/2022.  Overall she appears improved as compared to her last office visit.  She is hypotensive and tachycardic in the office today.  She is likely dehydrated due to frequent loose stools.  She will receive IV fluids today.  We will repeat vital signs after the first 500 cc.  She will try to increase fluid intake at home.  Pain control is suboptimal due to her concern she will run out and we will not refill.  I assured her we would refill at appropriate intervals.  CBC and chemistry panel reviewed.  Labs adequate for treatment as scheduled 12/26/2022.  She will return for lab and follow-up 01/09/2023.  We are available to see her sooner if needed.    Lonna Cobb ANP/GNP-BC   12/23/2022  2:42 PM

## 2022-12-23 NOTE — Patient Instructions (Signed)

## 2022-12-24 ENCOUNTER — Other Ambulatory Visit: Payer: Self-pay | Admitting: Oncology

## 2022-12-26 ENCOUNTER — Inpatient Hospital Stay: Payer: Medicare Other

## 2022-12-26 VITALS — BP 93/62 | HR 108 | Temp 99.0°F | Resp 20 | Ht 67.0 in | Wt 129.5 lb

## 2022-12-26 DIAGNOSIS — Z5112 Encounter for antineoplastic immunotherapy: Secondary | ICD-10-CM | POA: Diagnosis not present

## 2022-12-26 DIAGNOSIS — C210 Malignant neoplasm of anus, unspecified: Secondary | ICD-10-CM

## 2022-12-26 MED ORDER — SODIUM CHLORIDE 0.9 % IV SOLN
240.0000 mg | Freq: Once | INTRAVENOUS | Status: AC
  Administered 2022-12-26: 240 mg via INTRAVENOUS
  Filled 2022-12-26: qty 24

## 2022-12-26 MED ORDER — SODIUM CHLORIDE 0.9 % IV SOLN: Freq: Once | INTRAVENOUS | Status: AC

## 2022-12-26 MED ORDER — SODIUM CHLORIDE 0.9% FLUSH
10.0000 mL | INTRAVENOUS | Status: DC | PRN
  Administered 2022-12-26: 10 mL

## 2022-12-26 MED ORDER — HEPARIN SOD (PORK) LOCK FLUSH 100 UNIT/ML IV SOLN
500.0000 [IU] | Freq: Once | INTRAVENOUS | Status: AC | PRN
  Administered 2022-12-26: 500 [IU]

## 2022-12-26 NOTE — Patient Instructions (Signed)
[facility]   Discharge Instructions: Thank you for choosing [facility] to provide your oncology and hematology care.   If you have a lab appointment with the [facility], please go directly to the [facility] and check in at the registration area.   Wear comfortable clothing and clothing appropriate for easy access to any Portacath or PICC line.   We strive to give you quality time with your provider. You may need to reschedule your appointment if you arrive late (15 or more minutes).  Arriving late affects you and other patients whose appointments are after yours.  Also, if you miss three or more appointments without notifying the office, you may be dismissed from the clinic at the provider's discretion.      For prescription refill requests, have your pharmacy contact our office and allow 72 hours for refills to be completed.    Today you received the following chemotherapy and/or immunotherapy agents Nivolumab (OPDIVO).      To help prevent nausea and vomiting after your treatment, we encourage you to take your nausea medication as directed.  BELOW ARE SYMPTOMS THAT SHOULD BE REPORTED IMMEDIATELY: *FEVER GREATER THAN 100.4 F (38 C) OR HIGHER *CHILLS OR SWEATING *NAUSEA AND VOMITING THAT IS NOT CONTROLLED WITH YOUR NAUSEA MEDICATION *UNUSUAL SHORTNESS OF BREATH *UNUSUAL BRUISING OR BLEEDING *URINARY PROBLEMS (pain or burning when urinating, or frequent urination) *BOWEL PROBLEMS (unusual diarrhea, constipation, pain near the anus) TENDERNESS IN MOUTH AND THROAT WITH OR WITHOUT PRESENCE OF ULCERS (sore throat, sores in mouth, or a toothache) UNUSUAL RASH, SWELLING OR PAIN  UNUSUAL VAGINAL DISCHARGE OR ITCHING   Items with * indicate a potential emergency and should be followed up as soon as possible or go to the Emergency Department if any problems should occur.  Please show the CHEMOTHERAPY ALERT CARD or IMMUNOTHERAPY ALERT CARD  at check-in to the Emergency Department and triage nurse.  Should you have questions after your visit or need to cancel or reschedule your appointment, please contact [facility]  Dept: [phone]  and follow the prompts.  Office hours are 8:00 a.m. to 4:30 p.m. Monday - Friday. Please note that voicemails left after 4:00 p.m. may not be returned until the following business day.  We are closed weekends and major holidays. You have access to a nurse at all times for urgent questions. Please call the main number to the clinic Dept: [phone] and follow the prompts.   For any non-urgent questions, you may also contact your provider using MyChart. We now offer e-Visits for anyone 28 and older to request care online for non-urgent symptoms. For details visit [email or web address].   Also download the MyChart app! Go to the app store, search "MyChart", open the app, select [address], and log in with your MyChart username and password.  Nivolumab Injection What is this medication? NIVOLUMAB (nye VOL ue mab) treats some types of cancer. It works by helping your immune system slow or stop the spread of cancer cells. It is a monoclonal antibody. This medicine may be used for other purposes; ask your health care provider or pharmacist if you have questions. COMMON BRAND NAME(S): Opdivo What should I tell my care team before I take this medication? They need to know if you have any of these conditions: Allogeneic stem cell transplant (uses someone else's stem cells) Autoimmune diseases, such as Crohn disease, ulcerative colitis, lupus History of chest radiation Nervous system  as Guillain-Barre syndrome or myasthenia gravis Organ transplant An unusual or allergic reaction to nivolumab, other medications, foods, dyes, or preservatives Pregnant or trying to get pregnant Breast-feeding How should I use this medication? This medication is infused  into a vein. It is given in a hospital or clinic setting. A special MedGuide will be given to you before each treatment. Be sure to read this information carefully each time. Talk to your care team about the use of this medication in children. While it may be prescribed for children as young as 12 years for selected conditions, precautions do apply. Overdosage: If you think you have taken too much of this medicine contact a poison control center or emergency room at once. NOTE: This medicine is only for you. Do not share this medicine with others. What if I miss a dose? Keep appointments for follow-up doses. It is important not to miss your dose. Call your care team if you are unable to keep an appointment. What may interact with this medication? Interactions have not been studied. This list may not describe all possible interactions. Give your health care provider a list of all the medicines, herbs, non-prescription drugs, or dietary supplements you use. Also tell them if you smoke, drink alcohol, or use illegal drugs. Some items may interact with your medicine. What should I watch for while using this medication? Your condition will be monitored carefully while you are receiving this medication. You may need blood work while taking this medication. This medication may cause serious skin reactions. They can happen weeks to months after starting the medication. Contact your care team right away if you notice fevers or flu-like symptoms with a rash. The rash may be red or purple and then turn into blisters or peeling of the skin. You may also notice a red rash with swelling of the face, lips, or lymph nodes in your neck or under your arms. Tell your care team right away if you have any change in your eyesight. Talk to your care team if you are pregnant or think you might be pregnant. A negative pregnancy test is required before starting this medication. A reliable form of contraception is recommended  while taking this medication and for 5 months after the last dose. Talk to your care team about effective forms of contraception. Do not breast-feed while taking this medication and for 5 months after the last dose. What side effects may I notice from receiving this medication? Side effects that you should report to your care team as soon as possible: Allergic reactions--skin rash, itching, hives, swelling of the face, lips, tongue, or throat Dry cough, shortness of breath or trouble breathing Eye pain, redness, irritation, or discharge with blurry or decreased vision Heart muscle inflammation--unusual weakness or fatigue, shortness of breath, chest pain, fast or irregular heartbeat, dizziness, swelling of the ankles, feet, or hands Hormone gland problems--headache, sensitivity to light, unusual weakness or fatigue, dizziness, fast or irregular heartbeat, increased sensitivity to cold or heat, excessive sweating, constipation, hair loss, increased thirst or amount of urine, tremors or shaking, irritability Infusion reactions--chest pain, shortness of breath or trouble breathing, feeling faint or lightheaded Kidney injury (glomerulonephritis)--decrease in the amount of urine, red or dark brown urine, foamy or bubbly urine, swelling of the ankles, hands, or feet Liver injury--right upper belly pain, loss of appetite, nausea, light-colored stool, dark yellow or brown urine, yellowing skin or eyes, unusual weakness or fatigue Pain, tingling, or numbness in the hands or feet, muscle weakness,   muscle weakness, change in vision, confusion or trouble speaking, loss of balance or coordination, trouble walking, seizures Rash, fever, and swollen lymph nodes Redness, blistering, peeling, or loosening of the skin, including inside the mouth Sudden or severe stomach pain, bloody diarrhea, fever, nausea, vomiting Side effects that usually do not require medical attention (report these to your care team if they continue or are  bothersome): Bone, joint, or muscle pain Diarrhea Fatigue Loss of appetite Nausea Skin rash This list may not describe all possible side effects. Call your doctor for medical advice about side effects. You may report side effects to FDA at 1-800-FDA-1088. Where should I keep my medication? This medication is given in a hospital or clinic. It will not be stored at home. NOTE: This sheet is a summary. It may not cover all possible information. If you have questions about this medicine, talk to your doctor, pharmacist, or health care provider.  2024 Elsevier/Gold Standard (2021-07-12 00:00:00)

## 2022-12-28 ENCOUNTER — Other Ambulatory Visit: Payer: Self-pay | Admitting: Nurse Practitioner

## 2022-12-28 ENCOUNTER — Telehealth: Payer: Self-pay

## 2022-12-28 ENCOUNTER — Other Ambulatory Visit (HOSPITAL_BASED_OUTPATIENT_CLINIC_OR_DEPARTMENT_OTHER): Payer: Self-pay

## 2022-12-28 ENCOUNTER — Other Ambulatory Visit: Payer: Self-pay

## 2022-12-28 ENCOUNTER — Encounter: Admit: 2022-12-28 | Payer: BLUE CROSS/BLUE SHIELD | Attending: Internal Medicine | Primary: Internal Medicine

## 2022-12-28 DIAGNOSIS — C210 Malignant neoplasm of anus, unspecified: Secondary | ICD-10-CM

## 2022-12-28 DIAGNOSIS — E860 Dehydration: Secondary | ICD-10-CM

## 2022-12-28 MED ORDER — OXYCODONE-ACETAMINOPHEN 5-325 MG PO TABS
1.0000 | ORAL_TABLET | Freq: Four times a day (QID) | ORAL | 0 refills | Status: DC | PRN
Start: 2022-12-28 — End: 2023-01-09
  Filled 2022-12-28: qty 60, 8d supply, fill #0

## 2022-12-28 NOTE — Telephone Encounter (Signed)
Forwarded to NP.

## 2022-12-28 NOTE — Telephone Encounter (Signed)
Silva Bandy, a physical therapist from [facility], called to report that the patient's heart rate ranged from 119 to 124 at rest, and their blood pressure was 92/60. I discussed these concerns with the nurse practitioner and recommended that the patient come in for an intravenous fluid (IVF) treatment, to which the patient agreed. An appointment has been scheduled, and the order has been placed.

## 2022-12-29 ENCOUNTER — Inpatient Hospital Stay: Payer: Medicare Other | Attending: Oncology

## 2022-12-29 VITALS — BP 90/54 | HR 96 | Temp 98.2°F | Resp 18

## 2022-12-29 DIAGNOSIS — Z5112 Encounter for antineoplastic immunotherapy: Secondary | ICD-10-CM | POA: Diagnosis present

## 2022-12-29 DIAGNOSIS — C787 Secondary malignant neoplasm of liver and intrahepatic bile duct: Secondary | ICD-10-CM | POA: Diagnosis not present

## 2022-12-29 DIAGNOSIS — C210 Malignant neoplasm of anus, unspecified: Secondary | ICD-10-CM | POA: Diagnosis present

## 2022-12-29 DIAGNOSIS — Z79620 Long term (current) use of immunosuppressive biologic: Secondary | ICD-10-CM | POA: Diagnosis not present

## 2022-12-29 DIAGNOSIS — E860 Dehydration: Secondary | ICD-10-CM | POA: Diagnosis not present

## 2022-12-29 DIAGNOSIS — Z95828 Presence of other vascular implants and grafts: Secondary | ICD-10-CM

## 2022-12-29 MED ORDER — SODIUM CHLORIDE 0.9% FLUSH
10.0000 mL | Freq: Once | INTRAVENOUS | Status: AC
  Administered 2022-12-29: 10 mL via INTRAVENOUS

## 2022-12-29 MED ORDER — SODIUM CHLORIDE 0.9 % IV SOLN: INTRAVENOUS | Status: DC

## 2022-12-29 MED ORDER — HEPARIN SOD (PORK) LOCK FLUSH 100 UNIT/ML IV SOLN
500.0000 [IU] | Freq: Once | INTRAVENOUS | Status: AC
  Administered 2022-12-29: 500 [IU] via INTRAVENOUS

## 2022-12-29 NOTE — Patient Instructions (Addendum)

## 2022-12-30 ENCOUNTER — Inpatient Hospital Stay: Admit: 2022-12-30 | Discharge: 2022-12-30 | Payer: BLUE CROSS/BLUE SHIELD | Primary: Internal Medicine

## 2022-12-30 ENCOUNTER — Ambulatory Visit: Admit: 2022-12-30 | Payer: BLUE CROSS/BLUE SHIELD | Attending: Adult Health | Primary: Internal Medicine

## 2022-12-30 ENCOUNTER — Ambulatory Visit: Admit: 2022-12-30 | Payer: BLUE CROSS/BLUE SHIELD | Primary: Internal Medicine

## 2022-12-30 DIAGNOSIS — K5900 Constipation, unspecified: Secondary | ICD-10-CM

## 2022-12-30 DIAGNOSIS — F17200 Nicotine dependence, unspecified, uncomplicated: Secondary | ICD-10-CM

## 2022-12-30 DIAGNOSIS — Z880 Allergy status to penicillin: Secondary | ICD-10-CM

## 2022-12-30 DIAGNOSIS — C3411 Malignant neoplasm of upper lobe, right bronchus or lung: Secondary | ICD-10-CM

## 2022-12-30 DIAGNOSIS — J449 Chronic obstructive pulmonary disease, unspecified: Secondary | ICD-10-CM

## 2022-12-30 DIAGNOSIS — Z5112 Encounter for antineoplastic immunotherapy: Secondary | ICD-10-CM

## 2022-12-30 DIAGNOSIS — R300 Dysuria: Secondary | ICD-10-CM

## 2022-12-30 DIAGNOSIS — C3490 Malignant neoplasm of unspecified part of unspecified bronchus or lung: Secondary | ICD-10-CM

## 2022-12-30 DIAGNOSIS — Z79899 Other long term (current) drug therapy: Secondary | ICD-10-CM

## 2022-12-30 DIAGNOSIS — R52 Pain, unspecified: Secondary | ICD-10-CM

## 2022-12-30 LAB — CBC WITH AUTO DIFFERENTIAL
BKR WAM ABSOLUTE IMMATURE GRANULOCYTES.: 0.04 x 1000/ÂµL (ref 0.00–0.30)
BKR WAM ABSOLUTE LYMPHOCYTE COUNT.: 0.61 x 1000/ÂµL (ref 0.60–3.70)
BKR WAM ABSOLUTE NRBC (2 DEC): 0 x 1000/ÂµL (ref 0.00–1.00)
BKR WAM ANC (ABSOLUTE NEUTROPHIL COUNT): 6.07 x 1000/ÂµL (ref 2.00–7.60)
BKR WAM BASOPHIL ABSOLUTE COUNT.: 0.04 x 1000/ÂµL (ref 0.00–1.00)
BKR WAM BASOPHILS: 0.5 % (ref 0.0–1.4)
BKR WAM EOSINOPHIL ABSOLUTE COUNT.: 0.09 x 1000/ÂµL (ref 0.00–1.00)
BKR WAM EOSINOPHILS: 1.2 % (ref 0.0–5.0)
BKR WAM HEMATOCRIT (2 DEC): 44.8 % (ref 35.00–45.00)
BKR WAM HEMOGLOBIN: 14.8 g/dL (ref 11.7–15.5)
BKR WAM IMMATURE GRANULOCYTES: 0.5 % (ref 0.0–1.0)
BKR WAM LYMPHOCYTES: 8.3 % — ABNORMAL LOW (ref 17.0–50.0)
BKR WAM MCH (PG): 32.3 pg (ref 27.0–33.0)
BKR WAM MCHC: 33 g/dL (ref 31.0–36.0)
BKR WAM MCV: 97.8 fL (ref 80.0–100.0)
BKR WAM MONOCYTE ABSOLUTE COUNT.: 0.47 x 1000/ÂµL (ref 0.00–1.00)
BKR WAM MONOCYTES: 6.4 % (ref 4.0–12.0)
BKR WAM MPV: 8.4 fL (ref 8.0–12.0)
BKR WAM NEUTROPHILS: 83.1 % — ABNORMAL HIGH (ref 39.0–72.0)
BKR WAM NUCLEATED RED BLOOD CELLS: 0 % (ref 0.0–1.0)
BKR WAM PLATELETS: 307 x1000/ÂµL (ref 150–420)
BKR WAM RDW-CV: 12.4 % (ref 11.0–15.0)
BKR WAM RED BLOOD CELL COUNT.: 4.58 M/ÂµL (ref 4.00–6.00)
BKR WAM WHITE BLOOD CELL COUNT: 7.3 x1000/ÂµL (ref 4.0–11.0)

## 2022-12-30 LAB — COMPREHENSIVE METABOLIC PANEL
BKR A/G RATIO: 1.1 (ref 1.0–2.2)
BKR ALANINE AMINOTRANSFERASE (ALT): 10 U/L (ref 10–35)
BKR ALBUMIN: 4.1 g/dL (ref 3.6–5.1)
BKR ALKALINE PHOSPHATASE: 79 U/L (ref 9–122)
BKR ANION GAP: 10 (ref 7–17)
BKR ASPARTATE AMINOTRANSFERASE (AST): 18 U/L (ref 10–35)
BKR AST/ALT RATIO: 1.8
BKR BILIRUBIN TOTAL: 0.3 mg/dL (ref <=1.2)
BKR BLOOD UREA NITROGEN: 21 mg/dL (ref 8–23)
BKR BUN / CREAT RATIO: 42 — ABNORMAL HIGH (ref 8.0–23.0)
BKR CALCIUM: 9.9 mg/dL (ref 8.8–10.2)
BKR CHLORIDE: 102 mmol/L (ref 98–107)
BKR CO2: 26 mmol/L (ref 20–30)
BKR CREATININE: 0.5 mg/dL (ref 0.40–1.30)
BKR EGFR, CREATININE (CKD-EPI 2021): >60 mL/min/{1.73_m2} (ref >=60)
BKR GLOBULIN: 3.6 g/dL (ref 2.0–3.9)
BKR GLUCOSE: 89 mg/dL (ref 70–100)
BKR POTASSIUM: 4.3 mmol/L (ref 3.3–5.3)
BKR PROTEIN TOTAL: 7.7 g/dL (ref 5.9–8.3)
BKR SODIUM: 138 mmol/L (ref 136–144)

## 2022-12-30 MED ORDER — SODIUM CHLORIDE 0.9 % INTRAVENOUS SOLUTION
INTRAVENOUS | Status: AC
Start: 2022-12-30 — End: ?
  Administered 2022-12-30: 20:00:00 via INTRAVENOUS

## 2022-12-30 MED ORDER — DURVALUMAB INFUSION
Freq: Once | INTRAVENOUS | Status: CP
Start: 2022-12-30 — End: ?
  Administered 2022-12-30: 19:00:00 250.000 mL/h via INTRAVENOUS

## 2022-12-30 NOTE — Progress Notes
 [name] arrived to clinic for D1C15 Durvalumab. Peripheral labs and PIV done by 2nd floor RN. Seen and cleared by E. Duffield, APRN. Labs within treatment parameters. Complete Blood CountResults in Past 7 DaysResult Component Current Result Hematocrit 44.80 (12/30/2022) Hemoglobin 14.8 (12/30/2022) MCH 32.3 (12/30/2022) MCHC 33.0 (12/30/2022) MCV 97.8 (12/30/2022) MPV 8.4 (12/30/2022) Platelets 307 (12/30/2022) RBC 4.58 (12/30/2022) WBC 7.3 (12/30/2022)   Chemistry  Lab Results Component Value Date  NA 138 12/30/2022  K 4.3 12/30/2022  CL 102 12/30/2022  CO2 26 12/30/2022  BUN 21 12/30/2022  CREATININE 0.50 12/30/2022  GLU 89 12/30/2022  Lab Results Component Value Date  CALCIUM 9.9 12/30/2022  ALKPHOS 79 12/30/2022  AST 18 12/30/2022  ALT 10 12/30/2022  BILITOT 0.3 12/30/2022  [name] states she has generalized pain but did not want to discuss further. She did not answer assessment questions as she had just seen the provider. Durvalumab infused over 60 minutes per orders. NS administered. PIV flushed with NS. + BR prior to d/c. Urine sample collected and sent to lab. [name] discharged in stable condition. RTC in 2 weeks for labs, provider and treatment. Reviewed reportable symptoms. Electronically Signed by Marin Olp, RN, December 30, 2022

## 2022-12-31 LAB — URINALYSIS WITH CULTURE REFLEX      (BH LMW YH)
BKR BILIRUBIN, UA: NEGATIVE
BKR BLOOD, UA: NEGATIVE
BKR GLUCOSE, UA: NEGATIVE
BKR KETONES, UA: NEGATIVE
BKR NITRITE, UA: POSITIVE — AB
BKR PH, UA: 5.5 (ref 5.5–7.5)
BKR SPECIFIC GRAVITY, UA: 1.022 (ref 1.005–1.030)
BKR UROBILINOGEN, UA (MG/DL): <2 mg/dL (ref <=2.0)

## 2022-12-31 LAB — URINE MICROSCOPIC     (BH GH LMW YH)

## 2022-12-31 LAB — HEPATITIS B CORE ANTIBODY, TOTAL: BKR HEPATITIS B CORE TOTAL ANTIBODY: POSITIVE — AB

## 2022-12-31 LAB — HEPATITIS C AB WITH REFLEX TO HCV PCR
BKR HEPATITIS C ANTIBODY INITIAL RESULT: 13.69
BKR HEPATITIS C ANTIBODY: POSITIVE — AB

## 2022-12-31 LAB — HEPATITIS B SURFACE ANTIGEN     (BH GH L LMW YH): BKR HEPATITIS B SURFACE ANTIGEN: NEGATIVE

## 2022-12-31 LAB — UA REFLEX CULTURE

## 2022-12-31 LAB — HEPATITIS B SURFACE ANTIBODY     (BH GH L LMW YH): BKR HEP B SURFACE AB INITIAL RESULT: 185.96 m[IU]/mL (ref >=12)

## 2023-01-01 LAB — URINE CULTURE

## 2023-01-02 ENCOUNTER — Encounter: Admit: 2023-01-02 | Payer: BLUE CROSS/BLUE SHIELD | Attending: Gastroenterology | Primary: Internal Medicine

## 2023-01-02 NOTE — Progress Notes
 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 female active smoker (~50 pack-years) with a history of COPD and a now biopsy-proven NSCLC (keratinizing SCC) of the RUL with invasion of the posterior chest wall and ribs 5-6 (PD-L1 TPS 5%, at least cT3N0). She additionally has lesion in the LEFT lung currently under surveillance. 06/25/2022 [facility] A/P showed findings concerning for colitis predominant involvement of the right colon and cecum. 07/01/2022 [place] chest showed RIGHT upper lobe mass with chest wall invasion. Mass measures 5.1 x 3.3 x 4.9 cm. This mass erodes into the posterior chest wall with adjacent erosion of the RIGHT posterior fifth and sixth ribs. 07/06/2022 FEV1 37% predicted, FEV1/FVC 35%, DLCO 20%. Severe obstruction, severely impaired diffusing capacity. 07/09/2022 MRI brain with no evidence of metastatic disease to the brain. 07/16/2022 FDG PET/ showed intensely hypermetabolic RIGHT upper lobe pleural based lung mass with chest wall and right 5-6th ribs invasion. Biapical hypermetabolic scarring with more prominent on the LEFT apical nodularity, nonspecific but concerning for malignant involvement.She met with Dr Maxcine Ham (med-onc) and Dr Valora Corporal (Rad onc) - decision reached to proceed with concurrent chemo/RTPRIOR THERAPY: C1D1 carbo/taxol weekly w RT on 6/11/24Completed RT 8/2/24CURRENT THERAPY: C1D1 Durvalumab given 9/20/24INTERIM HISTORY: Courtney Knox presents to continue therapy w durva today. She is accompanied by her husband Still sometimes has blood in the urine, has not been taking the macrobid as orderedHasn't started the meloxicam for pain that is all over  - abdomen, hips, knees and hands.Trying to take zoloft consistently but finds it challenging to remember to take her pills as orderedBowels moving - usually softer stools.Wears depends, feels overwhelmed by having to change everything PO intake decreasedEncouraged ensure to increase calorie intakeChronic cough persists, no worsening shortness of breath. She denies headaches, dizziness, fevers, chills, rash and neuropathy. REVIEW OF PAST MEDICAL,SURGICAL,SOCIAL,FAMILY HISTORY: reviewed and unchangedShe  has a past medical history of Anxiety, COPD (chronic obstructive pulmonary disease) (HC Code), Depression, HCV (hepatitis C virus), Lung cancer (HC Code), Memory loss, and Tobacco abuse.She has had a Left wrist surgery. 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 Continues to smoke with roughly 40 pack year history. FAMILY HISTORY:  Father, lung cancer; brother, lymphomaReview of SystemsPertinent items are noted in HPI.A 12- point review of systems, was otherwise negative except as stated in the HPI. ECOG 1ALLERGIES: PenicillinMEDICATIONS: Patient has a current medication list which includes the following prescription(s): b complex vitamins (B COMPLEX) tablet - Take 1 tablet by mouth daily (Patient not taking: Reported on 10/28/2022), calcium carbonate/vitamin D3 (CALCIUM WITH VITAMIN D3 ORAL) - Take 1 tablet by mouth daily (Patient not taking: Reported on 10/28/2022), EPCLUSA 400-100 mg per tablet - Take 1 tablet by mouth daily (Patient not taking: Reported on 11/16/2022), famotidine (PEPCID) 20 mg tablet - Take 1 tablet (20 mg total) by mouth daily (Patient not taking: Reported on 11/16/2022), hydrOXYzine (ATARAX) 25 mg tablet - Take 1 tablet (25 mg total) by mouth 3 (three) times daily as needed for itching (Patient not taking: Reported on 11/16/2022), meloxicam (MOBIC) 15 mg tablet - Take 1 tablet (15 mg total) by mouth daily, nitrofurantoin, macrocrystal-monohydrate, (MACROBID) 100 mg capsule - Take 1 capsule (100 mg total) by mouth every 12 (twelve) hours for 10 days, sertraline (ZOLOFT) 25 mg tablet - Take 1 tablet (25 mg total) by mouth daily,  and traMADoL (ULTRAM) 50 mg tablet - Take 1 tablet (50 mg total) by mouth (Patient not taking: Reported on 11/16/2022).PHYSICAL EXAM:BP 101/68  - Pulse 68  - Temp 97.3 ?F (36.3 ?C) (Temporal)  - Resp 18  - Wt 41.1 kg  - SpO2 96%  - BMI 16.84 kg/m?  Physical ExamConstitutional:     Appearance: Normal appearance. HENT:    Head: Normocephalic and atraumatic. Eyes:    Conjunctiva/sclera: Conjunctivae normal. Cardiovascular:    Rate and Rhythm: Normal rate and regular rhythm. Pulmonary:    Effort: Pulmonary effort is normal.    Breath sounds: No wheezing, rhonchi or rales.    Comments: Breath sounds decreased to lung basesAbdominal:    General: Bowel sounds are normal. There is no distension.    Palpations: Abdomen is soft.    Tenderness: There is no abdominal tenderness. Musculoskeletal:       General: Normal range of motion.    Cervical back: Normal range of motion.    Right lower leg: No edema.    Left lower leg: No edema.    Comments: TTP to right upper back Lymphadenopathy:    Cervical: No cervical adenopathy. Skin:   General: Skin is warm and dry.    Findings: No erythema or rash. Neurological:    General: No focal deficit present.    Mental Status: She is alert and oriented to person, place, and time. Psychiatric:    Comments: Pressured/tangential speech at times, labile mood Data Review:Lab Results Component Value Date  WBC 7.3 12/30/2022  RBC 4.58 12/30/2022  HGB 14.8 12/30/2022  HCT 44.80 12/30/2022  MCV 97.8 12/30/2022  MCH 32.3 12/30/2022  MCHC 33.0 12/30/2022  PLT 307 12/30/2022  MPV 8.4 12/30/2022 Lab Results Component Value Date  GLU 89 12/30/2022  BUN 21 12/30/2022  CREATININE 0.50 12/30/2022  NA 138 12/30/2022  K 4.3 12/30/2022  CL 102 12/30/2022  CO2 26 12/30/2022  ALBUMIN 4.1 12/30/2022  PROT 7.7 12/30/2022  BILITOT 0.3 12/30/2022  ALKPHOS 79 12/30/2022  ALT 10 12/30/2022  GLOB 3.6 12/30/2022  CALCIUM 9.9 12/30/2022  IMPRESSION:  Malignant neoplasm of bronchus and lung (HC Code)  (primary encounter diagnosis)[name] is a 67 year old woman diagnosed in the spring of 2024 with 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 She did ultimately go to the ED but left before any testing was performed. 10/28/22 - completed concurrent therapy. 12/16/22 - C1D1 durvalumab10/4/24 - Today she presents with her husband to continue durvalumab. She tolerated the first dose well and agreed to proceed with treatment today. Blood in the urine persists, now intermittent. She has not been taking the macrobid as ordered. Will try to be more consistent with taking her meds as ordered. I recommended that she get a pill box so she can set out a weeks worth of pills and have a visual cue for when she needs to take her meds. She will consider this.  Labs, vitals, ROS reviewed, physical exam completed. Patient meets criteria to proceed with treatment today. PLAN: NSCLC - Proceed with C1D15 durva todayBlood in urine - send repeat UA/culture	- Urine culture without obvious infection at this time, she will complete the course of macrobid. Constipation - continue miralax daily as needed. Pain - to start meloxicamPO intake decreased - offered nutrition assistance  but she has previously declined. 	- will focus on frequent small meals, maximizing calorie intake. 	- will try to resume ensure dailyLabile mood - supported her effort to take sertraline more consistentlyDecreased hearing - will consider referral to ENT/audiology in the future. Patient to call with new or worsening symptoms prior to the next visit. RTC in 2 weeks for next infusion. Scans due in approx 3 months from prior imaging.

## 2023-01-03 ENCOUNTER — Other Ambulatory Visit: Payer: Self-pay | Admitting: *Deleted

## 2023-01-03 ENCOUNTER — Encounter: Admit: 2023-01-03 | Payer: PRIVATE HEALTH INSURANCE | Primary: Internal Medicine

## 2023-01-03 DIAGNOSIS — C210 Malignant neoplasm of anus, unspecified: Secondary | ICD-10-CM

## 2023-01-03 LAB — HEPATITIS C VIRUS BY RT-PCR, QUANTITATIVE     (BH GH L LMW YH)
BKR HCV QNT (COPIES) (NYS ONLY): NOT DETECTED [IU]/mL
BKR HCV QNT (LOG 10) (NYS ONLY): NOT DETECTED {Log_IU}/mL
BKR HEPATITIS C VIRUS, QUANT. PCR, SERUM: NOT DETECTED

## 2023-01-04 ENCOUNTER — Inpatient Hospital Stay: Payer: Medicare Other | Admitting: Nutrition

## 2023-01-04 ENCOUNTER — Encounter: Payer: Self-pay | Admitting: Nutrition

## 2023-01-04 NOTE — Progress Notes (Deleted)
Patient scheduled for telephone nutrition follow up. Contacted however, she did not pick up. I left a VM encouraging her to reach out and return my call if she would like to discuss nutrition strategies or wanted to reschedule nutrition appointment.

## 2023-01-06 NOTE — Progress Notes (Deleted)
Error

## 2023-01-06 NOTE — Progress Notes (Deleted)
error 

## 2023-01-08 ENCOUNTER — Other Ambulatory Visit: Payer: Self-pay | Admitting: Oncology

## 2023-01-08 DIAGNOSIS — C210 Malignant neoplasm of anus, unspecified: Secondary | ICD-10-CM

## 2023-01-09 ENCOUNTER — Other Ambulatory Visit: Payer: Self-pay

## 2023-01-09 ENCOUNTER — Inpatient Hospital Stay: Payer: Medicare Other

## 2023-01-09 ENCOUNTER — Encounter: Payer: Self-pay | Admitting: *Deleted

## 2023-01-09 ENCOUNTER — Inpatient Hospital Stay: Payer: Medicare Other | Admitting: Oncology

## 2023-01-09 ENCOUNTER — Other Ambulatory Visit (HOSPITAL_BASED_OUTPATIENT_CLINIC_OR_DEPARTMENT_OTHER): Payer: Self-pay

## 2023-01-09 VITALS — BP 91/62 | HR 102 | Resp 18

## 2023-01-09 DIAGNOSIS — C210 Malignant neoplasm of anus, unspecified: Secondary | ICD-10-CM | POA: Diagnosis not present

## 2023-01-09 DIAGNOSIS — Z5112 Encounter for antineoplastic immunotherapy: Secondary | ICD-10-CM | POA: Diagnosis not present

## 2023-01-09 LAB — CBC WITH DIFFERENTIAL (CANCER CENTER ONLY)
Abs Immature Granulocytes: 0.1 10*3/uL — ABNORMAL HIGH (ref 0.00–0.07)
Basophils Absolute: 0 10*3/uL (ref 0.0–0.1)
Basophils Relative: 0 %
Eosinophils Absolute: 0 10*3/uL (ref 0.0–0.5)
Eosinophils Relative: 0 %
HCT: 26.5 % — ABNORMAL LOW (ref 36.0–46.0)
Hemoglobin: 8.7 g/dL — ABNORMAL LOW (ref 12.0–15.0)
Immature Granulocytes: 1 %
Lymphocytes Relative: 4 %
Lymphs Abs: 0.4 10*3/uL — ABNORMAL LOW (ref 0.7–4.0)
MCH: 31.4 pg (ref 26.0–34.0)
MCHC: 32.8 g/dL (ref 30.0–36.0)
MCV: 95.7 fL (ref 80.0–100.0)
Monocytes Absolute: 1 10*3/uL (ref 0.1–1.0)
Monocytes Relative: 9 %
Neutro Abs: 9.6 10*3/uL — ABNORMAL HIGH (ref 1.7–7.7)
Neutrophils Relative %: 86 %
Platelet Count: 387 10*3/uL (ref 150–400)
RBC: 2.77 MIL/uL — ABNORMAL LOW (ref 3.87–5.11)
RDW: 15.3 % (ref 11.5–15.5)
WBC Count: 11.2 10*3/uL — ABNORMAL HIGH (ref 4.0–10.5)
nRBC: 0 % (ref 0.0–0.2)

## 2023-01-09 LAB — CMP (CANCER CENTER ONLY)
ALT: 6 U/L (ref 0–44)
AST: 11 U/L — ABNORMAL LOW (ref 15–41)
Albumin: 3.1 g/dL — ABNORMAL LOW (ref 3.5–5.0)
Alkaline Phosphatase: 78 U/L (ref 38–126)
Anion gap: 10 (ref 5–15)
BUN: 13 mg/dL (ref 8–23)
CO2: 29 mmol/L (ref 22–32)
Calcium: 8.7 mg/dL — ABNORMAL LOW (ref 8.9–10.3)
Chloride: 95 mmol/L — ABNORMAL LOW (ref 98–111)
Creatinine: 0.58 mg/dL (ref 0.44–1.00)
GFR, Estimated: >60 mL/min (ref >60)
Glucose, Bld: 106 mg/dL — ABNORMAL HIGH (ref 70–99)
Potassium: 3.4 mmol/L — ABNORMAL LOW (ref 3.5–5.1)
Sodium: 134 mmol/L — ABNORMAL LOW (ref 135–145)
Total Bilirubin: 0.8 mg/dL (ref 0.3–1.2)
Total Protein: 5.9 g/dL — ABNORMAL LOW (ref 6.5–8.1)

## 2023-01-09 LAB — TSH: TSH: 1.398 u[IU]/mL (ref 0.350–4.500)

## 2023-01-09 MED ORDER — SODIUM CHLORIDE 0.9% FLUSH
10.0000 mL | INTRAVENOUS | Status: DC | PRN
  Administered 2023-01-09: 10 mL

## 2023-01-09 MED ORDER — HEPARIN SOD (PORK) LOCK FLUSH 100 UNIT/ML IV SOLN
500.0000 [IU] | Freq: Once | INTRAVENOUS | Status: AC | PRN
  Administered 2023-01-09: 500 [IU]

## 2023-01-09 MED ORDER — SODIUM CHLORIDE 0.9 % IV SOLN: Freq: Once | INTRAVENOUS | Status: AC

## 2023-01-09 MED ORDER — HYDROMORPHONE HCL 2 MG PO TABS
2.0000 mg | ORAL_TABLET | ORAL | 0 refills | Status: DC | PRN
Start: 2023-01-09 — End: 2023-01-23
  Filled 2023-01-09: qty 60, 15d supply, fill #0

## 2023-01-09 MED ORDER — SODIUM CHLORIDE 0.9 % IV SOLN
240.0000 mg | Freq: Once | INTRAVENOUS | Status: AC
  Administered 2023-01-09: 240 mg via INTRAVENOUS
  Filled 2023-01-09: qty 24

## 2023-01-09 NOTE — Progress Notes (Signed)
Patient seen by Dr. [name] today  Vitals are within treatment parameters: BP  88/58--OK to proceed  Labs are within treatment parameters: Yes   Treatment plan has been signed: Yes   Per physician team, Patient is ready for treatment and there are NO modifications to the treatment plan.

## 2023-01-09 NOTE — Progress Notes (Signed)
[facility] OFFICE PROGRESS NOTE   Diagnosis: Anal cancer  INTERVAL HISTORY:   Ms. Nickell returns as scheduled.  She completed another treat with nivolumab on 12/26/2022.  No rash or diarrhea.  She is having bowel movements.  She has blood with bowel movements and wiping.  She continues to have rectal pain.  The pain is not relieved with oxycodone.  She has developed a "sore "at the left trochanter.  She remains weak.  She reports her appetite has improved.  Objective:  Vital signs in last 24 hours:  Blood pressure (!) 88/58, pulse 100, temperature 98.2 F (36.8 C), temperature source Temporal, resp. rate 18, height 5\' 7"  (1.702 m), weight 124 lb (56.2 kg), SpO2 (!) 89%.    Resp: Decreased breath sounds at the right compared to the left posterior chest, no respiratory distress Cardio: Regular rate and rhythm GI: No hepatomegaly, nontender, no mass.  Rectal-external exam reveals an ulcerated mass at the right anal verge Vascular: No leg edema  Skin: Superficial skin breakdown at the left trochanter  Portacath/PICC-without erythema  Lab Results:  Lab Results  Component Value Date   WBC 11.2 (H) 01/09/2023   HGB 8.7 (L) 01/09/2023   HCT 26.5 (L) 01/09/2023   MCV 95.7 01/09/2023   PLT 387 01/09/2023   NEUTROABS 9.6 (H) 01/09/2023    CMP  Lab Results  Component Value Date   NA 134 (L) 01/09/2023   K 3.4 (L) 01/09/2023   CL 95 (L) 01/09/2023   CO2 29 01/09/2023   GLUCOSE 106 (H) 01/09/2023   BUN 13 01/09/2023   CREATININE 0.58 01/09/2023   CALCIUM 8.7 (L) 01/09/2023   PROT 5.9 (L) 01/09/2023   ALBUMIN 3.1 (L) 01/09/2023   AST 11 (L) 01/09/2023   ALT 6 01/09/2023   ALKPHOS 78 01/09/2023   BILITOT 0.8 01/09/2023   GFRNONAA >60 01/09/2023    Medications: I have reviewed the patient's current medications.   Assessment/Plan:  Anal cancer Anal mass noted on digital exam and colonoscopy 05/09/2022-biopsy consistent with squamous cell carcinoma CTs  05/12/2022-no evidence of thoracic metastatic disease, calcified right pleural plaques, asymmetric thickening of the right posterior wall of the distal rectum/anal canal, no evidence of metastatic adenopathy in the abdomen or pelvis, 2 small hypodense left liver lesions favored to represent benign cysts PET scan 05/30/2022-intense hypermetabolic activity at the anus.  No evidence of metastatic adenopathy.  Indeterminate small, mildly hypermetabolic lesion in the lateral segment of the left hepatic lobe. Radiation 06/06/2022-07/19/2022 Cycle one 5-FU/Mitomycin-C 06/06/2022 cycle two 5-FU/Mitomycin-C 07/04/2022 (5-FU dose reduced secondary to diarrhea) MRI liver 06/15/2022-1 cm hypervascular mass in segment 2 of the left hepatic lobe, corresponds to the focus of hypermetabolism on recent PET-CT.  Imaging characteristics atypical for metastatic squamous cell carcinoma.  Follow-up MRI in 3 months Cycle two 5-FU/Mitomycin-C 07/04/2022 (5-FU dose reduced secondary to diarrhea) 08/29/2022 anoscopy, Dr. Maisie Fus, smaller posterior midline rectal mass approximately in the dentate line MRI liver 09/12/2022-enlargement of a complex cystic and solid-appearing enhancing lesion in segment 2 of the liver, 4 x 2.2 cm compared to 1.5 cm on previous study PET scan 09/23/2022-interval increase in size and tracer avidity of a solitary metastasis within the lateral segment of left hepatic lobe.  Tracer avid lesion involving the anus again noted, slightly decreased in size and tracer avidity. 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 Modesta Messing 10/10/2022, presented at multidisciplinary clinic-recommendation for 3 months of treatment with systemic therapy to address the liver followed  by restaging scan and also MRI of the pelvis to assess the primary site  Cycle 1 Taxol/carboplatin 11/03/2022 12/07/2022 chemotherapy discontinued due to poor performance status Cycle 1 nivolumab 12/12/2022 Cycle 2 nivolumab  12/26/2022 Cycle 3 nivolumab 01/09/2023 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  She has completed 2 treatments with nivolumab.  She has tolerated the treatment well to date.  She will complete cycle 3 today.  There is a persistent anal mass.  She was given skin care to the superficial breakdown at the left trochanter.  She will begin a trial of hydromorphone for pain.  Ms. Meditz will return for an office visit nivolumab in 2 weeks.  She will be referred for restaging CTs after 5-6 treatments with nivolumab.  [name], MD  01/09/2023  10:25 AM

## 2023-01-09 NOTE — Patient Instructions (Signed)
[place] AT [facility]   Discharge Instructions: Thank you for choosing [facility] to provide your oncology and hematology care.   If you have a lab appointment with the Cancer Center, please go directly to the Cancer Center and check in at the registration area.   Wear comfortable clothing and clothing appropriate for easy access to any Portacath or PICC line.   We strive to give you quality time with your provider. You may need to reschedule your appointment if you arrive late (15 or more minutes).  Arriving late affects you and other patients whose appointments are after yours.  Also, if you miss three or more appointments without notifying the office, you may be dismissed from the clinic at the provider's discretion.      For prescription refill requests, have your pharmacy contact our office and allow 72 hours for refills to be completed.    Today you received the following chemotherapy and/or immunotherapy agents Nivolumab (OPDIVO).      To help prevent nausea and vomiting after your treatment, we encourage you to take your nausea medication as directed.  BELOW ARE SYMPTOMS THAT SHOULD BE REPORTED IMMEDIATELY: *FEVER GREATER THAN 100.4 F (38 C) OR HIGHER *CHILLS OR SWEATING *NAUSEA AND VOMITING THAT IS NOT CONTROLLED WITH YOUR NAUSEA MEDICATION *UNUSUAL SHORTNESS OF BREATH *UNUSUAL BRUISING OR BLEEDING *URINARY PROBLEMS (pain or burning when urinating, or frequent urination) *BOWEL PROBLEMS (unusual diarrhea, constipation, pain near the anus) TENDERNESS IN MOUTH AND THROAT WITH OR WITHOUT PRESENCE OF ULCERS (sore throat, sores in mouth, or a toothache) UNUSUAL RASH, SWELLING OR PAIN  UNUSUAL VAGINAL DISCHARGE OR ITCHING   Items with * indicate a potential emergency and should be followed up as soon as possible or go to the Emergency Department if any problems should occur.  Please show the CHEMOTHERAPY ALERT CARD or IMMUNOTHERAPY ALERT CARD  at check-in to the Emergency Department and triage nurse.  Should you have questions after your visit or need to cancel or reschedule your appointment, please contact [place] [facility] AT [facility]  Dept: [phone]  and follow the prompts.  Office hours are 8:00 a.m. to 4:30 p.m. Monday - Friday. Please note that voicemails left after 4:00 p.m. may not be returned until the following business day.  We are closed weekends and major holidays. You have access to a nurse at all times for urgent questions. Please call the main number to the clinic Dept: [phone] and follow the prompts.   For any non-urgent questions, you may also contact your provider using MyChart. We now offer e-Visits for anyone 49 and older to request care online for non-urgent symptoms. For details visit [email or web address].   Also download the MyChart app! Go to the app store, search "MyChart", open the app, select [place], and log in with your MyChart username and password.  Nivolumab Injection What is this medication? NIVOLUMAB (nye VOL ue mab) treats some types of cancer. It works by helping your immune system slow or stop the spread of cancer cells. It is a monoclonal antibody. This medicine may be used for other purposes; ask your health care provider or pharmacist if you have questions. COMMON BRAND NAME(S): Opdivo What should I tell my care team before I take this medication? They need to know if you have any of these conditions: Allogeneic stem cell transplant (uses someone else's stem cells) Autoimmune diseases, such as Crohn disease, ulcerative colitis, lupus History of chest radiation Nervous system  problems, such as Guillain-Barre syndrome or myasthenia gravis Organ transplant An unusual or allergic reaction to nivolumab, other medications, foods, dyes, or preservatives Pregnant or trying to get pregnant Breast-feeding How should I use this medication? This medication is infused  into a vein. It is given in a hospital or clinic setting. A special MedGuide will be given to you before each treatment. Be sure to read this information carefully each time. Talk to your care team about the use of this medication in children. While it may be prescribed for children as young as 12 years for selected conditions, precautions do apply. Overdosage: If you think you have taken too much of this medicine contact a poison control center or emergency room at once. NOTE: This medicine is only for you. Do not share this medicine with others. What if I miss a dose? Keep appointments for follow-up doses. It is important not to miss your dose. Call your care team if you are unable to keep an appointment. What may interact with this medication? Interactions have not been studied. This list may not describe all possible interactions. Give your health care provider a list of all the medicines, herbs, non-prescription drugs, or dietary supplements you use. Also tell them if you smoke, drink alcohol, or use illegal drugs. Some items may interact with your medicine. What should I watch for while using this medication? Your condition will be monitored carefully while you are receiving this medication. You may need blood work while taking this medication. This medication may cause serious skin reactions. They can happen weeks to months after starting the medication. Contact your care team right away if you notice fevers or flu-like symptoms with a rash. The rash may be red or purple and then turn into blisters or peeling of the skin. You may also notice a red rash with swelling of the face, lips, or lymph nodes in your neck or under your arms. Tell your care team right away if you have any change in your eyesight. Talk to your care team if you are pregnant or think you might be pregnant. A negative pregnancy test is required before starting this medication. A reliable form of contraception is recommended  while taking this medication and for 5 months after the last dose. Talk to your care team about effective forms of contraception. Do not breast-feed while taking this medication and for 5 months after the last dose. What side effects may I notice from receiving this medication? Side effects that you should report to your care team as soon as possible: Allergic reactions--skin rash, itching, hives, swelling of the face, lips, tongue, or throat Dry cough, shortness of breath or trouble breathing Eye pain, redness, irritation, or discharge with blurry or decreased vision Heart muscle inflammation--unusual weakness or fatigue, shortness of breath, chest pain, fast or irregular heartbeat, dizziness, swelling of the ankles, feet, or hands Hormone gland problems--headache, sensitivity to light, unusual weakness or fatigue, dizziness, fast or irregular heartbeat, increased sensitivity to cold or heat, excessive sweating, constipation, hair loss, increased thirst or amount of urine, tremors or shaking, irritability Infusion reactions--chest pain, shortness of breath or trouble breathing, feeling faint or lightheaded Kidney injury (glomerulonephritis)--decrease in the amount of urine, red or dark brown urine, foamy or bubbly urine, swelling of the ankles, hands, or feet Liver injury--right upper belly pain, loss of appetite, nausea, light-colored stool, dark yellow or brown urine, yellowing skin or eyes, unusual weakness or fatigue Pain, tingling, or numbness in the hands or feet,  muscle weakness, change in vision, confusion or trouble speaking, loss of balance or coordination, trouble walking, seizures Rash, fever, and swollen lymph nodes Redness, blistering, peeling, or loosening of the skin, including inside the mouth Sudden or severe stomach pain, bloody diarrhea, fever, nausea, vomiting Side effects that usually do not require medical attention (report these to your care team if they continue or are  bothersome): Bone, joint, or muscle pain Diarrhea Fatigue Loss of appetite Nausea Skin rash This list may not describe all possible side effects. Call your doctor for medical advice about side effects. You may report side effects to FDA at 1-800-FDA-1088. Where should I keep my medication? This medication is given in a hospital or clinic. It will not be stored at home. NOTE: This sheet is a summary. It may not cover all possible information. If you have questions about this medicine, talk to your doctor, pharmacist, or health care provider.  2024 Elsevier/Gold Standard (2021-07-12 00:00:00)

## 2023-01-09 NOTE — Progress Notes (Signed)
Stage II skin breakdown left hip. Cleaned w/soap and water and applied Duoderm dressing explaining application procedure to daughter. Provided her wrapper to obtain more at medical supply.

## 2023-01-10 ENCOUNTER — Inpatient Hospital Stay: Payer: Medicare Other

## 2023-01-10 ENCOUNTER — Other Ambulatory Visit (HOSPITAL_BASED_OUTPATIENT_CLINIC_OR_DEPARTMENT_OTHER): Payer: Self-pay

## 2023-01-10 ENCOUNTER — Inpatient Hospital Stay: Payer: Medicare Other | Admitting: Oncology

## 2023-01-10 LAB — T4: T4, Total: 12.8 ug/dL — ABNORMAL HIGH (ref 4.5–12.0)

## 2023-01-11 ENCOUNTER — Telehealth: Payer: Self-pay

## 2023-01-11 NOTE — Telephone Encounter (Signed)
The patient called and left a message indicating that her pain medication is not providing relief. I returned the call to gather more information about her situation, but there was no answer. I left a message requesting that she return the call at her earliest convenience.

## 2023-01-12 ENCOUNTER — Encounter: Payer: Self-pay | Admitting: *Deleted

## 2023-01-12 NOTE — Progress Notes (Signed)
Notification from [facility]: Patient has declined #2 home health visits.

## 2023-01-13 ENCOUNTER — Ambulatory Visit: Admit: 2023-01-13 | Payer: BLUE CROSS/BLUE SHIELD | Attending: Hematology | Primary: Internal Medicine

## 2023-01-13 ENCOUNTER — Ambulatory Visit: Admit: 2023-01-13 | Payer: BLUE CROSS/BLUE SHIELD | Primary: Internal Medicine

## 2023-01-13 ENCOUNTER — Inpatient Hospital Stay: Admit: 2023-01-13 | Discharge: 2023-01-13 | Payer: BLUE CROSS/BLUE SHIELD | Primary: Internal Medicine

## 2023-01-13 ENCOUNTER — Encounter: Admit: 2023-01-13 | Payer: PRIVATE HEALTH INSURANCE | Attending: Hematology | Primary: Internal Medicine

## 2023-01-13 DIAGNOSIS — F32A Depression: Secondary | ICD-10-CM

## 2023-01-13 DIAGNOSIS — B1920 Unspecified viral hepatitis C without hepatic coma: Secondary | ICD-10-CM

## 2023-01-13 DIAGNOSIS — Z720 Tobacco use: Secondary | ICD-10-CM

## 2023-01-13 DIAGNOSIS — Z5112 Encounter for antineoplastic immunotherapy: Secondary | ICD-10-CM

## 2023-01-13 DIAGNOSIS — R413 Other amnesia: Secondary | ICD-10-CM

## 2023-01-13 DIAGNOSIS — F419 Anxiety disorder, unspecified: Secondary | ICD-10-CM

## 2023-01-13 DIAGNOSIS — Z79620 Long term (current) use of immunosuppressive biologic: Secondary | ICD-10-CM

## 2023-01-13 DIAGNOSIS — C3490 Malignant neoplasm of unspecified part of unspecified bronchus or lung: Secondary | ICD-10-CM

## 2023-01-13 DIAGNOSIS — J449 Chronic obstructive pulmonary disease, unspecified: Secondary | ICD-10-CM

## 2023-01-13 LAB — CBC WITH AUTO DIFFERENTIAL
BKR WAM ABSOLUTE IMMATURE GRANULOCYTES.: 0.02 x 1000/ÂµL (ref 0.00–0.30)
BKR WAM ABSOLUTE LYMPHOCYTE COUNT.: 0.48 x 1000/ÂµL — ABNORMAL LOW (ref 0.60–3.70)
BKR WAM ABSOLUTE NRBC (2 DEC): 0 x 1000/ÂµL (ref 0.00–1.00)
BKR WAM ANC (ABSOLUTE NEUTROPHIL COUNT): 5.18 x 1000/ÂµL (ref 2.00–7.60)
BKR WAM BASOPHIL ABSOLUTE COUNT.: 0.04 x 1000/ÂµL (ref 0.00–1.00)
BKR WAM BASOPHILS: 0.6 % (ref 0.0–1.4)
BKR WAM EOSINOPHIL ABSOLUTE COUNT.: 0.12 x 1000/ÂµL (ref 0.00–1.00)
BKR WAM EOSINOPHILS: 1.9 % (ref 0.0–5.0)
BKR WAM HEMATOCRIT (2 DEC): 46.6 % — ABNORMAL HIGH (ref 35.00–45.00)
BKR WAM HEMOGLOBIN: 15.1 g/dL (ref 11.7–15.5)
BKR WAM IMMATURE GRANULOCYTES: 0.3 % (ref 0.0–1.0)
BKR WAM LYMPHOCYTES: 7.6 % — ABNORMAL LOW (ref 17.0–50.0)
BKR WAM MCH (PG): 32 pg (ref 27.0–33.0)
BKR WAM MCHC: 32.4 g/dL (ref 31.0–36.0)
BKR WAM MCV: 98.7 fL (ref 80.0–100.0)
BKR WAM MONOCYTE ABSOLUTE COUNT.: 0.51 x 1000/ÂµL (ref 0.00–1.00)
BKR WAM MONOCYTES: 8 % (ref 4.0–12.0)
BKR WAM MPV: 8.6 fL (ref 8.0–12.0)
BKR WAM NEUTROPHILS: 81.6 % — ABNORMAL HIGH (ref 39.0–72.0)
BKR WAM NUCLEATED RED BLOOD CELLS: 0 % (ref 0.0–1.0)
BKR WAM PLATELETS: 298 x1000/ÂµL (ref 150–420)
BKR WAM RDW-CV: 12.4 % (ref 11.0–15.0)
BKR WAM RED BLOOD CELL COUNT.: 4.72 M/ÂµL (ref 4.00–6.00)
BKR WAM WHITE BLOOD CELL COUNT: 6.4 x1000/ÂµL (ref 4.0–11.0)

## 2023-01-13 LAB — COMPREHENSIVE METABOLIC PANEL
BKR A/G RATIO: 1.3 (ref 1.0–2.2)
BKR ALANINE AMINOTRANSFERASE (ALT): 12 U/L (ref 10–35)
BKR ALBUMIN: 4.7 g/dL (ref 3.6–5.1)
BKR ALKALINE PHOSPHATASE: 89 U/L (ref 9–122)
BKR ANION GAP: 11 (ref 7–17)
BKR ASPARTATE AMINOTRANSFERASE (AST): 23 U/L (ref 10–35)
BKR AST/ALT RATIO: 1.9
BKR BILIRUBIN TOTAL: 0.3 mg/dL (ref <=1.2)
BKR BLOOD UREA NITROGEN: 19 mg/dL (ref 8–23)
BKR BUN / CREAT RATIO: 37.3 — ABNORMAL HIGH (ref 8.0–23.0)
BKR CALCIUM: 9.8 mg/dL (ref 8.8–10.2)
BKR CHLORIDE: 101 mmol/L (ref 98–107)
BKR CO2: 28 mmol/L (ref 20–30)
BKR CREATININE: 0.51 mg/dL (ref 0.40–1.30)
BKR EGFR, CREATININE (CKD-EPI 2021): >60 mL/min/{1.73_m2} (ref >=60)
BKR GLOBULIN: 3.6 g/dL (ref 2.0–3.9)
BKR GLUCOSE: 81 mg/dL (ref 70–100)
BKR POTASSIUM: 4.3 mmol/L (ref 3.3–5.3)
BKR PROTEIN TOTAL: 8.3 g/dL (ref 5.9–8.3)
BKR SODIUM: 140 mmol/L (ref 136–144)

## 2023-01-13 MED ORDER — DIPHENHYDRAMINE 50 MG/ML INJECTION (WRAPPED E-RX)
50 mg/mL | Freq: Once | INTRAVENOUS | Status: DC | PRN
Start: 2023-01-13 — End: 2023-01-13

## 2023-01-13 MED ORDER — HYDROCORTISONE SOD SUCCINATE (PF) 100 MG/2 ML SOLUTION FOR INJECTION
1002 mg/2 mL | Freq: Once | INTRAVENOUS | Status: DC | PRN
Start: 2023-01-13 — End: 2023-01-13

## 2023-01-13 MED ORDER — EPINEPHRINE 0.3 MG/0.3 ML INJECTION, AUTO-INJECTOR
0.30.3 mg/ mL | INTRAMUSCULAR | Status: DC | PRN
Start: 2023-01-13 — End: 2023-01-13

## 2023-01-13 MED ORDER — FAMOTIDINE 4 MG/ML IN 0.9% SODIUM CHLORIDE (ADULT)
Freq: Once | INTRAVENOUS | Status: DC | PRN
Start: 2023-01-13 — End: 2023-01-13

## 2023-01-13 MED ORDER — SODIUM CHLORIDE 0.9 % BOLUS (NEW BAG)
0.9 % | Freq: Once | INTRAVENOUS | Status: DC | PRN
Start: 2023-01-13 — End: 2023-01-13

## 2023-01-13 MED ORDER — ALBUTEROL SULFATE 2.5 MG/3 ML (0.083 %) SOLUTION FOR NEBULIZATION
2.530.083 mg /3 mL (0.083 %) | RESPIRATORY_TRACT | Status: DC | PRN
Start: 2023-01-13 — End: 2023-01-13

## 2023-01-13 MED ORDER — DURVALUMAB INFUSION
Freq: Once | INTRAVENOUS | Status: CP
Start: 2023-01-13 — End: ?
  Administered 2023-01-13: 17:00:00 250.000 mL/h via INTRAVENOUS

## 2023-01-13 NOTE — Progress Notes
 Pt alert and oriented x3. Pt arrived to clinic for C2D1. Pt called from waiting room to have PIV placed and labs drawn. Pt raising her voice stating I hate this, I don't want to do this, I am tired of all this, I am being forced to do all this. This nurse verbalized to pt, she has the right to say no to PIV placement, lab draw and treatment. Pt's husband [name], encouraging pt to relax and take deep breaths. Pt stated do you know what you are doing? You have one try if not I am leaving. Active listening and emotional support provided to pt. Pt reassured I will place PIV and draw blood, but pt encouraged not to move arm to prevent a missed venous stick and to prevent this nurse from getting a needle stick. Pt stated  I don't give a F --- if you get hurt and don't tell me what to do. This nurse asked pt if she wanted to decline PIV placement with lab draw and pt's husband stated just hold my hand [name], and shut up and let the nurse do her job.Placed tourniquet on pt's Right arm and started to wipe pt's arm and pt began screaming and yelling. Pt pulled her arm away and began swinging her arm. Encouraged pt to take deep breaths and try to relax, PIV successfully placed via RFA, labs drawn and sent for processing. PIV wrapped with gauze and coban. Pt d/c to Provider visit where pt stated yeah now I have to wait another hour to be seen.This nurse made [employer] RN, Dot Lanes APSM and Irving Burton APRN aware of all the above.Pt was seen by Dr. Maxcine Ham, labs reviewed and pt cleared for treatment. Upon arrival to treatment pod, pt declined to answer any questions for nursing assessment, therefore assessment not completed. Pt and husband arguing at chairside, husband walked away. Pt tolerated Durvalumab infusion over 1 hour without adverse reactions. PIV flushed and removed, clean dry dsg applied. Pt d/c in stable condition.

## 2023-01-20 ENCOUNTER — Encounter: Payer: Self-pay | Admitting: *Deleted

## 2023-01-20 NOTE — Progress Notes (Signed)
Notified via fax by [facility] that patient has missed #2 visits.

## 2023-01-21 ENCOUNTER — Encounter: Payer: Self-pay | Admitting: Physician Assistant

## 2023-01-22 NOTE — Progress Notes
 CURRENT THERAPY: C1D1 Durvalumab given 9/20/24INITIAL STUDIES:06/25/2022 [place] A/P showed findings concerning for colitis predominant involvement of the right colon and cecum.07/01/2022 [place] chest showed RIGHT upper lobe mass with chest wall invasion. Mass measures 5.1 x 3.3 x 4.9 cm. This mass erodes into the posterior chest wall with adjacent erosion of the RIGHT posterior fifth and sixth ribs.07/06/2022 FEV1 37% predicted, FEV1/FVC 35%, DLCO 20%. Severe obstruction, severely impaired diffusing capacity.07/09/2022 MRI brain with no evidence of metastatic disease to the brain.07/16/2022 FDG PET/[place] showed intensely hypermetabolic RIGHT upper lobe pleural based lung mass with chest wall and right 5-6th ribs invasion. Biapical hypermetabolic scarring with more prominent on the LEFT apical nodularity, nonspecific but concerning for malignant involvement.07/21/22 Path penAccession #: [id number]        1) LYMPH NODE, 11L, FINE NEEDLE ASPIRATION/WASH:           - NEGATIVE FOR MALIGNANT CELLS      - POLYMORPHOUS POPULATION OF LYMPHOCYTES. 2) LYMPH NODE, STATION 7, FINE NEEDLE ASPIRATION/WASH:           - NEGATIVE FOR MALIGNANT CELLS      - POLYMORPHOUS POPULATION OF LYMPHOCYTES. 3) LYMPH NODE, 11R, FINE NEEDLE ASPIRATION/WASH:           - NEGATIVE FOR MALIGNANT CELLS      - POLYMORPHOUS POPULATION OF LYMPHOCYTES. PAST MEDICAL/ SURGICAL HISTORY:  Colitis, COPD (chronic obstructive pulmonary disease), osteoporosis. Left wrist surgery. 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 Continues to smoke with roughly 40 pack year history.FAMILY HISTORY:  Father, lung cancer; brother, lymphomaMEDICATIONS:  Medication list reviewedALLERGIES:  PCNREVIEW OF SYSTEMS:  As described below and in chart.  Remaining review of systems is negative.Courtney Knox appears well. Maintains an ECOG performance status of two. Multiple somatic complaints but poor historian. Accompanied by her husband today. No clear or worsening symptoms after initiating immunotherapy. Blood work reviewed. Agreement reached to continue this with next dose of durvalumab today.

## 2023-01-23 ENCOUNTER — Inpatient Hospital Stay: Payer: Medicare Other | Admitting: Nurse Practitioner

## 2023-01-23 ENCOUNTER — Inpatient Hospital Stay: Payer: Medicare Other

## 2023-01-23 ENCOUNTER — Other Ambulatory Visit (HOSPITAL_BASED_OUTPATIENT_CLINIC_OR_DEPARTMENT_OTHER): Payer: Self-pay

## 2023-01-23 ENCOUNTER — Encounter: Payer: Self-pay | Admitting: Nurse Practitioner

## 2023-01-23 VITALS — BP 95/63 | HR 106 | Temp 97.9°F | Resp 15 | Ht 67.0 in | Wt 120.9 lb

## 2023-01-23 VITALS — BP 97/65 | HR 106 | Resp 18

## 2023-01-23 DIAGNOSIS — C210 Malignant neoplasm of anus, unspecified: Secondary | ICD-10-CM | POA: Diagnosis not present

## 2023-01-23 DIAGNOSIS — Z5112 Encounter for antineoplastic immunotherapy: Secondary | ICD-10-CM | POA: Diagnosis not present

## 2023-01-23 LAB — CBC WITH DIFFERENTIAL (CANCER CENTER ONLY)
Abs Immature Granulocytes: 0.09 10*3/uL — ABNORMAL HIGH (ref 0.00–0.07)
Basophils Absolute: 0 10*3/uL (ref 0.0–0.1)
Basophils Relative: 0 %
Eosinophils Absolute: 0 10*3/uL (ref 0.0–0.5)
Eosinophils Relative: 0 %
HCT: 26.7 % — ABNORMAL LOW (ref 36.0–46.0)
Hemoglobin: 8.4 g/dL — ABNORMAL LOW (ref 12.0–15.0)
Immature Granulocytes: 1 %
Lymphocytes Relative: 4 %
Lymphs Abs: 0.4 10*3/uL — ABNORMAL LOW (ref 0.7–4.0)
MCH: 30.8 pg (ref 26.0–34.0)
MCHC: 31.5 g/dL (ref 30.0–36.0)
MCV: 97.8 fL (ref 80.0–100.0)
Monocytes Absolute: 0.7 10*3/uL (ref 0.1–1.0)
Monocytes Relative: 6 %
Neutro Abs: 9.9 10*3/uL — ABNORMAL HIGH (ref 1.7–7.7)
Neutrophils Relative %: 89 %
Platelet Count: 347 10*3/uL (ref 150–400)
RBC: 2.73 MIL/uL — ABNORMAL LOW (ref 3.87–5.11)
RDW: 15.3 % (ref 11.5–15.5)
WBC Count: 11.2 10*3/uL — ABNORMAL HIGH (ref 4.0–10.5)
nRBC: 0 % (ref 0.0–0.2)

## 2023-01-23 LAB — CMP (CANCER CENTER ONLY)
ALT: 8 U/L (ref 0–44)
AST: 13 U/L — ABNORMAL LOW (ref 15–41)
Albumin: 3.4 g/dL — ABNORMAL LOW (ref 3.5–5.0)
Alkaline Phosphatase: 88 U/L (ref 38–126)
Anion gap: 9 (ref 5–15)
BUN: 16 mg/dL (ref 8–23)
CO2: 28 mmol/L (ref 22–32)
Calcium: 8.8 mg/dL — ABNORMAL LOW (ref 8.9–10.3)
Chloride: 97 mmol/L — ABNORMAL LOW (ref 98–111)
Creatinine: 0.57 mg/dL (ref 0.44–1.00)
GFR, Estimated: >60 mL/min (ref >60)
Glucose, Bld: 131 mg/dL — ABNORMAL HIGH (ref 70–99)
Potassium: 3.7 mmol/L (ref 3.5–5.1)
Sodium: 134 mmol/L — ABNORMAL LOW (ref 135–145)
Total Bilirubin: 0.7 mg/dL (ref 0.3–1.2)
Total Protein: 6 g/dL — ABNORMAL LOW (ref 6.5–8.1)

## 2023-01-23 MED ORDER — HEPARIN SOD (PORK) LOCK FLUSH 100 UNIT/ML IV SOLN
500.0000 [IU] | Freq: Once | INTRAVENOUS | Status: AC | PRN
  Administered 2023-01-23: 500 [IU]

## 2023-01-23 MED ORDER — SODIUM CHLORIDE 0.9 % IV SOLN: Freq: Once | INTRAVENOUS | Status: AC

## 2023-01-23 MED ORDER — SODIUM CHLORIDE 0.9 % IV SOLN
240.0000 mg | Freq: Once | INTRAVENOUS | Status: AC
  Administered 2023-01-23: 240 mg via INTRAVENOUS
  Filled 2023-01-23: qty 24

## 2023-01-23 MED ORDER — HYDROMORPHONE HCL 2 MG PO TABS
2.0000 mg | ORAL_TABLET | ORAL | 0 refills | Status: DC | PRN
  Filled 2023-01-23: qty 60, 8d supply, fill #0

## 2023-01-23 MED ORDER — SODIUM CHLORIDE 0.9% FLUSH
10.0000 mL | INTRAVENOUS | Status: DC | PRN
  Administered 2023-01-23: 10 mL

## 2023-01-23 NOTE — Progress Notes (Addendum)
Patient seen by Lonna Cobb NP today  Vitals are within treatment parameters:No (Please specify and give further instructions.) HR 106 It is ok to proceed.  Labs are within treatment parameters: Yes   Treatment plan has been signed: Yes   Per physician team, Patient is ready for treatment and there are NO modifications to the treatment plan.

## 2023-01-23 NOTE — Telephone Encounter (Signed)
Please see pt message and advise 

## 2023-01-23 NOTE — Progress Notes (Signed)
Alyssa Deleon OFFICE PROGRESS NOTE   Diagnosis: Anal cancer  INTERVAL HISTORY:   Alyssa Deleon returns as scheduled.  She completed cycle 3 nivolumab 01/09/2023.  She denies significant diarrhea.  She is noticing some normal bowel movements.  She continues to see blood on the toilet tissue after a bowel movement.  Appetite is better.  She takes 2 Dilaudid at bedtime and is able to sleep through the night.  She occasionally takes 1 during the day.  Objective:  Vital signs in last 24 hours:  Blood pressure 95/63, pulse (!) 106, temperature 97.9 F (36.6 C), temperature source Temporal, resp. rate 15, height 5\' 7"  (1.702 m), weight 120 lb 14.4 oz (54.8 kg), SpO2 97%.    HEENT: No thrush or ulcers. Resp: Lungs clear bilaterally. Cardio: Regular rate and rhythm. GI: No hepatosplenomegaly.  Firm rind like lesion right anal verge. Vascular: Minimal edema right lower leg. Skin: Persistent superficial skin breakdown at the left trochanter. Cath without erythema.  Lab Results:  Lab Results  Component Value Date   WBC 11.2 (H) 01/23/2023   HGB 8.4 (L) 01/23/2023   HCT 26.7 (L) 01/23/2023   MCV 97.8 01/23/2023   PLT 347 01/23/2023   NEUTROABS 9.9 (H) 01/23/2023    Imaging:  No results found.  Medications: I have reviewed the patient's current medications.  Assessment/Plan: Anal cancer Anal mass noted on digital exam and colonoscopy 05/09/2022-biopsy consistent with squamous cell carcinoma CTs 05/12/2022-no evidence of thoracic metastatic disease, calcified right pleural plaques, asymmetric thickening of the right posterior wall of the distal rectum/anal canal, no evidence of metastatic adenopathy in the abdomen or pelvis, 2 small hypodense left liver lesions favored to represent benign cysts PET scan 05/30/2022-intense hypermetabolic activity at the anus.  No evidence of metastatic adenopathy.  Indeterminate small, mildly hypermetabolic lesion in the lateral segment of the  left hepatic lobe. Radiation 06/06/2022-07/19/2022 Cycle one 5-FU/Mitomycin-C 06/06/2022 cycle two 5-FU/Mitomycin-C 07/04/2022 (5-FU dose reduced secondary to diarrhea) MRI liver 06/15/2022-1 cm hypervascular mass in segment 2 of the left hepatic lobe, corresponds to the focus of hypermetabolism on recent PET-CT.  Imaging characteristics atypical for metastatic squamous cell carcinoma.  Follow-up MRI in 3 months Cycle two 5-FU/Mitomycin-C 07/04/2022 (5-FU dose reduced secondary to diarrhea) 08/29/2022 anoscopy, Dr. Maisie Fus, smaller posterior midline rectal mass approximately in the dentate line MRI liver 09/12/2022-enlargement of a complex cystic and solid-appearing enhancing lesion in segment 2 of the liver, 4 x 2.2 cm compared to 1.5 cm on previous study PET scan 09/23/2022-interval increase in size and tracer avidity of a solitary metastasis within the lateral segment of left hepatic lobe.  Tracer avid lesion involving the anus again noted, slightly decreased in size and tracer avidity. 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 Modesta Messing 10/10/2022, presented at multidisciplinary clinic-recommendation for 3 months of treatment with systemic therapy to address the liver followed by restaging scan and also MRI of the pelvis to assess the primary site  Cycle 1 Taxol/carboplatin 11/03/2022 12/07/2022 chemotherapy discontinued due to poor performance status Cycle 1 nivolumab 12/12/2022 Cycle 2 nivolumab 12/26/2022 Cycle 3 nivolumab 01/09/2023 Cycle 4 nivolumab 01/23/2023 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 Syncopal  episode 11/19/2022-question if due to dehydration Anemia secondary to chronic disease and rectal bleeding  Disposition: [name] appears stable.  She has completed 3 cycles of nivolumab.  Performance status is better.  She is having some "normal" bowel movements.  She is tolerating nivolumab well.  Plan to proceed with cycle 4 today as scheduled.  Restaging CTs after 5 or 6 treatments.  CBC and chemistry panel reviewed.  Labs adequate to proceed as above.  She will return for follow up and treatment in 2 weeks.  We are available to see her sooner if needed.  Alyssa Deleon ANP/GNP-BC   01/23/2023  12:35 PM

## 2023-01-23 NOTE — Patient Instructions (Signed)
[facility]   Discharge Instructions: Thank you for choosing [facility] to provide your oncology and hematology care.   If you have a lab appointment with the [facility], please go directly to the [facility] and check in at the registration area.   Wear comfortable clothing and clothing appropriate for easy access to any Portacath or PICC line.   We strive to give you quality time with your provider. You may need to reschedule your appointment if you arrive late (15 or more minutes).  Arriving late affects you and other patients whose appointments are after yours.  Also, if you miss three or more appointments without notifying the office, you may be dismissed from the clinic at the provider's discretion.      For prescription refill requests, have your pharmacy contact our office and allow 72 hours for refills to be completed.    Today you received the following chemotherapy and/or immunotherapy agents Nivolumab (OPDIVO).      To help prevent nausea and vomiting after your treatment, we encourage you to take your nausea medication as directed.  BELOW ARE SYMPTOMS THAT SHOULD BE REPORTED IMMEDIATELY: *FEVER GREATER THAN 100.4 F (38 C) OR HIGHER *CHILLS OR SWEATING *NAUSEA AND VOMITING THAT IS NOT CONTROLLED WITH YOUR NAUSEA MEDICATION *UNUSUAL SHORTNESS OF BREATH *UNUSUAL BRUISING OR BLEEDING *URINARY PROBLEMS (pain or burning when urinating, or frequent urination) *BOWEL PROBLEMS (unusual diarrhea, constipation, pain near the anus) TENDERNESS IN MOUTH AND THROAT WITH OR WITHOUT PRESENCE OF ULCERS (sore throat, sores in mouth, or a toothache) UNUSUAL RASH, SWELLING OR PAIN  UNUSUAL VAGINAL DISCHARGE OR ITCHING   Items with * indicate a potential emergency and should be followed up as soon as possible or go to the Emergency Department if any problems should occur.  Please show the CHEMOTHERAPY ALERT CARD or IMMUNOTHERAPY ALERT CARD  at check-in to the Emergency Department and triage nurse.  Should you have questions after your visit or need to cancel or reschedule your appointment, please contact [facility]  Dept: [phone]  and follow the prompts.  Office hours are 8:00 a.m. to 4:30 p.m. Monday - Friday. Please note that voicemails left after 4:00 p.m. may not be returned until the following business day.  We are closed weekends and major holidays. You have access to a nurse at all times for urgent questions. Please call the main number to the clinic Dept: [phone] and follow the prompts.   For any non-urgent questions, you may also contact your provider using MyChart. We now offer e-Visits for anyone 28 and older to request care online for non-urgent symptoms. For details visit [email or web address].   Also download the MyChart app! Go to the app store, search "MyChart", open the app, select [address], and log in with your MyChart username and password.  Nivolumab Injection What is this medication? NIVOLUMAB (nye VOL ue mab) treats some types of cancer. It works by helping your immune system slow or stop the spread of cancer cells. It is a monoclonal antibody. This medicine may be used for other purposes; ask your health care provider or pharmacist if you have questions. COMMON BRAND NAME(S): Opdivo What should I tell my care team before I take this medication? They need to know if you have any of these conditions: Allogeneic stem cell transplant (uses someone else's stem cells) Autoimmune diseases, such as Crohn disease, ulcerative colitis, lupus History of chest radiation Nervous system  as Guillain-Barre syndrome or myasthenia gravis Organ transplant An unusual or allergic reaction to nivolumab, other medications, foods, dyes, or preservatives Pregnant or trying to get pregnant Breast-feeding How should I use this medication? This medication is infused  into a vein. It is given in a hospital or clinic setting. A special MedGuide will be given to you before each treatment. Be sure to read this information carefully each time. Talk to your care team about the use of this medication in children. While it may be prescribed for children as young as 12 years for selected conditions, precautions do apply. Overdosage: If you think you have taken too much of this medicine contact a poison control center or emergency room at once. NOTE: This medicine is only for you. Do not share this medicine with others. What if I miss a dose? Keep appointments for follow-up doses. It is important not to miss your dose. Call your care team if you are unable to keep an appointment. What may interact with this medication? Interactions have not been studied. This list may not describe all possible interactions. Give your health care provider a list of all the medicines, herbs, non-prescription drugs, or dietary supplements you use. Also tell them if you smoke, drink alcohol, or use illegal drugs. Some items may interact with your medicine. What should I watch for while using this medication? Your condition will be monitored carefully while you are receiving this medication. You may need blood work while taking this medication. This medication may cause serious skin reactions. They can happen weeks to months after starting the medication. Contact your care team right away if you notice fevers or flu-like symptoms with a rash. The rash may be red or purple and then turn into blisters or peeling of the skin. You may also notice a red rash with swelling of the face, lips, or lymph nodes in your neck or under your arms. Tell your care team right away if you have any change in your eyesight. Talk to your care team if you are pregnant or think you might be pregnant. A negative pregnancy test is required before starting this medication. A reliable form of contraception is recommended  while taking this medication and for 5 months after the last dose. Talk to your care team about effective forms of contraception. Do not breast-feed while taking this medication and for 5 months after the last dose. What side effects may I notice from receiving this medication? Side effects that you should report to your care team as soon as possible: Allergic reactions--skin rash, itching, hives, swelling of the face, lips, tongue, or throat Dry cough, shortness of breath or trouble breathing Eye pain, redness, irritation, or discharge with blurry or decreased vision Heart muscle inflammation--unusual weakness or fatigue, shortness of breath, chest pain, fast or irregular heartbeat, dizziness, swelling of the ankles, feet, or hands Hormone gland problems--headache, sensitivity to light, unusual weakness or fatigue, dizziness, fast or irregular heartbeat, increased sensitivity to cold or heat, excessive sweating, constipation, hair loss, increased thirst or amount of urine, tremors or shaking, irritability Infusion reactions--chest pain, shortness of breath or trouble breathing, feeling faint or lightheaded Kidney injury (glomerulonephritis)--decrease in the amount of urine, red or dark brown urine, foamy or bubbly urine, swelling of the ankles, hands, or feet Liver injury--right upper belly pain, loss of appetite, nausea, light-colored stool, dark yellow or brown urine, yellowing skin or eyes, unusual weakness or fatigue Pain, tingling, or numbness in the hands or feet, muscle weakness,   muscle weakness, change in vision, confusion or trouble speaking, loss of balance or coordination, trouble walking, seizures Rash, fever, and swollen lymph nodes Redness, blistering, peeling, or loosening of the skin, including inside the mouth Sudden or severe stomach pain, bloody diarrhea, fever, nausea, vomiting Side effects that usually do not require medical attention (report these to your care team if they continue or are  bothersome): Bone, joint, or muscle pain Diarrhea Fatigue Loss of appetite Nausea Skin rash This list may not describe all possible side effects. Call your doctor for medical advice about side effects. You may report side effects to FDA at 1-800-FDA-1088. Where should I keep my medication? This medication is given in a hospital or clinic. It will not be stored at home. NOTE: This sheet is a summary. It may not cover all possible information. If you have questions about this medicine, talk to your doctor, pharmacist, or health care provider.  2024 Elsevier/Gold Standard (2021-07-12 00:00:00)

## 2023-01-24 ENCOUNTER — Telehealth: Payer: Self-pay

## 2023-01-24 NOTE — Telephone Encounter (Signed)
Alyssa Deleon., P.T. from [facility] has informed us that the patient has been discharged from physical therapy. She mentioned that she tried to  attempting to schedule a session with the patient but has not responded to several messages and texts she has sent.

## 2023-01-27 ENCOUNTER — Ambulatory Visit: Admit: 2023-01-27 | Payer: BLUE CROSS/BLUE SHIELD | Primary: Internal Medicine

## 2023-01-27 ENCOUNTER — Inpatient Hospital Stay: Admit: 2023-01-27 | Discharge: 2023-01-27 | Payer: BLUE CROSS/BLUE SHIELD | Primary: Internal Medicine

## 2023-01-27 ENCOUNTER — Encounter: Admit: 2023-01-27 | Payer: PRIVATE HEALTH INSURANCE | Attending: Adult Health | Primary: Internal Medicine

## 2023-01-27 ENCOUNTER — Ambulatory Visit: Admit: 2023-01-27 | Payer: BLUE CROSS/BLUE SHIELD | Attending: Adult Health | Primary: Internal Medicine

## 2023-01-27 VITALS — BP 106/68 | HR 79 | Temp 98.70000°F | Resp 18 | Wt 97.2 lb

## 2023-01-27 DIAGNOSIS — C3411 Malignant neoplasm of upper lobe, right bronchus or lung: Secondary | ICD-10-CM

## 2023-01-27 DIAGNOSIS — Z923 Personal history of irradiation: Secondary | ICD-10-CM

## 2023-01-27 DIAGNOSIS — R300 Dysuria: Secondary | ICD-10-CM

## 2023-01-27 DIAGNOSIS — Z5112 Encounter for antineoplastic immunotherapy: Secondary | ICD-10-CM

## 2023-01-27 DIAGNOSIS — F419 Anxiety disorder, unspecified: Secondary | ICD-10-CM

## 2023-01-27 DIAGNOSIS — C3491 Malignant neoplasm of unspecified part of right bronchus or lung: Secondary | ICD-10-CM

## 2023-01-27 DIAGNOSIS — F32A Depression: Secondary | ICD-10-CM

## 2023-01-27 DIAGNOSIS — Z791 Long term (current) use of non-steroidal anti-inflammatories (NSAID): Secondary | ICD-10-CM

## 2023-01-27 DIAGNOSIS — Z79620 Long term (current) use of immunosuppressive biologic: Secondary | ICD-10-CM

## 2023-01-27 DIAGNOSIS — Z880 Allergy status to penicillin: Secondary | ICD-10-CM

## 2023-01-27 DIAGNOSIS — C3490 Malignant neoplasm of unspecified part of unspecified bronchus or lung: Secondary | ICD-10-CM

## 2023-01-27 DIAGNOSIS — Z79899 Other long term (current) drug therapy: Secondary | ICD-10-CM

## 2023-01-27 DIAGNOSIS — R053 Chronic cough: Secondary | ICD-10-CM

## 2023-01-27 DIAGNOSIS — R413 Other amnesia: Secondary | ICD-10-CM

## 2023-01-27 DIAGNOSIS — J449 Chronic obstructive pulmonary disease, unspecified: Secondary | ICD-10-CM

## 2023-01-27 DIAGNOSIS — Z801 Family history of malignant neoplasm of trachea, bronchus and lung: Secondary | ICD-10-CM

## 2023-01-27 DIAGNOSIS — Z807 Family history of other malignant neoplasms of lymphoid, hematopoietic and related tissues: Secondary | ICD-10-CM

## 2023-01-27 DIAGNOSIS — B1920 Unspecified viral hepatitis C without hepatic coma: Secondary | ICD-10-CM

## 2023-01-27 DIAGNOSIS — K5900 Constipation, unspecified: Secondary | ICD-10-CM

## 2023-01-27 DIAGNOSIS — F17210 Nicotine dependence, cigarettes, uncomplicated: Secondary | ICD-10-CM

## 2023-01-27 DIAGNOSIS — Z720 Tobacco use: Secondary | ICD-10-CM

## 2023-01-27 LAB — COMPREHENSIVE METABOLIC PANEL
BKR A/G RATIO: 1.3 (ref 1.0–2.2)
BKR ALANINE AMINOTRANSFERASE (ALT): 10 U/L (ref 10–35)
BKR ALBUMIN: 4.5 g/dL (ref 3.6–5.1)
BKR ALKALINE PHOSPHATASE: 90 U/L (ref 9–122)
BKR ANION GAP: 12 (ref 7–17)
BKR ASPARTATE AMINOTRANSFERASE (AST): 20 U/L (ref 10–35)
BKR AST/ALT RATIO: 2
BKR BILIRUBIN TOTAL: 0.4 mg/dL (ref <=1.2)
BKR BLOOD UREA NITROGEN: 17 mg/dL (ref 8–23)
BKR BUN / CREAT RATIO: 32.7 — ABNORMAL HIGH (ref 8.0–23.0)
BKR CALCIUM: 9.8 mg/dL (ref 8.8–10.2)
BKR CHLORIDE: 99 mmol/L (ref 98–107)
BKR CO2: 28 mmol/L (ref 20–30)
BKR CREATININE: 0.52 mg/dL (ref 0.40–1.30)
BKR EGFR, CREATININE (CKD-EPI 2021): >60 mL/min/{1.73_m2} (ref >=60)
BKR GLOBULIN: 3.6 g/dL (ref 2.0–3.9)
BKR GLUCOSE: 88 mg/dL (ref 70–100)
BKR POTASSIUM: 4 mmol/L (ref 3.3–5.3)
BKR PROTEIN TOTAL: 8.1 g/dL (ref 5.9–8.3)
BKR SODIUM: 139 mmol/L (ref 136–144)

## 2023-01-27 LAB — CBC WITH AUTO DIFFERENTIAL
BKR WAM ABSOLUTE IMMATURE GRANULOCYTES.: 0.03 x 1000/ÂµL (ref 0.00–0.30)
BKR WAM ABSOLUTE LYMPHOCYTE COUNT.: 0.74 x 1000/ÂµL (ref 0.60–3.70)
BKR WAM ABSOLUTE NRBC (2 DEC): 0 x 1000/ÂµL (ref 0.00–1.00)
BKR WAM ANC (ABSOLUTE NEUTROPHIL COUNT): 5.83 x 1000/ÂµL (ref 2.00–7.60)
BKR WAM BASOPHIL ABSOLUTE COUNT.: 0.03 x 1000/ÂµL (ref 0.00–1.00)
BKR WAM BASOPHILS: 0.4 % (ref 0.0–1.4)
BKR WAM EOSINOPHIL ABSOLUTE COUNT.: 0.19 x 1000/ÂµL (ref 0.00–1.00)
BKR WAM EOSINOPHILS: 2.5 % (ref 0.0–5.0)
BKR WAM HEMATOCRIT (2 DEC): 47.6 % — ABNORMAL HIGH (ref 35.00–45.00)
BKR WAM HEMOGLOBIN: 15.3 g/dL (ref 11.7–15.5)
BKR WAM IMMATURE GRANULOCYTES: 0.4 % (ref 0.0–1.0)
BKR WAM LYMPHOCYTES: 9.9 % — ABNORMAL LOW (ref 17.0–50.0)
BKR WAM MCH (PG): 31.1 pg (ref 27.0–33.0)
BKR WAM MCHC: 32.1 g/dL (ref 31.0–36.0)
BKR WAM MCV: 96.7 fL (ref 80.0–100.0)
BKR WAM MONOCYTE ABSOLUTE COUNT.: 0.69 x 1000/ÂµL (ref 0.00–1.00)
BKR WAM MONOCYTES: 9.2 % (ref 4.0–12.0)
BKR WAM MPV: 8.8 fL (ref 8.0–12.0)
BKR WAM NEUTROPHILS: 77.6 % — ABNORMAL HIGH (ref 39.0–72.0)
BKR WAM NUCLEATED RED BLOOD CELLS: 0 % (ref 0.0–1.0)
BKR WAM PLATELETS: 248 x1000/ÂµL (ref 150–420)
BKR WAM RDW-CV: 12.5 % (ref 11.0–15.0)
BKR WAM RED BLOOD CELL COUNT.: 4.92 M/ÂµL (ref 4.00–6.00)
BKR WAM WHITE BLOOD CELL COUNT: 7.5 x1000/ÂµL (ref 4.0–11.0)

## 2023-01-27 MED ORDER — DIPHENHYDRAMINE 50 MG/ML INJECTION (WRAPPED E-RX)
50 | Freq: Once | INTRAVENOUS | Status: DC | PRN
Start: 2023-01-27 — End: 2023-01-28

## 2023-01-27 MED ORDER — ALBUTEROL SULFATE 2.5 MG/3 ML (0.083 %) SOLUTION FOR NEBULIZATION
2.5 | RESPIRATORY_TRACT | Status: DC | PRN
Start: 2023-01-27 — End: 2023-01-28

## 2023-01-27 MED ORDER — FAMOTIDINE 4 MG/ML IN 0.9% SODIUM CHLORIDE (ADULT)
Freq: Once | INTRAVENOUS | Status: DC | PRN
Start: 2023-01-27 — End: 2023-01-28

## 2023-01-27 MED ORDER — HYDROCORTISONE SOD SUCCINATE (PF) 100 MG/2 ML SOLUTION FOR INJECTION
100 | Freq: Once | INTRAVENOUS | Status: DC | PRN
Start: 2023-01-27 — End: 2023-01-28

## 2023-01-27 MED ORDER — EPINEPHRINE 0.3 MG/0.3 ML INJECTION, AUTO-INJECTOR
0.3 | INTRAMUSCULAR | Status: DC | PRN
Start: 2023-01-27 — End: 2023-01-28

## 2023-01-27 MED ORDER — SODIUM CHLORIDE 0.9 % BOLUS (NEW BAG)
0.9 | Freq: Once | INTRAVENOUS | Status: DC | PRN
Start: 2023-01-27 — End: 2023-01-28

## 2023-01-27 MED ORDER — DURVALUMAB INFUSION
Freq: Once | INTRAVENOUS | Status: CP
Start: 2023-01-27 — End: ?
  Administered 2023-01-27: 20:00:00 250.000 mL/h via INTRAVENOUS

## 2023-01-27 NOTE — Progress Notes
 Pt arrived for D15C2 Durvalumab.  PIV placed and labs drawn and sent for processing.Seen by Irving Burton, APRN and cleared for treatment.Assessment completed and charted.  Pt states she is in pain all over her body.  Emily aware.  U/A ordered.PIV flushed with brisk blood return noted.Durvalumab infused over 60 minutes without incident.Pt complained that she would have to take all her clothes off in order to void and give the urine sample and then she'd need a new peripad.  Pt was told we can provide her with a clean peripad but pt stated they are not like hers and she didn't want ours.  Pt was able to give urine sample after infusion and specimen sent for processing.PIV flushed and removed, dry dsg applied to site.Pt discharged home in stable condition, ambulatory, with husband.

## 2023-01-27 NOTE — Progress Notes
 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  Is 67 y.o. female active smoker (~50 pack-years) with a history of COPD and a now biopsy-proven NSCLC (keratinizing SCC) of the RUL with invasion of the posterior chest wall and ribs 5-6 (PD-L1 TPS 5%, at least cT3N0). She additionally has lesion in the LEFT lung currently under surveillance. 06/25/2022 Miguel Barrera A/P showed findings concerning for colitis predominant involvement of the right colon and cecum. 07/01/2022 [facility] chest showed RIGHT upper lobe mass with chest wall invasion. Mass measures 5.1 x 3.3 x 4.9 cm. This mass erodes into the posterior chest wall with adjacent erosion of the RIGHT posterior fifth and sixth ribs. 07/06/2022 FEV1 37% predicted, FEV1/FVC 35%, DLCO 20%. Severe obstruction, severely impaired diffusing capacity. 07/09/2022 MRI brain with no evidence of metastatic disease to the brain. 07/16/2022 FDG PET/[place] showed intensely hypermetabolic RIGHT upper lobe pleural based lung mass with chest wall and right 5-6th ribs invasion. Biapical hypermetabolic scarring with more prominent on the LEFT apical nodularity, nonspecific but concerning for malignant involvement.She met with Dr Maxcine Ham (med-onc) and Dr Valora Corporal (Rad onc) - decision reached to proceed with concurrent chemo/RTPRIOR THERAPY: C1D1 carbo/taxol weekly w RT on 6/11/24Completed RT 8/2/24CURRENT THERAPY: C1D1 Durvalumab given 9/20/24INTERIM HISTORY: Courtney Knox presents with her daughter to continue therapy w durva today. No longer seeing blood in the urine but continues to have abdominal pain (this is not new for her).  Rarely taking the meloxicam for pain that is all over  abdomen, hips, knees and hands.She is not taking zoloft consistently despite reminders from her family. Bowels moving - usually softer stools.Wears depends, feels overwhelmed by having to change everything PO intake decreasedEncouraged ensure to increase calorie intake.Chronic cough persists, no worsening shortness of breath. She denies headaches, dizziness, fevers, chills, rash and neuropathy. REVIEW OF PAST MEDICAL,SURGICAL,SOCIAL,FAMILY HISTORY: reviewed and unchangedShe  has a past medical history of Anxiety, COPD (chronic obstructive pulmonary disease) (HC Code), Depression, HCV (hepatitis C virus), Lung cancer (HC Code), Memory loss, and Tobacco abuse.She has had a Left wrist surgery. 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 Continues to smoke with roughly 40 pack year history. FAMILY HISTORY:  Father, lung cancer; brother, lymphomaReview of SystemsPertinent items are noted in HPI.A 12- point review of systems, was otherwise negative except as stated in the HPI. ECOG 1ALLERGIES: PenicillinMEDICATIONS: Patient has a current medication list which includes the following prescription(s): b complex vitamins (B COMPLEX) tablet - Take 1 tablet by mouth daily (Patient not taking: Reported on 10/28/2022), calcium carbonate/vitamin D3 (CALCIUM WITH VITAMIN D3 ORAL) - Take 1 tablet by mouth daily (Patient not taking: Reported on 10/28/2022), EPCLUSA 400-100 mg per tablet - Take 1 tablet by mouth daily (Patient not taking: Reported on 11/16/2022), famotidine (PEPCID) 20 mg tablet - Take 1 tablet (20 mg total) by mouth daily (Patient not taking: Reported on 11/16/2022), hydrOXYzine (ATARAX) 25 mg tablet - Take 1 tablet (25 mg total) by mouth 3 (three) times daily as needed for itching (Patient not taking: Reported on 11/16/2022), meloxicam (MOBIC) 15 mg tablet - Take 1 tablet (15 mg total) by mouth daily, sertraline (ZOLOFT) 25 mg tablet - Take 1 tablet (25 mg total) by mouth daily, and traMADoL (ULTRAM) 50 mg tablet - Take 1 tablet (50 mg total) by mouth (Patient not taking: Reported on 11/16/2022).PHYSICAL EXAM:BP 106/68  -  Pulse 79  - Temp 98.7 ?F (37.1 ?C) (Temporal)  - Resp 18  - Wt 44.1 kg  - SpO2 98%  - BMI 18.07 kg/m? Temp:  [98.7 ?F (37.1 ?C)] 98.7 ?F (37.1 ?C)Pulse:  [79] 79Resp:  [18] 18BP: (106)/(68) 106/68SpO2:  [98 %] 98 %Physical ExamConstitutional:     Appearance: Normal appearance. HENT:    Head: Normocephalic and atraumatic. Eyes:    Conjunctiva/sclera: Conjunctivae normal. Cardiovascular:    Rate and Rhythm: Normal rate and regular rhythm. Pulmonary:    Effort: Pulmonary effort is normal.    Breath sounds: No wheezing, rhonchi or rales.    Comments: Breath sounds decreased to lung basesAbdominal:    General: Bowel sounds are normal. There is no distension.    Palpations: Abdomen is soft.    Tenderness: There is no abdominal tenderness. Musculoskeletal:       General: Normal range of motion.    Cervical back: Normal range of motion.    Right lower leg: No edema.    Left lower leg: No edema.    Comments: TTP to right upper back Lymphadenopathy:    Cervical: No cervical adenopathy. Skin:   General: Skin is warm and dry.    Findings: No erythema or rash.    Comments: Scrape to left forearm and cut at base of left middle finger. Neurological:    General: No focal deficit present.    Mental Status: She is alert and oriented to person, place, and time. Psychiatric:    Comments: Pressured/tangential speech at times, labile mood Data Review:Lab Results Component Value Date  WBC 7.5 01/27/2023  RBC 4.92 01/27/2023  HGB 15.3 01/27/2023  HCT 47.60 (H) 01/27/2023  MCV 96.7 01/27/2023  MCH 31.1 01/27/2023  MCHC 32.1 01/27/2023  PLT 248 01/27/2023  MPV 8.8 01/27/2023 Lab Results Component Value Date  GLU 88 01/27/2023  BUN 17 01/27/2023  CREATININE 0.52 01/27/2023  NA 139 01/27/2023  K 4.0 01/27/2023  CL 99 01/27/2023  CO2 28 01/27/2023  ALBUMIN 4.5 01/27/2023  PROT 8.1 01/27/2023  BILITOT 0.4 01/27/2023  ALKPHOS 90 01/27/2023  ALT 10 01/27/2023  GLOB 3.6 01/27/2023  CALCIUM 9.8 01/27/2023  IMPRESSION:  Malignant neoplasm of bronchus and lung (HC Code)  (primary encounter diagnosis)Ms Cerritos is a 67 year old woman diagnosed in the spring of 2024 with 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 She did ultimately go to the ED but left before any testing was performed. 10/28/22 - completed concurrent therapy. 12/16/22 - C1D1 durvalumab11/1/24 - Today she presents with her daughter and husband to continue durvalumab. Daughter present for the majority of the visit, very supportive of her mother, trying to help her as much as she can. Courtney Knox is resistant to taking medications, gets overwhelmed easily. Frustrated by feeling poorly, however her labs look good and her vitals are normal, with improvement in her weight. She has tolerated the durva without new symptoms. Practice RN assisted with dressing scrape and cuts to her left arm/hand. Encouraged her to change dressings/bandages daily to prevent infection. Labs, vitals, ROS reviewed, physical exam completed. Patient meets criteria to proceed with treatment today. PLAN: NSCLC - Proceed with C2 D15 durva todayDysuria - send repeat UA/culture today if she will give a specimenConstipation - continue miralax daily as needed. Pain - encouraged her  to start meloxicamPO intake decreased - offered nutrition assistance but she has previously declined. 	- will focus on frequent small meals, maximizing calorie intake. 	- will try to resume ensure dailyLabile mood - again encouraged her to take sertraline more consistently, she is resistant to this. Decreased hearing - will consider referral to ENT/audiology in the future. Patient to call with new or worsening symptoms prior to the next visit. RTC in 2 weeks for next infusion. Scans due in approx 3 months from prior imaging, will book scans in late November. On the day of this patient's encounter, a total of 50 minutes was personally spent by me.  This does not include any resident/fellow teaching time, or any time spent performing a procedural service. Follow up: 11/15 - labs, visit w  and tx x 2 hoursScans - approx 01/2610/29 - labs, visit w dr Maxcine Ham and tx x 2 hours

## 2023-01-28 LAB — URINALYSIS-MACROSCOPIC W/REFLEX MICROSCOPIC
BKR BILIRUBIN, UA: NEGATIVE
BKR BLOOD, UA: NEGATIVE
BKR GLUCOSE, UA: NEGATIVE
BKR KETONES, UA: NEGATIVE
BKR NITRITE, UA: POSITIVE — AB
BKR PH, UA: 5.5 (ref 5.5–7.5)
BKR SPECIFIC GRAVITY, UA: 1.02 (ref 1.005–1.030)
BKR UROBILINOGEN, UA (MG/DL): <2 mg/dL (ref <=2.0)

## 2023-01-28 LAB — URINE MICROSCOPIC     (BH GH LMW YH): BKR WBC/HPF: >30 /HPF — AB (ref 0–5)

## 2023-01-30 NOTE — Telephone Encounter (Signed)
Please see patient response and advise 

## 2023-02-01 ENCOUNTER — Encounter: Payer: Self-pay | Admitting: *Deleted

## 2023-02-01 NOTE — Progress Notes (Signed)
Received notification from [facility]: d/c'd from services due to noncompliance with visits

## 2023-02-04 ENCOUNTER — Other Ambulatory Visit: Payer: Self-pay | Admitting: Oncology

## 2023-02-06 ENCOUNTER — Inpatient Hospital Stay: Payer: Medicare Other | Admitting: Oncology

## 2023-02-06 ENCOUNTER — Other Ambulatory Visit: Payer: Self-pay

## 2023-02-06 ENCOUNTER — Telehealth: Payer: Self-pay | Admitting: Oncology

## 2023-02-06 ENCOUNTER — Inpatient Hospital Stay: Payer: Medicare Other

## 2023-02-06 DIAGNOSIS — J432 Centrilobular emphysema: Secondary | ICD-10-CM

## 2023-02-06 NOTE — Telephone Encounter (Signed)
Order placed again per PCP

## 2023-02-06 NOTE — Telephone Encounter (Signed)
Spoke with patient confirming upcoming appointment  

## 2023-02-08 NOTE — Telephone Encounter (Signed)
Faxed DME order for oxygen and demographics for patient to [facility] in [place]

## 2023-02-09 ENCOUNTER — Inpatient Hospital Stay: Payer: Medicare Other

## 2023-02-09 ENCOUNTER — Other Ambulatory Visit: Payer: Self-pay | Admitting: Nurse Practitioner

## 2023-02-09 ENCOUNTER — Other Ambulatory Visit (HOSPITAL_BASED_OUTPATIENT_CLINIC_OR_DEPARTMENT_OTHER): Payer: Self-pay

## 2023-02-09 ENCOUNTER — Inpatient Hospital Stay: Payer: Medicare Other | Attending: Oncology | Admitting: Oncology

## 2023-02-09 ENCOUNTER — Other Ambulatory Visit: Payer: Self-pay | Admitting: *Deleted

## 2023-02-09 ENCOUNTER — Other Ambulatory Visit: Payer: Medicare Other

## 2023-02-09 ENCOUNTER — Telehealth: Payer: Self-pay | Admitting: Physician Assistant

## 2023-02-09 VITALS — BP 97/42 | HR 89 | Temp 98.2°F | Resp 18

## 2023-02-09 VITALS — BP 86/60 | HR 106 | Temp 97.9°F | Resp 18 | Ht 67.0 in | Wt 115.0 lb

## 2023-02-09 DIAGNOSIS — Z95828 Presence of other vascular implants and grafts: Secondary | ICD-10-CM

## 2023-02-09 DIAGNOSIS — C787 Secondary malignant neoplasm of liver and intrahepatic bile duct: Secondary | ICD-10-CM | POA: Diagnosis not present

## 2023-02-09 DIAGNOSIS — Z79620 Long term (current) use of immunosuppressive biologic: Secondary | ICD-10-CM | POA: Insufficient documentation

## 2023-02-09 DIAGNOSIS — C210 Malignant neoplasm of anus, unspecified: Secondary | ICD-10-CM

## 2023-02-09 DIAGNOSIS — I959 Hypotension, unspecified: Secondary | ICD-10-CM | POA: Diagnosis not present

## 2023-02-09 DIAGNOSIS — E860 Dehydration: Secondary | ICD-10-CM

## 2023-02-09 DIAGNOSIS — Z5112 Encounter for antineoplastic immunotherapy: Secondary | ICD-10-CM | POA: Insufficient documentation

## 2023-02-09 LAB — CMP (CANCER CENTER ONLY)
ALT: 5 U/L (ref 0–44)
AST: 10 U/L — ABNORMAL LOW (ref 15–41)
Albumin: 3.5 g/dL (ref 3.5–5.0)
Alkaline Phosphatase: 82 U/L (ref 38–126)
Anion gap: 13 (ref 5–15)
BUN: 18 mg/dL (ref 8–23)
CO2: 26 mmol/L (ref 22–32)
Calcium: 8.4 mg/dL — ABNORMAL LOW (ref 8.9–10.3)
Chloride: 94 mmol/L — ABNORMAL LOW (ref 98–111)
Creatinine: 0.62 mg/dL (ref 0.44–1.00)
GFR, Estimated: >60 mL/min (ref >60)
Glucose, Bld: 109 mg/dL — ABNORMAL HIGH (ref 70–99)
Potassium: 3.5 mmol/L (ref 3.5–5.1)
Sodium: 133 mmol/L — ABNORMAL LOW (ref 135–145)
Total Bilirubin: 1 mg/dL (ref <1.2)
Total Protein: 6.3 g/dL — ABNORMAL LOW (ref 6.5–8.1)

## 2023-02-09 LAB — CBC WITH DIFFERENTIAL (CANCER CENTER ONLY)
Abs Immature Granulocytes: 0.1 10*3/uL — ABNORMAL HIGH (ref 0.00–0.07)
Basophils Absolute: 0 10*3/uL (ref 0.0–0.1)
Basophils Relative: 0 %
Eosinophils Absolute: 0 10*3/uL (ref 0.0–0.5)
Eosinophils Relative: 0 %
HCT: 27.9 % — ABNORMAL LOW (ref 36.0–46.0)
Hemoglobin: 8.8 g/dL — ABNORMAL LOW (ref 12.0–15.0)
Immature Granulocytes: 1 %
Lymphocytes Relative: 3 %
Lymphs Abs: 0.4 10*3/uL — ABNORMAL LOW (ref 0.7–4.0)
MCH: 30.6 pg (ref 26.0–34.0)
MCHC: 31.5 g/dL (ref 30.0–36.0)
MCV: 96.9 fL (ref 80.0–100.0)
Monocytes Absolute: 0.8 10*3/uL (ref 0.1–1.0)
Monocytes Relative: 7 %
Neutro Abs: 10.4 10*3/uL — ABNORMAL HIGH (ref 1.7–7.7)
Neutrophils Relative %: 89 %
Platelet Count: 367 10*3/uL (ref 150–400)
RBC: 2.88 MIL/uL — ABNORMAL LOW (ref 3.87–5.11)
RDW: 15.1 % (ref 11.5–15.5)
WBC Count: 11.8 10*3/uL — ABNORMAL HIGH (ref 4.0–10.5)
nRBC: 0 % (ref 0.0–0.2)

## 2023-02-09 LAB — TSH: TSH: 0.744 u[IU]/mL (ref 0.350–4.500)

## 2023-02-09 MED ORDER — SODIUM CHLORIDE 0.9 % IV SOLN: Freq: Once | INTRAVENOUS | Status: AC

## 2023-02-09 MED ORDER — SODIUM CHLORIDE 0.9 % IV SOLN
240.0000 mg | Freq: Once | INTRAVENOUS | Status: AC
  Administered 2023-02-09: 240 mg via INTRAVENOUS
  Filled 2023-02-09: qty 24

## 2023-02-09 MED ORDER — SODIUM CHLORIDE 0.9% FLUSH
10.0000 mL | INTRAVENOUS | Status: DC | PRN
Start: 2023-02-09 — End: 2023-02-09
  Administered 2023-02-09: 10 mL

## 2023-02-09 MED ORDER — HEPARIN SOD (PORK) LOCK FLUSH 100 UNIT/ML IV SOLN
500.0000 [IU] | Freq: Once | INTRAVENOUS | Status: AC | PRN
  Administered 2023-02-09: 500 [IU]

## 2023-02-09 MED ORDER — HYDROMORPHONE HCL 2 MG PO TABS
2.0000 mg | ORAL_TABLET | ORAL | 0 refills | Status: DC | PRN
  Filled 2023-02-09 – 2023-02-20 (×2): qty 60, 8d supply, fill #0

## 2023-02-09 MED ORDER — SODIUM CHLORIDE 0.9% FLUSH
10.0000 mL | INTRAVENOUS | Status: DC | PRN
  Administered 2023-02-09: 10 mL via INTRAVENOUS

## 2023-02-09 MED ORDER — SODIUM CHLORIDE 0.9 % IV SOLN
INTRAVENOUS | Status: AC
Start: 2023-02-09 — End: 2023-02-09

## 2023-02-09 NOTE — Telephone Encounter (Signed)
Patient called back and I informed her of message below. Pt verbalized understanding and states she will call back to schedule once form of transportation is found.

## 2023-02-09 NOTE — Progress Notes (Signed)
[facility] OFFICE PROGRESS NOTE   Diagnosis: Anal cancer  INTERVAL HISTORY:   Ms. Alyssa Deleon completed another cycle of nivolumab 01/23/2023.  No rash or diarrhea.  She reports a good appetite and adequate fluid intake.  The bloody mucus in the stool has improved.  She continues to have pain at the anus.  Dilaudid relieves the pain.  She reports chronic exertional dyspnea.  Objective:  Vital signs in last 24 hours:  Blood pressure (!) 86/60, pulse (!) 119, temperature 97.9 F (36.6 C), temperature source Temporal, resp. rate 18, height 5\' 7"  (1.702 m), weight 115 lb (52.2 kg), SpO2 99%.    HEENT: No thrush or ulcers Resp: Lungs clear bilaterally Cardio: Regular rhythm with premature beats, tachycardia GI: No hepatosplenomegaly, nontender, firm ulcerated mass at the right anal verge Vascular: No leg edema  Skin: Nodular area overlying the left trochanter.  She reports there was a previous ulcer in this area.  Portacath/PICC-without erythema  Lab Results:  Lab Results  Component Value Date   WBC 11.8 (H) 02/09/2023   HGB 8.8 (L) 02/09/2023   HCT 27.9 (L) 02/09/2023   MCV 96.9 02/09/2023   PLT 367 02/09/2023   NEUTROABS 10.4 (H) 02/09/2023    CMP  Lab Results  Component Value Date   NA 134 (L) 01/23/2023   K 3.7 01/23/2023   CL 97 (L) 01/23/2023   CO2 28 01/23/2023   GLUCOSE 131 (H) 01/23/2023   BUN 16 01/23/2023   CREATININE 0.57 01/23/2023   CALCIUM 8.8 (L) 01/23/2023   PROT 6.0 (L) 01/23/2023   ALBUMIN 3.4 (L) 01/23/2023   AST 13 (L) 01/23/2023   ALT 8 01/23/2023   ALKPHOS 88 01/23/2023   BILITOT 0.7 01/23/2023   GFRNONAA >60 01/23/2023    No results found for: "CEA1", "CEA", "CAN199", "CA125"  Lab Results  Component Value Date   INR 1.1 09/30/2022   LABPROT 14.2 09/30/2022    Imaging:  No results found.  Medications: I have reviewed the patient's current medications.   Assessment/Plan: Anal cancer Anal mass noted on digital  exam and colonoscopy 05/09/2022-biopsy consistent with squamous cell carcinoma CTs 05/12/2022-no evidence of thoracic metastatic disease, calcified right pleural plaques, asymmetric thickening of the right posterior wall of the distal rectum/anal canal, no evidence of metastatic adenopathy in the abdomen or pelvis, 2 small hypodense left liver lesions favored to represent benign cysts PET scan 05/30/2022-intense hypermetabolic activity at the anus.  No evidence of metastatic adenopathy.  Indeterminate small, mildly hypermetabolic lesion in the lateral segment of the left hepatic lobe. Radiation 06/06/2022-07/19/2022 Cycle one 5-FU/Mitomycin-C 06/06/2022 cycle two 5-FU/Mitomycin-C 07/04/2022 (5-FU dose reduced secondary to diarrhea) MRI liver 06/15/2022-1 cm hypervascular mass in segment 2 of the left hepatic lobe, corresponds to the focus of hypermetabolism on recent PET-CT.  Imaging characteristics atypical for metastatic squamous cell carcinoma.  Follow-up MRI in 3 months Cycle two 5-FU/Mitomycin-C 07/04/2022 (5-FU dose reduced secondary to diarrhea) 08/29/2022 anoscopy, Dr. Maisie Fus, smaller posterior midline rectal mass approximately in the dentate line MRI liver 09/12/2022-enlargement of a complex cystic and solid-appearing enhancing lesion in segment 2 of the liver, 4 x 2.2 cm compared to 1.5 cm on previous study PET scan 09/23/2022-interval increase in size and tracer avidity of a solitary metastasis within the lateral segment of left hepatic lobe.  Tracer avid lesion involving the anus again noted, slightly decreased in size and tracer avidity. 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 Modesta Messing 10/10/2022, presented at multidisciplinary clinic-recommendation  for 3 months of treatment with systemic therapy to address the liver followed by restaging scan and also MRI of the pelvis to assess the primary site  Cycle 1 Taxol/carboplatin 11/03/2022 12/07/2022 chemotherapy discontinued due to  poor performance status Cycle 1 nivolumab 12/12/2022 Cycle 2 nivolumab 12/26/2022 Cycle 3 nivolumab 01/09/2023 Cycle 4 nivolumab 01/23/2023 Cycle 5 nivolumab 02/09/2023 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  She has completed 4 treatments with nivolumab.  She continues to have anal pain.  The anal mass appears slightly smaller on external exam today.  She will complete cycle 5 nivolumab today.  She will undergo a restaging CT prior to an office visit in 2 weeks.  She will continue Dilaudid as needed for pain.  She will receive intravenous fluids and have repeat vital signs in the clinic today.  She has chronic hypotension and tachycardia.  I suspect this is multifactorial with components related to anemia, COPD, and deconditioning.  [name], MD  02/09/2023  9:44 AM

## 2023-02-09 NOTE — Telephone Encounter (Signed)
Spoke with Ashly and was advised we are missing a walking O2 rate with oxygen; also advised we can do a VV with patient and in OV note if patient has resting O2 rate of 88 or lower she automatically qualifies without walking rate. Ok to schedule VV with patient today or this week?

## 2023-02-09 NOTE — Progress Notes (Signed)
Scheduled patient for CT at [facility] on 02/17/23 at 4pm/6pm with port access at 3:30 pm. She understands and agrees to appointments.

## 2023-02-09 NOTE — Telephone Encounter (Signed)
[name] with [employer] requests to be called re: Order for Oxygen

## 2023-02-09 NOTE — Progress Notes (Signed)
Patient seen by Dr. Truett Perna today  Vitals are not all within treatment parameters. BP: 86/60  P-106Okay to proceed per provider  Labs reviewed by Dr. Truett Perna and are within treatment parameters.  Per physician team, patient is ready for treatment and there are NO modifications to the treatment plan. Adding IV 1 liter fluids

## 2023-02-10 ENCOUNTER — Ambulatory Visit: Admit: 2023-02-10 | Payer: BLUE CROSS/BLUE SHIELD | Attending: Adult Health | Primary: Internal Medicine

## 2023-02-10 ENCOUNTER — Encounter: Admit: 2023-02-10 | Payer: PRIVATE HEALTH INSURANCE | Attending: Adult Health | Primary: Internal Medicine

## 2023-02-10 ENCOUNTER — Ambulatory Visit: Admit: 2023-02-10 | Payer: BLUE CROSS/BLUE SHIELD | Primary: Internal Medicine

## 2023-02-10 ENCOUNTER — Inpatient Hospital Stay: Admit: 2023-02-10 | Discharge: 2023-02-10 | Payer: BLUE CROSS/BLUE SHIELD | Primary: Internal Medicine

## 2023-02-10 VITALS — BP 107/72 | HR 75 | Temp 97.00000°F | Resp 18 | Wt 94.0 lb

## 2023-02-10 DIAGNOSIS — Z807 Family history of other malignant neoplasms of lymphoid, hematopoietic and related tissues: Secondary | ICD-10-CM

## 2023-02-10 DIAGNOSIS — R451 Restlessness and agitation: Secondary | ICD-10-CM

## 2023-02-10 DIAGNOSIS — R682 Dry mouth, unspecified: Secondary | ICD-10-CM

## 2023-02-10 DIAGNOSIS — Z880 Allergy status to penicillin: Secondary | ICD-10-CM

## 2023-02-10 DIAGNOSIS — F419 Anxiety disorder, unspecified: Secondary | ICD-10-CM

## 2023-02-10 DIAGNOSIS — Z79899 Other long term (current) drug therapy: Secondary | ICD-10-CM

## 2023-02-10 DIAGNOSIS — B379 Candidiasis, unspecified: Secondary | ICD-10-CM

## 2023-02-10 DIAGNOSIS — K1379 Other lesions of oral mucosa: Secondary | ICD-10-CM

## 2023-02-10 DIAGNOSIS — C3490 Malignant neoplasm of unspecified part of unspecified bronchus or lung: Secondary | ICD-10-CM

## 2023-02-10 DIAGNOSIS — R109 Unspecified abdominal pain: Secondary | ICD-10-CM

## 2023-02-10 DIAGNOSIS — Z5112 Encounter for antineoplastic immunotherapy: Secondary | ICD-10-CM

## 2023-02-10 DIAGNOSIS — Z8619 Personal history of other infectious and parasitic diseases: Secondary | ICD-10-CM

## 2023-02-10 DIAGNOSIS — K5900 Constipation, unspecified: Secondary | ICD-10-CM

## 2023-02-10 DIAGNOSIS — Z791 Long term (current) use of non-steroidal anti-inflammatories (NSAID): Secondary | ICD-10-CM

## 2023-02-10 DIAGNOSIS — Z801 Family history of malignant neoplasm of trachea, bronchus and lung: Secondary | ICD-10-CM

## 2023-02-10 DIAGNOSIS — R300 Dysuria: Secondary | ICD-10-CM

## 2023-02-10 DIAGNOSIS — F17210 Nicotine dependence, cigarettes, uncomplicated: Secondary | ICD-10-CM

## 2023-02-10 DIAGNOSIS — F32A Depression, unspecified: Secondary | ICD-10-CM

## 2023-02-10 DIAGNOSIS — J449 Chronic obstructive pulmonary disease, unspecified: Secondary | ICD-10-CM

## 2023-02-10 DIAGNOSIS — Z79620 Long term (current) use of immunosuppressive biologic: Secondary | ICD-10-CM

## 2023-02-10 DIAGNOSIS — Z720 Tobacco use: Secondary | ICD-10-CM

## 2023-02-10 DIAGNOSIS — Z923 Personal history of irradiation: Secondary | ICD-10-CM

## 2023-02-10 DIAGNOSIS — C3411 Malignant neoplasm of upper lobe, right bronchus or lung: Secondary | ICD-10-CM

## 2023-02-10 DIAGNOSIS — R413 Other amnesia: Secondary | ICD-10-CM

## 2023-02-10 DIAGNOSIS — B1920 Unspecified viral hepatitis C without hepatic coma: Secondary | ICD-10-CM

## 2023-02-10 DIAGNOSIS — B370 Candidal stomatitis: Secondary | ICD-10-CM

## 2023-02-10 LAB — COMPREHENSIVE METABOLIC PANEL
BKR A/G RATIO: 1.1 (ref 1.0–2.2)
BKR ALANINE AMINOTRANSFERASE (ALT): 10 U/L (ref 10–35)
BKR ALBUMIN: 4.2 g/dL (ref 3.6–5.1)
BKR ALKALINE PHOSPHATASE: 96 U/L (ref 9–122)
BKR ANION GAP: 9 (ref 7–17)
BKR ASPARTATE AMINOTRANSFERASE (AST): 30 U/L (ref 10–35)
BKR AST/ALT RATIO: 3
BKR BILIRUBIN TOTAL: 0.2 mg/dL (ref <=1.2)
BKR BLOOD UREA NITROGEN: 16 mg/dL (ref 8–23)
BKR BUN / CREAT RATIO: 35.6 — ABNORMAL HIGH (ref 8.0–23.0)
BKR CALCIUM: 9.5 mg/dL (ref 8.8–10.2)
BKR CHLORIDE: 102 mmol/L (ref 98–107)
BKR CO2: 28 mmol/L (ref 20–30)
BKR CREATININE: 0.45 mg/dL (ref 0.40–1.30)
BKR EGFR, CREATININE (CKD-EPI 2021): >60 mL/min/{1.73_m2} (ref >=60)
BKR GLOBULIN: 3.8 g/dL (ref 2.0–3.9)
BKR GLUCOSE: 95 mg/dL (ref 70–100)
BKR POTASSIUM: 4.7 mmol/L (ref 3.3–5.3)
BKR PROTEIN TOTAL: 8 g/dL (ref 5.9–8.3)
BKR SODIUM: 139 mmol/L (ref 136–144)

## 2023-02-10 LAB — CBC WITH AUTO DIFFERENTIAL
BKR WAM ABSOLUTE IMMATURE GRANULOCYTES.: 0.04 x 1000/ÂµL (ref 0.00–0.30)
BKR WAM ABSOLUTE LYMPHOCYTE COUNT.: 0.63 x 1000/ÂµL (ref 0.60–3.70)
BKR WAM ABSOLUTE NRBC (2 DEC): 0 x 1000/ÂµL (ref 0.00–1.00)
BKR WAM ANC (ABSOLUTE NEUTROPHIL COUNT): 4.5 x 1000/ÂµL (ref 2.00–7.60)
BKR WAM BASOPHIL ABSOLUTE COUNT.: 0.06 x 1000/ÂµL (ref 0.00–1.00)
BKR WAM BASOPHILS: 1 % (ref 0.0–1.4)
BKR WAM EOSINOPHIL ABSOLUTE COUNT.: 0.31 x 1000/ÂµL (ref 0.00–1.00)
BKR WAM EOSINOPHILS: 5.2 % — ABNORMAL HIGH (ref 0.0–5.0)
BKR WAM HEMATOCRIT (2 DEC): 45.4 % — ABNORMAL HIGH (ref 35.00–45.00)
BKR WAM HEMOGLOBIN: 14.8 g/dL (ref 11.7–15.5)
BKR WAM IMMATURE GRANULOCYTES: 0.7 % (ref 0.0–1.0)
BKR WAM LYMPHOCYTES: 10.5 % — ABNORMAL LOW (ref 17.0–50.0)
BKR WAM MCH (PG): 31 pg (ref 27.0–33.0)
BKR WAM MCHC: 32.6 g/dL (ref 31.0–36.0)
BKR WAM MCV: 95 fL (ref 80.0–100.0)
BKR WAM MONOCYTE ABSOLUTE COUNT.: 0.45 x 1000/ÂµL (ref 0.00–1.00)
BKR WAM MONOCYTES: 7.5 % (ref 4.0–12.0)
BKR WAM MPV: 8.2 fL (ref 8.0–12.0)
BKR WAM NEUTROPHILS: 75.1 % — ABNORMAL HIGH (ref 39.0–72.0)
BKR WAM NUCLEATED RED BLOOD CELLS: 0 % (ref 0.0–1.0)
BKR WAM PLATELETS: 269 x1000/ÂµL (ref 150–420)
BKR WAM RDW-CV: 12.4 % (ref 11.0–15.0)
BKR WAM RED BLOOD CELL COUNT.: 4.78 M/ÂµL (ref 4.00–6.00)
BKR WAM WHITE BLOOD CELL COUNT: 6 x1000/ÂµL (ref 4.0–11.0)

## 2023-02-10 LAB — TSH W/REFLEX TO FT4     (BH GH LMW Q YH): BKR THYROID STIMULATING HORMONE: 1.01 u[IU]/mL

## 2023-02-10 LAB — T4: T4, Total: 14.3 ug/dL — ABNORMAL HIGH (ref 4.5–12.0)

## 2023-02-10 MED ORDER — ALBUTEROL SULFATE 2.5 MG/3 ML (0.083 %) SOLUTION FOR NEBULIZATION
2.5 | RESPIRATORY_TRACT | Status: DC | PRN
Start: 2023-02-10 — End: 2023-02-11

## 2023-02-10 MED ORDER — FAMOTIDINE 4 MG/ML IN 0.9% SODIUM CHLORIDE (ADULT)
Freq: Once | INTRAVENOUS | Status: DC | PRN
Start: 2023-02-10 — End: 2023-02-11

## 2023-02-10 MED ORDER — HYDROCORTISONE SOD SUCCINATE (PF) 100 MG/2 ML SOLUTION FOR INJECTION
100 | Freq: Once | INTRAVENOUS | Status: DC | PRN
Start: 2023-02-10 — End: 2023-02-11

## 2023-02-10 MED ORDER — EPINEPHRINE 0.3 MG/0.3 ML INJECTION, AUTO-INJECTOR
0.3 | INTRAMUSCULAR | Status: DC | PRN
Start: 2023-02-10 — End: 2023-02-11

## 2023-02-10 MED ORDER — NYSTATIN 100,000 UNIT/ML ORAL SUSPENSION
100000 | Freq: Four times a day (QID) | ORAL | 1 refills | Status: AC
Start: 2023-02-10 — End: ?

## 2023-02-10 MED ORDER — DURVALUMAB INFUSION
Freq: Once | INTRAVENOUS | Status: CP
Start: 2023-02-10 — End: ?
  Administered 2023-02-10: 21:00:00 250.000 mL/h via INTRAVENOUS

## 2023-02-10 MED ORDER — DIPHENHYDRAMINE 50 MG/ML INJECTION (WRAPPED E-RX)
50 | Freq: Once | INTRAVENOUS | Status: DC | PRN
Start: 2023-02-10 — End: 2023-02-11

## 2023-02-10 MED ORDER — SODIUM CHLORIDE 0.9 % BOLUS (NEW BAG)
0.9 | Freq: Once | INTRAVENOUS | Status: DC | PRN
Start: 2023-02-10 — End: 2023-02-11

## 2023-02-10 NOTE — Telephone Encounter (Signed)
LVM to schedule virtual visit  

## 2023-02-10 NOTE — Telephone Encounter (Signed)
Called pt back and apologized for misunderstanding pt can do VV not in office. Waiting on call back to schedule

## 2023-02-10 NOTE — Progress Notes
 Pt arrived for D1C3 Durvalumab.  PIV placed and labs drawn and sent for processing.Seen by Irving Burton, APRN and cleared for treatment.  U/A and culture ordered if pt complies.Assessment completed and charted.  PIV flushed with brisk blood return noted.Durvalumab infused over 60 minutes without incident.PIV flushed and removed, dry dsg applied to site.Pt voided after infusion and specimen collected for urinalysis and culture and sent.Pt discharged home in stable condition, ambulatory, with husband.

## 2023-02-10 NOTE — Progress Notes
 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 female active smoker (~50 pack-years) with a history of COPD and a now biopsy-proven NSCLC (keratinizing SCC) of the RUL with invasion of the posterior chest wall and ribs 5-6 (PD-L1 TPS 5%, at least cT3N0). She additionally has lesion in the LEFT lung currently under surveillance. 06/25/2022 [place] A/P showed findings concerning for colitis predominant involvement of the right colon and cecum. 07/01/2022 [place] chest showed RIGHT upper lobe mass with chest wall invasion. Mass measures 5.1 x 3.3 x 4.9 cm. This mass erodes into the posterior chest wall with adjacent erosion of the RIGHT posterior fifth and sixth ribs. 07/06/2022 FEV1 37% predicted, FEV1/FVC 35%, DLCO 20%. Severe obstruction, severely impaired diffusing capacity. 07/09/2022 MRI brain with no evidence of metastatic disease to the brain. 07/16/2022 FDG PET/ showed intensely hypermetabolic RIGHT upper lobe pleural based lung mass with chest wall and right 5-6th ribs invasion. Biapical hypermetabolic scarring with more prominent on the LEFT apical nodularity, nonspecific but concerning for malignant involvement.She met with Dr Maxcine Ham (med-onc) and Dr Valora Corporal (Rad onc) - decision reached to proceed with concurrent chemo/RTPRIOR THERAPY: C1D1 carbo/taxol weekly w RT on 6/11/24Completed RT 8/2/24CURRENT THERAPY: C1D1 Durvalumab given 9/20/24INTERIM HISTORY: Courtney Knox presents with her daughter to continue therapy w durva today. Occasionally seeing blood/pink tinge in the urine but continues to have abdominal pain (this is not new for her).  Is not taking the meloxicam for pain that is all over  abdomen, hips, knees and hands.Mouth is sore, dry irritated with white patches to tongue. Will try nystatin rinse. She is not taking zoloft despite reminders from her family. States she is not taking any meds at home. Again encouraged her to use pill box, she became agitated and said she can't manage her meds. Family is supportive and has tried to assist. Bowels moving - usually softer stools.Wears depends, feels overwhelmed by having to change everything PO intake decreasedEncouraged ensure to increase calorie intake.Chronic cough persists, no worsening shortness of breath. She denies headaches, dizziness, fevers, chills, rash and neuropathy. REVIEW OF PAST MEDICAL,SURGICAL,SOCIAL,FAMILY HISTORY: reviewed and unchangedShe  has a past medical history of Anxiety, COPD (chronic obstructive pulmonary disease) (HC Code), Depression, HCV (hepatitis C virus), Lung cancer (HC Code), Memory loss, and Tobacco abuse.She has had a Left wrist surgery. 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 Continues to smoke with roughly 40 pack year history. FAMILY HISTORY:  Father, lung cancer; brother, lymphomaReview of SystemsPertinent items are noted in HPI.A 12- point review of systems, was otherwise negative except as stated in the HPI. ECOG 1ALLERGIES: PenicillinMEDICATIONS: Patient has a current medication list which includes the following prescription(s): b complex vitamins (B COMPLEX) tablet - Take 1 tablet by mouth daily (Patient not taking: Reported on 10/28/2022), calcium carbonate/vitamin D3 (CALCIUM WITH VITAMIN D3 ORAL) - Take 1 tablet by mouth daily (Patient not taking: Reported on 10/28/2022), EPCLUSA 400-100 mg per tablet - Take 1 tablet by mouth daily (Patient not taking: Reported on 11/16/2022), famotidine (PEPCID) 20 mg tablet - Take 1 tablet (20 mg total) by mouth daily (Patient not taking: Reported on 11/16/2022), hydrOXYzine (ATARAX) 25 mg tablet - Take 1 tablet (25 mg total) by mouth 3 (three) times daily as needed for itching (Patient not taking: Reported on 11/16/2022), meloxicam (MOBIC) 15 mg tablet - Take  1 tablet (15 mg total) by mouth daily, sertraline (ZOLOFT) 25 mg tablet - Take 1 tablet (25 mg total) by mouth daily, and traMADoL (ULTRAM) 50 mg tablet - Take 1 tablet (50 mg total) by mouth (Patient not taking: Reported on 11/16/2022).PHYSICAL EXAM:BP 107/72  - Pulse 75  - Temp 97 ?F (36.1 ?C) (Temporal)  - Resp 18  - Wt 42.6 kg  - SpO2 97%  - BMI 17.47 kg/m?  Physical ExamConstitutional:     Appearance: Normal appearance. HENT:    Head: Normocephalic and atraumatic. Eyes:    Conjunctiva/sclera: Conjunctivae normal. Cardiovascular:    Rate and Rhythm: Normal rate and regular rhythm. Pulmonary:    Effort: Pulmonary effort is normal.    Breath sounds: No wheezing, rhonchi or rales.    Comments: Breath sounds decreased to lung basesAbdominal:    General: Bowel sounds are normal. There is no distension.    Palpations: Abdomen is soft.    Tenderness: There is no abdominal tenderness. Musculoskeletal:       General: Normal range of motion.    Cervical back: Normal range of motion.    Right lower leg: No edema.    Left lower leg: No edema.    Comments: TTP to right upper back Lymphadenopathy:    Cervical: No cervical adenopathy. Skin:   General: Skin is warm and dry.    Findings: No erythema or rash. Neurological:    General: No focal deficit present.    Mental Status: She is alert and oriented to person, place, and time. Psychiatric:    Comments: Pressured/tangential speech at times, labile mood Data Review:Lab Results Component Value Date  WBC 6.0 02/10/2023  RBC 4.78 02/10/2023  HGB 14.8 02/10/2023  HCT 45.40 (H) 02/10/2023  MCV 95.0 02/10/2023  MCH 31.0 02/10/2023  MCHC 32.6 02/10/2023  PLT 269 02/10/2023  MPV 8.2 02/10/2023 Lab Results Component Value Date  GLU 95 02/10/2023 BUN 16 02/10/2023  CREATININE 0.45 02/10/2023  NA 139 02/10/2023  K 4.7 02/10/2023  CL 102 02/10/2023  CO2 28 02/10/2023  ALBUMIN 4.2 02/10/2023  PROT 8.0 02/10/2023  BILITOT 0.2 02/10/2023  ALKPHOS 96 02/10/2023  ALT 10 02/10/2023  GLOB 3.8 02/10/2023  CALCIUM 9.5 02/10/2023  IMPRESSION:  Dysuria  (primary encounter diagnosis) Malignant neoplasm of bronchus and lung (HC Code) ThrushMs Dott is a 67 year old woman diagnosed in the spring of 2024 with 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 She did ultimately go to the ED but left before any testing was performed. 10/28/22 - completed concurrent therapy. 12/16/22 - C1D1 durvalumab11/15/24 - Today she presents with her husband to continue durvalumab. Courtney Knox is resistant to taking medications, gets overwhelmed easily. Frustrated by feeling poorly, however her labs look good and her vitals are normal.She has tolerated the durva without new symptoms. Practice RN assisted with dressing scrape and cuts to her left arm/hand. Encouraged her to change dressings/bandages daily to prevent infection. Labs, vitals, ROS reviewed, physical exam completed. Patient meets criteria to proceed with treatment today. PLAN: NSCLC - Proceed with C3 D1 durva todayDysuria - recommended repeat UA/culture today if she will give a specimen (she refused). Constipation - continue miralax daily as needed. Pain - encouraged her to start meloxicamPO intake decreased - offered nutrition assistance but she has previously declined. 	-  will focus on frequent small meals, maximizing calorie intake. 	- will try to resume ensure dailyLabile mood - again encouraged her to take sertraline more consistently, she is resistant to this. Decreased hearing - will consider referral to ENT/audiology in the future. Patient to call with new or worsening symptoms prior to the next visit. RTC in approx 3 weeks for next infusion. Scans due prior to next visit. On the day of this patient's encounter, a total of 53 minutes was personally spent by me.  This does not include any resident/fellow teaching time, or any time spent performing a procedural service.

## 2023-02-11 ENCOUNTER — Emergency Department (HOSPITAL_COMMUNITY): Payer: Medicare Other

## 2023-02-11 ENCOUNTER — Other Ambulatory Visit: Payer: Self-pay

## 2023-02-11 ENCOUNTER — Inpatient Hospital Stay (HOSPITAL_COMMUNITY)
Admission: EM | Admit: 2023-02-11 | Discharge: 2023-02-15 | DRG: 640 | Disposition: A | Discharge status: Home Health | Payer: Medicare Other | Attending: Family Medicine | Admitting: Family Medicine

## 2023-02-11 ENCOUNTER — Encounter (HOSPITAL_COMMUNITY): Payer: Self-pay

## 2023-02-11 DIAGNOSIS — T50915A Adverse effect of multiple unspecified drugs, medicaments and biological substances, initial encounter: Secondary | ICD-10-CM | POA: Diagnosis present

## 2023-02-11 DIAGNOSIS — Z888 Allergy status to other drugs, medicaments and biological substances status: Secondary | ICD-10-CM

## 2023-02-11 DIAGNOSIS — D72829 Elevated white blood cell count, unspecified: Secondary | ICD-10-CM | POA: Diagnosis present

## 2023-02-11 DIAGNOSIS — F339 Major depressive disorder, recurrent, unspecified: Secondary | ICD-10-CM | POA: Diagnosis present

## 2023-02-11 DIAGNOSIS — G9389 Other specified disorders of brain: Secondary | ICD-10-CM | POA: Diagnosis present

## 2023-02-11 DIAGNOSIS — I951 Orthostatic hypotension: Secondary | ICD-10-CM | POA: Diagnosis present

## 2023-02-11 DIAGNOSIS — D638 Anemia in other chronic diseases classified elsewhere: Secondary | ICD-10-CM | POA: Diagnosis present

## 2023-02-11 DIAGNOSIS — R531 Weakness: Secondary | ICD-10-CM

## 2023-02-11 DIAGNOSIS — E878 Other disorders of electrolyte and fluid balance, not elsewhere classified: Secondary | ICD-10-CM

## 2023-02-11 DIAGNOSIS — R000 Tachycardia, unspecified: Secondary | ICD-10-CM | POA: Diagnosis present

## 2023-02-11 DIAGNOSIS — C210 Malignant neoplasm of anus, unspecified: Secondary | ICD-10-CM | POA: Diagnosis present

## 2023-02-11 DIAGNOSIS — R739 Hyperglycemia, unspecified: Secondary | ICD-10-CM | POA: Diagnosis present

## 2023-02-11 DIAGNOSIS — Z853 Personal history of malignant neoplasm of breast: Secondary | ICD-10-CM

## 2023-02-11 DIAGNOSIS — Z87891 Personal history of nicotine dependence: Secondary | ICD-10-CM

## 2023-02-11 DIAGNOSIS — R627 Adult failure to thrive: Secondary | ICD-10-CM | POA: Diagnosis present

## 2023-02-11 DIAGNOSIS — K625 Hemorrhage of anus and rectum: Secondary | ICD-10-CM | POA: Diagnosis present

## 2023-02-11 DIAGNOSIS — Z882 Allergy status to sulfonamides status: Secondary | ICD-10-CM

## 2023-02-11 DIAGNOSIS — Z23 Encounter for immunization: Secondary | ICD-10-CM

## 2023-02-11 DIAGNOSIS — R296 Repeated falls: Secondary | ICD-10-CM | POA: Diagnosis present

## 2023-02-11 DIAGNOSIS — Z8673 Personal history of transient ischemic attack (TIA), and cerebral infarction without residual deficits: Secondary | ICD-10-CM

## 2023-02-11 DIAGNOSIS — I341 Nonrheumatic mitral (valve) prolapse: Secondary | ICD-10-CM | POA: Diagnosis present

## 2023-02-11 DIAGNOSIS — L409 Psoriasis, unspecified: Secondary | ICD-10-CM | POA: Diagnosis present

## 2023-02-11 DIAGNOSIS — G894 Chronic pain syndrome: Secondary | ICD-10-CM | POA: Diagnosis present

## 2023-02-11 DIAGNOSIS — Z9012 Acquired absence of left breast and nipple: Secondary | ICD-10-CM

## 2023-02-11 DIAGNOSIS — Z66 Do not resuscitate: Secondary | ICD-10-CM | POA: Diagnosis present

## 2023-02-11 DIAGNOSIS — E861 Hypovolemia: Secondary | ICD-10-CM | POA: Diagnosis present

## 2023-02-11 DIAGNOSIS — E876 Hypokalemia: Secondary | ICD-10-CM | POA: Diagnosis present

## 2023-02-11 DIAGNOSIS — G319 Degenerative disease of nervous system, unspecified: Secondary | ICD-10-CM | POA: Diagnosis present

## 2023-02-11 DIAGNOSIS — Z79899 Other long term (current) drug therapy: Secondary | ICD-10-CM

## 2023-02-11 DIAGNOSIS — F4310 Post-traumatic stress disorder, unspecified: Secondary | ICD-10-CM | POA: Diagnosis present

## 2023-02-11 DIAGNOSIS — Z87442 Personal history of urinary calculi: Secondary | ICD-10-CM

## 2023-02-11 DIAGNOSIS — Z885 Allergy status to narcotic agent status: Secondary | ICD-10-CM

## 2023-02-11 DIAGNOSIS — Z83438 Family history of other disorder of lipoprotein metabolism and other lipidemia: Secondary | ICD-10-CM

## 2023-02-11 DIAGNOSIS — F410 Panic disorder [episodic paroxysmal anxiety] without agoraphobia: Secondary | ICD-10-CM | POA: Diagnosis present

## 2023-02-11 DIAGNOSIS — E860 Dehydration: Secondary | ICD-10-CM | POA: Diagnosis not present

## 2023-02-11 DIAGNOSIS — Z9109 Other allergy status, other than to drugs and biological substances: Secondary | ICD-10-CM

## 2023-02-11 DIAGNOSIS — M1990 Unspecified osteoarthritis, unspecified site: Secondary | ICD-10-CM | POA: Diagnosis present

## 2023-02-11 DIAGNOSIS — C787 Secondary malignant neoplasm of liver and intrahepatic bile duct: Secondary | ICD-10-CM | POA: Diagnosis present

## 2023-02-11 DIAGNOSIS — Z803 Family history of malignant neoplasm of breast: Secondary | ICD-10-CM

## 2023-02-11 DIAGNOSIS — R809 Proteinuria, unspecified: Secondary | ICD-10-CM | POA: Diagnosis present

## 2023-02-11 DIAGNOSIS — Z818 Family history of other mental and behavioral disorders: Secondary | ICD-10-CM

## 2023-02-11 DIAGNOSIS — G629 Polyneuropathy, unspecified: Secondary | ICD-10-CM | POA: Diagnosis present

## 2023-02-11 DIAGNOSIS — R824 Acetonuria: Secondary | ICD-10-CM | POA: Diagnosis present

## 2023-02-11 DIAGNOSIS — E8809 Other disorders of plasma-protein metabolism, not elsewhere classified: Secondary | ICD-10-CM | POA: Diagnosis present

## 2023-02-11 DIAGNOSIS — Z8249 Family history of ischemic heart disease and other diseases of the circulatory system: Secondary | ICD-10-CM

## 2023-02-11 DIAGNOSIS — K5900 Constipation, unspecified: Secondary | ICD-10-CM | POA: Diagnosis present

## 2023-02-11 DIAGNOSIS — G928 Other toxic encephalopathy: Secondary | ICD-10-CM | POA: Diagnosis present

## 2023-02-11 DIAGNOSIS — Z87440 Personal history of urinary (tract) infections: Secondary | ICD-10-CM

## 2023-02-11 DIAGNOSIS — F22 Delusional disorders: Secondary | ICD-10-CM | POA: Diagnosis present

## 2023-02-11 DIAGNOSIS — E871 Hypo-osmolality and hyponatremia: Secondary | ICD-10-CM | POA: Diagnosis present

## 2023-02-11 DIAGNOSIS — H04123 Dry eye syndrome of bilateral lacrimal glands: Secondary | ICD-10-CM | POA: Diagnosis present

## 2023-02-11 DIAGNOSIS — E782 Mixed hyperlipidemia: Secondary | ICD-10-CM | POA: Diagnosis present

## 2023-02-11 DIAGNOSIS — D539 Nutritional anemia, unspecified: Secondary | ICD-10-CM | POA: Diagnosis present

## 2023-02-11 DIAGNOSIS — J439 Emphysema, unspecified: Secondary | ICD-10-CM | POA: Diagnosis present

## 2023-02-11 DIAGNOSIS — F411 Generalized anxiety disorder: Secondary | ICD-10-CM | POA: Diagnosis present

## 2023-02-11 DIAGNOSIS — G9341 Metabolic encephalopathy: Secondary | ICD-10-CM

## 2023-02-11 DIAGNOSIS — Z96652 Presence of left artificial knee joint: Secondary | ICD-10-CM | POA: Diagnosis present

## 2023-02-11 LAB — URINALYSIS-MACROSCOPIC W/REFLEX MICROSCOPIC
BKR BILIRUBIN, UA: NEGATIVE
BKR GLUCOSE, UA: NEGATIVE
BKR KETONES, UA: NEGATIVE
BKR NITRITE, UA: POSITIVE — AB
BKR PH, UA: 5.5 (ref 5.5–7.5)
BKR SPECIFIC GRAVITY, UA: 1.026 (ref 1.005–1.030)
BKR UROBILINOGEN, UA (MG/DL): <2 mg/dL (ref <=2.0)

## 2023-02-11 LAB — URINE MICROSCOPIC     (BH GH LMW YH)
BKR RBC/HPF INSTRUMENT: 25 /HPF — ABNORMAL HIGH (ref 0–2)
BKR TRANS EPITHELIAL CELLS, UA (NUMERIC): <1 /HPF — ABNORMAL HIGH
BKR URINE SQUAMOUS EPITHELIAL CELLS, UA (NUMERIC): <1 /HPF (ref 0–5)
BKR WBC/HPF INSTRUMENT: 65 /HPF — ABNORMAL HIGH (ref 0–5)

## 2023-02-11 LAB — COMPREHENSIVE METABOLIC PANEL
ALT: 9 U/L (ref 0–44)
AST: 16 U/L (ref 15–41)
Albumin: 2.9 g/dL — ABNORMAL LOW (ref 3.5–5.0)
Alkaline Phosphatase: 87 U/L (ref 38–126)
Anion gap: 13 (ref 5–15)
BUN: 10 mg/dL (ref 8–23)
CO2: 23 mmol/L (ref 22–32)
Calcium: 8.2 mg/dL — ABNORMAL LOW (ref 8.9–10.3)
Chloride: 95 mmol/L — ABNORMAL LOW (ref 98–111)
Creatinine, Ser: 0.5 mg/dL (ref 0.44–1.00)
GFR, Estimated: >60 mL/min (ref >60)
Glucose, Bld: 134 mg/dL — ABNORMAL HIGH (ref 70–99)
Potassium: 3.1 mmol/L — ABNORMAL LOW (ref 3.5–5.1)
Sodium: 131 mmol/L — ABNORMAL LOW (ref 135–145)
Total Bilirubin: 1.6 mg/dL — ABNORMAL HIGH (ref <1.2)
Total Protein: 6.5 g/dL (ref 6.5–8.1)

## 2023-02-11 LAB — URINALYSIS, ROUTINE W REFLEX MICROSCOPIC
Glucose, UA: NEGATIVE mg/dL
Hgb urine dipstick: NEGATIVE
Ketones, ur: 80 mg/dL — AB
Leukocytes,Ua: NEGATIVE
Nitrite: NEGATIVE
Protein, ur: 100 mg/dL — AB
Specific Gravity, Urine: 1.019 (ref 1.005–1.030)
pH: 6 (ref 5.0–8.0)

## 2023-02-11 LAB — CBC
HCT: 31.1 % — ABNORMAL LOW (ref 36.0–46.0)
Hemoglobin: 9.4 g/dL — ABNORMAL LOW (ref 12.0–15.0)
MCH: 30.4 pg (ref 26.0–34.0)
MCHC: 30.2 g/dL (ref 30.0–36.0)
MCV: 100.6 fL — ABNORMAL HIGH (ref 80.0–100.0)
Platelets: 324 10*3/uL (ref 150–400)
RBC: 3.09 MIL/uL — ABNORMAL LOW (ref 3.87–5.11)
RDW: 15.2 % (ref 11.5–15.5)
WBC: 10.6 10*3/uL — ABNORMAL HIGH (ref 4.0–10.5)
nRBC: 0 % (ref 0.0–0.2)

## 2023-02-11 LAB — TROPONIN I (HIGH SENSITIVITY)
Troponin I (High Sensitivity): 5 ng/L (ref <18)
Troponin I (High Sensitivity): 4 ng/L (ref <18)

## 2023-02-11 LAB — CBG MONITORING, ED: Glucose-Capillary: 115 mg/dL — ABNORMAL HIGH (ref 70–99)

## 2023-02-11 MED ORDER — DULOXETINE HCL 30 MG PO CPEP
60.0000 mg | ORAL_CAPSULE | Freq: Every day | ORAL | Status: DC
  Administered 2023-02-12 – 2023-02-15 (×4): 60 mg via ORAL
  Filled 2023-02-11 (×4): qty 2

## 2023-02-11 MED ORDER — PHENYLEPHRINE IN HARD FAT 0.25 % RE SUPP
1.0000 | Freq: Two times a day (BID) | RECTAL | Status: DC
  Filled 2023-02-11 (×8): qty 1

## 2023-02-11 MED ORDER — INFLUENZA VAC A&B SURF ANT ADJ 0.5 ML IM SUSY
0.5000 mL | PREFILLED_SYRINGE | INTRAMUSCULAR | Status: AC
  Administered 2023-02-15: 0.5 mL via INTRAMUSCULAR
  Filled 2023-02-11 (×2): qty 0.5

## 2023-02-11 MED ORDER — POTASSIUM CHLORIDE CRYS ER 20 MEQ PO TBCR
40.0000 meq | EXTENDED_RELEASE_TABLET | Freq: Once | ORAL | Status: AC
  Administered 2023-02-11: 40 meq via ORAL
  Filled 2023-02-11: qty 2

## 2023-02-11 MED ORDER — SENNOSIDES-DOCUSATE SODIUM 8.6-50 MG PO TABS
1.0000 | ORAL_TABLET | Freq: Every day | ORAL | Status: DC
  Administered 2023-02-11 – 2023-02-14 (×4): 1 via ORAL
  Filled 2023-02-11 (×4): qty 1

## 2023-02-11 MED ORDER — ONDANSETRON HCL 4 MG/2ML IJ SOLN: 4.0000 mg | Freq: Three times a day (TID) | INTRAMUSCULAR | Status: DC | PRN

## 2023-02-11 MED ORDER — VARENICLINE TARTRATE 0.03 MG/ACT NA SOLN: 1.0000 | Freq: Every morning | NASAL | Status: DC

## 2023-02-11 MED ORDER — LACTATED RINGERS IV SOLN: INTRAVENOUS | Status: AC

## 2023-02-11 MED ORDER — SIMVASTATIN 40 MG PO TABS
40.0000 mg | ORAL_TABLET | Freq: Every day | ORAL | Status: DC
  Administered 2023-02-11 – 2023-02-14 (×4): 40 mg via ORAL
  Filled 2023-02-11 (×4): qty 1

## 2023-02-11 MED ORDER — ONDANSETRON 4 MG PO TBDP: 4.0000 mg | ORAL_TABLET | Freq: Three times a day (TID) | ORAL | Status: DC | PRN

## 2023-02-11 MED ORDER — POLYETHYLENE GLYCOL 3350 17 G PO PACK
17.0000 g | PACK | Freq: Every day | ORAL | Status: DC
  Administered 2023-02-11 – 2023-02-15 (×4): 17 g via ORAL
  Filled 2023-02-11 (×4): qty 1

## 2023-02-11 MED ORDER — ACETAMINOPHEN 325 MG PO TABS
650.0000 mg | ORAL_TABLET | Freq: Four times a day (QID) | ORAL | Status: DC | PRN
  Administered 2023-02-12: 650 mg via ORAL
  Filled 2023-02-11: qty 2

## 2023-02-11 MED ORDER — ENOXAPARIN SODIUM 40 MG/0.4ML IJ SOSY
40.0000 mg | PREFILLED_SYRINGE | INTRAMUSCULAR | Status: DC
  Administered 2023-02-11 – 2023-02-14 (×4): 40 mg via SUBCUTANEOUS
  Filled 2023-02-11 (×4): qty 0.4

## 2023-02-11 MED ORDER — LACTATED RINGERS IV BOLUS
1000.0000 mL | Freq: Once | INTRAVENOUS | Status: AC
  Administered 2023-02-11: 1000 mL via INTRAVENOUS

## 2023-02-11 MED ORDER — LACTATED RINGERS IV BOLUS
500.0000 mL | Freq: Once | INTRAVENOUS | Status: AC
  Administered 2023-02-11: 500 mL via INTRAVENOUS

## 2023-02-11 MED ORDER — BUPROPION HCL ER (XL) 150 MG PO TB24
300.0000 mg | ORAL_TABLET | Freq: Every day | ORAL | Status: DC
  Administered 2023-02-12 – 2023-02-15 (×4): 300 mg via ORAL
  Filled 2023-02-11 (×5): qty 2

## 2023-02-11 MED ORDER — EZETIMIBE 10 MG PO TABS
10.0000 mg | ORAL_TABLET | Freq: Every day | ORAL | Status: DC
  Administered 2023-02-12 – 2023-02-15 (×4): 10 mg via ORAL
  Filled 2023-02-11 (×4): qty 1

## 2023-02-11 MED ORDER — HYDROMORPHONE HCL 2 MG PO TABS
2.0000 mg | ORAL_TABLET | ORAL | Status: DC | PRN
  Administered 2023-02-11 – 2023-02-12 (×2): 4 mg via ORAL
  Administered 2023-02-12 (×2): 2 mg via ORAL
  Administered 2023-02-13 – 2023-02-14 (×2): 4 mg via ORAL
  Filled 2023-02-11 (×2): qty 1
  Filled 2023-02-11 (×2): qty 2
  Filled 2023-02-11: qty 1
  Filled 2023-02-11: qty 2
  Filled 2023-02-11: qty 1

## 2023-02-11 MED ORDER — ENSURE ENLIVE PO LIQD
237.0000 mL | Freq: Two times a day (BID) | ORAL | Status: DC
  Administered 2023-02-13 – 2023-02-14 (×2): 237 mL via ORAL

## 2023-02-11 NOTE — ED Triage Notes (Signed)
Patient brought in by EMS due to acting "out of sorts" that was witnessed by daughter. When EMS arrived daughter reported that patient was back to baseline status. CBG 137 in route. Patient states that she was unable to get her words out, which has resolved since. Patient is a cancer patient that is currently undergoing treatment. Patient reports prior stroke in 1999.

## 2023-02-11 NOTE — ED Notes (Signed)
ED TO INPATIENT HANDOFF REPORT  Name/Age/Gender Alyssa Deleon 67 y.o. female  Code Status    Code Status Orders  (From admission, onward)           Start     Ordered   02/11/23 1543  Full code  Continuous       Question:  By:  Answer:  Consent: discussion documented in EHR   02/11/23 1543           Code Status History     Date Active Date Inactive Code Status Order ID Comments User Context   10/31/2022 1242 11/01/2022 0503 Full Code [id number]  Bennie Dallas, MD HOV   09/30/2022 1352 10/01/2022 0508 Full Code [id number]  Suttle, Thressa Sheller, MD HOV   07/01/2022 1415 07/02/2022 0505 Full Code [id number]  Allred, Darrell K, PA-C HOV   06/03/2022 1637 06/04/2022 0504 Full Code [id number]  Allred, Rosalita Levan, PA-C HOV       Home/SNF/Other Home  Chief Complaint Dehydration [E86.0]  Level of Care/Admitting Diagnosis ED Disposition     ED Disposition  Admit   Condition  --   Comment  Hospital Area: [facility] [[id number]]  Level of Care: Med-Surg [16]  May place patient in observation at [facility] or [facility] if equivalent level of care is available:: No  Covid Evaluation: Asymptomatic - no recent exposure (last 10 days) testing not required  Diagnosis: Dehydration [276.51.ICD-9-CM]  Admitting Physician: Briscoe Burns [[id number]]  Attending Physician: Briscoe Burns [[id number]]          Medical History Past Medical History:  Diagnosis Date   Arthritis    Breast cancer (HCC)    Cataract, left 09/29/2017   Chronic pain syndrome, on Neurontin 09/30/2017   Dry eye syndrome of both eyes, on Restasis 09/30/2017   Emphysema lung (HCC)    Family history of breast cancer    Family history of melanoma    Family history of ovarian cancer    Family history of prostate cancer    Family history of throat cancer    Frequent urinary tract infections    GAD (generalized anxiety disorder) 03/11/2015   Genital herpes    History of chicken pox    History of  degenerative disc disease 10/27/2002   Kidney stone    Knee osteonecrosis, 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   Major depression, recurrent, chronic (HCC), followed by Psych, on Wellbutrin, Cymbalta 09/30/2017   Mixed hyperlipidemia, on Zetia and Zocor 09/30/2017   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   MVP (mitral valve prolapse) 03/13/2015   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   Nuclear sclerosis 06/10/2013   Panic disorder, followed by Psych, on Xanax 09/30/2017   Personal history of breast cancer    Psoriasis    PTSD (post-traumatic stress disorder) 03/11/2015   Stroke (HCC)    Total knee replacement status, 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   Vitamin D deficiency 09/30/2017    Allergies Allergies  Allergen Reactions   Hydrocodone-Acetaminophen Shortness Of Breath and Other (See Comments)    If more than 2 a day are taken   Klonopin [Clonazepam] Swelling and Other (See Comments)    Coating on the tablet caused swelling in the mouth   Morphine Sulfate Itching   Rosuvastatin Other (See Comments)    Myalgia and Muscle cramps    Venlafaxine Other (See Comments)    Weight gain   Grass Pollen(K-O-R-T-Swt Vern) Itching and Other (See Comments)    Itchy eyes, runny nose  Seasonal Ic [Cholestatin] Other (See Comments) and Cough    Pollen = sinus issues, also   Ezetimibe-Simvastatin Other (See Comments)    Myalgias- Causes cramps- cannot take in a combined form, but tolerates separately   Sulfa Antibiotics Diarrhea and Other (See Comments)    Stomach ache, too    IV Location/Drains/Wounds Patient Lines/Drains/Airways Status     Active Line/Drains/Airways     Name Placement date Placement time Site Days   Implanted Port 10/31/22 Right Chest 10/31/22  1210  Chest  103   Peripheral IV 02/11/23 20 G 1" Right Antecubital 02/11/23  1006  Antecubital  less than 1            Labs/Imaging Results for orders placed or performed during the hospital encounter of 02/11/23 (from the past  48 hour(s))  Comprehensive metabolic panel     Status: Abnormal   Collection Time: 02/11/23 10:03 AM  Result Value Ref Range   Sodium 131 (L) 135 - 145 mmol/L   Potassium 3.1 (L) 3.5 - 5.1 mmol/L   Chloride 95 (L) 98 - 111 mmol/L   CO2 23 22 - 32 mmol/L   Glucose, Bld 134 (H) 70 - 99 mg/dL    Comment: Glucose reference range applies only to samples taken after fasting for at least 8 hours.   BUN 10 8 - 23 mg/dL   Creatinine, Ser 1.47 0.44 - 1.00 mg/dL   Calcium 8.2 (L) 8.9 - 10.3 mg/dL   Total Protein 6.5 6.5 - 8.1 g/dL   Albumin 2.9 (L) 3.5 - 5.0 g/dL   AST 16 15 - 41 U/L   ALT 9 0 - 44 U/L   Alkaline Phosphatase 87 38 - 126 U/L   Total Bilirubin 1.6 (H) <1.2 mg/dL   GFR, Estimated >82 >95 mL/min    Comment: (NOTE) Calculated using the CKD-EPI Creatinine Equation (2021)    Anion gap 13 5 - 15    Comment: Performed at [facility], [address], [place], [place] [address]  CBC     Status: Abnormal   Collection Time: 02/11/23 10:03 AM  Result Value Ref Range   WBC 10.6 (H) 4.0 - 10.5 K/uL   RBC 3.09 (L) 3.87 - 5.11 MIL/uL   Hemoglobin 9.4 (L) 12.0 - 15.0 g/dL   HCT 86.5 (L) 78.4 - 69.6 %   MCV 100.6 (H) 80.0 - 100.0 fL   MCH 30.4 26.0 - 34.0 pg   MCHC 30.2 30.0 - 36.0 g/dL   RDW 29.5 28.4 - 13.2 %   Platelets 324 150 - 400 K/uL   nRBC 0.0 0.0 - 0.2 %    Comment: Performed at [facility], [address] [place], [place] [address]  Troponin I (High Sensitivity)     Status: None   Collection Time: 02/11/23 10:03 AM  Result Value Ref Range   Troponin I (High Sensitivity) 5 <18 ng/L    Comment: (NOTE) Elevated high sensitivity troponin I (hsTnI) values and significant  changes across serial measurements may suggest ACS but many other  chronic and acute conditions are known to elevate hsTnI results.  Refer to the "Links" section for chest pain algorithms and additional  guidance. Performed at [facility], [address] [place], [place] [address]   CBG monitoring, ED     Status: Abnormal   Collection Time: 02/11/23 10:45 AM  Result Value Ref Range   Glucose-Capillary 115 (H) 70 - 99 mg/dL  Comment: Glucose reference range applies only to samples taken after fasting for at least 8 hours.  Troponin I (High Sensitivity)     Status: None   Collection Time: 02/11/23 12:12 PM  Result Value Ref Range   Troponin I (High Sensitivity) 4 <18 ng/L    Comment: (NOTE) Elevated high sensitivity troponin I (hsTnI) values and significant  changes across serial measurements may suggest ACS but many other  chronic and acute conditions are known to elevate hsTnI results.  Refer to the "Links" section for chest pain algorithms and additional  guidance. Performed at [facility], [address], [place], [place] [address]   Urinalysis, Routine w reflex microscopic -Urine, Clean Catch     Status: Abnormal   Collection Time: 02/11/23  1:41 PM  Result Value Ref Range   Color, Urine AMBER (A) YELLOW    Comment: BIOCHEMICALS MAY BE AFFECTED BY COLOR   APPearance CLEAR CLEAR   Specific Gravity, Urine 1.019 1.005 - 1.030   pH 6.0 5.0 - 8.0   Glucose, UA NEGATIVE NEGATIVE mg/dL   Hgb urine dipstick NEGATIVE NEGATIVE   Bilirubin Urine SMALL (A) NEGATIVE   Ketones, ur 80 (A) NEGATIVE mg/dL   Protein, ur 657 (A) NEGATIVE mg/dL   Nitrite NEGATIVE NEGATIVE   Leukocytes,Ua NEGATIVE NEGATIVE   RBC / HPF 0-5 0 - 5 RBC/hpf   WBC, UA 0-5 0 - 5 WBC/hpf   Bacteria, UA RARE (A) NONE SEEN   Squamous Epithelial / HPF 0-5 0 - 5 /HPF   Mucus PRESENT    Hyaline Casts, UA PRESENT     Comment: Performed at [facility], [address], [place], [place] [address]   CT Head Wo Contrast  Result Date: 02/11/2023 CLINICAL DATA:  Mental status change with unknown cause. Out of sorts. EXAM: CT HEAD WITHOUT CONTRAST TECHNIQUE: Contiguous axial images were obtained from the base of the skull  through the vertex without intravenous contrast. RADIATION DOSE REDUCTION: This exam was performed according to the departmental dose-optimization program which includes automated exposure control, adjustment of the mA and/or kV according to patient size and/or use of iterative reconstruction technique. COMPARISON:  Head CT 11/22/2022 FINDINGS: Brain: Left frontotemporal encephalomalacia which is dense. There is chart history of brain tumor and stroke. The brain is generally atrophic without superimposed acute infarct, hemorrhage, hydrocephalus, mass, or collection. Vascular: No hyperdense vessel or unexpected calcification. Skull: Unremarkable remote left pterional craniotomy. Sinuses/Orbits: Negative IMPRESSION: Stable head CT.  No acute finding. Left frontotemporal encephalomalacia and brain atrophy. Electronically Signed   By: Tiburcio Pea M.D.   On: 02/11/2023 10:58   DG Chest Portable 1 View  Result Date: 02/11/2023 CLINICAL DATA:  68 year old female with history of altered mental status. EXAM: PORTABLE CHEST 1 VIEW COMPARISON:  Chest x-ray 09/09/2022. FINDINGS: Right internal jugular single-lumen porta cath with tip terminating in the distal superior vena cava. Lung volumes are normal. No consolidative airspace disease. No pleural effusions. No pneumothorax. Calcified granuloma in the right mid lung. No pulmonary nodule or mass noted. Pulmonary vasculature and the cardiomediastinal silhouette are within normal limits. Atherosclerosis in the thoracic aorta. IMPRESSION: 1.  No radiographic evidence of acute cardiopulmonary disease. 2. Aortic atherosclerosis. Electronically Signed   By: Trudie Reed M.D.   On: 02/11/2023 10:52    Pending Labs Unresulted Labs (From admission, onward)     Start     Ordered   02/18/23 0500  Creatinine, serum  (enoxaparin (LOVENOX)  CrCl >/= 30 ml/min)  Weekly,   R     Comments: while on enoxaparin therapy    02/11/23 1543   02/12/23 0500  Comprehensive  metabolic panel  Tomorrow morning,   R        02/11/23 1543   02/12/23 0500  CBC  Tomorrow morning,   R        02/11/23 1543   02/11/23 1543  HIV Antibody (routine testing w rflx)  (HIV Antibody (Routine testing w reflex) panel)  Once,   R        02/11/23 1543   02/11/23 1543  TSH  Once,   R        02/11/23 1543            Vitals/Pain Today's Vitals   02/11/23 0940 02/11/23 0947 02/11/23 1230 02/11/23 1407  BP:  120/79 120/80   Pulse:  (!) 106 89   Resp:  16 19   Temp:  98.5 F (36.9 C)  98.3 F (36.8 C)  TempSrc:  Oral  Oral  SpO2:  100% 100%   Weight: 115 lb 1.3 oz (52.2 kg)     Height: 5\' 7"  (1.702 m)     PainSc:        Isolation Precautions No active isolations  Medications Medications  lactated ringers infusion ( Intravenous New Bag/Given 02/11/23 1157)  enoxaparin (LOVENOX) injection 40 mg (has no administration in time range)  senna-docusate (Senokot-S) tablet 1 tablet (has no administration in time range)  ondansetron (ZOFRAN-ODT) disintegrating tablet 4 mg (has no administration in time range)    Or  ondansetron (ZOFRAN) injection 4 mg (has no administration in time range)  potassium chloride SA (KLOR-CON M) CR tablet 40 mEq (40 mEq Oral Given 02/11/23 1119)  lactated ringers bolus 1,000 mL (0 mLs Intravenous Stopped 02/11/23 1547)    Mobility walks

## 2023-02-11 NOTE — H&P (Addendum)
History and Physical    Patient: Alyssa Deleon BJY:[id number] DOB: 10-15-1955 DOA: 02/11/2023 DOS: the patient was seen and examined on 02/11/2023 PCP: Allwardt, Crist Infante, PA-C  Patient coming from: Home  Chief Complaint:  Chief Complaint  Patient presents with   Weakness   Altered Mental Status   HPI: Alyssa Deleon is a 67 y.o. female with medical history significant of metastatic anal cancer receiving nivolumab (follows Dr. Truett Perna, 5th cycle 02/09/2023), history of left breast cancer status post left mastectomy and TRAM, anemia of chronic disease, chronic pain, emphysema, MDD, hyperlipidemia, neuropathy, stroke, recurrent falls presenting to the ED with generalized weakness and an episode of confusion at home which has since resolved.  She states that she received her fifth dose of nivolumab on 11/14.  Later that night she was feeling somewhat weak and went to sleep.  Next thing she knew she woke up on 11/16 at about 4 or 5 AM.  When she woke up she was feeling confused and she called her daughter, Antony Contras.  Patient lives alone but Antony Contras came to check on her and noticed that she was not feeling well.  Knightley also notes that she was unable to walk around per usual baseline as she was feeling like her bilateral lower extremity neuropathy was more prominent.  She tried to eat some oats but was unable to eat them despite feeling hungry.  She was subsequently brought in by EMS to [facility], ED.  She denies any nausea, vomiting, chest pain, palpitations, shortness of breath, abdominal pain.  She does note that her generalized health has been deteriorating since she began receiving therapy for her metastatic anal cancer.  She feels like her neuropathy and her weakness are the most prominent symptoms.  Her next visit with Dr. Truett Perna is scheduled for 11/22  ED course: Vital signs stable aside from mild intermittent tachycardia.  CBC showing a mild leukocytosis, anemia around her usual  baseline.  CMP showed mild hyponatremia (chronic), mild hypokalemia, mild hyperglycemia, normal kidney function, hypoalbuminemia (chronic).  UA revealed some ketonuria and proteinuria but was otherwise negative.  Troponins were negative x 2.  CT head showed no acute findings but did reveal left frontal temporal encephalomalacia and brain atrophy, both of which were noted on prior CT head in August 2024.  X-ray negative.  Orthostatics done in the ED were positive and thus she was given about 1.5 L of fluid IV.  [facility] hospitalist asked to admit.   Review of Systems: As mentioned in the history of present illness. All other systems reviewed and are negative. Past Medical History:  Diagnosis Date   Arthritis    Breast cancer (HCC)    Cataract, left 09/29/2017   Chronic pain syndrome, on Neurontin 09/30/2017   Dry eye syndrome of both eyes, on Restasis 09/30/2017   Emphysema lung (HCC)    Family history of breast cancer    Family history of melanoma    Family history of ovarian cancer    Family history of prostate cancer    Family history of throat cancer    Frequent urinary tract infections    GAD (generalized anxiety disorder) 03/11/2015   Genital herpes    History of chicken pox    History of degenerative disc disease 10/27/2002   Kidney stone    Knee osteonecrosis, 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   Major depression, recurrent, chronic (HCC), followed by Psych, on Wellbutrin, Cymbalta 09/30/2017   Mixed hyperlipidemia, on Zetia and Zocor 09/30/2017  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   MVP (mitral valve prolapse) 03/13/2015   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   Nuclear sclerosis 06/10/2013   Panic disorder, followed by Psych, on Xanax 09/30/2017   Personal history of breast cancer    Psoriasis    PTSD (post-traumatic stress disorder) 03/11/2015   Stroke (HCC)    Total knee replacement status, 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 Her smoking use included cigarettes. She started smoking about 44 years ago. She has a 31.5 pack-year smoking history. She has been exposed to tobacco smoke. She has never used smokeless tobacco. She reports current alcohol use. She reports that she does not use drugs.  Allergies  Allergen Reactions   Hydrocodone-Acetaminophen Shortness Of Breath and Other (See Comments)    If more than 2 a day are taken   Klonopin [Clonazepam] Swelling and Other (See Comments)    Coating on the tablet caused swelling in the mouth   Morphine Sulfate Itching   Rosuvastatin Other (See Comments)    Myalgia and Muscle cramps    Venlafaxine Other (See Comments)    Weight gain   Grass Pollen(K-O-R-T-Swt Vern) Itching and Other (See Comments)    Itchy eyes, runny nose   Seasonal Ic [Cholestatin] Other (See Comments) and Cough    Pollen = sinus issues, also   Ezetimibe-Simvastatin Other (See Comments)    Myalgias- Causes cramps- cannot take in a combined form, but tolerates separately   Sulfa Antibiotics Diarrhea and Other (See Comments)    Stomach ache, too    Family History  Problem Relation Age of Onset   Cancer - Other Mother    Miscarriages / [place] Mother    Alzheimer's disease Mother    Breast cancer Mother    Skin cancer Mother    Cancer - Prostate Father        metastatic   Hyperlipidemia Father    Throat cancer Father    Cancer - Other Sister    Depression Sister    Hearing loss Sister    Hyperlipidemia Sister    Breast cancer Sister        both breasts   Skin cancer Sister    Cancer - Other Sister    Breast cancer Sister 27   Cancer - Other Brother    Throat  cancer Brother    Breast cancer Paternal Aunt    Prostate cancer Paternal Uncle    Breast cancer Maternal Grandmother    Alzheimer's disease Maternal Grandfather    Ovarian cancer Paternal Grandmother    Heart attack Paternal Grandfather    Breast cancer Other        both breasts dx 17   Liver disease Neg Hx    Colon cancer Neg Hx    Esophageal cancer Neg Hx    Rectal cancer Neg Hx    Stomach cancer Neg Hx     Prior to Admission medications   Medication Sig Start Date End Date Taking? Authorizing Provider  acetaminophen (TYLENOL) 500 MG tablet Take 1,000 mg by mouth every 6 (  six) hours as needed for mild pain (pain score 1-3) or headache.   Yes [provider]  ALPRAZolam (XANAX) 1 MG tablet TAKE 2 TABLETS BY MOUTH EVERY  NIGHT AT BEDTIME AS NEEDED FOR  ANXIETY Patient taking differently: Take 1-1.5 mg by mouth See admin instructions. Take 1.5 mg by mouth at bedtime and an additional 1 mg one to two times a day as needed for panic/anxiety 09/20/22  Yes Allwardt, Alyssa M, PA-C  buPROPion (WELLBUTRIN XL) 300 MG 24 hr tablet TAKE 1 TABLET BY MOUTH DAILY Patient taking differently: Take 300 mg by mouth in the morning. 03/18/22  Yes Allwardt, Alyssa M, PA-C  Calcium Carb-Cholecalciferol (CALCIUM + D3 PO) Take 1 tablet by mouth daily with breakfast.   Yes [provider]  denosumab (PROLIA) 60 MG/ML SOSY injection Inject 60 mg into the skin every 6 (six) months.   Yes [provider]  DULoxetine (CYMBALTA) 60 MG capsule TAKE 1 CAPSULE BY MOUTH DAILY Patient taking differently: Take 60 mg by mouth in the morning. 03/18/22  Yes Allwardt, Alyssa M, PA-C  ezetimibe (ZETIA) 10 MG tablet Take 10 mg by mouth in the morning.   Yes [provider]  gabapentin (NEURONTIN) 300 MG capsule TAKE 1 CAPSULE BY MOUTH TWICE  DAILY Patient taking differently: Take 300 mg by mouth in the morning. 10/10/22  Yes Allwardt, Alyssa M, PA-C  HYDROmorphone (DILAUDID) 2 MG tablet Take  1-2 tablets (2-4 mg total) by mouth every 4 (four) hours as needed for severe pain (pain score 7-10). 02/09/23  Yes Ladene Artist, MD  magic mouthwash (nystatin, diphenhydrAMINE, alum & mag hydroxide) suspension mixture Swish and spit 5 mL by mouth as needed for mouth pain. 06/06/22  Yes   simvastatin (ZOCOR) 40 MG tablet Take 40 mg by mouth at bedtime.   Yes [provider]  TYRVAYA 0.03 MG/ACT SOLN Place 1 spray into both nostrils in the morning. 02/22/22  Yes [provider]  Budeson-Glycopyrrol-Formoterol (BREZTRI AEROSPHERE) 160-9-4.8 MCG/ACT AERO Inhale 2 puffs into the lungs in the morning and at bedtime. Rinse mouth after use.  DAILY USE FOR COPD MAINTENANCE TREATMENT. Patient not taking: Reported on 02/11/2023 10/27/22   Allwardt, Crist Infante, PA-C  diphenoxylate-atropine (LOMOTIL) 2.5-0.025 MG tablet Take 1 tablet by mouth 4 (four) times daily as needed for diarrhea or loose stools. Patient not taking: Reported on 02/11/2023 07/11/22   Ronny Bacon, PA-C  ondansetron (ZOFRAN) 8 MG tablet Take 1 tablet (8 mg total) by mouth every 8 (eight) hours as needed for nausea or vomiting. Patient not taking: Reported on 02/11/2023 06/03/22   Ladene Artist, MD  prochlorperazine (COMPAZINE) 10 MG tablet Take 1 tablet (10 mg total) by mouth every 6 (six) hours as needed for nausea or vomiting. Patient not taking: Reported on 02/11/2023 06/03/22   Ladene Artist, MD  traZODone (DESYREL) 150 MG tablet Take 150 mg by mouth at bedtime. Patient not taking: Reported on 02/11/2023    [provider]    Physical Exam: Vitals:   02/11/23 0947 02/11/23 1230 02/11/23 1407 02/11/23 1600  BP: 120/79 120/80  116/72  Pulse: (!) 106 89  96  Resp: 16 19  20   Temp: 98.5 F (36.9 C)  98.3 F (36.8 C)   TempSrc: Oral  Oral   SpO2: 100% 100%  99%  Weight:      Height:        Physical Exam Constitutional:      General: She is  not in acute distress.    Comments: Chronically  ill-appearing  HENT:     Head: Normocephalic and atraumatic.     Nose: Nose normal. No congestion or rhinorrhea.     Mouth/Throat:     Mouth: Mucous membranes are dry.     Pharynx: Oropharynx is clear. No oropharyngeal exudate or posterior oropharyngeal erythema.  Eyes:     General: No scleral icterus.    Extraocular Movements: Extraocular movements intact.     Conjunctiva/sclera: Conjunctivae normal.     Pupils: Pupils are equal, round, and reactive to light.  Cardiovascular:     Rate and Rhythm: Regular rhythm. Tachycardia present.     Pulses: Normal pulses.     Heart sounds: Normal heart sounds. No murmur heard.    No friction rub. No gallop.  Pulmonary:     Effort: Pulmonary effort is normal. No respiratory distress.     Breath sounds: Normal breath sounds. No wheezing or rhonchi.  Abdominal:     General: There is no distension.     Palpations: Abdomen is soft.     Tenderness: There is no abdominal tenderness. There is no guarding or rebound.     Comments: Hypoactive bowel sounds  Musculoskeletal:        General: No swelling. Normal range of motion.  Skin:    General: Skin is warm and dry.  Neurological:     Mental Status: She is alert and oriented to person, place, and time.     Motor: Weakness present.     Comments: Decreased sensation in bilateral feet up to mid-foot. Sensation in bilateral hands appears to be intact.   Psychiatric:        Mood and Affect: Mood normal.        Behavior: Behavior normal.        Thought Content: Thought content normal.      Data Reviewed:     Latest Ref Rng & Units 02/11/2023   10:03 AM 02/09/2023    9:19 AM 01/23/2023   11:24 AM  CBC  WBC 4.0 - 10.5 K/uL 10.6  11.8  11.2   Hemoglobin 12.0 - 15.0 g/dL 9.4  8.8  8.4   Hematocrit 36.0 - 46.0 % 31.1  27.9  26.7   Platelets 150 - 400 K/uL 324  367  347       Latest Ref Rng & Units 02/11/2023   10:03 AM 02/09/2023    9:19 AM 01/23/2023   11:24 AM  CMP  Glucose 70 - 99 mg/dL  284  132  440   BUN 8 - 23 mg/dL 10  18  16    Creatinine 0.44 - 1.00 mg/dL 1.02  7.25  3.66   Sodium 135 - 145 mmol/L 131  133  134   Potassium 3.5 - 5.1 mmol/L 3.1  3.5  3.7   Chloride 98 - 111 mmol/L 95  94  97   CO2 22 - 32 mmol/L 23  26  28    Calcium 8.9 - 10.3 mg/dL 8.2  8.4  8.8   Total Protein 6.5 - 8.1 g/dL 6.5  6.3  6.0   Total Bilirubin <1.2 mg/dL 1.6  1.0  0.7   Alkaline Phos 38 - 126 U/L 87  82  88   AST 15 - 41 U/L 16  10  13    ALT 0 - 44 U/L 9  5  8     CT Head Wo Contrast  Result Date: 02/11/2023 CLINICAL DATA:  Mental status change with unknown cause. Out of sorts. EXAM: CT HEAD WITHOUT CONTRAST TECHNIQUE: Contiguous axial images were obtained from the base of the skull through the vertex without intravenous contrast. RADIATION DOSE REDUCTION: This exam was performed according to the departmental dose-optimization program which includes automated exposure control, adjustment of the mA and/or kV according to patient size and/or use of iterative reconstruction technique. COMPARISON:  Head CT 11/22/2022 FINDINGS: Brain: Left frontotemporal encephalomalacia which is dense. There is chart history of brain tumor and stroke. The brain is generally atrophic without superimposed acute infarct, hemorrhage, hydrocephalus, mass, or collection. Vascular: No hyperdense vessel or unexpected calcification. Skull: Unremarkable remote left pterional craniotomy. Sinuses/Orbits: Negative IMPRESSION: Stable head CT.  No acute finding. Left frontotemporal encephalomalacia and brain atrophy. Electronically Signed   By: Tiburcio Pea M.D.   On: 02/11/2023 10:58   DG Chest Portable 1 View  Result Date: 02/11/2023 CLINICAL DATA:  67 year old female with history of altered mental status. EXAM: PORTABLE CHEST 1 VIEW COMPARISON:  Chest x-ray 09/09/2022. FINDINGS: Right internal jugular single-lumen porta cath with tip terminating in the distal superior vena cava. Lung volumes are normal. No consolidative  airspace disease. No pleural effusions. No pneumothorax. Calcified granuloma in the right mid lung. No pulmonary nodule or mass noted. Pulmonary vasculature and the cardiomediastinal silhouette are within normal limits. Atherosclerosis in the thoracic aorta. IMPRESSION: 1.  No radiographic evidence of acute cardiopulmonary disease. 2. Aortic atherosclerosis. Electronically Signed   By: Trudie Reed M.D.   On: 02/11/2023 10:52     Assessment and Plan: No notes have been filed under this hospital service. Service: Hospitalist  Dehydration Orthostasis Sinus Tachycardia Orthostatic vitals positive in ED. Provided 1.5L via IV and she is sustaining oral intake now. Will provide another 0.5L via IV to help facilitate rehydration while continuing to encourage oral rehydration. Sinus tachycardia likely 2/2 dehydration -0.5L LR IV -encourage oral hydration -repeat orthostatics later today -fall precautions  Metabolic Encephalopathy - resolved Had an episode of confusion at home, but this has since resolved. She is AAOx3 on exam. Possibly 2/2 electrolyte derangements, immunotherapy, or CNS acting medications. Will resume home CNS medications given normal mentation on exam.  -delirium precautions -resume cymbalta, wellbutrin  Hypovolemic hyponatremia Mild Hypokalemia These may be secondary to immunotherapy (nivolumab). CMP with mild hypokalemia and mild hyponatremia. She is volume depleted on exam, likely hypovolemic hyponatremia so will reassess sodium tomorrow after fluid rehydration. Received oral potassium for repletion. -CMP tomorrow, replete electrolytes as needed  Metastatic anal cancer Chronic pain Peripheral neuropathy Concern whether her weakness and electrolyte derangements are secondary to nivolumab therapy. She will need to discuss this with Dr. Truett Perna at next visit. Can consider oncology consultation if remains inpatient. -on nivolumab, received 5th cycle on 11/14 -follows Dr.  Truett Perna (oncology) -resume home dilaudid prn for pain control -tylenol prn for mild pain -holding home gabapentin per patient request -miralax daily and senna-s at bedtime for mild constipation  Hyperthyroidism? Patient with thyroid labs performed 2 days ago showing normal TSH but elevated T4 (14.3). Thyroid abnormalities can be caused by immunotherapy, which is the likely culprit. Her current symptoms, however, do not appear to be related to hyperthyroidism so will avoid treating this today. -continue to monitor for symptoms of hyperthyroidism -manage outpatient   Anemia of chronic disease No reported bleeding per rectum over the past couple of days. She does have intermittent bleeding from anal cancer but appears stable given hgb is at baseline. -CBC tomorrow after rehydration  Hx of CVA HLD -not taking baby aspirin at home -on zetia and zocor at home, resumed on admission  Recurrent falls -fall precautions    Advance Care Planning:   Code Status: Do not attempt resuscitation (DNR) PRE-ARREST INTERVENTIONS DESIRED  Discussed with patient and daughter -- patient would not want to have CPR performed. She would be fine with limited measures for reversible causes but would not want to stay on a mechanical ventilator. Her wishes best correlate to DNR Pre-arrest interventions desired. CODE status updated.  Consults: none  Family Communication: spoke with daughter at bedside  Severity of Illness: The appropriate patient status for this patient is OBSERVATION. Observation status is judged to be reasonable and necessary in order to provide the required intensity of service to ensure the patient's safety. The patient's presenting symptoms, physical exam findings, and initial radiographic and laboratory data in the context of their medical condition is felt to place them at decreased risk for further clinical deterioration. Furthermore, it is anticipated that the patient will be medically  stable for discharge from the hospital within 2 midnights of admission.   Author: Briscoe Burns, MD 02/11/2023 5:33 PM  For on call review [email or web address].

## 2023-02-11 NOTE — ED Notes (Addendum)
Pt needed to sit down after standing BP.

## 2023-02-11 NOTE — ED Notes (Signed)
Patient transported to CT 

## 2023-02-11 NOTE — Hospital Course (Signed)
Generalized weakness Episode of confusion, resolved   Dehydration -positive orthostatics  Hx of anal cancer with metastasis -seen by Dr. Myrle Sheng -on Nivolumab (cycle 5 received on 11/14)

## 2023-02-11 NOTE — ED Provider Notes (Signed)
[place] EMERGENCY DEPARTMENT AT [facility] Provider Note   CSN: [id number] Arrival date & time: 02/11/23  4098     History  Chief Complaint  Patient presents with   Weakness   Altered Mental Status    Alyssa Deleon is a 67 y.o. female with past medical history of stroke, hyperlipidemia, neuropathy, breast cancer, metastatic anal cancer presenting to emergency room after waking up this morning confused. The patient remembers this incident and said she woke up and had to go to the bathroom, she was confused to where she was at her, what day it was, felt she couldn't feel the tips of bilateral fingers or the bottom of her feet. Patient's family in room confirms that she received phone call and she was acting "out of sorts" not speaking clearly, unable to get her words out, and complaining that she could not feel either of her hands or feet.  Patient denies any focal weakness, blurry vision change in vision headache, chest pain, nausea vomiting.  Reports she feels like she needs to have BM, otherwise asymptomatic.    Weakness Altered Mental Status Associated symptoms: weakness        Home Medications Prior to Admission medications   Medication Sig Start Date End Date Taking? Authorizing Provider  ALPRAZolam (XANAX) 1 MG tablet TAKE 2 TABLETS BY MOUTH EVERY  NIGHT AT BEDTIME AS NEEDED FOR  ANXIETY 09/20/22   Allwardt, Crist Infante, PA-C  Budeson-Glycopyrrol-Formoterol (BREZTRI AEROSPHERE) 160-9-4.8 MCG/ACT AERO Inhale 2 puffs into the lungs in the morning and at bedtime. Rinse mouth after use.  DAILY USE FOR COPD MAINTENANCE TREATMENT. 10/27/22   Allwardt, Arlyss Repress M, PA-C  buPROPion (WELLBUTRIN XL) 300 MG 24 hr tablet TAKE 1 TABLET BY MOUTH DAILY 03/18/22   Allwardt, Crist Infante, PA-C  Calcium Carbonate-Vitamin D 600-5 MG-MCG CAPS Take by mouth.    [provider]  denosumab (PROLIA) 60 MG/ML SOSY injection Inject 60 mg into the skin every 6 (six) months.    [provider]  diphenoxylate-atropine (LOMOTIL) 2.5-0.025 MG tablet Take 1 tablet by mouth 4 (four) times daily as needed for diarrhea or loose stools. Patient not taking: Reported on 01/09/2023 07/11/22   Ronny Bacon, PA-C  DULoxetine (CYMBALTA) 60 MG capsule TAKE 1 CAPSULE BY MOUTH DAILY 03/18/22   Allwardt, Crist Infante, PA-C  gabapentin (NEURONTIN) 300 MG capsule TAKE 1 CAPSULE BY MOUTH TWICE  DAILY Patient not taking: Reported on 02/09/2023 10/10/22   Allwardt, Crist Infante, PA-C  HYDROmorphone (DILAUDID) 2 MG tablet Take 1-2 tablets (2-4 mg total) by mouth every 4 (four) hours as needed for severe pain (pain score 7-10). 02/09/23   Ladene Artist, MD  loperamide (IMODIUM A-D) 2 MG tablet Take 2 mg by mouth 4 (four) times daily. Patient not taking: Reported on 01/09/2023    [provider]  magic mouthwash (nystatin, diphenhydrAMINE, alum & mag hydroxide) suspension mixture Swish and spit 5 mL by mouth as needed for mouth pain. Patient not taking: Reported on 11/30/2022 06/06/22     ondansetron (ZOFRAN) 8 MG tablet Take 1 tablet (8 mg total) by mouth every 8 (eight) hours as needed for nausea or vomiting. Patient not taking: Reported on 01/09/2023 06/03/22   Ladene Artist, MD  prochlorperazine (COMPAZINE) 10 MG tablet Take 1 tablet (10 mg total) by mouth every 6 (six) hours as needed for nausea or vomiting. Patient not taking: Reported on 01/09/2023 06/03/22   Ladene Artist, MD  traZODone [facility])  150 MG tablet Take 150 mg by mouth at bedtime. Patient not taking: Reported on 02/09/2023    [provider]  TYRVAYA 0.03 MG/ACT SOLN Place 1 spray into both nostrils 2 (two) times daily. 02/22/22   [provider]      Allergies    Hydrocodone-acetaminophen, Klonopin [clonazepam], Morphine sulfate, Rosuvastatin, Venlafaxine, Grass pollen(k-o-r-t-swt vern), Seasonal ic [cholestatin], Ezetimibe-simvastatin, and Sulfa antibiotics    Review of Systems   Review of  Systems  Neurological:  Positive for weakness.    Physical Exam Updated Vital Signs Ht 5\' 7"  (1.702 m)   Wt 52.2 kg   BMI 18.02 kg/m  Physical Exam Vitals and nursing note reviewed.  Constitutional:      General: She is not in acute distress.    Appearance: She is not toxic-appearing.  HENT:     Head: Normocephalic and atraumatic.  Eyes:     General: No scleral icterus.    Conjunctiva/sclera: Conjunctivae normal.  Cardiovascular:     Rate and Rhythm: Normal rate and regular rhythm.     Pulses: Normal pulses.     Heart sounds: Normal heart sounds.  Pulmonary:     Effort: Pulmonary effort is normal. No respiratory distress.     Breath sounds: Normal breath sounds.  Abdominal:     General: Abdomen is flat. Bowel sounds are normal.     Palpations: Abdomen is soft.     Tenderness: There is no abdominal tenderness.  Skin:    General: Skin is warm and dry.     Findings: No lesion.     Comments: Appears dry  Neurological:     General: No focal deficit present.     Mental Status: She is alert and oriented to person, place, and time. Mental status is at baseline.     Comments: Decrease sensation of bilateral feet. No focal weakness on lower extremity exam, no ataxia. Decreased sensation over right hand, without focal weakness, strong radial pulse, no ataxia. No nystagmus, A&O, no slurred speech.      ED Results / Procedures / Treatments   Labs (all labs ordered are listed, but only abnormal results are displayed) Labs Reviewed  COMPREHENSIVE METABOLIC PANEL - Abnormal; Notable for the following components:      Result Value   Sodium 131 (*)    Potassium 3.1 (*)    Chloride 95 (*)    Glucose, Bld 134 (*)    Calcium 8.2 (*)    Albumin 2.9 (*)    Total Bilirubin 1.6 (*)    All other components within normal limits  CBC - Abnormal; Notable for the following components:   WBC 10.6 (*)    RBC 3.09 (*)    Hemoglobin 9.4 (*)    HCT 31.1 (*)    MCV 100.6 (*)    All other  components within normal limits  URINALYSIS, ROUTINE W REFLEX MICROSCOPIC - Abnormal; Notable for the following components:   Color, Urine AMBER (*)    Bilirubin Urine SMALL (*)    Ketones, ur 80 (*)    Protein, ur 100 (*)    Bacteria, UA RARE (*)    All other components within normal limits  CBG MONITORING, ED - Abnormal; Notable for the following components:   Glucose-Capillary 115 (*)    All other components within normal limits  TROPONIN I (HIGH SENSITIVITY)  TROPONIN I (HIGH SENSITIVITY)    EKG EKG Interpretation Date/Time:  Saturday February 11 2023 10:08:10 EST Ventricular Rate:  95  PR Interval:  125 QRS Duration:  74 QT Interval:  331 QTC Calculation: 416 R Axis:   3  Text Interpretation: Sinus rhythm Ventricular premature complex Low voltage, precordial leads Nonspecific T abnormalities, diffuse leads No significant change since last tracing Confirmed by Lorre Nick ([id number]) on 02/11/2023 10:33:33 AM  Radiology CT Head Wo Contrast  Result Date: 02/11/2023 CLINICAL DATA:  Mental status change with unknown cause. Out of sorts. EXAM: CT HEAD WITHOUT CONTRAST TECHNIQUE: Contiguous axial images were obtained from the base of the skull through the vertex without intravenous contrast. RADIATION DOSE REDUCTION: This exam was performed according to the departmental dose-optimization program which includes automated exposure control, adjustment of the mA and/or kV according to patient size and/or use of iterative reconstruction technique. COMPARISON:  Head CT 11/22/2022 FINDINGS: Brain: Left frontotemporal encephalomalacia which is dense. There is chart history of brain tumor and stroke. The brain is generally atrophic without superimposed acute infarct, hemorrhage, hydrocephalus, mass, or collection. Vascular: No hyperdense vessel or unexpected calcification. Skull: Unremarkable remote left pterional craniotomy. Sinuses/Orbits: Negative IMPRESSION: Stable head CT.  No acute finding.  Left frontotemporal encephalomalacia and brain atrophy. Electronically Signed   By: Tiburcio Pea M.D.   On: 02/11/2023 10:58   DG Chest Portable 1 View  Result Date: 02/11/2023 CLINICAL DATA:  67 year old female with history of altered mental status. EXAM: PORTABLE CHEST 1 VIEW COMPARISON:  Chest x-ray 09/09/2022. FINDINGS: Right internal jugular single-lumen porta cath with tip terminating in the distal superior vena cava. Lung volumes are normal. No consolidative airspace disease. No pleural effusions. No pneumothorax. Calcified granuloma in the right mid lung. No pulmonary nodule or mass noted. Pulmonary vasculature and the cardiomediastinal silhouette are within normal limits. Atherosclerosis in the thoracic aorta. IMPRESSION: 1.  No radiographic evidence of acute cardiopulmonary disease. 2. Aortic atherosclerosis. Electronically Signed   By: Trudie Reed M.D.   On: 02/11/2023 10:52    Procedures Procedures    Medications Ordered in ED Medications - No data to display  ED Course/ Medical Decision Making/ A&P                                 Medical Decision Making Amount and/or Complexity of Data Reviewed Labs: ordered. Radiology: ordered.  Risk Prescription drug management. Decision regarding hospitalization.   Waylan Boga 67 y.o. presented today for confusion, generalized weakness. Working DDx that I considered at this time includes, but not limited to, CVA/TIA, arrhythmia, vertigo, medication s/e, orthostatic hypotension, electrolyte abnormalities, dehydration, URI, ACS, UTI, anemia   R/o DDx: These are considered less likely than current impression due to history of present illness, physical exam, lab/imaging findings   Review of prior external notes: None   Pmhx: stroke, hyperlipidemia, neuropathy, breast cancer, metastatic anal cancer  Unique Tests and My Interpretation:  CBC: mild leukocytosis at 10.6, Hgb 9.4 improved from prior recent labs  CMP: Na 131,  K 3.1, no AKI POC BG: 115 Trop: 5, repeat 4 UA: ketones 80   EKG: Rate, rhythm, axis, intervals all examined: sinus regular rate  Orthostatic vitals: positive with standing 94/64 with pulse 129   Imaging:  CXR: no cardiopulmonary pathology  CT of head: Stable head CT with Left frontotemporal encephalomalacia and brain atrophy    Problem List / ED Course / Critical interventions / Medication management  Patient reported to emergency room with generalized weakness.  Workup here shows low potassium, low sodium significant  ketones in the urine.  Patient appears dry and has significant generalized weakness and tachycardia upon standing.  Patient lives alone at home and does not feel comfortable going home secondary to her increased weakness.  Head CT without any changes or explanation for today's weakness, normal chest x-ray.  I feel her symptoms are likely secondary to significant dehydration.  I feel she would benefit from admission and observation.  Patient and family agree. I ordered medication including NS, potassium Reevaluation of the patient after these medicines showed that the patient stayed the same Patients vitals assessed. Upon arrival patient is hemodynamically stable, slightly tachycardic at 106. I have reviewed the patients home medicines and have made adjustments as needed  Consult: Hospitalist regarding HPI, imaging and labs who recommended:  Plan: Admit for significant generalized weakness and dehydration         Final Clinical Impression(s) / ED Diagnoses Final diagnoses:  Dehydration  Generalized weakness    Rx / DC Orders ED Discharge Orders     None         Reinaldo Raddle 02/11/23 1512    Lorre Nick, MD 02/12/23 [phone]

## 2023-02-11 NOTE — ED Provider Notes (Signed)
I provided a substantive portion of the care of this patient.  I personally made/approved the management plan for this patient and take responsibility for the patient management.  EKG Interpretation Date/Time:  Saturday February 11 2023 10:08:10 EST Ventricular Rate:  95 PR Interval:  125 QRS Duration:  74 QT Interval:  331 QTC Calculation: 416 R Axis:   3  Text Interpretation: Sinus rhythm Ventricular premature complex Low voltage, precordial leads Nonspecific T abnormalities, diffuse leads No significant change since last tracing Confirmed by Lorre Nick ([id number]) on 02/11/2023 10:33:33 AM   Patient is EKG per interpretation shows sinus rhythm.  Unchanged from prior.  Patient here today due to altered mental status.  Called EMS for that reason.  Questionable aphasia.  On exam here, patient is alert oriented x 3.  Labs and workup pending at this time   Lorre Nick, MD 02/11/23 1034

## 2023-02-12 ENCOUNTER — Observation Stay (HOSPITAL_COMMUNITY): Payer: Medicare Other

## 2023-02-12 DIAGNOSIS — C210 Malignant neoplasm of anus, unspecified: Secondary | ICD-10-CM

## 2023-02-12 DIAGNOSIS — I951 Orthostatic hypotension: Secondary | ICD-10-CM

## 2023-02-12 DIAGNOSIS — E876 Hypokalemia: Secondary | ICD-10-CM

## 2023-02-12 DIAGNOSIS — G9341 Metabolic encephalopathy: Secondary | ICD-10-CM

## 2023-02-12 DIAGNOSIS — R531 Weakness: Secondary | ICD-10-CM

## 2023-02-12 DIAGNOSIS — E871 Hypo-osmolality and hyponatremia: Secondary | ICD-10-CM | POA: Insufficient documentation

## 2023-02-12 DIAGNOSIS — D539 Nutritional anemia, unspecified: Secondary | ICD-10-CM

## 2023-02-12 DIAGNOSIS — E860 Dehydration: Secondary | ICD-10-CM

## 2023-02-12 LAB — URINE CULTURE

## 2023-02-12 LAB — IRON AND TIBC
Iron: 31 ug/dL (ref 28–170)
Saturation Ratios: 21 % (ref 10.4–31.8)
TIBC: 146 ug/dL — ABNORMAL LOW (ref 250–450)
UIBC: 115 ug/dL

## 2023-02-12 LAB — RAPID URINE DRUG SCREEN, HOSP PERFORMED
Amphetamines: NOT DETECTED
Barbiturates: NOT DETECTED
Benzodiazepines: NOT DETECTED
Cocaine: NOT DETECTED
Opiates: POSITIVE — AB
Tetrahydrocannabinol: NOT DETECTED

## 2023-02-12 LAB — BLOOD GAS, VENOUS
Acid-Base Excess: 5.2 mmol/L — ABNORMAL HIGH (ref 0.0–2.0)
Bicarbonate: 29.9 mmol/L — ABNORMAL HIGH (ref 20.0–28.0)
O2 Saturation: 54.5 %
Patient temperature: 37
pCO2, Ven: 44 mm[Hg] (ref 44–60)
pH, Ven: 7.44 — ABNORMAL HIGH (ref 7.25–7.43)
pO2, Ven: 34 mm[Hg] (ref 32–45)

## 2023-02-12 LAB — FERRITIN: Ferritin: 336 ng/mL — ABNORMAL HIGH (ref 11–307)

## 2023-02-12 LAB — AMMONIA: Ammonia: 17 umol/L (ref 9–35)

## 2023-02-12 LAB — COMPREHENSIVE METABOLIC PANEL
ALT: 9 U/L (ref 0–44)
AST: 11 U/L — ABNORMAL LOW (ref 15–41)
Albumin: 2.4 g/dL — ABNORMAL LOW (ref 3.5–5.0)
Alkaline Phosphatase: 67 U/L (ref 38–126)
Anion gap: 9 (ref 5–15)
BUN: 11 mg/dL (ref 8–23)
CO2: 25 mmol/L (ref 22–32)
Calcium: 8 mg/dL — ABNORMAL LOW (ref 8.9–10.3)
Chloride: 99 mmol/L (ref 98–111)
Creatinine, Ser: 0.53 mg/dL (ref 0.44–1.00)
GFR, Estimated: >60 mL/min (ref >60)
Glucose, Bld: 92 mg/dL (ref 70–99)
Potassium: 3.6 mmol/L (ref 3.5–5.1)
Sodium: 133 mmol/L — ABNORMAL LOW (ref 135–145)
Total Bilirubin: 1.1 mg/dL (ref <1.2)
Total Protein: 5.3 g/dL — ABNORMAL LOW (ref 6.5–8.1)

## 2023-02-12 LAB — CBC
HCT: 24.2 % — ABNORMAL LOW (ref 36.0–46.0)
Hemoglobin: 7.4 g/dL — ABNORMAL LOW (ref 12.0–15.0)
MCH: 30.7 pg (ref 26.0–34.0)
MCHC: 30.6 g/dL (ref 30.0–36.0)
MCV: 100.4 fL — ABNORMAL HIGH (ref 80.0–100.0)
Platelets: 265 10*3/uL (ref 150–400)
RBC: 2.41 MIL/uL — ABNORMAL LOW (ref 3.87–5.11)
RDW: 15.5 % (ref 11.5–15.5)
WBC: 8.3 10*3/uL (ref 4.0–10.5)
nRBC: 0 % (ref 0.0–0.2)

## 2023-02-12 LAB — HIV ANTIBODY (ROUTINE TESTING W REFLEX): HIV Screen 4th Generation wRfx: NONREACTIVE

## 2023-02-12 LAB — FOLATE: Folate: 2.8 ng/mL — ABNORMAL LOW (ref >5.9)

## 2023-02-12 LAB — GLUCOSE, CAPILLARY: Glucose-Capillary: 85 mg/dL (ref 70–99)

## 2023-02-12 LAB — VITAMIN B12: Vitamin B-12: 1204 pg/mL — ABNORMAL HIGH (ref 180–914)

## 2023-02-12 MED ORDER — GADOBUTROL 1 MMOL/ML IV SOLN
5.0000 mL | Freq: Once | INTRAVENOUS | Status: AC | PRN
  Administered 2023-02-12: 5 mL via INTRAVENOUS

## 2023-02-12 MED ORDER — GABAPENTIN 300 MG PO CAPS
300.0000 mg | ORAL_CAPSULE | Freq: Every day | ORAL | Status: DC
  Administered 2023-02-13 – 2023-02-15 (×3): 300 mg via ORAL
  Filled 2023-02-12 (×3): qty 1

## 2023-02-12 MED ORDER — ALPRAZOLAM 0.5 MG PO TABS
1.5000 mg | ORAL_TABLET | Freq: Every evening | ORAL | Status: DC | PRN
  Administered 2023-02-12 – 2023-02-14 (×3): 1.5 mg via ORAL
  Filled 2023-02-12 (×3): qty 3

## 2023-02-12 MED ORDER — ALPRAZOLAM 0.5 MG PO TABS: 0.5000 mg | ORAL_TABLET | Freq: Once | ORAL | Status: DC | PRN

## 2023-02-12 MED ORDER — SODIUM CHLORIDE 0.9 % IV SOLN: INTRAVENOUS | Status: AC

## 2023-02-12 NOTE — Assessment & Plan Note (Signed)
Obtaining PT evaluation

## 2023-02-12 NOTE — Progress Notes (Signed)
PROGRESS NOTE   Alyssa Deleon  GMW:[id number] DOB: [birth year] DOA: 02/11/2023 PCP: Bary Leriche, PA-C   Date of Service: the patient was seen and examined on 02/12/2023  Brief Narrative:  67 y.o. female with medical history significant of metastatic anal cancer receiving nivolumab (follows Dr. 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  Upon evaluation in the emergency department initial workup revealed no source of infection however patient was found to have positive orthostatics.  Due to concerns for volume depletion, generalized weakness and waxing and waning confusion the hospitalist group was then called to assess the patient for admission to the hospital.     Assessment & Plan Acute metabolic encephalopathy Persisting waxing waning bouts of confusion in the past several days Considering active malignancy obtaining MRI brain Obtaining B12, ammonia, VBG Recent TSH unremarkable No evidence of infection with unremarkable UA and chest x-ray Dehydration with hyponatremia Clinical evidence of volume depletion with evidence of mild hyponatremia Hydrating patient with intravenous isotonic fluids Monitoring sodium levels with serial chemistries Orthostatic hypotension Positive orthostatics in the emergency department Will repeat as patient is being volume resuscitated Generalized weakness Obtaining PT evaluation Hypokalemia Hypokalemia replaced Macrocytic anemia Obtaining vitamin B12, folate, iron panel No clinical evidence of bleeding  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 Truett Perna Receiving Nivolumab,  last dose 11/14      Subjective:  Patient complaining of generalized weakness.  According to daughter at the bedside patient has been experiencing intermittent bouts of confusion and what seems to be either delusions or hallucinations over the past several days.  Physical Exam:  Vitals:   02/11/23 1600 02/11/23 1755 02/12/23 0500 02/12/23 0851  BP: 116/72 100/77  104/71  Pulse: 96 (!) 108  (!) 101  Resp: 20 18  18   Temp:  98.8 F (37.1 C)  98.4 F (36.9 C)  TempSrc:  Oral  Oral  SpO2: 99% 99%  97%  Weight:   53.2 kg   Height:  5\' 7"  (1.702 m)      Constitutional: Awake alert and oriented x3, no associated distress.   Skin: no rashes, no lesions, poorskin turgor noted. Eyes: Pupils are equally reactive to light.  No evidence of scleral icterus or conjunctival pallor.  ENMT: dry mucous membranes noted.  Posterior pharynx clear of any exudate or lesions.   Respiratory: clear to auscultation bilaterally, no wheezing, no crackles. Normal respiratory effort. No accessory muscle use.  Cardiovascular: Regular rate and rhythm, no murmurs / rubs / gallops. No extremity edema. 2+ pedal pulses. No carotid bruits.  Abdomen: Abdomen is soft and nontender.  No evidence of intra-abdominal masses.  Positive bowel sounds noted in all quadrants.   Musculoskeletal: No joint deformity upper and lower extremities. Good ROM, no contractures. Normal muscle tone.    Data Reviewed:  I have personally reviewed and interpreted labs, imaging.  Significant findings are   CBC: Recent Labs  Lab 02/09/23 0919 02/11/23 1003 02/12/23 0538  WBC 11.8* 10.6* 8.3  NEUTROABS 10.4*  --   --   HGB 8.8* 9.4* 7.4*  HCT 27.9* 31.1* 24.2*  MCV 96.9 100.6* 100.4*  PLT 367 324 265   Basic Metabolic Panel: Recent Labs  Lab 02/09/23 0919 02/11/23 1003 02/12/23 0538  NA 133* 131* 133*  K 3.5 3.1* 3.6  CL 94* 95* 99  CO2 26 23 25   GLUCOSE 109* 134* 92  BUN 18 10 11   CREATININE 0.62 0.50 0.53  CALCIUM  8.4* 8.2* 8.0*   GFR: Estimated Creatinine Clearance: 55.7 mL/min (by C-G formula based on SCr of 0.53 mg/dL). Liver Function Tests: Recent Labs  Lab 02/09/23 0919 02/11/23 1003 02/12/23 0538  AST 10* 16 11*  ALT 5 9 9   ALKPHOS 82 87 67  BILITOT 1.0 1.6* 1.1  PROT 6.3* 6.5 5.3*  ALBUMIN 3.5 2.9* 2.4*      Code Status:  DNR.  Code status decision has been confirmed with: patient Family Communication: Daughter at the bedside who has been updated on plan of care   Severity of Illness:  The appropriate patient status for this patient is OBSERVATION. Observation status is judged to be reasonable and necessary in order to provide the required intensity of service to ensure the patient's safety. The patient's presenting symptoms, physical exam findings, and initial radiographic and laboratory data in the context of their medical condition is felt to place them at decreased risk for further clinical deterioration. Furthermore, it is anticipated that the patient will be medically stable for discharge from the hospital within 2 midnights of admission.   Time spent:  50 minutes  Author:  Marinda Elk MD  02/12/2023 9:34 AM

## 2023-02-12 NOTE — Assessment & Plan Note (Signed)
Persisting waxing waning bouts of confusion in the past several days Considering active malignancy obtaining MRI brain Obtaining B12, ammonia, VBG Recent TSH unremarkable No evidence of infection with unremarkable UA and chest x-ray

## 2023-02-12 NOTE — Assessment & Plan Note (Signed)
Hypokalemia replaced

## 2023-02-12 NOTE — Assessment & Plan Note (Signed)
Obtaining vitamin B12, folate, iron panel No clinical evidence of bleeding

## 2023-02-12 NOTE — Assessment & Plan Note (Signed)
Positive orthostatics in the emergency department Will repeat as patient is being volume resuscitated

## 2023-02-12 NOTE — Assessment & Plan Note (Signed)
Clinical evidence of volume depletion with evidence of mild hyponatremia Hydrating patient with intravenous isotonic fluids Monitoring sodium levels with serial chemistries

## 2023-02-12 NOTE — Assessment & Plan Note (Signed)
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 Truett Perna Receiving Nivolumab, last dose 11/14

## 2023-02-12 NOTE — Plan of Care (Signed)

## 2023-02-13 ENCOUNTER — Inpatient Hospital Stay (HOSPITAL_COMMUNITY)
Admit: 2023-02-13 | Discharge: 2023-02-13 | Disposition: A | Discharge status: Home / Self Care | Payer: Medicare Other | Attending: Internal Medicine | Admitting: Internal Medicine

## 2023-02-13 ENCOUNTER — Telehealth: Payer: Medicare Other | Admitting: Physician Assistant

## 2023-02-13 ENCOUNTER — Telehealth: Admit: 2023-02-13 | Payer: PRIVATE HEALTH INSURANCE | Attending: Adult Health | Primary: Internal Medicine

## 2023-02-13 DIAGNOSIS — N3001 Acute cystitis with hematuria: Secondary | ICD-10-CM

## 2023-02-13 DIAGNOSIS — R4182 Altered mental status, unspecified: Secondary | ICD-10-CM | POA: Diagnosis not present

## 2023-02-13 DIAGNOSIS — I341 Nonrheumatic mitral (valve) prolapse: Secondary | ICD-10-CM | POA: Diagnosis present

## 2023-02-13 DIAGNOSIS — I951 Orthostatic hypotension: Secondary | ICD-10-CM | POA: Diagnosis present

## 2023-02-13 DIAGNOSIS — K625 Hemorrhage of anus and rectum: Secondary | ICD-10-CM | POA: Diagnosis present

## 2023-02-13 DIAGNOSIS — J439 Emphysema, unspecified: Secondary | ICD-10-CM | POA: Diagnosis present

## 2023-02-13 DIAGNOSIS — E876 Hypokalemia: Secondary | ICD-10-CM | POA: Diagnosis present

## 2023-02-13 DIAGNOSIS — E861 Hypovolemia: Secondary | ICD-10-CM | POA: Diagnosis present

## 2023-02-13 DIAGNOSIS — R569 Unspecified convulsions: Secondary | ICD-10-CM | POA: Diagnosis not present

## 2023-02-13 DIAGNOSIS — E860 Dehydration: Secondary | ICD-10-CM | POA: Diagnosis present

## 2023-02-13 DIAGNOSIS — G319 Degenerative disease of nervous system, unspecified: Secondary | ICD-10-CM | POA: Diagnosis present

## 2023-02-13 DIAGNOSIS — Z87891 Personal history of nicotine dependence: Secondary | ICD-10-CM | POA: Diagnosis not present

## 2023-02-13 DIAGNOSIS — D638 Anemia in other chronic diseases classified elsewhere: Secondary | ICD-10-CM | POA: Diagnosis present

## 2023-02-13 DIAGNOSIS — C210 Malignant neoplasm of anus, unspecified: Secondary | ICD-10-CM | POA: Diagnosis present

## 2023-02-13 DIAGNOSIS — R531 Weakness: Secondary | ICD-10-CM | POA: Diagnosis present

## 2023-02-13 DIAGNOSIS — G928 Other toxic encephalopathy: Secondary | ICD-10-CM | POA: Diagnosis present

## 2023-02-13 DIAGNOSIS — F339 Major depressive disorder, recurrent, unspecified: Secondary | ICD-10-CM | POA: Diagnosis present

## 2023-02-13 DIAGNOSIS — G894 Chronic pain syndrome: Secondary | ICD-10-CM | POA: Diagnosis present

## 2023-02-13 DIAGNOSIS — E871 Hypo-osmolality and hyponatremia: Secondary | ICD-10-CM | POA: Diagnosis present

## 2023-02-13 DIAGNOSIS — E782 Mixed hyperlipidemia: Secondary | ICD-10-CM | POA: Diagnosis present

## 2023-02-13 DIAGNOSIS — E8809 Other disorders of plasma-protein metabolism, not elsewhere classified: Secondary | ICD-10-CM | POA: Diagnosis present

## 2023-02-13 DIAGNOSIS — C787 Secondary malignant neoplasm of liver and intrahepatic bile duct: Secondary | ICD-10-CM | POA: Diagnosis present

## 2023-02-13 DIAGNOSIS — Z23 Encounter for immunization: Secondary | ICD-10-CM | POA: Diagnosis present

## 2023-02-13 DIAGNOSIS — G9389 Other specified disorders of brain: Secondary | ICD-10-CM | POA: Diagnosis present

## 2023-02-13 DIAGNOSIS — R627 Adult failure to thrive: Secondary | ICD-10-CM | POA: Diagnosis present

## 2023-02-13 DIAGNOSIS — F22 Delusional disorders: Secondary | ICD-10-CM | POA: Diagnosis present

## 2023-02-13 DIAGNOSIS — Z66 Do not resuscitate: Secondary | ICD-10-CM | POA: Diagnosis present

## 2023-02-13 DIAGNOSIS — Z8249 Family history of ischemic heart disease and other diseases of the circulatory system: Secondary | ICD-10-CM | POA: Diagnosis not present

## 2023-02-13 LAB — CBC WITH DIFFERENTIAL/PLATELET
Abs Immature Granulocytes: 0.05 10*3/uL (ref 0.00–0.07)
Basophils Absolute: 0 10*3/uL (ref 0.0–0.1)
Basophils Relative: 0 %
Eosinophils Absolute: 0 10*3/uL (ref 0.0–0.5)
Eosinophils Relative: 0 %
HCT: 23.5 % — ABNORMAL LOW (ref 36.0–46.0)
Hemoglobin: 7.1 g/dL — ABNORMAL LOW (ref 12.0–15.0)
Immature Granulocytes: 1 %
Lymphocytes Relative: 9 %
Lymphs Abs: 0.5 10*3/uL — ABNORMAL LOW (ref 0.7–4.0)
MCH: 31.1 pg (ref 26.0–34.0)
MCHC: 30.2 g/dL (ref 30.0–36.0)
MCV: 103.1 fL — ABNORMAL HIGH (ref 80.0–100.0)
Monocytes Absolute: 0.5 10*3/uL (ref 0.1–1.0)
Monocytes Relative: 9 %
Neutro Abs: 4.4 10*3/uL (ref 1.7–7.7)
Neutrophils Relative %: 81 %
Platelets: 248 10*3/uL (ref 150–400)
RBC: 2.28 MIL/uL — ABNORMAL LOW (ref 3.87–5.11)
RDW: 15.7 % — ABNORMAL HIGH (ref 11.5–15.5)
WBC: 5.4 10*3/uL (ref 4.0–10.5)
nRBC: 0 % (ref 0.0–0.2)

## 2023-02-13 LAB — COMPREHENSIVE METABOLIC PANEL
ALT: 9 U/L (ref 0–44)
AST: 9 U/L — ABNORMAL LOW (ref 15–41)
Albumin: 2.3 g/dL — ABNORMAL LOW (ref 3.5–5.0)
Alkaline Phosphatase: 59 U/L (ref 38–126)
Anion gap: 10 (ref 5–15)
BUN: 9 mg/dL (ref 8–23)
CO2: 25 mmol/L (ref 22–32)
Calcium: 7.9 mg/dL — ABNORMAL LOW (ref 8.9–10.3)
Chloride: 102 mmol/L (ref 98–111)
Creatinine, Ser: 0.54 mg/dL (ref 0.44–1.00)
GFR, Estimated: >60 mL/min (ref >60)
Glucose, Bld: 73 mg/dL (ref 70–99)
Potassium: 3.4 mmol/L — ABNORMAL LOW (ref 3.5–5.1)
Sodium: 137 mmol/L (ref 135–145)
Total Bilirubin: 0.8 mg/dL (ref <1.2)
Total Protein: 4.8 g/dL — ABNORMAL LOW (ref 6.5–8.1)

## 2023-02-13 LAB — HEMOGLOBIN AND HEMATOCRIT, BLOOD
HCT: 25.3 % — ABNORMAL LOW (ref 36.0–46.0)
HCT: 23.1 % — ABNORMAL LOW (ref 36.0–46.0)
Hemoglobin: 7.8 g/dL — ABNORMAL LOW (ref 12.0–15.0)
Hemoglobin: 7 g/dL — ABNORMAL LOW (ref 12.0–15.0)

## 2023-02-13 LAB — GLUCOSE, CAPILLARY: Glucose-Capillary: 82 mg/dL (ref 70–99)

## 2023-02-13 LAB — MAGNESIUM: Magnesium: 1.9 mg/dL (ref 1.7–2.4)

## 2023-02-13 MED ORDER — NITROFURANTOIN MONOHYDRATE/MACROCRYSTALS 100 MG CAPSULE
100 | ORAL_CAPSULE | Freq: Two times a day (BID) | ORAL | 1 refills | Status: AC
Start: 2023-02-13 — End: ?

## 2023-02-13 MED ORDER — SODIUM CHLORIDE 0.9% FLUSH: 10.0000 mL | INTRAVENOUS | Status: DC | PRN

## 2023-02-13 MED ORDER — POTASSIUM CHLORIDE 20 MEQ PO PACK
40.0000 meq | PACK | Freq: Once | ORAL | Status: AC
  Administered 2023-02-13: 40 meq via ORAL
  Filled 2023-02-13: qty 2

## 2023-02-13 MED ORDER — SODIUM CHLORIDE 0.9% FLUSH
10.0000 mL | Freq: Two times a day (BID) | INTRAVENOUS | Status: DC
  Administered 2023-02-13 – 2023-02-14 (×4): 10 mL

## 2023-02-13 MED ORDER — CHLORHEXIDINE GLUCONATE CLOTH 2 % EX PADS
6.0000 | MEDICATED_PAD | Freq: Every day | CUTANEOUS | Status: DC
  Administered 2023-02-13 – 2023-02-15 (×3): 6 via TOPICAL

## 2023-02-13 NOTE — Evaluation (Signed)
Occupational Therapy Evaluation Patient Details Name: Alyssa Deleon MRN: [id number] DOB: Jul 23, 1955 Today's Date: 02/13/2023   History of Present Illness 67 y.o. female who presented  to the [facility] ED 02/11/23 via EMS with generalized weakness and an episode of confusion, noted positive orthostatics. PMH: metastatic anal cancer, history of left breast cancer status post left mastectomy, anemia of chronic disease, chronic pain, emphysema, MDD, hyperlipidemia, neuropathy, stroke, recurrent falls   Clinical Impression   Patient was admitted for above. Patient was living at home alone with PRN support from family with A for IADls. Patient reported being able to participate in ADLs at home with standard walker. Patients family present in room reporting they would support patient more at home to be able to transition home at time of d/c. Patient was educated on walker tray, reacher and grab bars for shower. Patient and family verbalized understanding. Patient and daughter agreeable to [facility] services. Patient would continue to benefit from skilled OT services at this time while admitted and after d/c to address noted deficits in order to improve overall safety and independence in ADLs.        If plan is discharge home, recommend the following: A little help with walking and/or transfers;A little help with bathing/dressing/bathroom;Direct supervision/assist for medications management;Assist for transportation;Help with stairs or ramp for entrance;Direct supervision/assist for financial management;Assistance with cooking/housework    Functional Status Assessment  Patient has had a recent decline in their functional status and demonstrates the ability to make significant improvements in function in a reasonable and predictable amount of time.  Equipment Recommendations  Other (comment) (walker tray)       Precautions / Restrictions Precautions Precautions: Fall Precaution Comments: monitor sats, HGB  low Restrictions Weight Bearing Restrictions: No      Mobility Bed Mobility Overal bed mobility: Independent                  Transfers Overall transfer level: Needs assistance Equipment used: Rolling walker (2 wheels) Transfers: Sit to/from Stand Sit to Stand: Contact guard assist                  Balance Overall balance assessment: Mild deficits observed, not formally tested           ADL either performed or assessed with clinical judgement   ADL Overall ADL's : Needs assistance/impaired Eating/Feeding: Supervision/ safety;Set up;Sitting Eating/Feeding Details (indicate cue type and reason): educated on walker tray for walker at home. daughter took picture of it and plans to order one. Grooming: Set up;Sitting   Upper Body Bathing: Sitting;Minimal assistance   Lower Body Bathing: Sitting/lateral leans;Moderate assistance Lower Body Bathing Details (indicate cue type and reason): simulated reaching task Upper Body Dressing : Minimal assistance;Sitting   Lower Body Dressing: Moderate assistance Lower Body Dressing Details (indicate cue type and reason): see above Toilet Transfer: Supervision/safety;Set up;Ambulation;Rolling walker (2 wheels) Toilet Transfer Details (indicate cue type and reason): taking steps on side of bed. nurse and patient endorse that patient was walking to the bathroom with supervision earlier with RW. Toileting- Clothing Manipulation and Hygiene: Contact guard assist;Sit to/from stand               Vision   Vision Assessment?: No apparent visual deficits            Pertinent Vitals/Pain Pain Assessment Pain Assessment: No/denies pain     Extremity/Trunk Assessment Upper Extremity Assessment Upper Extremity Assessment: Overall WFL for tasks assessed   Lower Extremity Assessment Lower Extremity  Assessment: Defer to PT evaluation RLE Sensation: history of peripheral neuropathy LLE Sensation: history of peripheral  neuropathy   Cervical / Trunk Assessment Cervical / Trunk Assessment: Normal   Communication Communication Communication: No apparent difficulties   Cognition Arousal: Alert Behavior During Therapy: WFL for tasks assessed/performed         General Comments: patient and family present with patient deferring to family in room to receive education                Home Living Family/patient expects to be discharged to:: Private residence Living Arrangements: Alone Available Help at Discharge: Family;Available PRN/intermittently Type of Home: Apartment Home Access: Stairs to enter Entrance Stairs-Number of Steps: 1   Home Layout: Two level;1/2 bath on main level Alternate Level Stairs-Number of Steps: 14 Alternate Level Stairs-Rails: Right;Left Bathroom Shower/Tub: Tub/shower unit         Home Equipment: Rollator (4 wheels)          Prior Functioning/Environment Prior Level of Function : Needs assist;History of Falls (last six months)       Physical Assist : ADLs (physical)       ADLs Comments: limited going  upstairs to shower, mostly sponge bath first level        OT Problem List: Decreased activity tolerance;Impaired balance (sitting and/or standing);Decreased safety awareness;Decreased coordination;Decreased knowledge of precautions      OT Treatment/Interventions: Self-care/ADL training;Therapeutic exercise;DME and/or AE instruction;Therapeutic activities;Patient/family education    OT Goals(Current goals can be found in the care plan section) Acute Rehab OT Goals Patient Stated Goal: to go home OT Goal Formulation: With patient/family Time For Goal Achievement: 02/27/23 Potential to Achieve Goals: Fair  OT Frequency: Min 1X/week       AM-PAC OT "6 Clicks" Daily Activity     Outcome Measure Help from another person eating meals?: None Help from another person taking care of personal grooming?: A Little Help from another person toileting, which  includes using toliet, bedpan, or urinal?: A Little Help from another person bathing (including washing, rinsing, drying)?: A Little Help from another person to put on and taking off regular upper body clothing?: A Little Help from another person to put on and taking off regular lower body clothing?: A Little 6 Click Score: 19   End of Session Equipment Utilized During Treatment: Rolling walker (2 wheels) Nurse Communication: Mobility status  Activity Tolerance: Patient tolerated treatment well Patient left: in bed;with call bell/phone within reach;with family/visitor present;Other (comment) (EEG in room)  OT Visit Diagnosis: Unsteadiness on feet (R26.81);Other abnormalities of gait and mobility (R26.89);Muscle weakness (generalized) (M62.81)                Time: 1447-1500 OT Time Calculation (min): 13 min Charges:  OT General Charges $OT Visit: 1 Visit OT Evaluation $OT Eval Low Complexity: 1 Low  Kaniesha Barile OTR/L, MS Acute Rehabilitation Department Office# [phone]   Selinda Flavin 02/13/2023, 3:37 PM

## 2023-02-13 NOTE — Evaluation (Signed)
Physical Therapy Evaluation Patient Details Name: Alyssa Deleon MRN: [id number] DOB: 07-11-1955 Today's Date: 02/13/2023  History of Present Illness  66 y.o. female with medical history significant of metastatic anal cancer receiving nivolumab (follows Dr. Truett Perna, 5th cycle on11/14/2024), history of left breast cancer status post left mastectomy and TRAM, anemia of chronic disease, chronic pain, emphysema, MDD, hyperlipidemia, neuropathy, stroke, recurrent falls presenting to the [facility] ED 02/11/23 via EMS with generalized weakness and an episode of confusion, noted positive orthostatics.  Clinical Impression  Pt admitted with above diagnosis.  Pt currently with functional limitations due to the deficits listed below (see PT Problem List). Pt will benefit from acute skilled PT to increase their independence and safety with mobility to allow discharge.     The patient  is reclined on the bed, visiting with niece.  The patient initially stating that she was not ready for PT, Patient given explanation of role  of  PT for mobility to ensure  strength  while in hospital. Patient agreeable to ambulate in room and into hall using  RW x 110'  Patient resides in 2 level apartment, does not have 24./7 assistance. Patient will benefit from continued inpatient follow up therapy, <3 hours/day  unless patient has increased daily support.  SPO2 100%, BP 103/60 HR 104 after ambulation on RA. No reports of dizziness, noted HGB 7.1.        If plan is discharge home, recommend the following: A little help with walking and/or transfers;Assistance with cooking/housework;Assist for transportation;Help with stairs or ramp for entrance;A lot of help with bathing/dressing/bathroom   Can travel by private vehicle   Yes    Equipment Recommendations None recommended by PT  Recommendations for Other Services    OT   Functional Status Assessment Patient has had a recent decline in their functional status and  demonstrates the ability to make significant improvements in function in a reasonable and predictable amount of time.     Precautions / Restrictions Precautions Precautions: Fall Precaution Comments: monitor sats, HGB low Restrictions Weight Bearing Restrictions: No      Mobility  Bed Mobility Overal bed mobility: Independent                  Transfers Overall transfer level: Needs assistance Equipment used: Rolling walker (2 wheels) Transfers: Sit to/from Stand Sit to Stand: Contact guard assist, Min assist           General transfer comment: patient requires extra effort to stand from bed and min assist from low toilet surface, use of  rail    Ambulation/Gait Ambulation/Gait assistance: Min assist Gait Distance (Feet): 20 Feet (then 110) Assistive device: Rolling walker (2 wheels) Gait Pattern/deviations: Step-through pattern Gait velocity: decr     General Gait Details: gait slow, stop to rest x 2 standing  Stairs            Wheelchair Mobility     Tilt Bed    Modified Rankin (Stroke Patients Only)       Balance Overall balance assessment: Mild deficits observed, not formally tested                                           Pertinent Vitals/Pain Pain Assessment Pain Assessment: No/denies pain    Home Living Family/patient expects to be discharged to:: Private residence Living Arrangements: Alone Available Help at Discharge: Family;Available  PRN/intermittently Type of Home: Apartment Home Access: Stairs to enter   Entrance Stairs-Number of Steps: 1 Alternate Level Stairs-Number of Steps: 14 Home Layout: Two level;1/2 bath on main level Home Equipment: Rollator (4 wheels)      Prior Function Prior Level of Function : Needs assist;History of Falls (last six months)       Physical Assist : ADLs (physical)       ADLs Comments: limited going  upstairs to shower, mostly sponge bath first level      Extremity/Trunk Assessment   Upper Extremity Assessment Upper Extremity Assessment: Overall WFL for tasks assessed    Lower Extremity Assessment Lower Extremity Assessment: LLE deficits/detail;RLE deficits/detail RLE Sensation: history of peripheral neuropathy LLE Sensation: history of peripheral neuropathy    Cervical / Trunk Assessment Cervical / Trunk Assessment: Normal  Communication   Communication Communication: No apparent difficulties  Cognition Arousal: Alert Behavior During Therapy: WFL for tasks assessed/performed Overall Cognitive Status: Difficult to assess Area of Impairment: Safety/judgement                         Safety/Judgement: Decreased awareness of deficits     General Comments: patient initially was not amenable to "PT",   explained  concentration on safe mobility  for DC        General Comments      Exercises     Assessment/Plan    PT Assessment Patient needs continued PT services  PT Problem List Decreased strength;Decreased knowledge of precautions;Decreased mobility;Decreased activity tolerance;Decreased safety awareness;Pain;Decreased balance       PT Treatment Interventions DME instruction;Therapeutic activities;Gait training;Therapeutic exercise;Patient/family education;Stair training;Functional mobility training    PT Goals (Current goals can be found in the Care Plan section)  Acute Rehab PT Goals Patient Stated Goal: to get stronger before starting PT PT Goal Formulation: With patient/family Time For Goal Achievement: 02/27/23 Potential to Achieve Goals: Good    Frequency Min 1X/week     Co-evaluation               AM-PAC PT "6 Clicks" Mobility  Outcome Measure Help needed turning from your back to your side while in a flat bed without using bedrails?: None Help needed moving from lying on your back to sitting on the side of a flat bed without using bedrails?: None Help needed moving to and from a bed  to a chair (including a wheelchair)?: A Little Help needed standing up from a chair using your arms (e.g., wheelchair or bedside chair)?: A Little Help needed to walk in hospital room?: A Little Help needed climbing 3-5 steps with a railing? : A Lot 6 Click Score: 19    End of Session Equipment Utilized During Treatment: Gait belt Activity Tolerance: Patient tolerated treatment well Patient left: in bed;with call bell/phone within reach;with nursing/sitter in room;with family/visitor present Nurse Communication: Mobility status PT Visit Diagnosis: Unsteadiness on feet (R26.81);Muscle weakness (generalized) (M62.81);Difficulty in walking, not elsewhere classified (R26.2);Other symptoms and signs involving the nervous system (R29.898)    Time: 1191-4782 PT Time Calculation (min) (ACUTE ONLY): 21 min   Charges:   PT Evaluation $PT Eval Low Complexity: 1 Low   PT General Charges $$ ACUTE PT VISIT: 1 Visit         Blanchard Kelch PT Acute Rehabilitation Services Office [phone] Weekend [phone]   Rada Hay 02/13/2023, 1:18 PM

## 2023-02-13 NOTE — Procedures (Signed)
[name]  MRN: [id number]  Epilepsy Attending: Charlsie Quest  Referring Physician/Provider: Marinda Elk, MD  Date: 02/13/2023 Duration: 25.14 mins  Patient history: 67 yo F with ams getting eeg to evaluate for seizure  Level of alertness: Awake  AEDs during EEG study: None  Technical aspects: This EEG study was done with scalp electrodes positioned according to the 10-20 International system of electrode placement. Electrical activity was reviewed with band pass filter of 1-70Hz , sensitivity of 7 uV/mm, display speed of 68mm/sec with a 60Hz  notched filter applied as appropriate. EEG data were recorded continuously and digitally stored.  Video monitoring was available and reviewed as appropriate.  Description: The posterior dominant rhythm consists of 8-9 Hz activity of moderate voltage (25-35 uV) seen predominantly in posterior head regions, symmetric and reactive to eye opening and eye closing. Physiologic photic driving was seen during photic stimulation.  Hyperventilation was not performed.     IMPRESSION: This study is within normal limits. No seizures or epileptiform discharges were seen throughout the recording.  A normal interictal EEG does not exclude the diagnosis of epilepsy.  Lehman Whiteley Annabelle Harman

## 2023-02-13 NOTE — Progress Notes (Signed)
PROGRESS NOTE    Alyssa Deleon  CZY:[id number] DOB: 05-Mar-1956 DOA: 02/11/2023 PCP: Bary Leriche, PA-C   Brief Narrative:  This 67 yrs old female with PMH significant of metastatic anal cancer receiving nivolumab (follows  with Dr. Truett Perna, 5th cycle on 02/09/2023), history of left breast cancer status post left mastectomy and TRAM, anemia of chronic disease, chronic pain, emphysema, MDD, hyperlipidemia, neuropathy, stroke, recurrent falls presenting to the [facility] ED with generalized weakness and an episode of confusion. Initial workup revealed no source of infection however patient was found to have positive orthostatics.  Due to concerns for volume depletion, generalized weakness and waxing and waning confusion, Patient was admitted for further evaluation.  Assessment & Plan:   Principal Problem:   Dehydration Active Problems:   Anal cancer (HCC)   Generalized weakness   Acute metabolic encephalopathy   Orthostatic hypotension   Macrocytic anemia   Hypokalemia   Dehydration with hyponatremia  Acute metabolic encephalopathy: Patient presented with waxing and waning bouts of confusion in the past several days. Considering active malignancy, MRI showed no acute intracranial abnormality, No metastatic disease. Folate is low, ammonia level and vitamin B12 appropriate. Recent TSH unremarkable No evidence of infection with unremarkable UA and chest x-ray. Patient seems improved, following commands.  Dehydration with hyponatremia: Clinical evidence of volume depletion with evidence of mild hyponatremia. Hydrating patient with intravenous isotonic fluids Monitoring sodium levels with serial chemistries.  Orthostatic hypotension: Positive orthostatics in the emergency department. Will repeat as patient is being volume resuscitated.  Generalized weakness: PT and OT recommended SNF but patient and family refused.   Planning for home health PT and OT  Hypokalemia: Replaced  and resolved.  Macrocytic anemia: B12 is normal iron is appropriate, folate is low. No clinical evidence of bleeding. Start folic acid.   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 Truett Perna Receiving Nivolumab, last dose 11/14     DVT prophylaxis: Lovenox Code Status: DNR Family Communication: No family at bed side Disposition Plan:     Status is: Inpatient Remains inpatient appropriate because: Admitted for Altered mentation, workup in progress.    Consultants:  None  Procedures:  Antimicrobials:  Anti-infectives (From admission, onward)    None      Subjective: Patient was seen and examined at bedside.  Overnight events noted.   Patient reports doing much better,  still feels weak and tired.  Objective: Vitals:   02/13/23 0547 02/13/23 0548 02/13/23 0555 02/13/23 1403  BP: 93/67   106/67  Pulse: 98   95  Resp:  17  16  Temp:    98.3 F (36.8 C)  TempSrc:    Oral  SpO2: 98%   97%  Weight:   54.3 kg   Height:        Intake/Output Summary (Last 24 hours) at 02/13/2023 1455 Last data filed at 02/13/2023 1000 Gross per 24 hour  Intake 1136.23 ml  Output 1150 ml  Net -13.77 ml   Filed Weights   02/11/23 0940 02/12/23 0500 02/13/23 0555  Weight: 52.2 kg 53.2 kg 54.3 kg    Examination:  General exam: Appears calm and comfortable, deconditioned, not in any acute distress. Respiratory system: Clear to auscultation. Respiratory effort normal. RR 13 Cardiovascular system: S1 & S2 heard, RRR. No JVD, murmurs, rubs, gallops or clicks. No pedal edema. Gastrointestinal system: Abdomen is nondistended, soft and nontender.  Normal bowel  sounds heard. Central nervous system: Alert and oriented X 3 . No focal neurological deficits. Extremities: No edema, no cyanosis, no clubbing Skin: No rashes, lesions or ulcers Psychiatry: Judgement and insight  appear normal. Mood & affect appropriate.     Data Reviewed: I have personally reviewed following labs and imaging studies  CBC: Recent Labs  Lab 02/09/23 0919 02/11/23 1003 02/12/23 0538 02/13/23 0524 02/13/23 1215  WBC 11.8* 10.6* 8.3 5.4  --   NEUTROABS 10.4*  --   --  4.4  --   HGB 8.8* 9.4* 7.4* 7.1* 7.8*  HCT 27.9* 31.1* 24.2* 23.5* 25.3*  MCV 96.9 100.6* 100.4* 103.1*  --   PLT 367 324 265 248  --    Basic Metabolic Panel: Recent Labs  Lab 02/09/23 0919 02/11/23 1003 02/12/23 0538 02/13/23 0524  NA 133* 131* 133* 137  K 3.5 3.1* 3.6 3.4*  CL 94* 95* 99 102  CO2 26 23 25 25   GLUCOSE 109* 134* 92 73  BUN 18 10 11 9   CREATININE 0.62 0.50 0.53 0.54  CALCIUM 8.4* 8.2* 8.0* 7.9*  MG  --   --   --  1.9   GFR: Estimated Creatinine Clearance: 56.9 mL/min (by C-G formula based on SCr of 0.54 mg/dL). Liver Function Tests: Recent Labs  Lab 02/09/23 0919 02/11/23 1003 02/12/23 0538 02/13/23 0524  AST 10* 16 11* 9*  ALT 5 9 9 9   ALKPHOS 82 87 67 59  BILITOT 1.0 1.6* 1.1 0.8  PROT 6.3* 6.5 5.3* 4.8*  ALBUMIN 3.5 2.9* 2.4* 2.3*   No results for input(s): "LIPASE", "AMYLASE" in the last 168 hours. Recent Labs  Lab 02/12/23 1201  AMMONIA 17   Coagulation Profile: No results for input(s): "INR", "PROTIME" in the last 168 hours. Cardiac Enzymes: No results for input(s): "CKTOTAL", "CKMB", "CKMBINDEX", "TROPONINI" in the last 168 hours. BNP (last 3 results) No results for input(s): "PROBNP" in the last 8760 hours. HbA1C: No results for input(s): "HGBA1C" in the last 72 hours. CBG: Recent Labs  Lab 02/11/23 1045 02/12/23 0740 02/13/23 0742  GLUCAP 115* 85 82   Lipid Profile: No results for input(s): "CHOL", "HDL", "LDLCALC", "TRIG", "CHOLHDL", "LDLDIRECT" in the last 72 hours. Thyroid Function Tests: No results for input(s): "TSH", "T4TOTAL", "FREET4", "T3FREE", "THYROIDAB" in the last 72 hours. Anemia Panel: Recent Labs    02/12/23 1201   VITAMINB12 1,204*  FOLATE 2.8*  FERRITIN 336*  TIBC 146*  IRON 31   Sepsis Labs: No results for input(s): "PROCALCITON", "LATICACIDVEN" in the last 168 hours.  No results found for this or any previous visit (from the past 240 hour(s)).  Radiology Studies: MR BRAIN W WO CONTRAST  Result Date: 02/12/2023 CLINICAL DATA:  Delirium waxing and waning delirium and headache in a patient with ongoing malignancy EXAM: MRI HEAD WITHOUT AND WITH CONTRAST TECHNIQUE: Multiplanar, multiecho pulse sequences of the brain and surrounding structures were obtained without and with intravenous contrast. CONTRAST:  5mL GADAVIST GADOBUTROL 1 MMOL/ML IV SOLN COMPARISON:  CT head February 11, 2023. FINDINGS: Brain: Remote left frontoparietal infarct with encephalomalacia and surrounding gliosis. Anterior left temporal lobe encephalomalacia with adjacent ex vacuo ventricular dilation. No evidence of acute infarct, acute hemorrhage, mass lesion, midline shift or hydrocephalus. Additional scattered T2/FLAIR hyperintensities the white matter compatible with chronic microvascular ischemic change. Cerebral atrophy. No pathologic enhancement. Vascular: Major arterial flow voids are maintained. Skull and upper cervical spine: Normal marrow signal. Sinuses/Orbits: Clear sinuses.  No acute orbital findings. Other: No  mastoid effusions. IMPRESSION: 1. No evidence of acute intracranial abnormality or metastatic disease. 2. Remote left frontoparietal and anterior temporal encephalomalacia, chronic microvascular ischemic disease, and cerebral atrophy (ICD10-G31.9). Electronically Signed   By: Feliberto Harts M.D.   On: 02/12/2023 14:14    Scheduled Meds:  buPROPion  300 mg Oral Daily   Chlorhexidine Gluconate Cloth  6 each Topical Daily   DULoxetine  60 mg Oral Daily   enoxaparin (LOVENOX) injection  40 mg Subcutaneous Q24H   ezetimibe  10 mg Oral Daily   feeding supplement  237 mL Oral BID BM   gabapentin  300 mg Oral Daily    influenza vaccine adjuvanted  0.5 mL Intramuscular Tomorrow-1000   phenylephrine  1 suppository Rectal BID   polyethylene glycol  17 g Oral Daily   senna-docusate  1 tablet Oral QHS   simvastatin  40 mg Oral QHS   sodium chloride flush  10-40 mL Intracatheter Q12H   Continuous Infusions:   LOS: 0 days    Time spent: 50 mins    Willeen Niece, MD [facility]   If 7PM-7AM, please contact night-coverage

## 2023-02-13 NOTE — Plan of Care (Signed)

## 2023-02-13 NOTE — TOC Initial Note (Signed)
Transition of Care [facility]) - Initial/Assessment Note    Patient Details  Name: Alyssa Deleon MRN: [id number] Date of Birth: Dec 15, 1955  Transition of [facility]) CM/SW Contact:    Otelia Santee, LCSW Phone Number: 02/13/2023, 3:20 PM  Clinical Narrative:                 Met with pt and daughter at bedside to discuss recommendation for SNF. Pt currently lives in an apartment by herself. Pt's daughter lives close by and provides support and care. Pt has a RW, BSC, and shower seat at home. Pt declines SNF placement at this time and prefers to return home with home health services. Pt does not have a preference for [facility]. Pt's daughter requested information for private caregivers to assist with pt bathing. Information for private caregiver agencies has been provided to pt's daughter.  CSW currently seeking [facility] to provide HHPT/OT.  Expected Discharge Plan: Home w Home Health Services Barriers to Discharge: No Barriers Identified   Patient Goals and CMS Choice Patient states their goals for this hospitalization and ongoing recovery are:: To return home CMS Medicare.gov Compare Post Acute Care list provided to:: Patient Choice offered to / list presented to : Patient [place] ownership interest in [facility].provided to::  (NA)    Expected Discharge Plan and Services In-house Referral: Clinical Social Work Discharge Planning Services: NA Post Acute Care Choice: Home Health Living arrangements for the past 2 months: Apartment                 DME Arranged: N/A DME Agency: NA                  Prior Living Arrangements/Services Living arrangements for the past 2 months: Apartment Lives with:: Self Patient language and need for interpreter reviewed:: Yes Do you feel safe going back to the place where you live?: Yes      Need for Family Participation in Patient Care: No (Comment) Care giver support system in place?: No (comment) Current home services: DME (RW,  BSC, shower seat) Criminal Activity/Legal Involvement Pertinent to Current Situation/Hospitalization: No - Comment as needed  Activities of Daily Living   ADL Screening (condition at time of admission) Independently performs ADLs?: No Does the patient have a NEW difficulty with bathing/dressing/toileting/self-feeding that is expected to last >3 days?: No Does the patient have a NEW difficulty with getting in/out of bed, walking, or climbing stairs that is expected to last >3 days?: No (needs standby) Does the patient have a NEW difficulty with communication that is expected to last >3 days?: No Is the patient deaf or have difficulty hearing?: No Does the patient have difficulty seeing, even when wearing glasses/contacts?: No Does the patient have difficulty concentrating, remembering, or making decisions?: No  Permission Sought/Granted Permission sought to share information with : Facility Medical sales representative, Family Supports Permission granted to share information with : Yes, Verbal Permission Granted  Share Information with NAME: Ander Purpura  Permission granted to share info w AGENCY: [facility]  Permission granted to share info w Relationship: Daughter  Permission granted to share info w Contact Information: [phone]  Emotional Assessment Appearance:: Appears older than stated age Attitude/Demeanor/Rapport: Engaged Affect (typically observed): Accepting Orientation: : Oriented to Self, Oriented to Place, Oriented to  Time, Oriented to Situation Alcohol / Substance Use: Not Applicable Psych Involvement: No (comment)  Admission diagnosis:  Dehydration [E86.0] Generalized weakness [R53.1] Patient Active Problem List   Diagnosis Date Noted  Dehydration 02/13/2023   Acute metabolic encephalopathy 02/12/2023   Orthostatic hypotension 02/12/2023   Macrocytic anemia 02/12/2023   Hypokalemia 02/12/2023   Dehydration with hyponatremia 02/12/2023   Generalized weakness 02/11/2023    Incontinence of feces with fecal urgency 10/30/2022   Insomnia due to medical condition 05/26/2022   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   Age-related osteoporosis without current pathological fracture 03/10/2022   Neuropathy 01/28/2022   Positive ANA (antinuclear antibody) 01/28/2022   Recurrent falls 01/28/2022   Aortic atherosclerosis (HCC) 10/29/2020   Emphysema lung (HCC) 10/29/2020   Genetic testing 03/27/2018   Personal history of breast cancer    Bilateral lower extremity edema 02/03/2018   Epidermoid cyst of skin 02/03/2018   B12 deficiency 02/03/2018   Mixed hyperlipidemia, on Zetia and Zocor 09/30/2017   Chronic pain syndrome, on Neurontin 09/30/2017   Vitamin D deficiency 09/30/2017   Major depression, recurrent, chronic (HCC), followed by Psych, on Wellbutrin, Cymbalta 09/30/2017   Panic disorder, followed by Psych, on Xanax 09/30/2017   Dry eye syndrome of both eyes, on Restasis 09/30/2017   History of stroke, on ASA 09/30/2017   Cataract, left 09/29/2017   Knee osteonecrosis, 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   Chronic low back pain 05/24/2017   MVP (mitral valve prolapse) 03/13/2015   GAD (generalized anxiety disorder) 03/11/2015   PTSD (post-traumatic stress disorder) 03/11/2015   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 [place] [address] Phone: [phone] Fax: [phone]     Social Determinants of Health (SDOH) Social History: SDOH Screenings   Food Insecurity: No Food Insecurity (02/11/2023)  Housing: Low Risk  (02/11/2023)  Transportation Needs: No Transportation Needs (02/11/2023)  Utilities: Not At Risk (02/11/2023)  Depression (PHQ2-9): Low Risk  (09/13/2022)   Financial Resource Strain: Medium Risk (09/10/2022)  Physical Activity: Unknown (09/10/2022)  Social Connections: Socially Isolated (09/10/2022)  Stress: Stress Concern Present (09/10/2022)  Tobacco Use: Medium Risk (02/11/2023)   SDOH Interventions:     Readmission Risk Interventions    02/13/2023    3:19 PM  Readmission Risk Prevention Plan  Transportation Screening Complete  PCP or Specialist Appt within 5-7 Days Complete  Home Care Screening Complete  Medication Review (RN CM) Complete

## 2023-02-13 NOTE — Progress Notes (Signed)
EEG complete - results pending 

## 2023-02-13 NOTE — Telephone Encounter
 Called patient to discuss results of UA/culture from Friday. After initially refusing to give a specimen she relented and did provide it. Culture came back positive for infection - 50,000-99,000 CFU/mL Escherichia coli Abnormal    Will start macrobid BID x 10 days, prescription provided. No answer on patients phone, called her daughter [name] and relayed the above. Will follow up in clinic as planned, knows to call back sooner with any questions or concerns.

## 2023-02-14 DIAGNOSIS — E860 Dehydration: Secondary | ICD-10-CM | POA: Diagnosis not present

## 2023-02-14 LAB — BASIC METABOLIC PANEL
Anion gap: 10 (ref 5–15)
BUN: 10 mg/dL (ref 8–23)
CO2: 25 mmol/L (ref 22–32)
Calcium: 8 mg/dL — ABNORMAL LOW (ref 8.9–10.3)
Chloride: 101 mmol/L (ref 98–111)
Creatinine, Ser: 0.7 mg/dL (ref 0.44–1.00)
GFR, Estimated: >60 mL/min (ref >60)
Glucose, Bld: 94 mg/dL (ref 70–99)
Potassium: 3.6 mmol/L (ref 3.5–5.1)
Sodium: 136 mmol/L (ref 135–145)

## 2023-02-14 LAB — CORTISOL: Cortisol, Plasma: 13.5 ug/dL

## 2023-02-14 LAB — MAGNESIUM: Magnesium: 1.9 mg/dL (ref 1.7–2.4)

## 2023-02-14 LAB — PREPARE RBC (CROSSMATCH)

## 2023-02-14 LAB — PHOSPHORUS: Phosphorus: 3.2 mg/dL (ref 2.5–4.6)

## 2023-02-14 MED ORDER — SODIUM CHLORIDE 0.9% IV SOLUTION: Freq: Once | INTRAVENOUS | Status: AC

## 2023-02-14 MED ORDER — IOHEXOL 300 MG/ML  SOLN: 30.0000 mL | Freq: Once | INTRAMUSCULAR | Status: DC | PRN

## 2023-02-14 MED ORDER — FOLIC ACID 1 MG PO TABS
1.0000 mg | ORAL_TABLET | Freq: Every day | ORAL | Status: DC
  Administered 2023-02-14 – 2023-02-15 (×2): 1 mg via ORAL
  Filled 2023-02-14 (×2): qty 1

## 2023-02-14 NOTE — Progress Notes (Signed)
IP PROGRESS NOTE  Subjective:   Alyssa Deleon is well-known to me with a history of metastatic anal cancer.  She completed treatment with nivolumab on 02/09/2023.  She reports going to sleep the evening of treatment and not waking up until 02/11/2023.  She presented to the emergency room 02/11/2023 with confusion.  She reports hallucinations. No fever.  She continues to have rectal bleeding.  A head CT and brain MRI revealed no acute change.  A chest x-ray was negative for signs of pneumonia. She reports feeling better at present. She was noted to have orthostatic sign changes on hospital admission. Objective: Vital signs in last 24 hours: Blood pressure 102/67, pulse 88, temperature 98.4 F (36.9 C), resp. rate 18, height 5\' 7"  (1.702 m), weight 129 lb 3 oz (58.6 kg), SpO2 94%.  Intake/Output from previous day: 11/18 0701 - 11/19 0700 In: -  Out: 300 [Urine:300]  Physical Exam:  HEENT: No thrush Lungs: Clear bilaterally Cardiac: Regular rate and rhythm Abdomen: No hepatosplenomegaly, no mass Extremities: No leg edema Neurologic: Alert and oriented, follows commands, moves all extremities to command Skin: 1 cm pink raised firm lesion at the parieto-occipital scalp  Portacath/PICC-without erythema  Lab Results: Recent Labs    02/12/23 0538 02/13/23 0524 02/13/23 1215 02/13/23 1608  WBC 8.3 5.4  --   --   HGB 7.4* 7.1* 7.8* 7.0*  HCT 24.2* 23.5* 25.3* 23.1*  PLT 265 248  --   --     BMET Recent Labs    02/13/23 0524 02/14/23 0221  NA 137 136  K 3.4* 3.6  CL 102 101  CO2 25 25  GLUCOSE 73 94  BUN 9 10  CREATININE 0.54 0.70  CALCIUM 7.9* 8.0*    No results found for: "CEA1", "CEA", "MVH846", "CA125"  Studies/Results: EEG adult  Result Date: 02/13/2023 Charlsie Quest, MD     02/13/2023  7:39 PM [name] MRN: [id number] Epilepsy Attending: Charlsie Quest Referring Physician/Provider: Marinda Elk, MD Date: 02/13/2023 Duration:  25.14 mins Patient history: 67 yo F with ams getting eeg to evaluate for seizure Level of alertness: Awake AEDs during EEG study: None Technical aspects: This EEG study was done with scalp electrodes positioned according to the 10-20 International system of electrode placement. Electrical activity was reviewed with band pass filter of 1-70Hz , sensitivity of 7 uV/mm, display speed of 44mm/sec with a 60Hz  notched filter applied as appropriate. EEG data were recorded continuously and digitally stored.  Video monitoring was available and reviewed as appropriate. Description: The posterior dominant rhythm consists of 8-9 Hz activity of moderate voltage (25-35 uV) seen predominantly in posterior head regions, symmetric and reactive to eye opening and eye closing. Physiologic photic driving was seen during photic stimulation.  Hyperventilation was not performed.   IMPRESSION: This study is within normal limits. No seizures or epileptiform discharges were seen throughout the recording. A normal interictal EEG does not exclude the diagnosis of epilepsy. Priyanka Annabelle Harman    Medications: I have reviewed the patient's current medications.  Assessment/Plan:  Anal cancer Anal mass noted on digital exam and colonoscopy 05/09/2022-biopsy consistent with squamous cell carcinoma CTs 05/12/2022-no evidence of thoracic metastatic disease, calcified right pleural plaques, asymmetric thickening of the right posterior wall of the distal rectum/anal canal, no evidence of metastatic adenopathy in the abdomen or pelvis, 2 small hypodense left liver lesions favored to represent benign cysts PET scan 05/30/2022-intense hypermetabolic activity at the anus.  No evidence of metastatic  adenopathy.  Indeterminate small, mildly hypermetabolic lesion in the lateral segment of the left hepatic lobe. Radiation 06/06/2022-07/19/2022 Cycle one 5-FU/Mitomycin-C 06/06/2022 cycle two 5-FU/Mitomycin-C 07/04/2022 (5-FU dose reduced secondary to  diarrhea) MRI liver 06/15/2022-1 cm hypervascular mass in segment 2 of the left hepatic lobe, corresponds to the focus of hypermetabolism on recent PET-CT.  Imaging characteristics atypical for metastatic squamous cell carcinoma.  Follow-up MRI in 3 months Cycle two 5-FU/Mitomycin-C 07/04/2022 (5-FU dose reduced secondary to diarrhea) 08/29/2022 anoscopy, Dr. Maisie Fus, smaller posterior midline rectal mass approximately in the dentate line MRI liver 09/12/2022-enlargement of a complex cystic and solid-appearing enhancing lesion in segment 2 of the liver, 4 x 2.2 cm compared to 1.5 cm on previous study PET scan 09/23/2022-interval increase in size and tracer avidity of a solitary metastasis within the lateral segment of left hepatic lobe.  Tracer avid lesion involving the anus again noted, slightly decreased in size and tracer avidity. 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 Modesta Messing 10/10/2022, presented at multidisciplinary clinic-recommendation for 3 months of treatment with systemic therapy to address the liver followed by restaging scan and also MRI of the pelvis to assess the primary site  Cycle 1 Taxol/carboplatin 11/03/2022 12/07/2022 chemotherapy discontinued due to poor performance status Cycle 1 nivolumab 12/12/2022 Cycle 2 nivolumab 12/26/2022 Cycle 3 nivolumab 01/09/2023 Cycle 4 nivolumab 01/23/2023 Cycle 5 nivolumab 02/09/2023 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 Syncopal episode 11/19/2022-question if due to dehydration Anemia secondary to chronic disease and rectal bleeding Admission  02/11/2023 with altered mental status, orthostasis  Alyssa Deleon has metastatic anal cancer.  She was admitted with confusion and failure to thrive on 02/11/2023.  No clear explanation for her symptoms have been found.  There is no evidence for an infection.  I suspect the decline in her performance status is related to progression of the metastatic anal cancer.  She is scheduled for a restaging CT evaluation later this week.  I will order the CT to be performed while she is in the hospital.  The low folate level is likely related to an acute dietary change.  We will check an RBC folate.  Alyssa Deleon lives alone.  I am concerned it is no longer safe for her to live alone.  She may need nursing facility placement versus having a family member live with her.       LOS: 1 day   [name], MD   02/14/2023, 1:44 PM

## 2023-02-14 NOTE — TOC Progression Note (Signed)
Transition of Care [facility]) - Progression Note    Patient Details  Name: Alyssa Deleon MRN: [id number] Date of Birth: [birth year]  [facility]) CM/SW Contact  Otelia Santee, LCSW Phone Number: 02/14/2023, 9:47 AM  Clinical Narrative:    HHPT/OT has been arranged with [facility]. HH orders will need to be placed prior to discharge.    Expected Discharge Plan: Home w Home Health Services Barriers to Discharge: No Barriers Identified  Expected Discharge Plan and Services In-house Referral: Clinical Social Work Discharge Planning Services: NA Post Acute Care Choice: Home Health Living arrangements for the past 2 months: Apartment                 DME Arranged: N/A DME Agency: NA                   Social Determinants of Health (SDOH) Interventions SDOH Screenings   Food Insecurity: No Food Insecurity (02/11/2023)  Housing: Low Risk  (02/11/2023)  Transportation Needs: No Transportation Needs (02/11/2023)  Utilities: Not At Risk (02/11/2023)  Depression (PHQ2-9): Low Risk  (09/13/2022)  Financial Resource Strain: Medium Risk (09/10/2022)  Physical Activity: Unknown (09/10/2022)  Social Connections: Socially Isolated (09/10/2022)  Stress: Stress Concern Present (09/10/2022)  Tobacco Use: Medium Risk (02/11/2023)    Readmission Risk Interventions    02/13/2023    3:19 PM  Readmission Risk Prevention Plan  Transportation Screening Complete  PCP or Specialist Appt within 5-7 Days Complete  Home Care Screening Complete  Medication Review (RN CM) Complete

## 2023-02-14 NOTE — Progress Notes (Signed)
PROGRESS NOTE    Alyssa Deleon  XLK:[id number] DOB: 21-Jun-1955 DOA: 02/11/2023 PCP: Bary Leriche, PA-C   Brief Narrative:  This 67 yrs old female with PMH significant of metastatic anal cancer receiving nivolumab (follows  with Dr. Truett Perna, 5th cycle on 02/09/2023), history of left breast cancer status post left mastectomy and TRAM, anemia of chronic disease, chronic pain, emphysema, MDD, hyperlipidemia, neuropathy, stroke, recurrent falls presenting to the [facility] ED with generalized weakness and an episode of confusion. Initial workup revealed no source of infection however patient was found to have positive orthostatics.  Due to concerns for volume depletion, generalized weakness and waxing and waning confusion, Patient was admitted for further evaluation.  Assessment & Plan:   Principal Problem:   Dehydration Active Problems:   Anal cancer (HCC)   Generalized weakness   Acute metabolic encephalopathy   Orthostatic hypotension   Macrocytic anemia   Hypokalemia   Dehydration with hyponatremia  Acute metabolic encephalopathy: Patient presented with waxing and waning bouts of confusion in the past several days. Considering active malignancy, MRI showed no acute intracranial abnormality, No metastatic disease. Folate is low, ammonia level and vitamin B12 appropriate. Recent TSH unremarkable No evidence of infection with unremarkable UA and chest x-ray. Patient seems improved, following commands.  Dehydration with hyponatremia: Clinical evidence of volume depletion with evidence of mild hyponatremia. Hydrating patient with intravenous isotonic fluids Monitoring sodium levels with serial chemistries.  Orthostatic hypotension: Positive orthostatics in the emergency department. Will repeat as patient is being volume resuscitated.  Generalized weakness: PT and OT recommended SNF but patient and family refused.   Planning for home health PT and OT  Hypokalemia: Replaced  and resolved.  Macrocytic anemia: B12 is normal iron is appropriate, folate is low. No clinical evidence of bleeding. Start folic acid 1 mg daily. Hb 7.0 , Transfuse 1 unit PRBC.  Follow-up H&H   Anal cancer [facility]): Diagnosed 04/2022 revealing squamous cell carcinoma  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 Truett Perna Receiving Nivolumab, last dose 11/14. Dr. Myrle Sheng ordered CT abdomen and pelvis for restaging while she is in the hospital     DVT prophylaxis: Lovenox Code Status: DNR Family Communication: No family at bed side Disposition Plan:     Status is: Inpatient Remains inpatient appropriate because: Admitted for Altered mentation, workup in progress.    Consultants:  None  Procedures:  Antimicrobials:  Anti-infectives (From admission, onward)    None      Subjective: Patient was seen and examined at bedside.  Overnight events noted.   Patient reports doing much better,  still feels weak and tired.  Objective: Vitals:   02/13/23 1403 02/13/23 2001 02/14/23 0438 02/14/23 0500  BP: 106/67 105/66 102/67   Pulse: 95 96 88   Resp: 16 18 18    Temp: 98.3 F (36.8 C) 98.5 F (36.9 C) 98.4 F (36.9 C)   TempSrc: Oral Oral    SpO2: 97% 98% 94%   Weight:    58.6 kg  Height:        Intake/Output Summary (Last 24 hours) at 02/14/2023 1528 Last data filed at 02/14/2023 0947 Gross per 24 hour  Intake 10 ml  Output --  Net 10 ml   Filed Weights   02/12/23 0500 02/13/23 0555 02/14/23 0500  Weight: 53.2 kg 54.3 kg 58.6 kg    Examination:  General exam: Appears calm and comfortable, deconditioned, not in any acute distress. Respiratory system: Clear to auscultation.  Respiratory effort normal. RR 13 Cardiovascular system: S1 & S2 heard, RRR. No JVD, murmurs, rubs, gallops or clicks. No pedal edema. Gastrointestinal system: Abdomen is nondistended, soft and nontender.  Normal bowel sounds heard. Central nervous  system: Alert and oriented X 3 . No focal neurological deficits. Extremities: No edema, no cyanosis, no clubbing Skin: No rashes, lesions or ulcers Psychiatry: Judgement and insight appear normal. Mood & affect appropriate.     Data Reviewed: I have personally reviewed following labs and imaging studies  CBC: Recent Labs  Lab 02/09/23 0919 02/11/23 1003 02/12/23 0538 2023-03-07 0524 07-Mar-2023 1215 2023-03-07 1608  WBC 11.8* 10.6* 8.3 5.4  --   --   NEUTROABS 10.4*  --   --  4.4  --   --   HGB 8.8* 9.4* 7.4* 7.1* 7.8* 7.0*  HCT 27.9* 31.1* 24.2* 23.5* 25.3* 23.1*  MCV 96.9 100.6* 100.4* 103.1*  --   --   PLT 367 324 265 248  --   --    Basic Metabolic Panel: Recent Labs  Lab 02/09/23 0919 02/11/23 1003 02/12/23 0538 03/07/2023 0524 02/14/23 0221  NA 133* 131* 133* 137 136  K 3.5 3.1* 3.6 3.4* 3.6  CL 94* 95* 99 102 101  CO2 26 23 25 25 25   GLUCOSE 109* 134* 92 73 94  BUN 18 10 11 9 10   CREATININE 0.62 0.50 0.53 0.54 0.70  CALCIUM 8.4* 8.2* 8.0* 7.9* 8.0*  MG  --   --   --  1.9 1.9  PHOS  --   --   --   --  3.2   GFR: Estimated Creatinine Clearance: 61.4 mL/min (by C-G formula based on SCr of 0.7 mg/dL). Liver Function Tests: Recent Labs  Lab 02/09/23 0919 02/11/23 1003 02/12/23 0538 2023/03/07 0524  AST 10* 16 11* 9*  ALT 5 9 9 9   ALKPHOS 82 87 67 59  BILITOT 1.0 1.6* 1.1 0.8  PROT 6.3* 6.5 5.3* 4.8*  ALBUMIN 3.5 2.9* 2.4* 2.3*   No results for input(s): "LIPASE", "AMYLASE" in the last 168 hours. Recent Labs  Lab 02/12/23 1201  AMMONIA 17   Coagulation Profile: No results for input(s): "INR", "PROTIME" in the last 168 hours. Cardiac Enzymes: No results for input(s): "CKTOTAL", "CKMB", "CKMBINDEX", "TROPONINI" in the last 168 hours. BNP (last 3 results) No results for input(s): "PROBNP" in the last 8760 hours. HbA1C: No results for input(s): "HGBA1C" in the last 72 hours. CBG: Recent Labs  Lab 02/11/23 1045 02/12/23 0740 2023/03/07 0742  GLUCAP 115*  85 82   Lipid Profile: No results for input(s): "CHOL", "HDL", "LDLCALC", "TRIG", "CHOLHDL", "LDLDIRECT" in the last 72 hours. Thyroid Function Tests: No results for input(s): "TSH", "T4TOTAL", "FREET4", "T3FREE", "THYROIDAB" in the last 72 hours. Anemia Panel: Recent Labs    02/12/23 1201  VITAMINB12 1,204*  FOLATE 2.8*  FERRITIN 336*  TIBC 146*  IRON 31   Sepsis Labs: No results for input(s): "PROCALCITON", "LATICACIDVEN" in the last 168 hours.  No results found for this or any previous visit (from the past 240 hour(s)).  Radiology Studies: EEG adult  Result Date: 03/07/23 Charlsie Quest, MD     03-07-23  7:39 PM [name] MRN: [id number] Epilepsy Attending: Charlsie Quest Referring Physician/Provider: Marinda Elk, MD Date: 2023-03-07 Duration: 25.14 mins Patient history: 67 yo F with ams getting eeg to evaluate for seizure Level of alertness: Awake AEDs during EEG study: None Technical aspects: This EEG study was  done with scalp electrodes positioned according to the 10-20 International system of electrode placement. Electrical activity was reviewed with band pass filter of 1-70Hz , sensitivity of 7 uV/mm, display speed of 27mm/sec with a 60Hz  notched filter applied as appropriate. EEG data were recorded continuously and digitally stored.  Video monitoring was available and reviewed as appropriate. Description: The posterior dominant rhythm consists of 8-9 Hz activity of moderate voltage (25-35 uV) seen predominantly in posterior head regions, symmetric and reactive to eye opening and eye closing. Physiologic photic driving was seen during photic stimulation.  Hyperventilation was not performed.   IMPRESSION: This study is within normal limits. No seizures or epileptiform discharges were seen throughout the recording. A normal interictal EEG does not exclude the diagnosis of epilepsy. Priyanka Annabelle Harman    Scheduled Meds:  sodium chloride   Intravenous  Once   buPROPion  300 mg Oral Daily   Chlorhexidine Gluconate Cloth  6 each Topical Daily   DULoxetine  60 mg Oral Daily   enoxaparin (LOVENOX) injection  40 mg Subcutaneous Q24H   ezetimibe  10 mg Oral Daily   feeding supplement  237 mL Oral BID BM   folic acid  1 mg Oral Daily   gabapentin  300 mg Oral Daily   influenza vaccine adjuvanted  0.5 mL Intramuscular Tomorrow-1000   phenylephrine  1 suppository Rectal BID   polyethylene glycol  17 g Oral Daily   senna-docusate  1 tablet Oral QHS   simvastatin  40 mg Oral QHS   sodium chloride flush  10-40 mL Intracatheter Q12H   Continuous Infusions:   LOS: 1 day    Time spent: 35 mins    Willeen Niece, MD [facility]   If 7PM-7AM, please contact night-coverage

## 2023-02-14 NOTE — Plan of Care (Signed)
Alert and oriented. Daughter remains at bedside.  Plan for blood transfusion today after blood is released.   Problem: Education: Goal: Knowledge of General Education information will improve Description: Including pain rating scale, medication(s)/side effects and non-pharmacologic comfort measures Outcome: Progressing   Problem: Health Behavior/Discharge Planning: Goal: Ability to manage health-related needs will improve Outcome: Progressing   Problem: Clinical Measurements: Goal: Ability to maintain clinical measurements within normal limits will improve Outcome: Progressing Goal: Will remain free from infection Outcome: Progressing Goal: Diagnostic test results will improve Outcome: Progressing

## 2023-02-14 NOTE — Plan of Care (Signed)
  Problem: Education: Goal: Knowledge of General Education information will improve Description: Including pain rating scale, medication(s)/side effects and non-pharmacologic comfort measures Outcome: Progressing   Problem: Coping: Goal: Level of anxiety will decrease Outcome: Progressing   Problem: Pain Management: Goal: General experience of comfort will improve Outcome: Progressing   Problem: Safety: Goal: Ability to remain free from injury will improve Outcome: Progressing   Problem: Skin Integrity: Goal: Risk for impaired skin integrity will decrease Outcome: Progressing

## 2023-02-15 ENCOUNTER — Telehealth: Payer: Self-pay | Admitting: Physician Assistant

## 2023-02-15 ENCOUNTER — Other Ambulatory Visit (HOSPITAL_BASED_OUTPATIENT_CLINIC_OR_DEPARTMENT_OTHER): Payer: Self-pay

## 2023-02-15 ENCOUNTER — Inpatient Hospital Stay (HOSPITAL_COMMUNITY): Payer: Medicare Other

## 2023-02-15 ENCOUNTER — Encounter: Payer: Self-pay | Admitting: Oncology

## 2023-02-15 DIAGNOSIS — E860 Dehydration: Secondary | ICD-10-CM | POA: Diagnosis not present

## 2023-02-15 LAB — CBC WITH DIFFERENTIAL/PLATELET
Abs Immature Granulocytes: 0.07 10*3/uL (ref 0.00–0.07)
Basophils Absolute: 0 10*3/uL (ref 0.0–0.1)
Basophils Relative: 0 %
Eosinophils Absolute: 0 10*3/uL (ref 0.0–0.5)
Eosinophils Relative: 0 %
HCT: 27.2 % — ABNORMAL LOW (ref 36.0–46.0)
Hemoglobin: 8.7 g/dL — ABNORMAL LOW (ref 12.0–15.0)
Immature Granulocytes: 1 %
Lymphocytes Relative: 8 %
Lymphs Abs: 0.6 10*3/uL — ABNORMAL LOW (ref 0.7–4.0)
MCH: 31.4 pg (ref 26.0–34.0)
MCHC: 32 g/dL (ref 30.0–36.0)
MCV: 98.2 fL (ref 80.0–100.0)
Monocytes Absolute: 0.6 10*3/uL (ref 0.1–1.0)
Monocytes Relative: 8 %
Neutro Abs: 6.1 10*3/uL (ref 1.7–7.7)
Neutrophils Relative %: 83 %
Platelets: 252 10*3/uL (ref 150–400)
RBC: 2.77 MIL/uL — ABNORMAL LOW (ref 3.87–5.11)
RDW: 16.2 % — ABNORMAL HIGH (ref 11.5–15.5)
WBC: 7.5 10*3/uL (ref 4.0–10.5)
nRBC: 0 % (ref 0.0–0.2)

## 2023-02-15 LAB — TYPE AND SCREEN
ABO/RH(D): A NEG
Antibody Screen: NEGATIVE
Unit division: 0

## 2023-02-15 LAB — FOLATE RBC
Folate, Hemolysate: 243 ng/mL
Folate, RBC: 992 ng/mL (ref >498)
Hematocrit: 24.5 % — ABNORMAL LOW (ref 34.0–46.6)

## 2023-02-15 LAB — BPAM RBC
Blood Product Expiration Date: 202412192359
ISSUE DATE / TIME: 202411192035
Unit Type and Rh: 600

## 2023-02-15 LAB — GLUCOSE, CAPILLARY: Glucose-Capillary: 72 mg/dL (ref 70–99)

## 2023-02-15 MED ORDER — IOHEXOL 300 MG/ML  SOLN: 30.0000 mL | Freq: Once | INTRAMUSCULAR | Status: DC | PRN

## 2023-02-15 MED ORDER — FOLIC ACID 1 MG PO TABS
1.0000 mg | ORAL_TABLET | Freq: Every day | ORAL | 0 refills | Status: AC
  Filled 2023-02-15: qty 30, 30d supply, fill #0

## 2023-02-15 MED ORDER — HEPARIN SOD (PORK) LOCK FLUSH 100 UNIT/ML IV SOLN
500.0000 [IU] | INTRAVENOUS | Status: AC | PRN
  Administered 2023-02-15: 500 [IU]

## 2023-02-15 MED ORDER — IOHEXOL 300 MG/ML  SOLN
100.0000 mL | Freq: Once | INTRAMUSCULAR | Status: AC | PRN
  Administered 2023-02-15: 100 mL via INTRAVENOUS

## 2023-02-15 NOTE — Telephone Encounter (Signed)
Please call pts daughter back, she wants to talk about her moms meds - she has a question

## 2023-02-15 NOTE — Progress Notes (Signed)
1st unit of blood has infused. No signs or symptoms of reactions. Pt. Tolerated well. Vital signs remain stable.

## 2023-02-15 NOTE — Telephone Encounter (Signed)
Returned pt call and spoke to [place] Pt daughter. Pt being discharged and doing much better. Hosp MD states meds may need adjustments from PCP since weight loss and could be causing some of her issues with disorientation etc. Pt was scheduled for VV on 12/2 but moved pt to 02/21/23 at 8:30 am.

## 2023-02-15 NOTE — Telephone Encounter (Signed)
Noted. Look forward to talking to them next week.

## 2023-02-15 NOTE — Progress Notes (Signed)
IP PROGRESS NOTE  Subjective:   Alyssa Deleon continues to feel better.  Her appetite has improved.  She continues to have rectal pain.  Her daughter is at the bedside.  She received 1 unit of packed red blood cells last night. Objective: Vital signs in last 24 hours: Blood pressure 116/82, pulse 93, temperature 98.3 F (36.8 C), resp. rate 16, height 5\' 7"  (1.702 m), weight 123 lb 3.8 oz (55.9 kg), SpO2 (!) 87%.  Intake/Output from previous day: 11/19 0701 - 11/20 0700 In: 348.4 [I.V.:10; Blood:338.4] Out: -   Physical Exam:   Abdomen: No hepatosplenomegaly, no mass nontender Extremities: No leg edema Neurologic: Alert and oriented, follows commands, moves all extremities to command Skin: 1 cm pink raised firm lesion at the parieto-occipital scalp central head?  Portacath/PICC-without erythema  Lab Results: Recent Labs    02/13/23 0524 02/13/23 1215 02/13/23 1608  WBC 5.4  --   --   HGB 7.1* 7.8* 7.0*  HCT 23.5* 25.3* 23.1*  PLT 248  --   --     BMET Recent Labs    02/13/23 0524 02/14/23 0221  NA 137 136  K 3.4* 3.6  CL 102 101  CO2 25 25  GLUCOSE 73 94  BUN 9 10  CREATININE 0.54 0.70  CALCIUM 7.9* 8.0*    No results found for: "CEA1", "CEA", "WUJ811", "CA125"  Studies/Results: EEG adult  Result Date: 02/13/2023 Charlsie Quest, MD     02/13/2023  7:39 PM [name] MRN: [id number] Epilepsy Attending: Charlsie Quest Referring Physician/Provider: Marinda Elk, MD Date: 02/13/2023 Duration: 25.14 mins Patient history: 67 yo F with ams getting eeg to evaluate for seizure Level of alertness: Awake AEDs during EEG study: None Technical aspects: This EEG study was done with scalp electrodes positioned according to the 10-20 International system of electrode placement. Electrical activity was reviewed with band pass filter of 1-70Hz , sensitivity of 7 uV/mm, display speed of 62mm/sec with a 60Hz  notched filter applied as appropriate. EEG  data were recorded continuously and digitally stored.  Video monitoring was available and reviewed as appropriate. Description: The posterior dominant rhythm consists of 8-9 Hz activity of moderate voltage (25-35 uV) seen predominantly in posterior head regions, symmetric and reactive to eye opening and eye closing. Physiologic photic driving was seen during photic stimulation.  Hyperventilation was not performed.   IMPRESSION: This study is within normal limits. No seizures or epileptiform discharges were seen throughout the recording. A normal interictal EEG does not exclude the diagnosis of epilepsy. Priyanka Annabelle Harman    Medications: I have reviewed the patient's current medications.  Assessment/Plan:  Anal cancer Anal mass noted on digital exam and colonoscopy 05/09/2022-biopsy consistent with squamous cell carcinoma CTs 05/12/2022-no evidence of thoracic metastatic disease, calcified right pleural plaques, asymmetric thickening of the right posterior wall of the distal rectum/anal canal, no evidence of metastatic adenopathy in the abdomen or pelvis, 2 small hypodense left liver lesions favored to represent benign cysts PET scan 05/30/2022-intense hypermetabolic activity at the anus.  No evidence of metastatic adenopathy.  Indeterminate small, mildly hypermetabolic lesion in the lateral segment of the left hepatic lobe. Radiation 06/06/2022-07/19/2022 Cycle one 5-FU/Mitomycin-C 06/06/2022 cycle two 5-FU/Mitomycin-C 07/04/2022 (5-FU dose reduced secondary to diarrhea) MRI liver 06/15/2022-1 cm hypervascular mass in segment 2 of the left hepatic lobe, corresponds to the focus of hypermetabolism on recent PET-CT.  Imaging characteristics atypical for metastatic squamous cell carcinoma.  Follow-up MRI in 3 months Cycle two  5-FU/Mitomycin-C 07/04/2022 (5-FU dose reduced secondary to diarrhea) 08/29/2022 anoscopy, Dr. Maisie Fus, smaller posterior midline rectal mass approximately in the dentate line MRI liver  09/12/2022-enlargement of a complex cystic and solid-appearing enhancing lesion in segment 2 of the liver, 4 x 2.2 cm compared to 1.5 cm on previous study PET scan 09/23/2022-interval increase in size and tracer avidity of a solitary metastasis within the lateral segment of left hepatic lobe.  Tracer avid lesion involving the anus again noted, slightly decreased in size and tracer avidity. 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 Modesta Messing 10/10/2022, presented at multidisciplinary clinic-recommendation for 3 months of treatment with systemic therapy to address the liver followed by restaging scan and also MRI of the pelvis to assess the primary site  Cycle 1 Taxol/carboplatin 11/03/2022 12/07/2022 chemotherapy discontinued due to poor performance status Cycle 1 nivolumab 12/12/2022 Cycle 2 nivolumab 12/26/2022 Cycle 3 nivolumab 01/09/2023 Cycle 4 nivolumab 01/23/2023 Cycle 5 nivolumab 02/09/2023 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  She was admitted with confusion and failure to thrive on 02/11/2023.  The cortisol level returned normal on 02/14/2023.  The etiology of the altered mental status is unclear.  Her mental status appears at baseline today.  No source for infection has been identified.  It is possible the altered mental status was related to polypharmacy.  She is on multiple  indications which could cause somnolence and altered mental status.  I agree with recommendation to limit medications which have potential for altering her mental status.  I discussed the current situation with Alyssa Deleon and her daughter.  I feel it is no longer safe for her to live alone.  She reports 2 sisters will be moving in with her over the next few days.  Her daughter is also close by.  She will undergo a restaging CT Abdo/pelvis today.  We will contact her when the results are available.  She will return for a follow-up visit as scheduled next week.         LOS: 2 days   [name], MD   02/15/2023, 8:50 AM

## 2023-02-15 NOTE — Discharge Summary (Signed)
Physician Discharge Summary  KENYAH OLLER VWU:[id number] DOB: 1955-09-01 DOA: 02/11/2023  PCP: Bary Leriche, PA-C  Admit date: 02/11/2023 Discharge date: 02/15/2023 30 Day Unplanned Readmission Risk Score    Flowsheet Row ED to Hosp-Admission (Current) from 02/11/2023 in [facility]  30 Day Unplanned Readmission Risk Score (%) 23.26 Filed at 02/15/2023 1200       This score is the patient's risk of an unplanned readmission within 30 days of being discharged (0 -100%). The score is based on dignosis, age, lab data, medications, orders, and past utilization.   Low:  0-14.9   Medium: 15-21.9   High: 22-29.9   Extreme: 30 and above          Admitted From: Home Disposition: Home  Recommendations for Outpatient Follow-up:  Follow up with PCP in 1-2 weeks Please obtain BMP/CBC in one week Follow-up with your primary oncologist as per schedule. Please follow up with your PCP on the following pending results: Unresulted Labs (From admission, onward)     Start     Ordered   02/18/23 0500  Creatinine, serum  (enoxaparin (LOVENOX)    CrCl >/= 30 ml/min)  Weekly,   R     Comments: while on enoxaparin therapy    02/11/23 1543   02/15/23 0906  CBC with Differential/Platelet  Once,   R       Question:  Specimen collection method  Answer:  IV Team=IV Team collect   02/15/23 0905   02/14/23 1359  Folate RBC  Add-on,   AD       Question:  Specimen collection method  Answer:  IV Team=IV Team collect   02/14/23 1358              Home Health: Yes Equipment/Devices: None  Discharge Condition: Stable CODE STATUS: DNR Diet recommendation: Cardiac  Subjective: Seen and examined.  Daughter at the bedside.  Patient is fully alert and oriented and has no complaints and desires to go home.  Brief/Interim Summary: This 67 yrs old female with PMH significant of metastatic anal cancer receiving nivolumab (follows  with Dr. Truett Perna, 5th  cycle on 02/09/2023), history of left breast cancer status post left mastectomy and TRAM, anemia of chronic disease, chronic pain, emphysema, MDD, hyperlipidemia, neuropathy, stroke, recurrent falls [facility] with generalized weakness and an episode of confusion. Initial workup revealed no source of infection however patient was found to have positive orthostatics.  Due to concerns for volume depletion, generalized weakness and waxing and waning confusion, Patient was admitted for further evaluation.  Details as below.   Acute metabolic encephalopathy: Patient presented with waxing and waning bouts of confusion in the past several days. Considering active malignancy, MRI showed no acute intracranial abnormality, No metastatic disease. Folate is low, ammonia level and vitamin B12 appropriate. Recent TSH unremarkable No evidence of infection with unremarkable UA and chest x-ray.  Patient has improved, she is fully alert and oriented.  Reviewed her PTA medications.  She is on at least 5 medications which can cause altered mental status so polypharmacy seems to be likely cause of her acute encephalopathy.  Discussed this with the patient and the daughter at the bedside.  Stressed importance of reducing dosage or cut down some of the medications.  They were hesitant to do so in the hospital.  However they are going to discuss this with the PCP.  She told me that she is already on the tapering  dose of Xanax.   Dehydration with hyponatremia: Clinical evidence of volume depletion with evidence of mild hyponatremia. With hydration.   Orthostatic hypotension: Positive orthostatics in the emergency department.  No more orthostatic hypotension.   Generalized weakness: PT and OT recommended SNF but patient and family refused.  Per their request, she has been discharged home with home health.  Patient lives alone but one of her family member is going to be living with her for next few days and other  family members will be checking on her few times a day after that.   Hypokalemia: Replaced and resolved.   Macrocytic anemia: B12 is normal iron is appropriate, folate is low. No clinical evidence of bleeding. Start folic acid 1 mg daily. Hb 7.0 , Transfused 1 unit PRBC.    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 Truett Perna Receiving Nivolumab, last dose 11/14. Dr. Myrle Sheng ordered CT abdomen and pelvis for restaging while she is in the hospital and he will follow-up with the results and discussed with patient.  I have personally verified with him and he is in agreement with patient going home today.  Discharge plan was discussed with patient and/or family member and they verbalized understanding and agreed with it.  Discharge Diagnoses:  Principal Problem:   Dehydration Active Problems:   Anal cancer (HCC)   Generalized weakness   Acute metabolic encephalopathy   Orthostatic hypotension   Macrocytic anemia   Hypokalemia   Dehydration with hyponatremia    Discharge Instructions   Allergies as of 02/15/2023       Reactions   Hydrocodone-acetaminophen Shortness Of Breath, Other (See Comments)   If more than 2 a day are taken   Klonopin [clonazepam] Swelling, Other (See Comments)   Coating on the tablet caused swelling in the mouth   Morphine Sulfate Itching   Rosuvastatin Other (See Comments)   Myalgia and Muscle cramps   Venlafaxine Other (See Comments)   Weight gain   Grass Pollen(k-o-r-t-swt Vern) Itching, Other (See Comments)   Itchy eyes, runny nose   Seasonal Ic [cholestatin] Other (See Comments), Cough   Pollen = sinus issues, also   Ezetimibe-simvastatin Other (See Comments)   Myalgias- Causes cramps- cannot take in a combined form, but tolerates separately   Sulfa Antibiotics Diarrhea, Other (See Comments)   Stomach ache, too         Medication List     STOP taking these medications    diphenoxylate-atropine 2.5-0.025 MG tablet Commonly known as: LOMOTIL   ondansetron 8 MG tablet Commonly known as: ZOFRAN   traZODone 150 MG tablet Commonly known as: DESYREL       TAKE these medications    ALPRAZolam 1 MG tablet Commonly known as: XANAX TAKE 2 TABLETS BY MOUTH EVERY  NIGHT AT BEDTIME AS NEEDED FOR  ANXIETY What changed: See the new instructions.   Breztri Aerosphere 160-9-4.8 MCG/ACT Aero Generic drug: Budeson-Glycopyrrol-Formoterol Inhale 2 puffs into the lungs in the morning and at bedtime. Rinse mouth after use.  DAILY USE FOR COPD MAINTENANCE TREATMENT.   buPROPion 300 MG 24 hr tablet Commonly known as: WELLBUTRIN XL TAKE 1 TABLET BY MOUTH DAILY What changed: when to take this   CALCIUM + D3 PO Take 1 tablet by mouth daily with breakfast.   denosumab 60 MG/ML Sosy injection Commonly known as: PROLIA Inject 60 mg into the skin every 6 (six) months.  DULoxetine 60 MG capsule Commonly known as: CYMBALTA TAKE 1 CAPSULE BY MOUTH DAILY What changed: when to take this   ezetimibe 10 MG tablet Commonly known as: ZETIA Take 10 mg by mouth in the morning.   gabapentin 300 MG capsule Commonly known as: NEURONTIN TAKE 1 CAPSULE BY MOUTH TWICE  DAILY What changed: when to take this   HYDROmorphone 2 MG tablet Commonly known as: Dilaudid Take 1-2 tablets (2-4 mg total) by mouth every 4 (four) hours as needed for severe pain (pain score 7-10).   magic mouthwash (nystatin, diphenhydrAMINE, alum & mag hydroxide) suspension mixture Swish and spit 5 mL by mouth as needed for mouth pain.   simvastatin 40 MG tablet Commonly known as: ZOCOR Take 40 mg by mouth at bedtime.   TYLENOL 500 MG tablet Generic drug: acetaminophen Take 1,000 mg by mouth every 6 (six) hours as needed for mild pain (pain score 1-3) or headache.   Tyrvaya 0.03 MG/ACT Soln Generic drug: Varenicline Tartrate Place 1  spray into both nostrils in the morning.        Follow-up Information     [facility] Follow up.   Why: [facility] will follow up with you at discharge to provide home health physical and occupational therapies        Allwardt, Alyssa M, PA-C Follow up in 1 week(s).   Specialty: Physician Assistant Contact information: [address] [place] [address] [phone]                Allergies  Allergen Reactions   Hydrocodone-Acetaminophen Shortness Of Breath and Other (See Comments)    If more than 2 a day are taken   Klonopin [Clonazepam] Swelling and Other (See Comments)    Coating on the tablet caused swelling in the mouth   Morphine Sulfate Itching   Rosuvastatin Other (See Comments)    Myalgia and Muscle cramps    Venlafaxine Other (See Comments)    Weight gain   Grass Pollen(K-O-R-T-Swt Vern) Itching and Other (See Comments)    Itchy eyes, runny nose   Seasonal Ic [Cholestatin] Other (See Comments) and Cough    Pollen = sinus issues, also   Ezetimibe-Simvastatin Other (See Comments)    Myalgias- Causes cramps- cannot take in a combined form, but tolerates separately   Sulfa Antibiotics Diarrhea and Other (See Comments)    Stomach ache, too    Consultations: Oncology   Procedures/Studies: CT ABDOMEN PELVIS W CONTRAST  Result Date: 02/15/2023 CLINICAL DATA:  Metastatic anal cancer. Restaging. * Tracking Code: BO * EXAM: CT ABDOMEN AND PELVIS WITH CONTRAST TECHNIQUE: Multidetector CT imaging of the abdomen and pelvis was performed using the standard protocol following bolus administration of intravenous contrast. RADIATION DOSE REDUCTION: This exam was performed according to the departmental dose-optimization program which includes automated exposure control, adjustment of the mA and/or kV according to patient size and/or use of iterative reconstruction technique. CONTRAST:  OMNIPAQUE IOHEXOL 300 MG/ML  SOLN COMPARISON:  PET-CT 09/23/2022  FINDINGS: Lower chest: Stable calcified pleural plaque right middle lobe and posterior right lower lobe. Interval progression of collapse/consolidative opacity in the posterior right costophrenic sulcus, likely atelectasis or scarring although neoplasm not excluded. Tiny right pleural effusion evident. Hepatobiliary: Interval development of multiple ill-defined hepatic lesions consistent with metastatic disease. Most of these appear to be new since chest CTA of 11/22/2022 although bolus timing on the previous study likely hinders assessment. 2.2 cm lesion noted anterior hepatic dome on image 7/2. Index  lesion in the lateral segment left liver measures 3.4 cm on 15/2. intrahepatic biliary duct dilatation noted in the lateral segment left liver. 3 cm lesion identified inferior liver on 28/2. There is no evidence for gallstones, gallbladder wall thickening, or pericholecystic fluid. Mild diffuse intrahepatic biliary duct dilatation noted. Common bile duct measures 9 mm diameter in the head of the pancreas. Pancreas: No focal mass lesion. No dilatation of the main duct. No intraparenchymal cyst. No peripancreatic edema. Spleen: No splenomegaly. No suspicious focal mass lesion. Adrenals/Urinary Tract: No adrenal nodule or mass. Tiny well-defined homogeneous low-density lesions in both kidneys are too small to characterize but are statistically most likely benign and probably cysts. No followup imaging is recommended. No evidence for hydroureter. The urinary bladder appears normal for the degree of distention. Stomach/Bowel: Stomach is unremarkable. No gastric wall thickening. No evidence of outlet obstruction. Duodenum is normally positioned as is the ligament of Treitz. No small bowel wall thickening. No small bowel dilatation. The terminal ileum is normal. The appendix is normal. Wall thickening is noted in the rectum with 2.4 x 2.2 x 4.8 cm irregular hyperenhancing lesion in the low right rectum extending down towards  the anus. Vascular/Lymphatic: There is mild atherosclerotic calcification of the abdominal aorta without aneurysm. There is no gastrohepatic or hepatoduodenal ligament lymphadenopathy. No retroperitoneal or mesenteric lymphadenopathy. No pelvic sidewall lymphadenopathy. Reproductive: There is no adnexal mass. Other: Small volume free fluid seen in the abdomen and pelvis. Ill-defined somewhat nodular soft tissue density in the right omentum (42/2) may reflect edema although metastatic disease is not excluded. Musculoskeletal: Diffuse body wall edema evident. No worrisome lytic or sclerotic osseous abnormality. Superior endplate compression deformity noted at L2 and T12, similar to 05/12/2022 IMPRESSION: 1. Interval development of multiple ill-defined hepatic lesions in both lobes of the liver consistent with metastatic disease. Most of these appear to be new since chest CTA of 11/22/2022 although bolus timing on the previous study likely hinders assessment. PET-CT of 09/23/2022 revealed only 1 hypermetabolic liver lesion in the left hepatic lobe. 2. Wall thickening in the rectum with 2.4 x 2.2 x 4.8 cm irregular hyperenhancing lesion in the low right rectum extending down towards the anus. This is compatible with the patient's known primary anal neoplasm. 3. Small volume free fluid in the abdomen and pelvis. Ill-defined somewhat nodular soft tissue density in the right omentum may reflect edema although metastatic disease is not excluded. 4. Interval progression of collapse/consolidative opacity in the posterior right costophrenic sulcus, likely atelectasis although pneumonia or even neoplasm not excluded. Tiny right pleural effusion. 5. Diffuse body wall edema. 6.  Aortic Atherosclerosis (ICD10-I70.0). Electronically Signed   By: Kennith Center M.D.   On: 02/15/2023 12:56   EEG adult  Result Date: 02/13/2023 Charlsie Quest, MD     02/13/2023  7:39 PM [name] MRN: [id number] Epilepsy  Attending: Charlsie Quest Referring Physician/Provider: Marinda Elk, MD Date: 02/13/2023 Duration: 25.14 mins Patient history: 67 yo F with ams getting eeg to evaluate for seizure Level of alertness: Awake AEDs during EEG study: None Technical aspects: This EEG study was done with scalp electrodes positioned according to the 10-20 International system of electrode placement. Electrical activity was reviewed with band pass filter of 1-70Hz , sensitivity of 7 uV/mm, display speed of 30mm/sec with a 60Hz  notched filter applied as appropriate. EEG data were recorded continuously and digitally stored.  Video monitoring was available and reviewed as appropriate. Description: The posterior dominant rhythm consists  of 8-9 Hz activity of moderate voltage (25-35 uV) seen predominantly in posterior head regions, symmetric and reactive to eye opening and eye closing. Physiologic photic driving was seen during photic stimulation.  Hyperventilation was not performed.   IMPRESSION: This study is within normal limits. No seizures or epileptiform discharges were seen throughout the recording. A normal interictal EEG does not exclude the diagnosis of epilepsy. Charlsie Quest   MR BRAIN W WO CONTRAST  Result Date: 02/12/2023 CLINICAL DATA:  Delirium waxing and waning delirium and headache in a patient with ongoing malignancy EXAM: MRI HEAD WITHOUT AND WITH CONTRAST TECHNIQUE: Multiplanar, multiecho pulse sequences of the brain and surrounding structures were obtained without and with intravenous contrast. CONTRAST:  5mL GADAVIST GADOBUTROL 1 MMOL/ML IV SOLN COMPARISON:  CT head February 11, 2023. FINDINGS: Brain: Remote left frontoparietal infarct with encephalomalacia and surrounding gliosis. Anterior left temporal lobe encephalomalacia with adjacent ex vacuo ventricular dilation. No evidence of acute infarct, acute hemorrhage, mass lesion, midline shift or hydrocephalus. Additional scattered T2/FLAIR hyperintensities  the white matter compatible with chronic microvascular ischemic change. Cerebral atrophy. No pathologic enhancement. Vascular: Major arterial flow voids are maintained. Skull and upper cervical spine: Normal marrow signal. Sinuses/Orbits: Clear sinuses.  No acute orbital findings. Other: No mastoid effusions. IMPRESSION: 1. No evidence of acute intracranial abnormality or metastatic disease. 2. Remote left frontoparietal and anterior temporal encephalomalacia, chronic microvascular ischemic disease, and cerebral atrophy (ICD10-G31.9). Electronically Signed   By: Feliberto Harts M.D.   On: 02/12/2023 14:14   CT Head Wo Contrast  Result Date: 02/11/2023 CLINICAL DATA:  Mental status change with unknown cause. Out of sorts. EXAM: CT HEAD WITHOUT CONTRAST TECHNIQUE: Contiguous axial images were obtained from the base of the skull through the vertex without intravenous contrast. RADIATION DOSE REDUCTION: This exam was performed according to the departmental dose-optimization program which includes automated exposure control, adjustment of the mA and/or kV according to patient size and/or use of iterative reconstruction technique. COMPARISON:  Head CT 11/22/2022 FINDINGS: Brain: Left frontotemporal encephalomalacia which is dense. There is chart history of brain tumor and stroke. The brain is generally atrophic without superimposed acute infarct, hemorrhage, hydrocephalus, mass, or collection. Vascular: No hyperdense vessel or unexpected calcification. Skull: Unremarkable remote left pterional craniotomy. Sinuses/Orbits: Negative IMPRESSION: Stable head CT.  No acute finding. Left frontotemporal encephalomalacia and brain atrophy. Electronically Signed   By: Tiburcio Pea M.D.   On: 02/11/2023 10:58   DG Chest Portable 1 View  Result Date: 02/11/2023 CLINICAL DATA:  67 year old female with history of altered mental status. EXAM: PORTABLE CHEST 1 VIEW COMPARISON:  Chest x-ray 09/09/2022. FINDINGS: Right  internal jugular single-lumen porta cath with tip terminating in the distal superior vena cava. Lung volumes are normal. No consolidative airspace disease. No pleural effusions. No pneumothorax. Calcified granuloma in the right mid lung. No pulmonary nodule or mass noted. Pulmonary vasculature and the cardiomediastinal silhouette are within normal limits. Atherosclerosis in the thoracic aorta. IMPRESSION: 1.  No radiographic evidence of acute cardiopulmonary disease. 2. Aortic atherosclerosis. Electronically Signed   By: Trudie Reed M.D.   On: 02/11/2023 10:52     Discharge Exam: Vitals:   02/14/23 2336 02/15/23 0430  BP: 106/68 116/82  Pulse: 88 93  Resp: 15 16  Temp: 99.1 F (37.3 C) 98.3 F (36.8 C)  SpO2: 96% (!) 87%   Vitals:   02/14/23 2239 02/14/23 2336 02/15/23 0430 02/15/23 0500  BP: 113/70 106/68 116/82   Pulse: 84 88 93  Resp: 18 15 16    Temp: 98.7 F (37.1 C) 99.1 F (37.3 C) 98.3 F (36.8 C)   TempSrc: Oral     SpO2: 99% 96% (!) 87%   Weight:    55.9 kg  Height:        General: Pt is alert, awake, not in acute distress Cardiovascular: RRR, S1/S2 +, no rubs, no gallops Respiratory: CTA bilaterally, no wheezing, no rhonchi Abdominal: Soft, NT, ND, bowel sounds + Extremities: no edema, no cyanosis    The results of significant diagnostics from this hospitalization (including imaging, microbiology, ancillary and laboratory) are listed below for reference.     Microbiology: No results found for this or any previous visit (from the past 240 hour(s)).   Labs: BNP (last 3 results) No results for input(s): "BNP" in the last 8760 hours. Basic Metabolic Panel: Recent Labs  Lab 02/09/23 0919 02/11/23 1003 02/12/23 0538 02/13/23 0524 02/14/23 0221  NA 133* 131* 133* 137 136  K 3.5 3.1* 3.6 3.4* 3.6  CL 94* 95* 99 102 101  CO2 26 23 25 25 25   GLUCOSE 109* 134* 92 73 94  BUN 18 10 11 9 10   CREATININE 0.62 0.50 0.53 0.54 0.70  CALCIUM 8.4* 8.2* 8.0*  7.9* 8.0*  MG  --   --   --  1.9 1.9  PHOS  --   --   --   --  3.2   Liver Function Tests: Recent Labs  Lab 02/09/23 0919 02/11/23 1003 02/12/23 0538 02/13/23 0524  AST 10* 16 11* 9*  ALT 5 9 9 9   ALKPHOS 82 87 67 59  BILITOT 1.0 1.6* 1.1 0.8  PROT 6.3* 6.5 5.3* 4.8*  ALBUMIN 3.5 2.9* 2.4* 2.3*   No results for input(s): "LIPASE", "AMYLASE" in the last 168 hours. Recent Labs  Lab 02/12/23 1201  AMMONIA 17   CBC: Recent Labs  Lab 02/09/23 0919 02/11/23 1003 02/12/23 0538 02/13/23 0524 02/13/23 1215 02/13/23 1608  WBC 11.8* 10.6* 8.3 5.4  --   --   NEUTROABS 10.4*  --   --  4.4  --   --   HGB 8.8* 9.4* 7.4* 7.1* 7.8* 7.0*  HCT 27.9* 31.1* 24.2* 23.5* 25.3* 23.1*  MCV 96.9 100.6* 100.4* 103.1*  --   --   PLT 367 324 265 248  --   --    Cardiac Enzymes: No results for input(s): "CKTOTAL", "CKMB", "CKMBINDEX", "TROPONINI" in the last 168 hours. BNP: Invalid input(s): "POCBNP" CBG: Recent Labs  Lab 02/11/23 1045 02/12/23 0740 02/13/23 0742 02/15/23 0757  GLUCAP 115* 85 82 72   D-Dimer No results for input(s): "DDIMER" in the last 72 hours. Hgb A1c No results for input(s): "HGBA1C" in the last 72 hours. Lipid Profile No results for input(s): "CHOL", "HDL", "LDLCALC", "TRIG", "CHOLHDL", "LDLDIRECT" in the last 72 hours. Thyroid function studies No results for input(s): "TSH", "T4TOTAL", "T3FREE", "THYROIDAB" in the last 72 hours.  Invalid input(s): "FREET3" Anemia work up No results for input(s): "VITAMINB12", "FOLATE", "FERRITIN", "TIBC", "IRON", "RETICCTPCT" in the last 72 hours. Urinalysis    Component Value Date/Time   COLORURINE AMBER (A) 02/11/2023 1341   APPEARANCEUR CLEAR 02/11/2023 1341   LABSPEC 1.019 02/11/2023 1341   PHURINE 6.0 02/11/2023 1341   GLUCOSEU NEGATIVE 02/11/2023 1341   HGBUR NEGATIVE 02/11/2023 1341   BILIRUBINUR SMALL (A) 02/11/2023 1341   BILIRUBINUR positive 11/15/2022 1337   KETONESUR 80 (A) 02/11/2023 1341   PROTEINUR  100 (A) 02/11/2023 1341  UROBILINOGEN 1.0 (A) 11/15/2022 1337   NITRITE NEGATIVE 02/11/2023 1341   LEUKOCYTESUR NEGATIVE 02/11/2023 1341   Sepsis Labs Recent Labs  Lab 02/09/23 0919 02/11/23 1003 02/12/23 0538 02/13/23 0524  WBC 11.8* 10.6* 8.3 5.4   Microbiology No results found for this or any previous visit (from the past 240 hour(s)).  FURTHER DISCHARGE INSTRUCTIONS:   Get Medicines reviewed and adjusted: Please take all your medications with you for your next visit with your Primary MD   Laboratory/radiological data: Please request your Primary MD to go over all hospital tests and procedure/radiological results at the follow up, please ask your Primary MD to get all Hospital records sent to his/her office.   In some cases, they will be blood work, cultures and biopsy results pending at the time of your discharge. Please request that your primary care M.D. goes through all the records of your hospital data and follows up on these results.   Also Note the following: If you experience worsening of your admission symptoms, develop shortness of breath, life threatening emergency, suicidal or homicidal thoughts you must seek medical attention immediately by calling 911 or calling your MD immediately  if symptoms less severe.   You must read complete instructions/literature along with all the possible adverse reactions/side effects for all the Medicines you take and that have been prescribed to you. Take any new Medicines after you have completely understood and accpet all the possible adverse reactions/side effects.    Do not drive when taking Pain medications or sleeping medications (Benzodaizepines)   Do not take more than prescribed Pain, Sleep and Anxiety Medications. It is not advisable to combine anxiety,sleep and pain medications without talking with your primary care practitioner   Special Instructions: If you have smoked or chewed Tobacco  in the last 2 yrs please stop  smoking, stop any regular Alcohol  and or any Recreational drug use.   Wear Seat belts while driving.   Please note: You were cared for by a hospitalist during your hospital stay. Once you are discharged, your primary care physician will handle any further medical issues. Please note that NO REFILLS for any discharge medications will be authorized once you are discharged, as it is imperative that you return to your primary care physician (or establish a relationship with a primary care physician if you do not have one) for your post hospital discharge needs so that they can reassess your need for medications and monitor your lab values  Time coordinating discharge: Over 30 minutes  SIGNED:   Hughie Closs, MD  [facility] 02/15/2023, 1:16 PM *Please note that this is a verbal dictation therefore any spelling or grammatical errors are due to the "[employer] One" system interpretation. If 7PM-7AM, please contact night-coverage [email or web address]

## 2023-02-16 ENCOUNTER — Telehealth: Payer: Self-pay

## 2023-02-16 ENCOUNTER — Other Ambulatory Visit: Payer: Self-pay

## 2023-02-16 NOTE — Transitions of Care (Post Inpatient/ED Visit) (Signed)
02/16/2023  Name: Alyssa Deleon MRN: [id number] DOB: 1955-08-03  Today's TOC FU Call Status: Today's TOC FU Call Status:: Successful TOC FU Call Completed TOC FU Call Complete Date: 02/16/23 Patient's Name and Date of Birth confirmed.  Transition Care Management Follow-up Telephone Call Date of Discharge: 02/15/23 Discharge Facility: [facility]) Type of Discharge: Inpatient Admission Primary Inpatient Discharge Diagnosis:: Dehydration with confusion, low Na. 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 How have you been since you were released from the hospital?: Better Any questions or concerns?: No  Items Reviewed: Did you receive and understand the discharge instructions provided?: Yes Medications obtained,verified, and reconciled?: Yes (Medications Reviewed) Any new allergies since your discharge?: No Dietary orders reviewed?: No Do you have support at home?: Yes People in Home: alone (Daughter and Aunts live close by and assist with care/transportation) Name of Support/Comfort Primary Source: [place] (Daughter)  [phone] (Mobile)  Medications Reviewed Today: Medications Reviewed Today     Reviewed by Bing Quarry, RN (Case Manager) on 02/16/23 at 1411  Med List Status: <None>   Medication Order Taking? Sig Documenting Provider Last Dose Status Informant  acetaminophen (TYLENOL) 500 MG tablet [id number] Yes Take 1,000 mg by mouth every 6 (six) hours as needed for mild pain (pain score 1-3) or headache. [provider] Taking Active Multiple Informants  ALPRAZolam (XANAX) 1 MG tablet [id number] Yes TAKE 2 TABLETS BY MOUTH EVERY  NIGHT AT BEDTIME AS NEEDED FOR  ANXIETY  Patient taking differently: Take 1-1.5 mg by mouth See admin instructions. Take 1.5 mg by mouth at bedtime and an additional 1 mg one to two times a day as needed for panic/anxiety   Allwardt, Crist Infante, PA-C Taking Active Multiple Informants           Med Note Salvatore Marvel   Sat Feb 11, 2023  3:08 PM)    Budeson-Glycopyrrol-Formoterol (BREZTRI AEROSPHERE) 160-9-4.8 MCG/ACT Sandrea Matte [id number] Yes Inhale 2 puffs into the lungs in the morning and at bedtime. Rinse mouth after use.  DAILY USE FOR COPD MAINTENANCE TREATMENT. Allwardt, Crist Infante, PA-C Taking Active Multiple Informants           Med Note Salvatore Marvel   Sat Feb 11, 2023  2:40 PM) DOES NOT HELP  buPROPion (WELLBUTRIN XL) 300 MG 24 hr tablet [id number] Yes TAKE 1 TABLET BY MOUTH DAILY  Patient taking differently: Take 300 mg by mouth in the morning.   Allwardt, Crist Infante, PA-C Taking Active Multiple Informants  Calcium Carb-Cholecalciferol (CALCIUM + D3 PO) [id number] Yes Take 1 tablet by mouth daily with breakfast. [provider] Taking Active Multiple Informants  denosumab (PROLIA) 60 MG/ML SOSY injection [id number]  Inject 60 mg into the skin every 6 (six) months. [provider]  Active Multiple Informants  DULoxetine (CYMBALTA) 60 MG capsule [id number] Yes TAKE 1 CAPSULE BY MOUTH DAILY  Patient taking differently: Take 60 mg by mouth in the morning.   Allwardt, Crist Infante, PA-C Taking Active Multiple Informants  ezetimibe (ZETIA) 10 MG tablet [id number] Yes Take 10 mg by mouth in the morning. [provider] Taking Active Multiple Informants  folic acid (FOLVITE) 1 MG tablet [id number] Yes Take 1 tablet (1 mg total) by mouth daily. Hughie Closs, MD Taking Active   gabapentin (NEURONTIN) 300 MG capsule [id number] Yes TAKE 1 CAPSULE BY MOUTH TWICE  DAILY  Patient taking differently: Take 300 mg by mouth in the morning.   Allwardt, Crist Infante, PA-C Taking  Active Multiple Informants  HYDROmorphone (DILAUDID) 2 MG tablet [id number] Yes Take 1-2 tablets (2-4 mg total) by mouth every 4 (four) hours as needed for severe pain (pain score 7-10). Ladene Artist, MD Taking Active Multiple Informants  magic mouthwash (nystatin, diphenhydrAMINE, alum & mag hydroxide) suspension mixture [id number] Yes Swish and  spit 5 mL by mouth as needed for mouth pain.  Taking Active Multiple Informants  simvastatin (ZOCOR) 40 MG tablet [id number] Yes Take 40 mg by mouth at bedtime. [provider] Taking Active Multiple Informants  TYRVAYA 0.03 MG/ACT SOLN [id number] Yes Place 1 spray into both nostrils in the morning. [provider] Taking Active Multiple Informants           Med Note Salvatore Marvel   GEX Feb 11, 2023  2:41 PM) Nasal spray that treats the eyes            Home Care and Equipment/Supplies: Were Home Health Services Ordered?: Yes Name of Home Health Agency:: [facility] Has Agency set up a time to come to your home?: Yes First Home Health Visit Date: 02/16/23 Any new equipment or medical supplies ordered?: No (Has needed DME at home already per daughter.)  Functional Questionnaire: Do you need assistance with bathing/showering or dressing?: Yes (Daughter Boneta Lucks is seeking private care services for help with bathing.) Do you need assistance with meal preparation?: No Do you need assistance with eating?: No Do you have difficulty maintaining continence: Yes (Bowel incontinence occasionally due to cancer dx) Do you need assistance with getting out of bed/getting out of a chair/moving?: No Do you have difficulty managing or taking your medications?: No  Follow up appointments reviewed: PCP Follow-up appointment confirmed?: Yes Date of PCP follow-up appointment?: 02/21/23 Follow-up Provider: [facility] Follow-up appointment confirmed?: Yes Date of Specialist follow-up appointment?: 02/20/23 Follow-Up Specialty Provider:: Lonna Cobb Oncology w Dr. Truett Perna Do you need transportation to your follow-up appointment?: No Do you understand care options if your condition(s) worsen?: Yes-patient verbalized understanding  02/16/23 RN CM spoke with daughter and patient on the call. Daughter, Antony Contras, answering most questions with input from patient as well.   [facility] to come for first visit today. Has all  PCP and specialist follow up appointments noted. Medications and Allergy review completed.  Daughter seeking private care services for help with bathing patient. Patient undergoing current treatment for anal cancer and followed by Dr Sherrill/Oncology. Had port in place. Declined further follow up needs due to close follow up with oncology provider and office. Thanked TOC RN CM for checking on on patient post discharge.    Gabriel Cirri MSN, RN [place]  [facility] Management Coordinator [email or web address] Direct Dial: [phone] Fax: [phone]

## 2023-02-16 NOTE — Transitions of Care (Post Inpatient/ED Visit) (Signed)
   02/16/2023  Name: Alyssa Deleon MRN: [id number] DOB: 15-Feb-1956  Today's TOC FU Call Status: Today's TOC FU Call Status:: Unsuccessful Call (1st Attempt) Unsuccessful Call (1st Attempt) Date: 02/16/23  Attempted to reach the patient regarding the most recent Inpatient/ED visit.  Follow Up Plan: Additional outreach attempts will be made to reach the patient to complete the Transitions of Care (Post Inpatient/ED visit) call.   Signature Karena Addison, LPN [facility] Nurse Health Advisor Direct Dial [phone]

## 2023-02-17 ENCOUNTER — Ambulatory Visit (HOSPITAL_BASED_OUTPATIENT_CLINIC_OR_DEPARTMENT_OTHER): Payer: Medicare Other

## 2023-02-17 ENCOUNTER — Telehealth: Payer: Self-pay | Admitting: Physician Assistant

## 2023-02-17 ENCOUNTER — Inpatient Hospital Stay: Payer: Medicare Other

## 2023-02-17 NOTE — Telephone Encounter (Signed)
Home Health Verbal Orders  Agency:  [facility]  Caller: Liji  Contact and title  Ph# [phone] -PT  Requesting PT:    Reason for Request:  Functional decline, strengthen and improve balance, so that she can continue to stay at home  Frequency:  1 x per week for 1 week, 2 x per week for 3 weeks, then 1 x per week for 3 weeks  HH needs F2F w/in last 30 days

## 2023-02-18 ENCOUNTER — Other Ambulatory Visit: Payer: Self-pay | Admitting: Oncology

## 2023-02-20 ENCOUNTER — Inpatient Hospital Stay: Payer: Medicare Other

## 2023-02-20 ENCOUNTER — Inpatient Hospital Stay: Payer: Medicare Other | Admitting: Nurse Practitioner

## 2023-02-20 ENCOUNTER — Encounter: Payer: Self-pay | Admitting: Nurse Practitioner

## 2023-02-20 ENCOUNTER — Other Ambulatory Visit (HOSPITAL_BASED_OUTPATIENT_CLINIC_OR_DEPARTMENT_OTHER): Payer: Self-pay

## 2023-02-20 ENCOUNTER — Other Ambulatory Visit: Payer: Self-pay

## 2023-02-20 ENCOUNTER — Telehealth: Payer: Self-pay

## 2023-02-20 VITALS — BP 90/65 | HR 102 | Temp 98.2°F | Resp 18 | Ht 67.0 in | Wt 116.0 lb

## 2023-02-20 DIAGNOSIS — C210 Malignant neoplasm of anus, unspecified: Secondary | ICD-10-CM

## 2023-02-20 DIAGNOSIS — I959 Hypotension, unspecified: Secondary | ICD-10-CM | POA: Diagnosis not present

## 2023-02-20 DIAGNOSIS — Z79620 Long term (current) use of immunosuppressive biologic: Secondary | ICD-10-CM | POA: Diagnosis not present

## 2023-02-20 DIAGNOSIS — Z5112 Encounter for antineoplastic immunotherapy: Secondary | ICD-10-CM | POA: Diagnosis present

## 2023-02-20 DIAGNOSIS — C787 Secondary malignant neoplasm of liver and intrahepatic bile duct: Secondary | ICD-10-CM | POA: Diagnosis not present

## 2023-02-20 NOTE — Progress Notes (Signed)
[facility] OFFICE PROGRESS NOTE   Diagnosis: Anal cancer  INTERVAL HISTORY:   Alyssa Deleon returns as scheduled.  She was hospitalized 02/11/2023 through 02/15/2023 with altered mental status, orthostasis.  Her mental status improved during the hospitalization.  No source for infection was identified.  Altered mental status felt to possibly be related to polypharmacy.  She continues to have rectal pain.  She has recently noted pain and swelling at the left hip.  Last night and this morning she noted pain at the right abdomen.  She takes Dilaudid at bedtime.  Bowels have been moving.  She periodically sees blood/mucus.  She reports good fluid intake and improvement in appetite in general. She has persistent numbness in the hands and feet.  Objective:  Vital signs in last 24 hours:  Blood pressure 90/65, pulse (!) 102, temperature 98.2 F (36.8 C), temperature source Temporal, resp. rate 18, height 5\' 7"  (1.702 m), weight 116 lb (52.6 kg), SpO2 98%.    HEENT: No thrush. Resp: Lungs clear bilaterally. Cardio: Regular rate and rhythm. GI: No hepatosplenomegaly. Vascular: No leg edema. Musculoskeletal: Firm masslike fullness left greater trochanter. Skin: Approximate 1 cm firm raised pink lesion at the parieto-occipital scalp. Port-A-Cath without erythema.  Lab Results:  Lab Results  Component Value Date   WBC 7.5 02/15/2023   HGB 8.7 (L) 02/15/2023   HCT 27.2 (L) 02/15/2023   MCV 98.2 02/15/2023   PLT 252 02/15/2023   NEUTROABS 6.1 02/15/2023    Imaging:  No results found.  Medications: I have reviewed the patient's current medications.  Assessment/Plan: Anal cancer Anal mass noted on digital exam and colonoscopy 05/09/2022-biopsy consistent with squamous cell carcinoma CTs 05/12/2022-no evidence of thoracic metastatic disease, calcified right pleural plaques, asymmetric thickening of the right posterior wall of the distal rectum/anal canal, no evidence of  metastatic adenopathy in the abdomen or pelvis, 2 small hypodense left liver lesions favored to represent benign cysts PET scan 05/30/2022-intense hypermetabolic activity at the anus.  No evidence of metastatic adenopathy.  Indeterminate small, mildly hypermetabolic lesion in the lateral segment of the left hepatic lobe. Radiation 06/06/2022-07/19/2022 Cycle one 5-FU/Mitomycin-C 06/06/2022 cycle two 5-FU/Mitomycin-C 07/04/2022 (5-FU dose reduced secondary to diarrhea) MRI liver 06/15/2022-1 cm hypervascular mass in segment 2 of the left hepatic lobe, corresponds to the focus of hypermetabolism on recent PET-CT.  Imaging characteristics atypical for metastatic squamous cell carcinoma.  Follow-up MRI in 3 months Cycle two 5-FU/Mitomycin-C 07/04/2022 (5-FU dose reduced secondary to diarrhea) 08/29/2022 anoscopy, Dr. Maisie Fus, smaller posterior midline rectal mass approximately in the dentate line MRI liver 09/12/2022-enlargement of a complex cystic and solid-appearing enhancing lesion in segment 2 of the liver, 4 x 2.2 cm compared to 1.5 cm on previous study PET scan 09/23/2022-interval increase in size and tracer avidity of a solitary metastasis within the lateral segment of left hepatic lobe.  Tracer avid lesion involving the anus again noted, slightly decreased in size and tracer avidity. 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 Modesta Messing 10/10/2022, presented at multidisciplinary clinic-recommendation for 3 months of treatment with systemic therapy to address the liver followed by restaging scan and also MRI of the pelvis to assess the primary site  Cycle 1 Taxol/carboplatin 11/03/2022 12/07/2022 chemotherapy discontinued due to poor performance status Cycle 1 nivolumab 12/12/2022 Cycle 2 nivolumab 12/26/2022 Cycle 3 nivolumab 01/09/2023 Cycle 4 nivolumab 01/23/2023 Cycle 5 nivolumab 02/09/2023 CT abdomen/pelvis 02/15/2023-interval development of multiple ill-defined liver lesions in  both lobes, most appear new since chest  CT 11/22/2022.  Wall thickening in the rectum with 2.4 x 2.2 x 4.8 cm irregular hyper enhancing lesion in the lower right rectum extending toward the anus.  Small volume free fluid in the abdomen and pelvis.  Ill-defined nodular tissue density right omentum. 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  She completed 5 cycles of nivolumab with evidence of disease progression involving the liver on CT 02/15/2023.  Results/images reviewed with her and her family at today's visit.  They understand the scalp lesion and the masslike area at the left hip may also be progressive cancer.  She does not appear to be a candidate for additional systemic therapy based on her performance status and comorbid conditions.  They understand the recommendation for supportive care with a hospice referral and are in agreement.  We reviewed CODE STATUS/end-of-life issues.  She will be placed on No Code Blue status.  She will return for port flush and follow-up in approximately 4 weeks.  We are available to see her sooner if needed.  Patient seen with Dr. Truett Perna.    Alyssa Deleon ANP/GNP-BC   02/20/2023  12:04 PM This was a shared visit with Alyssa Deleon.  Alyssa Deleon was interviewed and examined.  She has metastatic anal cancer.  There is clinical and radiologic evidence of disease  progression.  She appears to have a metastasis at the left trochanter.  We discussed the prognosis and treatment options of Alyssa Deleon and her family.  She has a poor performance status.  She does not appear to be a candidate for further systemic therapy.  I recommend hospice care.  She is in agreement.  We discussed CODE STATUS.  She agrees to a no CODE BLUE status.  Will make referral to [facility] for home hospice care.  We reviewed the restaging CT findings and images with Alyssa Deleon and her family.  I was present for greater than 50% of today's visit.  I performed medical decision making.  Mancel Bale, MD

## 2023-02-20 NOTE — Telephone Encounter (Signed)
I spoke with the intake representative at [facility], who confirmed that they have received the hospice referral. Additionally, I noted that Dr. Truett Perna is the attending physician and that her daughter is the primary contact.

## 2023-02-20 NOTE — Telephone Encounter (Signed)
Contacted Liji with [facility] and advised ok for verbal orders, pt has an appointment this week to complete F2F form and got fax number to send once completed [phone]. Pt was seen once last week for evaluation and states since holiday will only be seeing once this week. After will continue the 2x per wk for 3 wks.

## 2023-02-21 ENCOUNTER — Ambulatory Visit: Payer: Medicare Other | Admitting: Physician Assistant

## 2023-02-21 ENCOUNTER — Telehealth: Payer: Self-pay

## 2023-02-21 ENCOUNTER — Inpatient Hospital Stay: Admit: 2023-02-21 | Discharge: 2023-02-21 | Payer: BLUE CROSS/BLUE SHIELD | Primary: Internal Medicine

## 2023-02-21 DIAGNOSIS — C3490 Malignant neoplasm of unspecified part of unspecified bronchus or lung: Secondary | ICD-10-CM

## 2023-02-21 MED ORDER — SODIUM CHLORIDE 0.9 % LARGE VOLUME SYRINGE FOR AUTOINJECTOR
Freq: Once | INTRAVENOUS | Status: CP | PRN
Start: 2023-02-21 — End: ?
  Administered 2023-02-21: 21:00:00 via INTRAVENOUS

## 2023-02-21 MED ORDER — IOHEXOL 350 MG IODINE/ML INTRAVENOUS SOLUTION
350 | Freq: Once | INTRAVENOUS | Status: CP | PRN
Start: 2023-02-21 — End: ?
  Administered 2023-02-21: 21:00:00 350 mL via INTRAVENOUS

## 2023-02-21 NOTE — Telephone Encounter (Signed)
Noted and agreed, thank you. We'll see what hospice says.

## 2023-02-21 NOTE — Telephone Encounter (Signed)
Pt daughter Boneta Lucks called to advise PCP that they have stopped treatments with patient and Hospice has been called in for patient. Wanting to know next steps, if needing to keep appointments with PCP. Today will need to rescheduled, unable to keep and wants to talk to Hospice first. Please advise

## 2023-02-21 NOTE — Transitions of Care (Post Inpatient/ED Visit) (Signed)
02/21/2023  Name: Alyssa Deleon MRN: [id number] DOB: 07-20-1955  Today's TOC FU Call Status: Today's TOC FU Call Status:: Successful TOC FU Call Completed Unsuccessful Call (1st Attempt) Date: 02/16/23 [facility] FU Call Complete Date: 02/21/23 Patient's Name and Date of Birth confirmed.  Transition Care Management Follow-up Telephone Call Date of Discharge: 02/15/23 Discharge Facility: [facility]) Type of Discharge: Inpatient Admission Primary Inpatient Discharge Diagnosis:: dehydration How have you been since you were released from the hospital?: Same Any questions or concerns?: No  Items Reviewed: Did you receive and understand the discharge instructions provided?: Yes Medications obtained,verified, and reconciled?: Yes (Medications Reviewed) Any new allergies since your discharge?: No Dietary orders reviewed?: Yes Do you have support at home?: Yes People in Home: child(ren), adult  Medications Reviewed Today: Medications Reviewed Today     Reviewed by Karena Addison, LPN (Licensed Practical Nurse) on 02/21/23 at 1554  Med List Status: <None>   Medication Order Taking? Sig Documenting Provider Last Dose Status Informant  acetaminophen (TYLENOL) 500 MG tablet [id number] No Take 1,000 mg by mouth every 6 (six) hours as needed for mild pain (pain score 1-3) or headache. [provider] Taking Active Multiple Informants  ALPRAZolam (XANAX) 1 MG tablet [id number] No TAKE 2 TABLETS BY MOUTH EVERY  NIGHT AT BEDTIME AS NEEDED FOR  ANXIETY  Patient taking differently: Take 1-1.5 mg by mouth See admin instructions. Take 1.5 mg by mouth at bedtime and an additional 1 mg one to two times a day as needed for panic/anxiety   Allwardt, Crist Infante, PA-C Taking Active Multiple Informants           Med Note Salvatore Marvel   Sat Feb 11, 2023  3:08 PM)    Budeson-Glycopyrrol-Formoterol (BREZTRI AEROSPHERE) 160-9-4.8 MCG/ACT AERO [id number] No Inhale 2 puffs into the lungs in the  morning and at bedtime. Rinse mouth after use.  DAILY USE FOR COPD MAINTENANCE TREATMENT. Allwardt, Crist Infante, PA-C Taking Active Multiple Informants           Med Note Salvatore Marvel   Sat Feb 11, 2023  2:40 PM) DOES NOT HELP  buPROPion (WELLBUTRIN XL) 300 MG 24 hr tablet [id number] No TAKE 1 TABLET BY MOUTH DAILY  Patient taking differently: Take 300 mg by mouth in the morning.   Allwardt, Crist Infante, PA-C Taking Active Multiple Informants  Calcium Carb-Cholecalciferol (CALCIUM + D3 PO) [id number] No Take 1 tablet by mouth daily with breakfast. [provider] Taking Active Multiple Informants  denosumab (PROLIA) 60 MG/ML SOSY injection [id number] No Inject 60 mg into the skin every 6 (six) months. [provider] Taking Active Multiple Informants  DULoxetine (CYMBALTA) 60 MG capsule [id number] No TAKE 1 CAPSULE BY MOUTH DAILY  Patient taking differently: Take 60 mg by mouth in the morning.   Allwardt, Crist Infante, PA-C Taking Active Multiple Informants  ezetimibe (ZETIA) 10 MG tablet [id number] No Take 10 mg by mouth in the morning. [provider] Taking Active Multiple Informants  folic acid (FOLVITE) 1 MG tablet [id number] No Take 1 tablet (1 mg total) by mouth daily. Hughie Closs, MD Taking Active   gabapentin (NEURONTIN) 300 MG capsule [id number] No TAKE 1 CAPSULE BY MOUTH TWICE  DAILY  Patient taking differently: Take 300 mg by mouth in the morning.   Allwardt, Crist Infante, PA-C Taking Active Multiple Informants  HYDROmorphone (DILAUDID) 2 MG tablet [id number] No Take 1-2 tablets (2-4 mg total) by mouth every 4 (four) hours as needed for  severe pain (pain score 7-10). Ladene Artist, MD Taking Active Multiple Informants  magic mouthwash (nystatin, diphenhydrAMINE, alum & mag hydroxide) suspension mixture [id number] No Swish and spit 5 mL by mouth as needed for mouth pain.  Taking Active Multiple Informants  simvastatin (ZOCOR) 40 MG tablet [id number] No Take 40 mg by mouth at  bedtime. [provider] Taking Active Multiple Informants  TYRVAYA 0.03 MG/ACT SOLN [id number] No Place 1 spray into both nostrils in the morning. [provider] Taking Active Multiple Informants           Med Note Salvatore Marvel   KGM Feb 11, 2023  2:41 PM) Nasal spray that treats the eyes            Home Care and Equipment/Supplies: Were Home Health Services Ordered?: Yes [facility] of Home Health Agency[facility] Has Agency set up a time to come to your home?: Yes First Home Health Visit Date: 02/20/23 Any new equipment or medical supplies ordered?: NA  Functional Questionnaire: Do you need assistance with bathing/showering or dressing?: Yes Do you need assistance with meal preparation?: Yes Do you need assistance with eating?: No Do you have difficulty maintaining continence: No Do you need assistance with getting out of bed/getting out of a chair/moving?: Yes Do you have difficulty managing or taking your medications?: Yes  Follow up appointments reviewed: PCP Follow-up appointment confirmed?: NA (in Hospice care) Specialist Hospital Follow-up appointment confirmed?: NA Do you need transportation to your follow-up appointment?: No Do you understand care options if your condition(s) worsen?: Yes-patient verbalized understanding In hospice care   SIGNATURE Karena Addison, LPN [facility] Nurse Health Advisor Direct Dial [phone]

## 2023-02-22 ENCOUNTER — Telehealth: Payer: Self-pay | Admitting: *Deleted

## 2023-02-22 NOTE — Telephone Encounter (Signed)
Was seen by Hospice today. 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 Patient asking if dose could be increased? Per Lonna Cobb, NP: OK to increase to 300 mg tid.

## 2023-02-24 ENCOUNTER — Ambulatory Visit: Admit: 2023-02-24 | Payer: BLUE CROSS/BLUE SHIELD | Attending: Adult Health | Primary: Internal Medicine

## 2023-02-24 ENCOUNTER — Ambulatory Visit: Admit: 2023-02-24 | Payer: BLUE CROSS/BLUE SHIELD | Primary: Internal Medicine

## 2023-02-28 ENCOUNTER — Other Ambulatory Visit (HOSPITAL_COMMUNITY): Payer: Self-pay

## 2023-02-28 ENCOUNTER — Telehealth: Payer: Medicare Other | Admitting: Physician Assistant

## 2023-03-02 ENCOUNTER — Telehealth: Payer: Self-pay | Admitting: Physician Assistant

## 2023-03-02 NOTE — Telephone Encounter (Signed)
Received faxed document Home Health Certificate (Order ID [id number]), to be filled out by provider. Patient requested to send it back via Fax within 5-days. Document is located in providers tray at front office.Please advise

## 2023-03-03 ENCOUNTER — Encounter (HOSPITAL_COMMUNITY): Payer: Self-pay

## 2023-03-03 ENCOUNTER — Other Ambulatory Visit (HOSPITAL_COMMUNITY): Payer: Self-pay

## 2023-03-03 ENCOUNTER — Inpatient Hospital Stay: Admit: 2023-03-03 | Discharge: 2023-03-03 | Payer: BLUE CROSS/BLUE SHIELD | Primary: Internal Medicine

## 2023-03-03 ENCOUNTER — Ambulatory Visit: Admit: 2023-03-03 | Payer: BLUE CROSS/BLUE SHIELD | Attending: Hematology | Primary: Internal Medicine

## 2023-03-03 ENCOUNTER — Encounter: Admit: 2023-03-03 | Payer: PRIVATE HEALTH INSURANCE | Attending: Hematology | Primary: Internal Medicine

## 2023-03-03 ENCOUNTER — Encounter: Admit: 2023-03-03 | Payer: PRIVATE HEALTH INSURANCE | Primary: Internal Medicine

## 2023-03-03 ENCOUNTER — Ambulatory Visit: Admit: 2023-03-03 | Payer: BLUE CROSS/BLUE SHIELD | Primary: Internal Medicine

## 2023-03-03 VITALS — BP 117/80 | HR 87 | Temp 97.00000°F | Resp 18 | Wt 72.0 lb

## 2023-03-03 DIAGNOSIS — C3490 Malignant neoplasm of unspecified part of unspecified bronchus or lung: Secondary | ICD-10-CM

## 2023-03-03 DIAGNOSIS — J449 Chronic obstructive pulmonary disease, unspecified: Secondary | ICD-10-CM

## 2023-03-03 DIAGNOSIS — R413 Other amnesia: Secondary | ICD-10-CM

## 2023-03-03 DIAGNOSIS — F419 Anxiety disorder, unspecified: Secondary | ICD-10-CM

## 2023-03-03 DIAGNOSIS — Z720 Tobacco use: Secondary | ICD-10-CM

## 2023-03-03 DIAGNOSIS — B1920 Unspecified viral hepatitis C without hepatic coma: Secondary | ICD-10-CM

## 2023-03-03 DIAGNOSIS — F32A Depression: Secondary | ICD-10-CM

## 2023-03-03 LAB — COMPREHENSIVE METABOLIC PANEL
BKR A/G RATIO: 0.9 — ABNORMAL LOW (ref 1.0–2.2)
BKR ALANINE AMINOTRANSFERASE (ALT): 18 U/L (ref 10–35)
BKR ALBUMIN: 4.1 g/dL (ref 3.6–5.1)
BKR ALKALINE PHOSPHATASE: 90 U/L (ref 9–122)
BKR ANION GAP: 14 (ref 7–17)
BKR ASPARTATE AMINOTRANSFERASE (AST): 37 U/L — ABNORMAL HIGH (ref 10–35)
BKR AST/ALT RATIO: 2.1
BKR BILIRUBIN TOTAL: 0.7 mg/dL (ref <=1.2)
BKR BLOOD UREA NITROGEN: 36 mg/dL — ABNORMAL HIGH (ref 8–23)
BKR BUN / CREAT RATIO: 38.7 — ABNORMAL HIGH (ref 8.0–23.0)
BKR CALCIUM: 9.8 mg/dL (ref 8.8–10.2)
BKR CHLORIDE: 100 mmol/L (ref 98–107)
BKR CO2: 25 mmol/L (ref 20–30)
BKR CREATININE DELTA: 0.48 — ABNORMAL HIGH
BKR CREATININE: 0.93 mg/dL (ref 0.40–1.30)
BKR EGFR, CREATININE (CKD-EPI 2021): >60 mL/min/{1.73_m2} (ref >=60)
BKR GLOBULIN: 4.6 g/dL — ABNORMAL HIGH (ref 2.0–3.9)
BKR GLUCOSE: 165 mg/dL — ABNORMAL HIGH (ref 70–100)
BKR POTASSIUM: 4.9 mmol/L (ref 3.3–5.3)
BKR PROTEIN TOTAL: 8.7 g/dL — ABNORMAL HIGH (ref 5.9–8.3)
BKR SODIUM: 139 mmol/L (ref 136–144)

## 2023-03-03 LAB — CBC WITH AUTO DIFFERENTIAL
BKR WAM ABSOLUTE IMMATURE GRANULOCYTES.: 0.09 x 1000/ÂµL (ref 0.00–0.30)
BKR WAM ABSOLUTE LYMPHOCYTE COUNT.: 0.37 x 1000/ÂµL — ABNORMAL LOW (ref 0.60–3.70)
BKR WAM ABSOLUTE NRBC (2 DEC): 0 x 1000/ÂµL (ref 0.00–1.00)
BKR WAM ANC (ABSOLUTE NEUTROPHIL COUNT): 12.1 x 1000/ÂµL — ABNORMAL HIGH (ref 2.00–7.60)
BKR WAM BASOPHIL ABSOLUTE COUNT.: 0.02 x 1000/ÂµL (ref 0.00–1.00)
BKR WAM BASOPHILS: 0.2 % (ref 0.0–1.4)
BKR WAM EOSINOPHIL ABSOLUTE COUNT.: 0.02 x 1000/ÂµL (ref 0.00–1.00)
BKR WAM EOSINOPHILS: 0.2 % (ref 0.0–5.0)
BKR WAM HEMATOCRIT (2 DEC): 49.9 % — ABNORMAL HIGH (ref 35.00–45.00)
BKR WAM HEMOGLOBIN: 16.2 g/dL — ABNORMAL HIGH (ref 11.7–15.5)
BKR WAM IMMATURE GRANULOCYTES: 0.7 % (ref 0.0–1.0)
BKR WAM LYMPHOCYTES: 2.8 % — ABNORMAL LOW (ref 17.0–50.0)
BKR WAM MCH (PG): 30.7 pg (ref 27.0–33.0)
BKR WAM MCHC: 32.5 g/dL (ref 31.0–36.0)
BKR WAM MCV: 94.5 fL (ref 80.0–100.0)
BKR WAM MONOCYTE ABSOLUTE COUNT.: 0.72 x 1000/ÂµL (ref 0.00–1.00)
BKR WAM MONOCYTES: 5.4 % (ref 4.0–12.0)
BKR WAM MPV: 8.7 fL (ref 8.0–12.0)
BKR WAM NEUTROPHILS: 90.7 % — ABNORMAL HIGH (ref 39.0–72.0)
BKR WAM NUCLEATED RED BLOOD CELLS: 0 % (ref 0.0–1.0)
BKR WAM PLATELETS: 422 x1000/ÂµL — ABNORMAL HIGH (ref 150–420)
BKR WAM RDW-CV: 13 % (ref 11.0–15.0)
BKR WAM RED BLOOD CELL COUNT.: 5.28 M/ÂµL (ref 4.00–6.00)
BKR WAM WHITE BLOOD CELL COUNT: 13.3 x1000/ÂµL — ABNORMAL HIGH (ref 4.0–11.0)

## 2023-03-03 MED ORDER — TRAMADOL 50 MG TABLET
50 | ORAL_TABLET | Freq: Three times a day (TID) | ORAL | 1 refills | Status: AC | PRN
Start: 2023-03-03 — End: ?

## 2023-03-03 MED ORDER — MIRTAZAPINE 15 MG TABLET
15 | ORAL_TABLET | Freq: Every evening | ORAL | 2 refills | Status: AC
Start: 2023-03-03 — End: ?

## 2023-03-03 NOTE — Telephone Encounter (Signed)
Forms are in provider box for review

## 2023-03-06 ENCOUNTER — Other Ambulatory Visit: Payer: Self-pay

## 2023-03-06 ENCOUNTER — Inpatient Hospital Stay: Payer: Medicare Other | Admitting: Nurse Practitioner

## 2023-03-06 ENCOUNTER — Inpatient Hospital Stay: Payer: Medicare Other

## 2023-03-06 ENCOUNTER — Inpatient Hospital Stay: Payer: Medicare Other | Attending: Oncology

## 2023-03-06 ENCOUNTER — Encounter: Admit: 2023-03-06 | Payer: PRIVATE HEALTH INSURANCE | Attending: Adult Health | Primary: Internal Medicine

## 2023-03-06 ENCOUNTER — Encounter: Admit: 2023-03-06 | Payer: PRIVATE HEALTH INSURANCE | Primary: Internal Medicine

## 2023-03-06 DIAGNOSIS — C3490 Malignant neoplasm of unspecified part of unspecified bronchus or lung: Secondary | ICD-10-CM

## 2023-03-06 NOTE — Progress Notes (Signed)
Patient in hospice care. Dis-enrolling

## 2023-03-06 NOTE — Progress Notes
 SOCIAL WORK ASSESSMENTPatient Name: Courtney Knox Record Number: [id number] Date of Birth: [birth year]Medical Social Work Assessment Adult  Flowsheet Row Most Recent Value Admission Information  Document Type Clinical Assessment - Unable to Assess Reason for Inability to Assess Patient is not present Reason for Inability to Assess Comment Collateral information obtained from Courtney Knox's husband, Courtney Knox, and adult daughter, Hospital doctor. [name] was unwilling to participate by phone at the time of my outreach. Level of Care Ambulatory What medium(s) of communication were used with patient/family/caregiver? Telephone Relationships  Marital Status Married Lives With Spouse Family circumstances Courtney Knox is married and lives with her husband, Courtney Knox. They have one adult daughter, Courtney Knox, who lives nearby and who has two children of her own (son and daughter, ages 71 and 26 y.o.). Courtney Knox has a younger brother and sister in [place]. Her older brother died of lymphoma and her father died of lung cancer. Her daughter died from Alzheimer's Dementia in February of this year. Informal Supports (family, friends, church, Catering manager) husband, daughter, grandchildren, siblings Formal Supports (current community resources and providers) none reported Pertinent spiritual or cultural factors none reported Relationship Comments Courtney Knox is closely supported by her husband and daughter, who are her primary caregivers and advocate for her medical needs at appointments. Abuse Screen (yes response referral indicated)  Able to respond to abuse questions No  [Courtney Knox was not present at the time of my assessment.] Language needed None, Patient Speaks English Literacy Read/write independently Special Needs none reported Social Determinants of Health  Financial Concerns None Vocational and employment status and history Courtney Knox has been unable to work since her lung cancer diagnosis. 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 What is your living situation today? I have a steady place to live Where have you been sleeping? --  [not applicable,  resides at private home] Think about the place you live. Do you have problems with any of the following? None In the past 12 months has the electric, gas, oil, or water company threatened to shut off services in your home? No Within the past 12 months, you worried that your food would run out before you got the money to buy more. Never true Within the past 12 months, the food you bought just didn't last and you didn't have money to get more. Never true In the past 12 months, has lack of transportation kept you from medical appointments or from getting medications? No In the past 12 months, has lack of transportation kept you from meetings, work, or from getting things needed for daily living? No Housing/Transportation/Environmental Comment (use soup kitchens, food pantries, eviction pending, unable to afford) No issues reported. Mental Status  Mental Status Unable to Assess  [Courtney Knox was not present at the time of my outreach.] Injured Trauma Survivor Screen  Patient is able to answer all nine questions. No Suicide Risk Assessment  Reason for Assessment Utilizing SAFE-T and C-SSRS (Check all that apply) Social Work Consult/Assessment C-SSRS Unable to Assess Specific Questions about Thoughts, Plans, Suicidal Intent (SAFE-T) Unable to Assess Risk Assessment  Access to Firearms? No Risk Assessment Unable to Assess This patient was screened using the [place] Suicide Severity Rating Scale (CSSRS)  No This patient was not screened using the [place] Suicide Severity Rating Scale (CSSRS)  Courtney Knox did not participate in today's assessment. Cause for concern Unable to Assess Based on my assessment, the level of risk for this patient to suicide in an  inpatient or emergency setting is:  LOW and will be safe on standard observations Based on my assessment, the level of risk for this patient to suicide in the community is:  LOW Recommended Next Steps Remain in/Return to Community Remain in/Return to [employer] factors (see above) Substance Use  Active substance use Unable to assess Formulation: Recommendation(s) and Intervention(s) (including for discharge to occur)  Psychosocial issues requiring intervention caregiver support,  assessment of needs Psychosocial interventions 75 minutes were spent in direct contact by phone with Courtney Knox's husband, Courtney Knox, and daughter, Courtney Knox, in separate conversations. I had interacted briefly with them by phone last week at Courtney Knox's appointment with [name], MD, who contacted me during the appointment, but I reintroduced myself and the role and scope of social work services in the ambulatory setting at [facility] during my encounters today. Courtney Knox had declined to speak with me at her appointment last week, instead deferring to her family, who were able to provide collateral information. Both Courtney Knox and Courtney Knox spoke openly about concerns they have about Courtney Knox's mental status following her lung cancer diagnosis and throughout her treatment. Courtney Knox shared that changes in Courtney Knox's behaviors and cognition led them to seek evaluation at the [facility], but he did not find their feedback particularly helpful. During one presentation to the ED in September due to suicidal ideation, Courtney Knox was evaluated by psychiatry who ultimately felt she was safe to return home and she was referred to an IOP, which she did not participate in. Courtney Knox states that he recognizes marked changes in his wife's memory and ability to manage tasks she previously had control over, such as their household's bills. She has demonstrated ambivalence about her treatment since her diagnosis and has been reminded about her autonomy in terms of her medical decision-making. Amber shared insight into Ms. Cathell's personal history with cancer through the experiences of her family members, including her father, who died of lung cancer, and her older brother, of whom Ms. Chiarella was the healthcare representative for throughout his treatment and ultimate death from lymphoma. Most recently, her mother died of Alzheimer's Dementia after living with the disease for a few years. Both Solicitor note Ms. Felix's anxiety in relation to her own diagnosis and treatment, but feel that her approach to her care diverges greatly from the person they otherwise know her to be. Courtney Knox shared about Ms. Twichell's time serving in the [employer] and her pursuit of training as a Music therapist. He and Amber describe her as strong-willed and as someone who doesn't take 'no' for an answer. I spoke at length with both Courtney Knox and Courtney Knox about factors they feel might be contributing to Ms. Aldea's current presentation. Courtney Knox alluded to issues with pain control and management of side effects related to her treatment. Most distressing to the family is Ms. Toutant's recent struggles with appetite and her drastic weight loss over the course of three weeks, despite the team's positive feedback about her response to treatment and their goals of care. Courtney Knox shared that he and Hospital doctor are hopeful that medical marijuana can be a helpful component to her care and inquired about the certification process. I offered education on the palliative care service, including their philosophy of care and expertise in symptom management. I explained that their team also has a psycho-oncology component that Ms. Losano would likely benefit from connecting with. Courtney Knox is agreeable to a palliative care referral at this time, with an initial goal to  address unmanaged symptoms, as he feels this is what Ms. Lordi will be most motivated to pursue. In the meantime, he will work to ensure her medication compliance after she was started on Remeron for appetite and mood last week. We discussed the option of a homecare referral for additional support, but Courtney Knox declines this service right now, understanding that they can access it if they change their mind in the future. He is aware of my ongoing availability for support and assistance surrounding any emerging needs and has my direct contact information to access my assistance between appointments.  Collaborations husband, Merchant navy officer,  daughter, Hospital doctor,  thoracic oncology team ([name], MD,  Legrand Rams, APRN) Specific referrals to enhance community supports (include existing and new resources) palliative care Handoff Required? No Social Work will take lead to arrange post-acute care services and continue work with the care team as patient progresses towards discharge No Next Steps/Plan (including hand-off): Social work intervention is complete. Please reconsult social work for assistance with any emerging needs. Was the authorized person Charity fundraiser, legal guardian, and/or healthcare representative) notified? Other Signature: Jerral Ralph, LCSW Contact Information: [phone]

## 2023-03-07 ENCOUNTER — Encounter: Admit: 2023-03-07 | Payer: PRIVATE HEALTH INSURANCE | Primary: Internal Medicine

## 2023-03-07 NOTE — Telephone Encounter (Signed)
Faxed and received confirmation

## 2023-03-07 NOTE — Progress Notes
 CURRENT THERAPY: C1D1 Durvalumab given 9/20/24INITIAL STUDIES:06/25/2022 [place] A/P showed findings concerning for colitis predominant involvement of the right colon and cecum.07/01/2022 [place] chest showed RIGHT upper lobe mass with chest wall invasion. Mass measures 5.1 x 3.3 x 4.9 cm. This mass erodes into the posterior chest wall with adjacent erosion of the RIGHT posterior fifth and sixth ribs.07/06/2022 FEV1 37% predicted, FEV1/FVC 35%, DLCO 20%. Severe obstruction, severely impaired diffusing capacity.07/09/2022 MRI brain with no evidence of metastatic disease to the brain.07/16/2022 [facility] PET/[facility] showed intensely hypermetabolic RIGHT upper lobe pleural based lung mass with chest wall and right 5-6th ribs invasion. Biapical hypermetabolic scarring with more prominent on the LEFT apical nodularity, nonspecific but concerning for malignant involvement.07/21/22 Path penAccession #: [id number]        1) LYMPH NODE, 11L, FINE NEEDLE ASPIRATION/WASH:           - NEGATIVE FOR MALIGNANT CELLS      - POLYMORPHOUS POPULATION OF LYMPHOCYTES. 2) LYMPH NODE, STATION 7, FINE NEEDLE ASPIRATION/WASH:           - NEGATIVE FOR MALIGNANT CELLS      - POLYMORPHOUS POPULATION OF LYMPHOCYTES. 3) LYMPH NODE, 11R, FINE NEEDLE ASPIRATION/WASH:           - NEGATIVE FOR MALIGNANT CELLS      - POLYMORPHOUS POPULATION OF LYMPHOCYTES. PAST MEDICAL/ SURGICAL HISTORY:  Colitis, COPD (chronic obstructive pulmonary disease), osteoporosis. Left wrist surgery. 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 Continues to smoke with roughly 40 pack year history.FAMILY HISTORY:  Father, lung cancer; brother, lymphomaMEDICATIONS:  Medication list reviewedALLERGIES:  PCNREVIEW OF SYSTEMS:  As described below and in chart.  Remaining review of systems is negative.[name] appears uncomfortable. Continues to have diffuse pain involving stomach, arms, legs, neck. Poor historian. Unclear if she's had worsening cough or shortness of breath. Appetite/ energy poor. Accompanied by daughter and husband.Imaging without suggestion of new/recurrent/progressive lung cancer discussed. Etiology of diffuse pain and mental status changes unclear. Not interested in following up with psychiatry or visiting home support. I did speak with our social worker who will call her daughter either later today or Monday to further evaluate situation.We agreed to hold immunotherapy. Prescription for Remeron (appetite) and tramadol provided. 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 Hepatitis CImaging without recurrent/ progressive lung cancerBlood work reviewed. Hold consolidation durvalumab today.SW to evaluateRemeron (anorexia)Tramadol for pain

## 2023-03-08 ENCOUNTER — Telehealth: Admit: 2023-03-08 | Payer: PRIVATE HEALTH INSURANCE | Attending: Hematology | Primary: Internal Medicine

## 2023-03-08 ENCOUNTER — Encounter: Admit: 2023-03-08 | Payer: PRIVATE HEALTH INSURANCE | Attending: Hematology | Primary: Internal Medicine

## 2023-03-08 ENCOUNTER — Inpatient Hospital Stay: Admit: 2023-03-08 | Discharge: 2023-03-08 | Payer: BLUE CROSS/BLUE SHIELD | Attending: Emergency Medicine

## 2023-03-08 ENCOUNTER — Encounter: Admit: 2023-03-08 | Payer: PRIVATE HEALTH INSURANCE | Attending: Physician Assistant | Primary: Internal Medicine

## 2023-03-08 DIAGNOSIS — C3490 Malignant neoplasm of unspecified part of unspecified bronchus or lung: Secondary | ICD-10-CM

## 2023-03-08 DIAGNOSIS — R627 Adult failure to thrive: Secondary | ICD-10-CM

## 2023-03-08 NOTE — ED Notes
 2:30 PM Pt with a pmhx of lung cxr presents to the ED, as referred by oncology RN, for pt's husband's concern re her recent cognitive decline, increased agitation, and new onset of decreased food intake for past 6 days. Husband states that the pt has only drank fruit juice over the last 6 days and is refusing any food and has become combative when approached with assistance with ADLs. Husband and daughter at home providing total care for pt. Husband is unclear of his goals of care for the patient at this time. Consult with care coordination and social work offered, husband accepted. At this time the pt is sitting in a wheelchair, refusing to move to stretcher. Pt yelling at staff when approached to leave her alone. Pt refusing to answer questions at this time. Pt appears extremely weak, cachectic, with dry mucus membranes. Awaiting provider eval. Plan for SW consult.  5:32 PMHusband requesting to be discharged. MD Revonda Standard notified. 6:00 PMPatient ready for discharge. Instructions reviewed with pt's husband. Stated understanding.  Pt refused VS.

## 2023-03-08 NOTE — Telephone Encounter
 Received call from  daughter Chip Boer to speak with care team  states patient has progressively gotten worse since last office visit. Reports dehydration. Weakness, lack of appetite . Amber States her father is home alone with pt and unsure what to do. Call back [phone]

## 2023-03-08 NOTE — Telephone Encounter
 Received call from:  [name]- Husband Called to speak with Irving Burton to review update on Mieke's current health condition. Best telephone number for call back: [phone]

## 2023-03-08 NOTE — Telephone Encounter
 RTC to pt's husband and daughter.  Discussion about pt not eating since last Friday, refusing to let any change her diapers or being combative when anyone goes near her.  Pt is also not taking her medications.  Discussed with Dr. Maxcine Ham.  Pt will need to go to ED for medical assessment and psychiatric evaluation.  Husband asking for feeding tube and I told him that we typically do not do that for our cancer patients.  PA putting in referral to ED.

## 2023-03-08 NOTE — Discharge Instructions
 You may take Motrin (also called Advil, Ibuprofen) every 6 hours or Tylenol (Acetaminophen) every 4 hours as needed for pain or fever. Please alternate giving these medications (for example: give Motrin first, if there's still no relief after 30 minutes, give Tylenol). Please do not take more than 4 doses each of these medications in a 24 hour period or you can develop severe complications from these medications.Please follow up with your primary care doctor. Read package inserts for all medications prior to taking them.Return to the ED for new or worsening symptoms including fever >100.4 F, shortness of breath, and chest pain.

## 2023-03-08 NOTE — Telephone Encounter
 Received 2nd call from patients husband. Called requesting to speak with [facility] Call back [phone]

## 2023-03-09 ENCOUNTER — Encounter: Admit: 2023-03-09 | Payer: PRIVATE HEALTH INSURANCE | Primary: Internal Medicine

## 2023-03-09 NOTE — Care Coordination-Inpatient
 [facility] accepted Patient with [facility] tomorrow, Spoke with Husband who is aware

## 2023-03-09 NOTE — Telephone Encounter
 Recvd call from [place]. They went to the ER. Offered to admit her for oxygen but they saw no point in staying there.  She really wanted to come home and he wouldn't leave her.He called his insurance,  they participate w hospice- he is asking for help to set that up.

## 2023-03-09 NOTE — Progress Notes
 SOCIAL WORK NOTEPatient Name: Janei Lilla Record Number: [id number] Date of Birth: 1957/06/15Medical Social Work Follow Up  [employer] Most Recent Value Admission Information  Document Type Progress Note Reason for Current Social Work Involvement Insurance underwriter, Medical Team Family/Caregiver Comment spouse, Education administrator Comment thoracic oncology Record Reviewed Yes Level of Care Ambulatory What medium(s) of communication were used with patient/family/caregiver? Telephone Psychosocial issues requiring intervention caregiver support Psychosocial interventions 10 minutes were spent in direct contact by phone with Ms. Takendra Knieriem's husband, [name], after I received notification from the thoracic oncology team that Ms. Carsten has been referred for home hospice services following her recent presentation to the ED. [name] spoke openly about the circumstances leading their family to have Ms. Mccubbins evaluated at the hospital, as well as the factors contributing to their decision to have her sign on to hospice services. I cited our recent conversation about accessing support through palliative care, but [name] shared that at this point in time, Ms. Tweet is unwilling to speak with anyone. I offered empathic listening and supportive counseling to [facility] surrounding this decision and their family's support of Ms. Meise in her clear wish not to pursue additional treatment, despite some uncertainty about what may be contributing to her wishes. Per [name], [facility] will be coming to their home tomorrow to complete the intake process. He expressed appreciation for my outreach and support today and indicated that he would like to follow-up with me by phone tomorrow after he meets with hospice. He is aware of my ongoing availability for support and assistance surrounding any emerging needs. Collaborations husband, [name],  thoracic oncology Specific referrals to enhance community supports (include existing and new resources) none Handoff Required? No Social Work will take lead to arrange post-acute care services and continue work with the care team as patient progresses towards discharge No Next Steps/Plan (including hand-off): Social work intervention is complete. Please reconsult social work for assistance with any emerging needs. Was the authorized person Charity fundraiser, legal guardian, and/or healthcare representative) notified? Other Signature: Jerral Ralph, LCSW Contact Information: [phone]

## 2023-03-09 NOTE — Plan of Care
 Problem: Adult Inpatient Plan of CareGoal: Readiness for Transition of CareOutcome: Interventions implemented as appropriate Plan of Care Overview/ Patient StatusCase Management Screening and Evaluation  Flowsheet Row Most Recent Value Case Management Screening: Chart review completed. If YES to any question below then proceed to CM Eval/Plan  Is there a change in their cognitive function Yes Do you anticipate that the pt will have any discharge needs requiring CM intervention? Yes Has there been an unscheduled readmission within the last 30 days and/or four (4) encounters (encounters include: ED, OBS, Inpatient) within the last six (6) months? No Negative/Positive Screen Positive Screening: Complete CM Evaluation and Plan Case manager will take lead to arrange post-acute care services and continue to work with the care team as the patient progresses towards discharge Yes Case Manager Attestation  I have reviewed the medical record and completed the above screen. CM staff will follow patient's progress and discuss the plan of care with the Treatment Team. Yes Case Management Evaluation and Plan  Arrived from prior to admission home/apartment/condo Do you have a caregiver, or do you anticipate the need for a caregiver given the change in your physicial function? Yes Lives with Spouse Patient Requires Care Coordination Intervention Due To Discharge planning needs/concerns Documented Insurance Accurate Yes Patient's home address verified Yes Patient's PCP of record verified Yes Source of Clinical History  Patient's clinical history has been reviewed and source of Information is: [employer] fast streamer, Medical Record Case Manager Attestation  I have reviewed the medical record and completed the above evaluation with the following recommendations. Yes Discharge Planning Coordination Recommendations  Discharge Planning Coordination Recommendations Home with Hospice Home Care Case Manager reviewed plan of care/ continuum of care need's with  Interdisciplinary Team, Family  67 year old female with history of NSCLC s/p radiation and chemotherapy, COPD, tobacco use, presenting with failure to thrive. Patient has not eaten in > 1 week and has lost over 20 lbs. She has been declining since July, has had poor memory and is unable to make decisions on her own. Patient's husband is her primary care giver, confirms that she is DNR/DNI. He is hoping to initiate hospice as she is declining and he no longer sees the benefit of medical treatment. She has not had any falls or trauma. Provider advised that she will talk to Husband to discuss Home Hospice vs Inpatient Hospice,once discussed, Case Manager to see Patient. Subsequently Provider advise Husband requesting Home Hospice. Discussed with Provider that Patient is Hypoxia requiring New Oxygen which will be arranged by Home Hospice . Due to the lateness of the hour ( 6 PM Provider provided notification of this ), we will not be able to complete recommending that Patient stay overnight to secured a safe discharge plan of Home Hospice VNA with New Oxygen . Provider advised Patient and Family wants to leave now, CM commented that you may need to issue an AMA notice. Additionally spoke with Attending MD regarding above with MD advised that Patient eloped.This Writer never met with Patient or Husband, Per Chart Review, Patient resides with Husband in a Home, Husband is Primary Caregiver who attends to all of the Patient's needs. Referral placed to [facility], pending response. Care Manger to follow in the am..

## 2023-03-10 ENCOUNTER — Encounter: Admit: 2023-03-10 | Payer: PRIVATE HEALTH INSURANCE | Primary: Internal Medicine

## 2023-03-10 NOTE — Progress Notes
 Oncology Care Manager follow up note:[x] Reached out to the patient's husband in follow-up to the start of home hospice services.[x]  HIPAA compliant voice mail left requesting a return call. Contact information provided.Johnnette Gourd MSN, RNOncology Care CoordinatorSmilow Outpatient ~ Thoracic & TorringtonCell: [phone]

## 2023-03-10 NOTE — Progress Notes
 Oncology Care Manager New Patient Referral NoteOncology Care Manager was notified via:[x] Telephone  [] In-person   [x]  In-basket  []  OtherReferral received from: Jerral Ralph [facility] for Referral: A message was received, and a call was placed to [facility], as it appears that a home hospice referral was placed while the patient was at the ED. Per [place], she spoke with the patient's husband, and [facility] is scheduled to see the patient tomorrow. Discussion held that this writer will also make contact with the patient's husband to discuss the [facility] and inquire if he is comfortable in waiting for the evaluation, or if he would like this writer to attempt to make it sooner.This Clinical research associate spoke with the patient's husband, and he advised that he thinks that waiting until tomorrow will be alright because she's just laying there. Per the patient's husband, the patient is reluctant and/or refusing to let him touch/move her, and that he continues to make attempts to provide hands on care. Emotional support provided. The patient's husband is amenable to continued phone outreach by this writer, and understands that contact will be made with him later in the day on Friday, 12/13, in follow-up to the hospice SOC.Johnnette Gourd MSN, RNOncology Care CoordinatorSmilow Outpatient ~ Thoracic & TorringtonCell: [phone]

## 2023-03-10 NOTE — ED Provider Notes
 Chief Complaint Patient presents with  Fatigue   Pt sent by [facility] for decreased appetite,weight loss x1 week. PMH lung cancer, immunotherapy discontinued. Pt emaciated looking. HPI/PE:67 year old female with history of NSCLC s/p radiation and chemotherapy, COPD, tobacco use, presenting with failure to thrive. Patient has not eaten in > 1 week and has lost over 20 lbs. She has been declining since July, has had poor memory and is unable to make decisions on her own. Patient's husband is her primary care giver, confirms that she is DNR/DNI. He is hoping to initiate hospice as she is declining and he no longer sees the benefit of medical treatment. She has not had any falls or trauma. Patient is angry, yells get away from me. She is thin and frail. Hypoxic to 89% with increased work of breathing. Case management will help arrange home hospice as patient's husband prefers to continue to care for her at home.5:36 PM Case management will be able to help arrange home hospice referral. Patient's husband also is having conversations with Palliative care outpatient. Patient and husband are requesting discharge at this time.Patient stable for discharge home with close follow up with primary care physician. Plan of care discussed with patient and education provided. Patient encouraged to return to ED if they develop new or worsening symptoms.Lindley Magnus, MDPGY-3, [facility] External data reviewed: Notes (OSH or non-ED)Directly spoke with:  Social Work/Case Management  Physical ExamED Triage Vitals [03/08/23 1420]BP: 100/65Pulse: 63Pulse from  O2 sat: n/aResp: 18Temp: (!) 96.6 ?F (35.9 ?C)Temp src: n/aSpO2: (!) 89 % BP 100/65  - Pulse 63  - Temp (!) 96.6 ?F (35.9 ?C)  - Resp 18  - SpO2 (!) 89% Physical ExamVitals and nursing note reviewed. Constitutional:     General: She is not in acute distress.   Appearance: She is not ill-appearing.    Comments: Thin, frail, smells of cigarette smoke HENT:    Head: Normocephalic and atraumatic.    Right Ear: External ear normal.    Left Ear: External ear normal.    Mouth/Throat:    Mouth: Mucous membranes are dry. Eyes:    Conjunctiva/sclera: Conjunctivae normal. Cardiovascular:    Rate and Rhythm: Regular rhythm. Pulmonary:    Effort: Pulmonary effort is normal. No respiratory distress.    Breath sounds: No stridor. No wheezing. Abdominal:    General: There is no distension.    Palpations: Abdomen is soft.    Tenderness: There is no abdominal tenderness. There is no guarding or rebound. Musculoskeletal:       General: No deformity. Skin:   General: Skin is warm and dry.    Coloration: Skin is not pale.    Findings: No erythema or rash. Neurological:    General: No focal deficit present.    Mental Status: She is alert. Mental status is at baseline. Psychiatric:       Mood and Affect: Affect is angry.       Behavior: Behavior is agitated.       Thought Content: Thought content normal.  ProceduresAttestation/Critical CarePatient Reevaluation: Attending Supervised: ResidentI saw and examined the patient. I agree with the findings and plan of care as documented in the resident's note except as noted below. Additional acute and/or chronic problems addressed: Wende Bushy [phone] w history of long CA, s/p chemo/radio, presents w failure to thrive, has not had food in over a week, has lost 20lbs in past weeks. Case management to see patient in ED to figure out resources for  patient. Inpatient vs outpatient vs home hospice. ___________I received signout. Plan was for patient to speak with care management. Discussed case over phone with case management who relayed concerns but patient and husband left the ED/eloped prior to my opportunity to assess / discuss plan of care with patient.Reichen Hutzler ChenComments as of 03/09/23 2122 Wed Mar 08, 2023 1732 Hx lung CA s/p chemoradiation and therapy, COPD / tobacco use, combative at home. Husband unable to care for her. Speaking with care management for hospice outpatient. Declined workup. Husband makes medical decisions. [AC]  Comments User Index[AC] Lorenza Burton, MD   Clinical Impressions as of 03/09/23 2122 Failure to thrive in adult Chronic obstructive pulmonary disease, unspecified COPD type [facility] Code) Hypoxia  ED DispositionEloped  Awilda Metro, MDResident12/11/24 1703 Awilda Metro, MDResident12/11/24 1739 Lorenza Burton, MD12/12/24 2122

## 2023-03-13 ENCOUNTER — Encounter: Admit: 2023-03-13 | Payer: PRIVATE HEALTH INSURANCE | Primary: Internal Medicine

## 2023-03-14 ENCOUNTER — Other Ambulatory Visit (HOSPITAL_BASED_OUTPATIENT_CLINIC_OR_DEPARTMENT_OTHER): Payer: Self-pay

## 2023-03-14 MED ORDER — GABAPENTIN 300 MG PO CAPS
300.0000 mg | ORAL_CAPSULE | Freq: Three times a day (TID) | ORAL | 0 refills | Status: DC
  Filled 2023-03-14: qty 90, 30d supply, fill #0

## 2023-03-14 NOTE — Progress Notes
 Oncology Care Manager follow up note:[x] Reached out to the patient's husband to follow up on the start of home hospice services through [facility]. The patient's husband advised that the services began, but that she passed on 04-10-23 evening. Emotional support provided, and the patient's husband was made aware of the continuation of bereavement support through the agency for a period of 13 months under Medicare's hospice benefit. He verbalized understanding. A message was sent to the team to advise, and Patient Identity was made aware.Johnnette Gourd MSN, RNOncology Care CoordinatorSmilow Outpatient ~ Thoracic & TorringtonCell: [phone]

## 2023-03-16 ENCOUNTER — Other Ambulatory Visit (HOSPITAL_BASED_OUTPATIENT_CLINIC_OR_DEPARTMENT_OTHER): Payer: Self-pay

## 2023-03-17 ENCOUNTER — Ambulatory Visit: Admit: 2023-03-17 | Payer: BLUE CROSS/BLUE SHIELD | Attending: Hematology | Primary: Internal Medicine

## 2023-03-21 ENCOUNTER — Inpatient Hospital Stay: Payer: Medicare Other

## 2023-03-21 ENCOUNTER — Inpatient Hospital Stay: Payer: Medicare Other | Admitting: Oncology

## 2023-03-21 ENCOUNTER — Other Ambulatory Visit (HOSPITAL_BASED_OUTPATIENT_CLINIC_OR_DEPARTMENT_OTHER): Payer: Self-pay

## 2023-03-21 MED ORDER — HYDROMORPHONE HCL 2 MG PO TABS
2.0000 mg | ORAL_TABLET | ORAL | 0 refills | Status: DC | PRN
  Filled 2023-03-21: qty 90, 15d supply, fill #0

## 2023-03-23 ENCOUNTER — Other Ambulatory Visit (HOSPITAL_BASED_OUTPATIENT_CLINIC_OR_DEPARTMENT_OTHER): Payer: Self-pay

## 2023-03-23 MED ORDER — SORBITOL 70 % SOLN
30.0000 mL | 0 refills | Status: DC
  Filled 2023-03-23: qty 473, 2d supply, fill #0

## 2023-03-23 MED ORDER — SENNA 8.6 MG PO TABS
2.0000 | ORAL_TABLET | Freq: Two times a day (BID) | ORAL | 0 refills | Status: DC
  Filled 2023-03-23: qty 100, 25d supply, fill #0

## 2023-03-24 ENCOUNTER — Other Ambulatory Visit (HOSPITAL_BASED_OUTPATIENT_CLINIC_OR_DEPARTMENT_OTHER): Payer: Self-pay

## 2023-03-25 ENCOUNTER — Other Ambulatory Visit: Payer: Self-pay | Admitting: Physician Assistant

## 2023-03-25 ENCOUNTER — Encounter: Admit: 2023-03-25 | Payer: PRIVATE HEALTH INSURANCE | Attending: Hematology | Primary: Internal Medicine

## 2023-03-25 DIAGNOSIS — F063 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, unspecified: Secondary | ICD-10-CM

## 2023-03-27 ENCOUNTER — Other Ambulatory Visit (HOSPITAL_BASED_OUTPATIENT_CLINIC_OR_DEPARTMENT_OTHER): Payer: Self-pay

## 2023-03-27 MED ORDER — MORPHINE SULFATE (CONCENTRATE) 10 MG /0.5 ML PO SOLN
10.0000 mg | ORAL | 0 refills | Status: DC | PRN
  Filled 2023-03-27: qty 30, 10d supply, fill #0

## 2023-03-29 DEATH — deceased

## 2023-03-30 ENCOUNTER — Other Ambulatory Visit (HOSPITAL_BASED_OUTPATIENT_CLINIC_OR_DEPARTMENT_OTHER): Payer: Self-pay

## 2023-03-30 ENCOUNTER — Other Ambulatory Visit (HOSPITAL_COMMUNITY): Payer: Self-pay

## 2023-03-31 ENCOUNTER — Other Ambulatory Visit (HOSPITAL_BASED_OUTPATIENT_CLINIC_OR_DEPARTMENT_OTHER): Payer: Self-pay

## 2023-03-31 MED ORDER — HYDROMORPHONE HCL 2 MG PO TABS
2.0000 mg | ORAL_TABLET | ORAL | 0 refills | Status: DC | PRN
  Filled 2023-03-31 (×4): qty 90, 15d supply, fill #0

## 2023-04-11 ENCOUNTER — Other Ambulatory Visit (HOSPITAL_BASED_OUTPATIENT_CLINIC_OR_DEPARTMENT_OTHER): Payer: Self-pay

## 2023-04-11 MED ORDER — LORAZEPAM 1 MG PO TABS
1.0000 mg | ORAL_TABLET | ORAL | 1 refills | Status: DC | PRN
  Filled 2023-04-11: qty 20, 4d supply, fill #0

## 2023-04-11 MED ORDER — HALOPERIDOL 5 MG PO TABS
5.0000 mg | ORAL_TABLET | ORAL | 1 refills | Status: DC | PRN
  Filled 2023-04-11: qty 20, 4d supply, fill #0

## 2023-04-29 DEATH — deceased

## 2023-06-01 IMAGING — CT CT CHEST LUNG CANCER SCREENING LOW DOSE W/O CM
1 series · 15 of 33 positions shown, 19 images · non-contrast
Comparison: None.

CLINICAL DATA: Lung cancer screening. Thirty pack-year history
former asymptomatic smoker.



[Series 2: ldct screening <30 bmi · axial · 0.71mm/px · z∈[-350,-75]mm · 15 of 65 slices shown, 19 images]
[im 5/65  mediastinal]
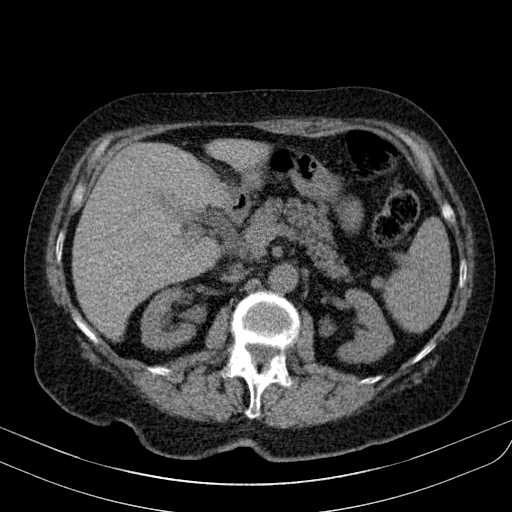
[im 5/65  lung]
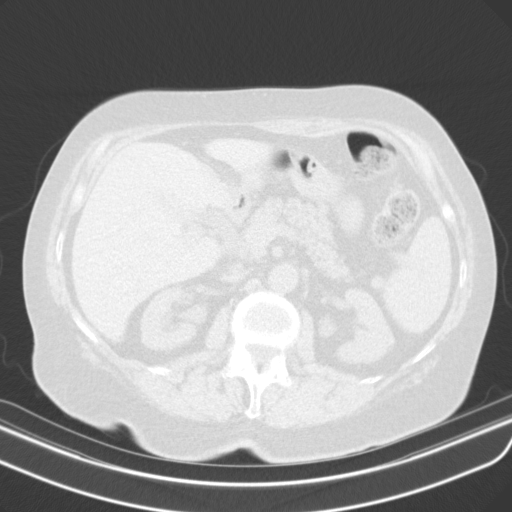
[im 10/65  lung]
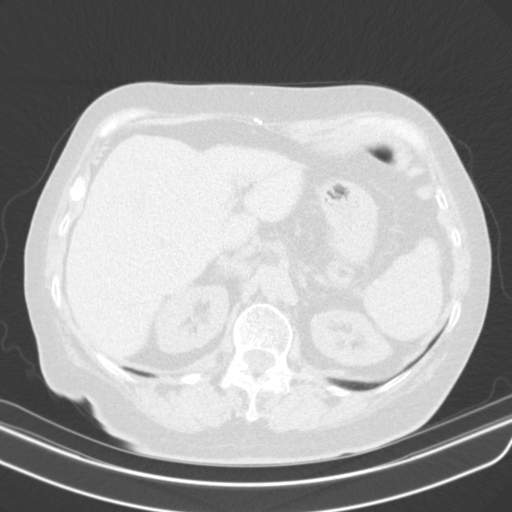
[im 13/65  lung]
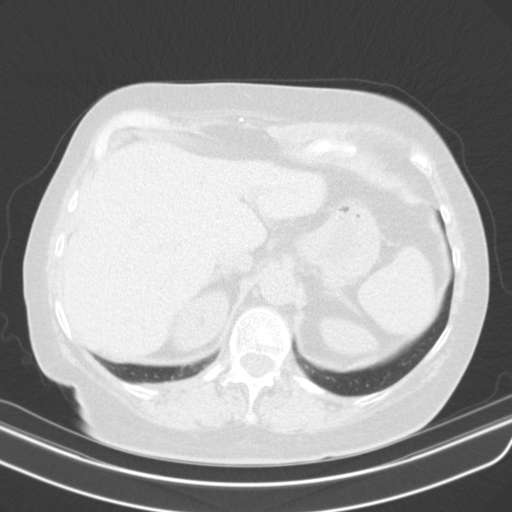
[im 17/65  lung]
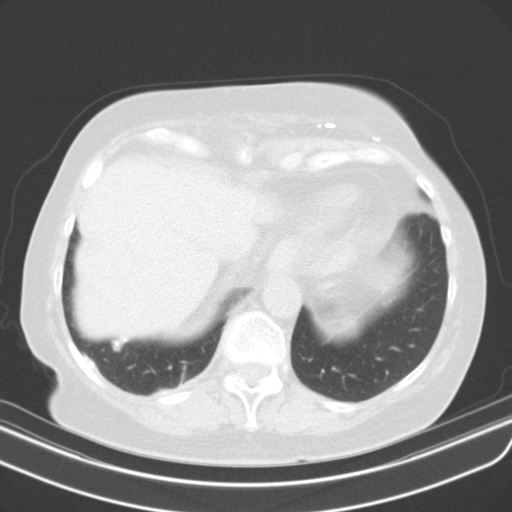
[im 22/65  mediastinal]
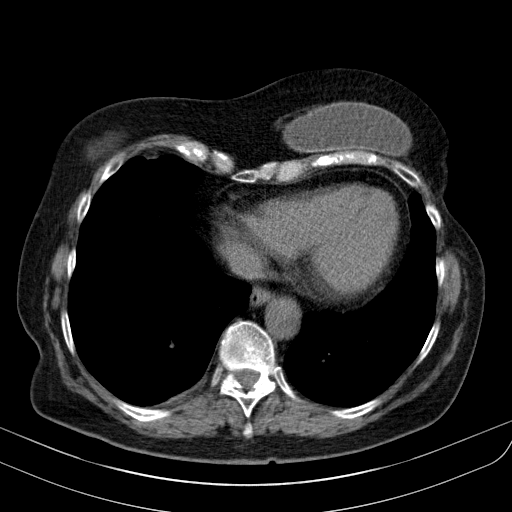
[im 22/65  lung]
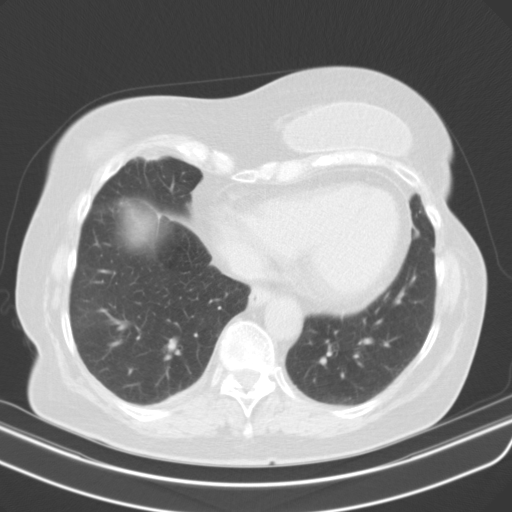
[im 26/65  lung]
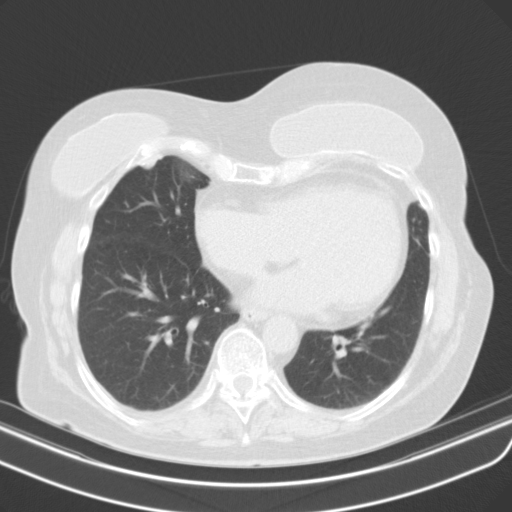
[im 29/65  lung]
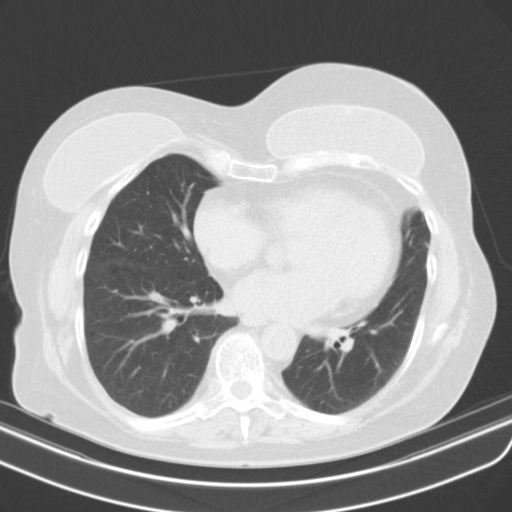
[im 34/65  lung]
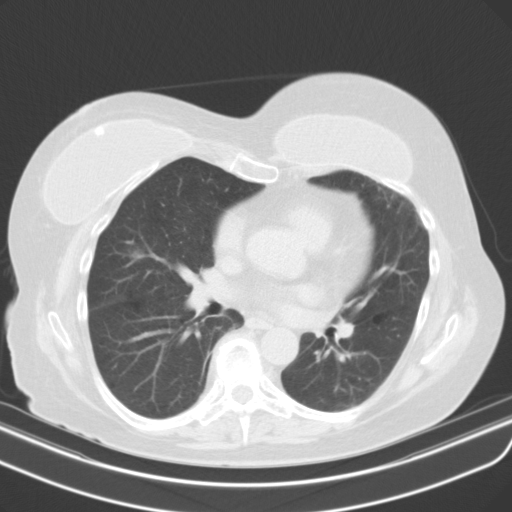
[im 36/65  mediastinal]
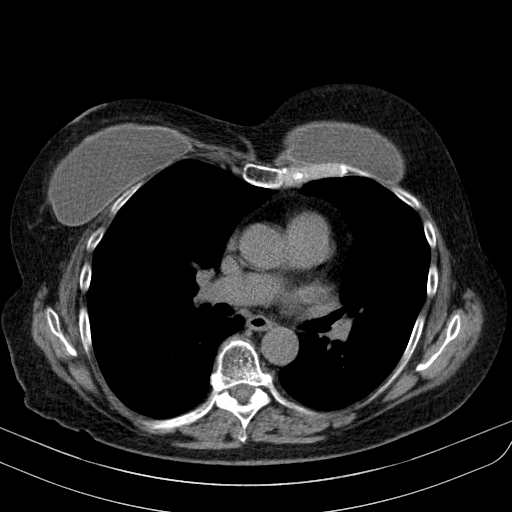
[im 36/65  lung]
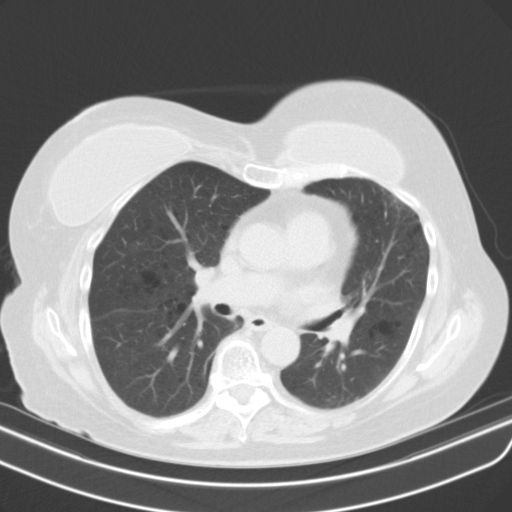
[im 39/65  lung]
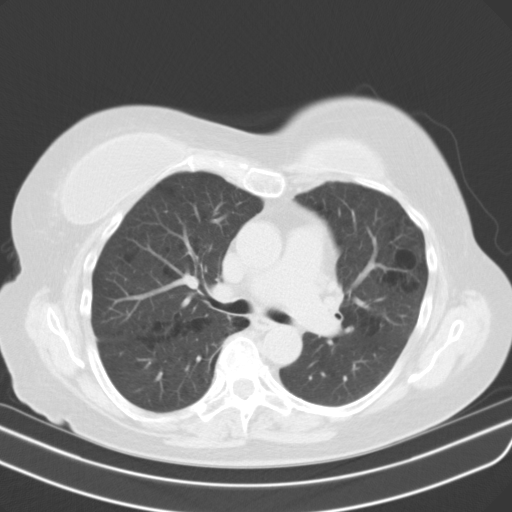
[im 43/65  lung]
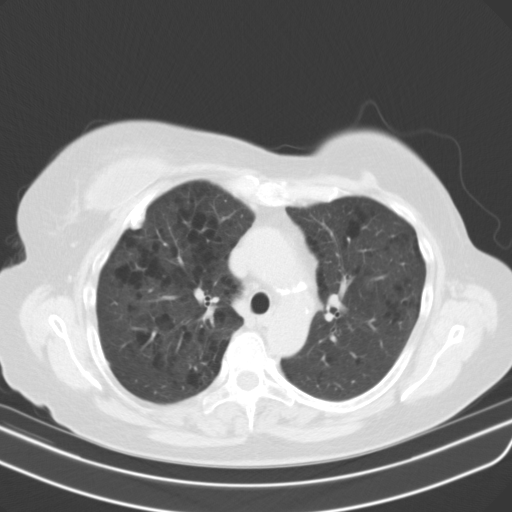
[im 48/65  lung]
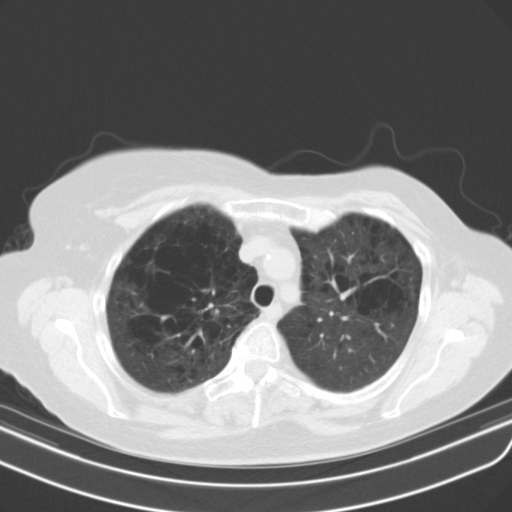
[im 52/65  mediastinal]
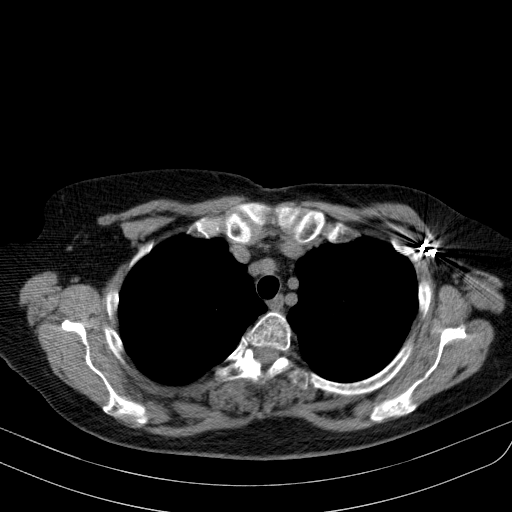
[im 52/65  lung]
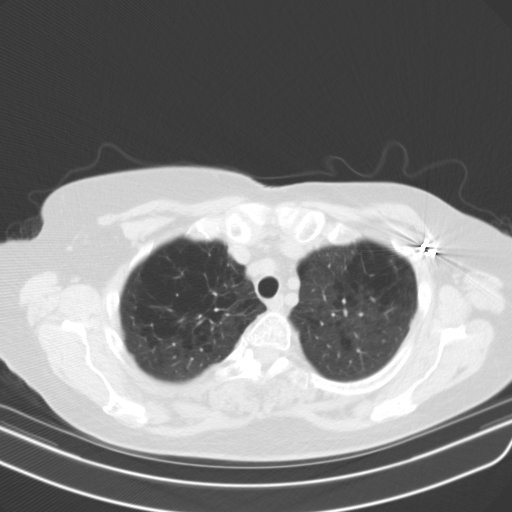
[im 55/65  lung]
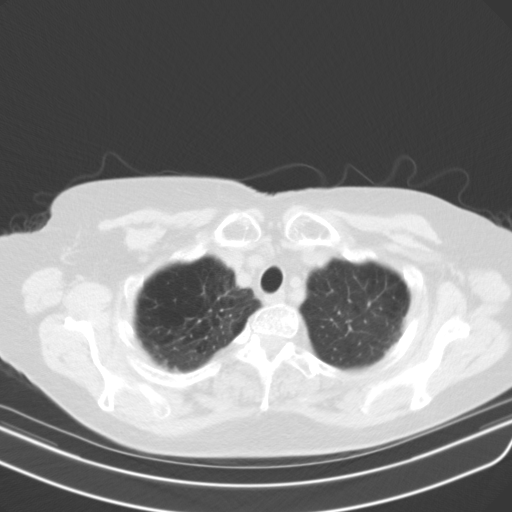
[im 60/65  lung]
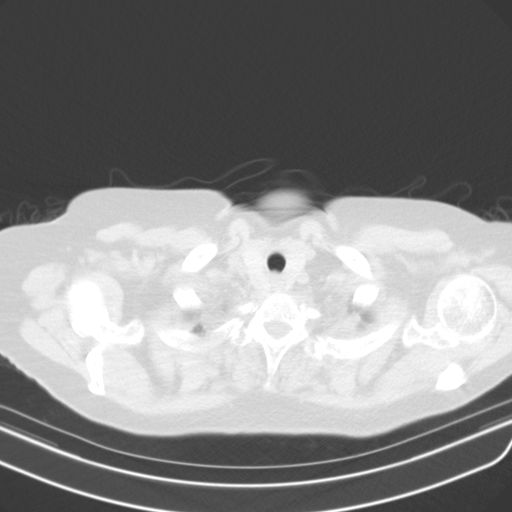

[15 of 33 positions shown; findings below may reference images not displayed]

FINDINGS: Cardiovascular: There is mild cardiac enlargement. No pericardial
effusion identified. Aortic atherosclerosis. No pericardial
effusion.

Mediastinum/Nodes: No enlarged mediastinal, hilar, or axillary lymph
nodes. Thyroid gland, trachea, and esophagus demonstrate no
significant findings.

Lungs/Pleura: Moderate to severe centrilobular and paraseptal
emphysema. Several calcified pleural plaques are noted overlying the
right lobe. No pleural effusion, airspace consolidation, or
pneumothorax. Non solid nodule is identified within the right middle
lobe with a mean derived diameter of 12.4 mm. No additional lung
nodules identified.

Upper Abdomen: No acute abnormality within the imaged portions of
the upper abdomen. Aortic atherosclerosis.

Musculoskeletal: Bilateral augmentation mammoplasty. No acute or
suspicious osseous findings. Chronic appearing compression fracture
is identified involving the T7, T8 and T9 vertebral bodies. This is
most advanced at the T8 level.
IMPRESSION: 1. Lung-RADS 2, benign appearance or behavior. Continue annual
screening with low-dose chest CT without contrast in 12 months.
2. Aortic Atherosclerosis ([id number].M) and Emphysema ([id number].S).

## 2023-06-22 ENCOUNTER — Other Ambulatory Visit: Payer: Self-pay
